# Patient Record
Sex: Male | Born: 1960 | Race: White | Hispanic: No | Marital: Married
Health system: Southern US, Academic
[De-identification: ages and names within clinical notes are randomized; demographics above are authoritative.]

## PROBLEM LIST (undated history)

## (undated) ENCOUNTER — Encounter

## (undated) ENCOUNTER — Encounter: Attending: Adult Health | Primary: Adult Health

## (undated) ENCOUNTER — Telehealth

## (undated) ENCOUNTER — Ambulatory Visit: Payer: PRIVATE HEALTH INSURANCE

## (undated) ENCOUNTER — Ambulatory Visit: Payer: BLUE CROSS/BLUE SHIELD

## (undated) ENCOUNTER — Encounter
Attending: Pharmacist Clinician (PhC)/ Clinical Pharmacy Specialist | Primary: Pharmacist Clinician (PhC)/ Clinical Pharmacy Specialist

## (undated) ENCOUNTER — Ambulatory Visit

## (undated) ENCOUNTER — Encounter: Attending: Hematology | Primary: Hematology

## (undated) ENCOUNTER — Telehealth: Attending: Oncology | Primary: Oncology

## (undated) ENCOUNTER — Telehealth: Attending: Adult Health | Primary: Adult Health

## (undated) ENCOUNTER — Encounter: Attending: Nurse Practitioner | Primary: Nurse Practitioner

## (undated) ENCOUNTER — Telehealth: Attending: Hematology | Primary: Hematology

## (undated) ENCOUNTER — Encounter: Attending: Primary Care | Primary: Primary Care

## (undated) ENCOUNTER — Ambulatory Visit: Payer: MEDICARE | Attending: Hematology | Primary: Hematology

## (undated) ENCOUNTER — Encounter: Attending: Clinical | Primary: Clinical

## (undated) ENCOUNTER — Ambulatory Visit: Payer: MEDICARE

## (undated) ENCOUNTER — Ambulatory Visit: Attending: Clinical | Primary: Clinical

## (undated) ENCOUNTER — Non-Acute Institutional Stay: Payer: PRIVATE HEALTH INSURANCE

## (undated) ENCOUNTER — Ambulatory Visit: Payer: MEDICARE | Attending: Adult Health | Primary: Adult Health

## (undated) ENCOUNTER — Encounter: Attending: Psychiatry | Primary: Psychiatry

## (undated) ENCOUNTER — Telehealth: Attending: Primary Care | Primary: Primary Care

## (undated) ENCOUNTER — Telehealth
Attending: Student in an Organized Health Care Education/Training Program | Primary: Student in an Organized Health Care Education/Training Program

## (undated) ENCOUNTER — Telehealth
Attending: Pharmacist Clinician (PhC)/ Clinical Pharmacy Specialist | Primary: Pharmacist Clinician (PhC)/ Clinical Pharmacy Specialist

## (undated) ENCOUNTER — Ambulatory Visit: Attending: Hematology | Primary: Hematology

## (undated) ENCOUNTER — Encounter: Attending: Oncology | Primary: Oncology

## (undated) ENCOUNTER — Ambulatory Visit: Payer: PRIVATE HEALTH INSURANCE | Attending: Adult Health | Primary: Adult Health

## (undated) ENCOUNTER — Inpatient Hospital Stay: Payer: MEDICARE

## (undated) ENCOUNTER — Encounter: Payer: BLUE CROSS/BLUE SHIELD | Attending: Adult Health | Primary: Adult Health

## (undated) ENCOUNTER — Non-Acute Institutional Stay: Payer: BLUE CROSS/BLUE SHIELD

## (undated) ENCOUNTER — Encounter: Attending: Critical Care Medicine | Primary: Critical Care Medicine

## (undated) ENCOUNTER — Ambulatory Visit: Payer: PRIVATE HEALTH INSURANCE | Attending: Hematology | Primary: Hematology

## (undated) ENCOUNTER — Encounter: Attending: Radiation Oncology | Primary: Radiation Oncology

## (undated) ENCOUNTER — Ambulatory Visit
Payer: PRIVATE HEALTH INSURANCE | Attending: Student in an Organized Health Care Education/Training Program | Primary: Student in an Organized Health Care Education/Training Program

## (undated) ENCOUNTER — Encounter: Payer: BLUE CROSS/BLUE SHIELD | Attending: Hematology | Primary: Hematology

## (undated) ENCOUNTER — Encounter: Payer: PRIVATE HEALTH INSURANCE | Attending: Primary Care | Primary: Primary Care

## (undated) ENCOUNTER — Ambulatory Visit: Attending: Family | Primary: Family

## (undated) ENCOUNTER — Telehealth: Attending: Physician Assistant | Primary: Physician Assistant

## (undated) HISTORY — PX: HERNIA REPAIR: SHX51

## (undated) HISTORY — PX: HEMORROIDECTOMY: SUR656

## (undated) MED ORDER — CALCIUM CARBONATE 600 MG CALCIUM (1,500 MG) TABLET: Freq: Two times a day (BID) | ORAL | 0 days

## (undated) MED ORDER — PREGABALIN 75 MG CAPSULE: capsule | 0 refills | 0 days

## (undated) MED ORDER — MULTIVITAMIN TABLET: Freq: Every day | ORAL | 0 days

---

## 2005-03-29 ENCOUNTER — Encounter: Admission: RE | Admit: 2005-03-29 | Discharge: 2005-03-29 | Payer: Self-pay | Admitting: Cardiology

## 2005-04-18 ENCOUNTER — Encounter: Admission: RE | Admit: 2005-04-18 | Discharge: 2005-04-18 | Payer: Self-pay | Admitting: Cardiology

## 2005-11-08 DIAGNOSIS — F411 Generalized anxiety disorder: Secondary | ICD-10-CM | POA: Insufficient documentation

## 2006-05-20 HISTORY — PX: ELBOW SURGERY: SHX618

## 2007-05-13 ENCOUNTER — Emergency Department: Payer: Self-pay | Admitting: Emergency Medicine

## 2008-08-25 ENCOUNTER — Encounter: Admission: RE | Admit: 2008-08-25 | Discharge: 2008-08-25 | Payer: Self-pay | Admitting: Cardiology

## 2009-02-16 ENCOUNTER — Ambulatory Visit: Payer: Self-pay

## 2009-06-23 DIAGNOSIS — M5417 Radiculopathy, lumbosacral region: Secondary | ICD-10-CM | POA: Insufficient documentation

## 2010-03-29 ENCOUNTER — Ambulatory Visit: Payer: Self-pay | Admitting: Urology

## 2010-04-05 ENCOUNTER — Ambulatory Visit: Payer: Self-pay | Admitting: Urology

## 2010-05-20 HISTORY — PX: LUMBAR LAMINECTOMY: SHX95

## 2010-07-07 ENCOUNTER — Emergency Department: Payer: Self-pay | Admitting: Emergency Medicine

## 2011-06-21 LAB — DRUG SCREEN, URINE
Amphetamines, Ur Screen: NEGATIVE (ref ?–1000)
Barbiturates, Ur Screen: NEGATIVE (ref ?–200)
Benzodiazepine, Ur Scrn: POSITIVE (ref ?–200)
Cannabinoid 50 Ng, Ur ~~LOC~~: NEGATIVE (ref ?–50)
Cocaine Metabolite,Ur ~~LOC~~: NEGATIVE (ref ?–300)
MDMA (Ecstasy)Ur Screen: POSITIVE (ref ?–500)
Methadone, Ur Screen: NEGATIVE (ref ?–300)
Opiate, Ur Screen: POSITIVE (ref ?–300)
Phencyclidine (PCP) Ur S: NEGATIVE (ref ?–25)
Tricyclic, Ur Screen: POSITIVE (ref ?–1000)

## 2011-06-21 LAB — CBC
HCT: 49.1 % (ref 40.0–52.0)
HGB: 16.6 g/dL (ref 13.0–18.0)
MCH: 32.3 pg (ref 26.0–34.0)
MCHC: 33.8 g/dL (ref 32.0–36.0)
MCV: 96 fL (ref 80–100)
Platelet: 162 10*3/uL (ref 150–440)
RBC: 5.14 10*6/uL (ref 4.40–5.90)
RDW: 13.3 % (ref 11.5–14.5)
WBC: 7.8 10*3/uL (ref 3.8–10.6)

## 2011-06-21 LAB — URINALYSIS, COMPLETE
Bacteria: NONE SEEN
Bilirubin,UR: NEGATIVE
Blood: NEGATIVE
Glucose,UR: NEGATIVE mg/dL (ref 0–75)
Ketone: NEGATIVE
Leukocyte Esterase: NEGATIVE
Nitrite: NEGATIVE
Ph: 6 (ref 4.5–8.0)
Protein: NEGATIVE
RBC,UR: 3 /HPF (ref 0–5)
Specific Gravity: 1.024 (ref 1.003–1.030)
Squamous Epithelial: NONE SEEN
WBC UR: <1 /HPF (ref 0–5)

## 2011-06-21 LAB — COMPREHENSIVE METABOLIC PANEL
Albumin: 3.8 g/dL (ref 3.4–5.0)
Alkaline Phosphatase: 77 U/L (ref 50–136)
Anion Gap: 8 (ref 7–16)
BUN: 25 mg/dL — ABNORMAL HIGH (ref 7–18)
Bilirubin,Total: 0.2 mg/dL (ref 0.2–1.0)
Calcium, Total: 8.5 mg/dL (ref 8.5–10.1)
Chloride: 105 mmol/L (ref 98–107)
Co2: 29 mmol/L (ref 21–32)
Creatinine: 0.88 mg/dL (ref 0.60–1.30)
EGFR (African American): >60
EGFR (Non-African Amer.): >60
Glucose: 126 mg/dL — ABNORMAL HIGH (ref 65–99)
Osmolality: 289 (ref 275–301)
Potassium: 3.8 mmol/L (ref 3.5–5.1)
SGOT(AST): 23 U/L (ref 15–37)
SGPT (ALT): 24 U/L
Sodium: 142 mmol/L (ref 136–145)
Total Protein: 6.6 g/dL (ref 6.4–8.2)

## 2011-06-21 LAB — PROTIME-INR
INR: 1
Prothrombin Time: 13.5 secs (ref 11.5–14.7)

## 2011-06-21 LAB — ETHANOL
Ethanol %: <0.003 % (ref 0.000–0.080)
Ethanol: <3 mg/dL

## 2011-06-21 LAB — ACETAMINOPHEN LEVEL: Acetaminophen: <2 ug/mL

## 2011-06-21 LAB — TROPONIN I: Troponin-I: <0.02 ng/mL

## 2011-06-22 ENCOUNTER — Inpatient Hospital Stay: Payer: Self-pay | Admitting: Psychiatry

## 2011-11-22 ENCOUNTER — Emergency Department (HOSPITAL_COMMUNITY)
Admission: EM | Admit: 2011-11-22 | Discharge: 2011-11-22 | Disposition: A | Discharge status: Home / Self Care | Payer: 59 | Attending: Emergency Medicine | Admitting: Emergency Medicine

## 2011-11-22 ENCOUNTER — Encounter (HOSPITAL_COMMUNITY): Payer: Self-pay | Admitting: *Deleted

## 2011-11-22 DIAGNOSIS — Z0000 Encounter for general adult medical examination without abnormal findings: Secondary | ICD-10-CM | POA: Insufficient documentation

## 2011-11-22 DIAGNOSIS — F1910 Other psychoactive substance abuse, uncomplicated: Secondary | ICD-10-CM

## 2011-11-22 LAB — URINALYSIS, ROUTINE W REFLEX MICROSCOPIC
Bilirubin Urine: NEGATIVE
Glucose, UA: NEGATIVE mg/dL
Ketones, ur: NEGATIVE mg/dL
Leukocytes, UA: NEGATIVE
Nitrite: NEGATIVE
Protein, ur: NEGATIVE mg/dL
Specific Gravity, Urine: 1.024 (ref 1.005–1.030)
Urobilinogen, UA: 0.2 mg/dL (ref 0.0–1.0)
pH: 6 (ref 5.0–8.0)

## 2011-11-22 LAB — BASIC METABOLIC PANEL
BUN: 16 mg/dL (ref 6–23)
Calcium: 9.3 mg/dL (ref 8.4–10.5)
Chloride: 104 mEq/L (ref 96–112)
Creatinine, Ser: 0.82 mg/dL (ref 0.50–1.35)
GFR calc Af Amer: >90 mL/min (ref >90)
GFR calc non Af Amer: >90 mL/min (ref >90)
Glucose, Bld: 107 mg/dL — ABNORMAL HIGH (ref 70–99)
Potassium: 3.8 mEq/L (ref 3.5–5.1)
Sodium: 141 mEq/L (ref 135–145)

## 2011-11-22 LAB — RAPID URINE DRUG SCREEN, HOSP PERFORMED
Amphetamines: NOT DETECTED
Barbiturates: NOT DETECTED
Benzodiazepines: POSITIVE — AB

## 2011-11-22 LAB — ETHANOL: Alcohol, Ethyl (B): <11 mg/dL (ref 0–11)

## 2011-11-22 LAB — CBC
HCT: 50 % (ref 39.0–52.0)
MCHC: 35.6 g/dL (ref 30.0–36.0)
MCV: 94.5 fL (ref 78.0–100.0)
Platelets: 215 10*3/uL (ref 150–400)
RBC: 5.29 MIL/uL (ref 4.22–5.81)
RDW: 12.8 % (ref 11.5–15.5)
WBC: 11.4 10*3/uL — ABNORMAL HIGH (ref 4.0–10.5)

## 2011-11-22 LAB — URINE MICROSCOPIC-ADD ON

## 2011-11-22 MED ORDER — ZOLPIDEM TARTRATE 5 MG PO TABS: 5.0000 mg | ORAL_TABLET | Freq: Every evening | ORAL | Status: DC | PRN

## 2011-11-22 MED ORDER — ONDANSETRON HCL 8 MG PO TABS: 4.0000 mg | ORAL_TABLET | Freq: Three times a day (TID) | ORAL | Status: DC | PRN

## 2011-11-22 MED ORDER — ACETAMINOPHEN 325 MG PO TABS: 650.0000 mg | ORAL_TABLET | ORAL | Status: DC | PRN

## 2011-11-22 MED ORDER — IBUPROFEN 400 MG PO TABS: 600.0000 mg | ORAL_TABLET | Freq: Three times a day (TID) | ORAL | Status: DC | PRN

## 2011-11-22 MED ORDER — NICOTINE 21 MG/24HR TD PT24: 21.0000 mg | MEDICATED_PATCH | Freq: Every day | TRANSDERMAL | Status: DC | PRN

## 2011-11-22 NOTE — ED Notes (Signed)
Patient states he is ready to go home and resume treatment through AA and will attend a meeting this evening. EDP advised and patient now waiting discharge.

## 2011-11-22 NOTE — ED Provider Notes (Signed)
History     CSN: [id number]  Arrival date & time 11/22/11  0213   First MD Initiated Contact with Patient 11/22/11 [phone]      Chief Complaint  Patient presents with  . medical clearance     (Consider location/radiation/quality/duration/timing/severity/associated sxs/prior treatment) HPI Comments: Cameron Bennett is a 51 y.o. Male presents with family members who are encouraging him to get help for use of illicit substances. Patient admits to using Xanax and opiates that he gets off from friends. He denies SI or HI. Tonight he got in an argument with his wife, place, were called to the house; after which he agreed to come to the hospital for evaluation. He has not recently been in detox program. He was to continue working. He will continue a short term treatment program. He does not know the amounts of benzos or opiates, that he uses. He denies weakness, dizziness, nausea, vomiting, cough, shortness of breath, or chest pain.  The history is provided by the patient.    History reviewed. No pertinent past medical history.  History reviewed. No pertinent past surgical history.  No family history on file.  History  Substance Use Topics  . Smoking status: Current Everyday Smoker  . Smokeless tobacco: Not on file  . Alcohol Use: Yes      Review of Systems  All other systems reviewed and are negative.    Allergies  Penicillins and Sulfa antibiotics  Home Medications   Current Outpatient Rx  Name Route Sig Dispense Refill  . ALPRAZOLAM 2 MG PO TABS Oral Take 2 mg by mouth 3 (three) times daily as needed. For anxiety    . EXCEDRIN PO Oral Take 1 tablet by mouth every 6 (six) hours as needed. For pain    . IBUPROFEN 200 MG PO TABS Oral Take 200 mg by mouth every 6 (six) hours as needed. For pain      BP 133/94  Pulse 96  Temp 98.2 F (36.8 C) (Oral)  Resp 18  SpO2 95%  Physical Exam  Nursing note and vitals reviewed. Constitutional: He is oriented to person, place,  and time. He appears well-developed and well-nourished.  HENT:  Head: Normocephalic and atraumatic.  Right Ear: External ear normal.  Left Ear: External ear normal.  Eyes: Conjunctivae and EOM are normal. Pupils are equal, round, and reactive to light.  Neck: Normal range of motion and phonation normal. Neck supple.  Cardiovascular: Normal rate, regular rhythm, normal heart sounds and intact distal pulses.   Pulmonary/Chest: Effort normal. He exhibits no bony tenderness.  Abdominal: Normal appearance.  Musculoskeletal: Normal range of motion.  Neurological: He is alert and oriented to person, place, and time. He has normal strength. No cranial nerve deficit or sensory deficit. He exhibits normal muscle tone. Coordination normal.       Slurred speech, consistent with intoxication.  Skin: Skin is warm, dry and intact.  Psychiatric: He has a normal mood and affect. His behavior is normal.    ED Course  Procedures (including critical care time)  Labs Reviewed  URINALYSIS, ROUTINE W REFLEX MICROSCOPIC - Abnormal; Notable for the following:    Hgb urine dipstick SMALL (*)     All other components within normal limits  URINE RAPID DRUG SCREEN (HOSP PERFORMED) - Abnormal; Notable for the following:    Opiates POSITIVE (*)     Benzodiazepines POSITIVE (*)     All other components within normal limits  CBC - Abnormal; Notable for the following:  WBC 11.4 (*)     Hemoglobin 17.8 (*)     All other components within normal limits  BASIC METABOLIC PANEL - Abnormal; Notable for the following:    Glucose, Bld 107 (*)     All other components within normal limits  URINE MICROSCOPIC-ADD ON  ETHANOL  LAB REPORT - SCANNED   No results found.   1. Polysubstance abuse       MDM  Polysubstance abuse.        Flint Melter, MD 11/30/11 [phone]
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
## 2011-11-22 NOTE — ED Notes (Signed)
The pt says he is here for a drug screen.  He took a xanax and he  Says his family wanted him to go to behavorial health.  There seems to be more to the story the pt is not telling

## 2011-11-22 NOTE — ED Notes (Signed)
Patient is sleeping at present 

## 2011-11-22 NOTE — ED Provider Notes (Signed)
Pt seen and examined in Pod C, vitals are stable.  Pt without complaints this morning, awaiting disposition per ACT team.   12:22 PM  Pt now requesting to leave ED.  He is here voluntarily.  He states he has an AA meeting tonight at 8pm that he will attend.  Discharged with strict return precautions.    Ethelda Chick, MD 11/22/11 [phone]
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
## 2011-11-26 ENCOUNTER — Ambulatory Visit: Payer: Self-pay | Admitting: Unknown Physician Specialty

## 2011-11-26 LAB — DRUG SCREEN, URINE

## 2011-12-19 ENCOUNTER — Ambulatory Visit: Payer: Self-pay | Admitting: Unknown Physician Specialty

## 2012-01-02 LAB — RAPID URINE DRUG SCREEN, HOSP PERFORMED
Amphetamines, Ur Screen: NEGATIVE (ref ?–1000)
Barbiturates, Ur Screen: NEGATIVE (ref ?–200)
Benzodiazepine, Ur Scrn: NEGATIVE (ref ?–200)
Cannabinoid 50 Ng, Ur ~~LOC~~: NEGATIVE (ref ?–50)
Cocaine Metabolite,Ur ~~LOC~~: NEGATIVE (ref ?–300)
Opiate, Ur Screen: NEGATIVE (ref ?–300)

## 2012-01-19 ENCOUNTER — Ambulatory Visit: Payer: Self-pay | Admitting: Unknown Physician Specialty

## 2013-05-01 ENCOUNTER — Ambulatory Visit: Payer: Self-pay | Admitting: Family Medicine

## 2013-05-22 ENCOUNTER — Ambulatory Visit: Payer: Self-pay | Admitting: Family Medicine

## 2013-08-12 ENCOUNTER — Ambulatory Visit: Payer: Self-pay | Admitting: Family Medicine

## 2014-03-28 ENCOUNTER — Ambulatory Visit: Payer: Self-pay | Admitting: Gastroenterology

## 2014-03-28 LAB — HM COLONOSCOPY
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
## 2014-09-11 NOTE — Discharge Summary (Signed)
PATIENT NAME:  Cameron Bennett, Cameron Bennett MR#:  [id number] DATE OF BIRTH:  22-Jun-1960  DATE OF ADMISSION:  06/22/2011 DATE OF DISCHARGE:  06/24/2011  HISTORY OF PRESENT ILLNESS: Mr. Casey Fye is a 54 year old male who has had great difficulty with substance dependence. His wife has been very distressed about his condition. He was showing some delirium symptoms on February 2nd. He acknowledged that he had been using opioids to the point that he had been getting hydrocodone off the streets. This had progressed from his initial treatment with opioids for pain secondary to his back surgery in April of 2012.   His wife brought him to the Emergency Room. When he was first assessed, he was displaying incoherence and poor concentration with poor judgment, slurred speech, restlessness, and anxious affect.   ANCILLARY CLINICAL DATA: Mr. Almond underwent a head CT which was negative. He also underwent a chest x-ray which was negative.   His urine drug screen was positive for benzodiazepines, opiates, MDMA, as well as TCA. His urinalysis was negative. Chest CT was unremarkable. Aspirin negative. Tylenol negative. Ethanol negative. His complete metabolic panel was unremarkable. His CBC was unremarkable.   HOSPITAL COURSE: Mr. Daywalt was admitted to the Inpatient Behavioral Unit for further evaluation and treatment of opioid dependence as well as anxiety. He had been having insomnia as well as muscle tension and feeling on edge. He had been receiving benzodiazepines. Trazodone was started at 50 mg at bedtime which was not resolving the insomnia. The undersigned increased it to 100 mg.   Mr. Pasternak quickly overcame his thought disorganization and confusion by the time the admitting physician assessed him. He did not require any treatment for opioid withdrawal. He was continued on his Chantix 1 mg b.i.d. to prevent return to smoking cigarettes. He was also kept on his bupropion 150 mg b.i.d. Mr. Lehigh reported that  the Bupropion and Chantix were being used to prevent his return to cigarette usage.   CONDITION ON DISCHARGE: By February 4th, Mr. Bostwick is showing normal mood, interest, energy, concentration, and appetite. He is behaving normally within the milieu and is attending groups. He does report some residual insomnia after 50 mg of trazodone at bedtime and is interested in increasing the trazodone.   MENTAL STATUS EXAM UPON DISCHARGE: Mr. Rosato is a well developed, well nourished middle-aged male with normal gait. He has no cachexia. He has normal muscle tone. He has normal grooming and hygiene.   Eye contact is good. Concentration is normal. He is oriented to all spheres. Memory is intact to immediate, recent, and remote except for the blackout during the delirium. His use of language, intelligence, and fund of knowledge are normal. Abstraction is normal. Speech involves normal rate and prosody without dysarthria.   Thought process is logical, coherent, and goal directed without looseness of associations. There are no tangents. Thought content no thoughts of harming himself, no thoughts of harming others. No delusions. No hallucinations. Insight is intact. Judgment is intact. Mood within normal limits. Affect broad and appropriate.   DISCHARGE DIAGNOSES:  AXIS I:  1. Anxiety disorder, not otherwise specified.  2. Delirium secondary to polysubstance intoxication, resolved.  3. Opioid dependence.  4. Nicotine dependence.   AXIS II: Deferred.   AXIS III:  1. History of hernia repair. 2. Carpal tunnel syndrome repair, right side. 3. History of back surgery.   AXIS IV: Primary support group, general medical.   AXIS V: 55.   Mr. Brockbank is not at risk to harm  himself or others. He agrees to call emergency services for any thoughts of harming himself, thoughts of harming others, or distress.   DIET: Regular.   ACTIVITY: Routine.   DISCHARGE MEDICATIONS: The indications, alternatives, and  adverse effects of trazodone were reviewed with Mr. Pillsbury including the risk of priapism resulting in surgery-induced impotence. He understands and wants to continue the trazodone with an increase to 100 mg at bedtime for anti-insomnia as well as antianxiety. The undersigned will prescribe trazodone 100 mg 1 p.o. at bedtime, dispense #12, enough to carry him over to his Intensive Outpatient Program. He agrees to not drive if drowsy.   He has a supply of his bupropion 150 mg b.i.d. as well as his Chantix 1 mg b.i.d.   FOLLOW-UP APPOINTMENTS: [facility] to start Intensive Outpatient Program in the morning.   Twelve-step method and groups.  ____________________________ Drue Stager. Ahsha Hinsley, MD [id number] D: 06/24/2011 17:38:11 ET T: 06/25/2011 12:07:45 ET JOB#: [id number]  cc: Drue Stager. Tedi Hughson, MD, <Dictator> Billie Ruddy MD ELECTRONICALLY SIGNED 07/05/2011 20:00

## 2014-10-11 ENCOUNTER — Other Ambulatory Visit: Payer: Self-pay | Admitting: Family Medicine

## 2014-10-11 ENCOUNTER — Ambulatory Visit
Admission: RE | Admit: 2014-10-11 | Discharge: 2014-10-11 | Disposition: A | Discharge status: Home / Self Care | Payer: BLUE CROSS/BLUE SHIELD | Source: Ambulatory Visit | Attending: Family Medicine | Admitting: Family Medicine

## 2014-10-11 DIAGNOSIS — R042 Hemoptysis: Secondary | ICD-10-CM

## 2014-10-11 DIAGNOSIS — J449 Chronic obstructive pulmonary disease, unspecified: Secondary | ICD-10-CM | POA: Insufficient documentation

## 2014-10-11 DIAGNOSIS — R05 Cough: Secondary | ICD-10-CM | POA: Diagnosis present

## 2014-10-11 DIAGNOSIS — R059 Cough, unspecified: Secondary | ICD-10-CM

## 2014-10-13 ENCOUNTER — Other Ambulatory Visit: Payer: Self-pay | Admitting: Family Medicine

## 2014-10-13 DIAGNOSIS — R042 Hemoptysis: Secondary | ICD-10-CM

## 2014-10-19 ENCOUNTER — Ambulatory Visit
Admission: RE | Admit: 2014-10-19 | Discharge: 2014-10-19 | Disposition: A | Discharge status: Home / Self Care | Payer: BLUE CROSS/BLUE SHIELD | Source: Ambulatory Visit | Attending: Family Medicine | Admitting: Family Medicine

## 2014-10-19 DIAGNOSIS — R042 Hemoptysis: Secondary | ICD-10-CM | POA: Diagnosis not present

## 2014-10-19 MED ORDER — IOHEXOL 350 MG/ML SOLN
75.0000 mL | Freq: Once | INTRAVENOUS | Status: AC | PRN
  Administered 2014-10-19: 75 mL via INTRAVENOUS

## 2014-10-20 ENCOUNTER — Telehealth: Payer: Self-pay | Admitting: Family Medicine

## 2014-10-20 NOTE — Telephone Encounter (Signed)
Pt called to requset results from CT scan.  CB#[id number]

## 2014-10-20 NOTE — Telephone Encounter (Signed)
Pt called to results of CT scan.  CB#[phone]/[id number]

## 2014-10-21 NOTE — Telephone Encounter (Signed)
No significant abnormality on chest CT scan. Schedule follow up in 2-3 weeks for spirometry.

## 2014-10-21 NOTE — Telephone Encounter (Signed)
Patient advised as directed below. Patient verbalized understanding. Patient scheduled a follow up appointment for spirometry. NW

## 2014-11-02 ENCOUNTER — Other Ambulatory Visit: Payer: Self-pay

## 2014-11-02 DIAGNOSIS — G8929 Other chronic pain: Secondary | ICD-10-CM | POA: Insufficient documentation

## 2014-11-02 DIAGNOSIS — Z87442 Personal history of urinary calculi: Secondary | ICD-10-CM | POA: Insufficient documentation

## 2014-11-02 DIAGNOSIS — M549 Dorsalgia, unspecified: Secondary | ICD-10-CM

## 2014-11-02 DIAGNOSIS — H6980 Other specified disorders of Eustachian tube, unspecified ear: Secondary | ICD-10-CM | POA: Insufficient documentation

## 2014-11-02 DIAGNOSIS — H6990 Unspecified Eustachian tube disorder, unspecified ear: Secondary | ICD-10-CM | POA: Insufficient documentation

## 2014-11-02 DIAGNOSIS — Z9889 Other specified postprocedural states: Secondary | ICD-10-CM | POA: Insufficient documentation

## 2014-11-02 DIAGNOSIS — E669 Obesity, unspecified: Secondary | ICD-10-CM | POA: Insufficient documentation

## 2014-11-02 DIAGNOSIS — F1911 Other psychoactive substance abuse, in remission: Secondary | ICD-10-CM | POA: Insufficient documentation

## 2014-11-02 DIAGNOSIS — G479 Sleep disorder, unspecified: Secondary | ICD-10-CM | POA: Insufficient documentation

## 2014-11-02 DIAGNOSIS — E291 Testicular hypofunction: Secondary | ICD-10-CM | POA: Insufficient documentation

## 2014-11-04 ENCOUNTER — Encounter: Payer: Self-pay | Admitting: Family Medicine

## 2014-11-04 ENCOUNTER — Ambulatory Visit (INDEPENDENT_AMBULATORY_CARE_PROVIDER_SITE_OTHER): Payer: BLUE CROSS/BLUE SHIELD | Admitting: Family Medicine

## 2014-11-04 ENCOUNTER — Other Ambulatory Visit: Payer: Self-pay

## 2014-11-04 VITALS — BP 124/86 | HR 70 | Temp 98.6°F | Resp 18 | Wt 184.8 lb

## 2014-11-04 DIAGNOSIS — J449 Chronic obstructive pulmonary disease, unspecified: Secondary | ICD-10-CM

## 2014-11-04 DIAGNOSIS — J40 Bronchitis, not specified as acute or chronic: Secondary | ICD-10-CM

## 2014-11-04 NOTE — Progress Notes (Signed)
Subjective:    Patient ID: Cameron Bennett, male    DOB: 08-10-1960, 54 y.o.   MRN: [id number]  Chief Complaint  Patient presents with  . Follow-up   HPI Bronchitis greatly improved. CT scan on 10-19-14 did not show masses or significant fibrotic scar. Finished the Doxycycline and Mucinex. Rarely uses the Symbicort now because it made him feel "weird". Still working on smoking cessation by tapering back for now.   Patient Active Problem List   Diagnosis Date Noted  . Back pain, chronic 11/02/2014  . Dysfunction of eustachian tube 11/02/2014  . H/O drug abuse 11/02/2014  . History of surgery to major organs, presenting hazards to health 11/02/2014  . Personal history of urinary calculi 11/02/2014  . 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 Adiposity 11/02/2014  . Dyssomnia 11/02/2014  . 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 Anxiety, generalized 11/08/2005    Past Surgical History  Procedure Laterality Date  . Elbow surgery Right 2008  . Lumbar laminectomy  2012  . Hemorroidectomy    . Hernia repair      History  Substance Use Topics  . Smoking status: Current Every Day Smoker  . Smokeless tobacco: Not on file  . Alcohol Use: No   Family History  Problem Relation Age of Onset  . COPD Mother   . Lung cancer Father   . Diabetes Sister   . Diabetes Brother    Current Outpatient Prescriptions on File Prior to Visit  Medication Sig Dispense Refill  . Aspirin-Acetaminophen-Caffeine (EXCEDRIN PO) Take 1 tablet by mouth every 6 (six) hours as needed. For pain    . gabapentin (NEURONTIN) 300 MG capsule Take 1 capsule by mouth 3 (three) times daily.    Marland Kitchen ibuprofen (ADVIL,MOTRIN) 200 MG tablet Take 200 mg by mouth every 6 (six) hours as needed. For pain    . MULTIPLE VITAMIN PO Take 1 tablet by mouth daily.     No current facility-administered medications on file prior to visit.     Allergies  Allergen Reactions  . Penicillins Swelling  . Sulfa Antibiotics Rash    Review of  Systems  Constitutional: Negative.   HENT: Negative.   Eyes: Negative.   Respiratory: Negative for shortness of breath and wheezing.        Still some cough in the mornings and still smoking.   Cardiovascular: Negative.   Gastrointestinal: Negative.       Objective:   Physical Exam  Constitutional: He is oriented to person, place, and time. He appears well-developed and well-nourished. No distress.  HENT:  Head: Normocephalic and atraumatic.  Right Ear: Hearing normal.  Left Ear: Hearing normal.  Nose: Nose normal.  Eyes: Conjunctivae and lids are normal. Right eye exhibits no discharge. Left eye exhibits no discharge. No scleral icterus.  Pulmonary/Chest: Effort normal. No respiratory distress.  Musculoskeletal: Normal range of motion.  Neurological: He is alert and oriented to person, place, and time.  Skin: Skin is intact. No lesion and no rash noted.  Psychiatric: He has a normal mood and affect. His speech is normal and behavior is normal. Thought content normal.    BP 124/86 mmHg  Pulse 70  Temp(Src) 98.6 F (37 C) (Oral)  Resp 18  Wt 184 lb 12.8 oz (83.825 kg)     Assessment & Plan:  1. Chronic obstructive pulmonary disease, unspecified COPD, unspecified chronic bronchitis type Still smoking but working on cessation program. No significant dyspnea now. Treated bronchitis exacerbation with antibiotic and Symbicort inhaler.  Spirometry reported as normal. CT scan of lungs did not show masses or significant fibrosis. Encouraged to stop all smoking. - Spirometry with graph  2. Bronchitis No wheezing or significant congestion since treatment with Doxycycline, Mucinex and Symbicort inhaler. Normal spirometry. Recheck prn. May stop the Symbicort for now. - Spirometry with graph

## 2015-01-12 ENCOUNTER — Emergency Department
Admission: EM | Admit: 2015-01-12 | Discharge: 2015-01-12 | Disposition: A | Discharge status: Home / Self Care | Payer: BLUE CROSS/BLUE SHIELD

## 2015-01-12 ENCOUNTER — Encounter: Payer: Self-pay | Admitting: Emergency Medicine

## 2015-01-12 ENCOUNTER — Emergency Department: Payer: Worker's Compensation

## 2015-01-12 ENCOUNTER — Emergency Department
Admission: EM | Admit: 2015-01-12 | Discharge: 2015-01-12 | Disposition: A | Discharge status: Home / Self Care | Payer: Worker's Compensation | Attending: Student | Admitting: Student

## 2015-01-12 DIAGNOSIS — Z79899 Other long term (current) drug therapy: Secondary | ICD-10-CM | POA: Insufficient documentation

## 2015-01-12 DIAGNOSIS — Y9389 Activity, other specified: Secondary | ICD-10-CM | POA: Insufficient documentation

## 2015-01-12 DIAGNOSIS — Y990 Civilian activity done for income or pay: Secondary | ICD-10-CM | POA: Diagnosis not present

## 2015-01-12 DIAGNOSIS — S67190A Crushing injury of right index finger, initial encounter: Secondary | ICD-10-CM | POA: Diagnosis not present

## 2015-01-12 DIAGNOSIS — Z880 Allergy status to penicillin: Secondary | ICD-10-CM | POA: Diagnosis not present

## 2015-01-12 DIAGNOSIS — S62609B Fracture of unspecified phalanx of unspecified finger, initial encounter for open fracture: Secondary | ICD-10-CM

## 2015-01-12 DIAGNOSIS — Y9259 Other trade areas as the place of occurrence of the external cause: Secondary | ICD-10-CM | POA: Diagnosis not present

## 2015-01-12 DIAGNOSIS — S6721XA Crushing injury of right hand, initial encounter: Secondary | ICD-10-CM

## 2015-01-12 DIAGNOSIS — S62620B Displaced fracture of medial phalanx of right index finger, initial encounter for open fracture: Secondary | ICD-10-CM | POA: Insufficient documentation

## 2015-01-12 DIAGNOSIS — S61210A Laceration without foreign body of right index finger without damage to nail, initial encounter: Secondary | ICD-10-CM | POA: Diagnosis present

## 2015-01-12 DIAGNOSIS — W231XXA Caught, crushed, jammed, or pinched between stationary objects, initial encounter: Secondary | ICD-10-CM | POA: Diagnosis not present

## 2015-01-12 DIAGNOSIS — Z720 Tobacco use: Secondary | ICD-10-CM | POA: Insufficient documentation

## 2015-01-12 DIAGNOSIS — S61219A Laceration without foreign body of unspecified finger without damage to nail, initial encounter: Secondary | ICD-10-CM

## 2015-01-12 LAB — CBC WITH DIFFERENTIAL/PLATELET
BASOS PCT: 1 %
Basophils Absolute: 0.1 10*3/uL (ref 0–0.1)
Eosinophils Absolute: 0 10*3/uL (ref 0–0.7)
Eosinophils Relative: 1 %
HEMATOCRIT: 47.8 % (ref 40.0–52.0)
HEMOGLOBIN: 16.5 g/dL (ref 13.0–18.0)
LYMPHS ABS: 1.7 10*3/uL (ref 1.0–3.6)
Lymphocytes Relative: 24 %
MCH: 32.5 pg (ref 26.0–34.0)
MCHC: 34.6 g/dL (ref 32.0–36.0)
MCV: 94 fL (ref 80.0–100.0)
MONOS PCT: 8 %
Monocytes Absolute: 0.6 10*3/uL (ref 0.2–1.0)
NEUTROS ABS: 4.7 10*3/uL (ref 1.4–6.5)
NEUTROS PCT: 66 %
Platelets: 139 10*3/uL — ABNORMAL LOW (ref 150–440)
RBC: 5.09 MIL/uL (ref 4.40–5.90)
RDW: 12.7 % (ref 11.5–14.5)
WBC: 7.2 10*3/uL (ref 3.8–10.6)

## 2015-01-12 MED ORDER — LIDOCAINE HCL (PF) 1 % IJ SOLN
5.0000 mL | Freq: Once | INTRAMUSCULAR | Status: AC
  Administered 2015-01-12: 5 mL

## 2015-01-12 MED ORDER — LIDOCAINE HCL (PF) 1 % IJ SOLN
5.0000 mL | Freq: Once | INTRAMUSCULAR | Status: AC
  Administered 2015-01-12: 5 mL
  Filled 2015-01-12: qty 5

## 2015-01-12 MED ORDER — CEPHALEXIN 500 MG PO CAPS: 500.0000 mg | ORAL_CAPSULE | Freq: Four times a day (QID) | ORAL | Status: DC

## 2015-01-12 MED ORDER — CEFAZOLIN SODIUM 1-5 GM-% IV SOLN
1.0000 g | Freq: Once | INTRAVENOUS | Status: AC
  Administered 2015-01-12: 1 g via INTRAVENOUS
  Filled 2015-01-12: qty 50

## 2015-01-12 MED ORDER — LIDOCAINE HCL (PF) 1 % IJ SOLN
INTRAMUSCULAR | Status: AC
  Administered 2015-01-12: 5 mL
  Filled 2015-01-12: qty 5

## 2015-01-12 NOTE — ED Notes (Signed)
Spoke with Pearson Grippe at [facility], informed her that since patient does not have his ID we cannot process the urine drug screen until ID is obtained or a representative from the company can identify him.  A man named Linna Hoff is going to bring patient's ID so that urine drug screen can be completed for [employer].

## 2015-01-12 NOTE — ED Notes (Signed)
Sent over form [place] with laceration to right index finger at work  Open fracture to index finger per staff at [employer] is bandaged on arrival

## 2015-01-12 NOTE — ED Notes (Signed)
Patient informed that a urine sample needs collecting, unable to urinate at this time, reports he just went right before he got here.

## 2015-01-12 NOTE — ED Notes (Signed)
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  Happened at work around 120pm today.  Was given Tetanus and 60mg  of Toradol at [facility].

## 2015-01-12 NOTE — ED Notes (Signed)
Friend states that pt is to be seen at [facility].

## 2015-01-12 NOTE — ED Provider Notes (Signed)
[facility] Emergency Department Provider Note ____________________________________________  Time seen: 1505  I have reviewed the triage vital signs and the nursing notes.  HISTORY  Chief Complaint  Laceration  HPI Cameron Bennett is a 54 y.o. male reports to the ED from [place]., with a work-related crush injury to his right index finger. 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 Today about 120 this afternoon. He did not pull his finger from under the press but instead waited for the cycle through. He noted immediate bleeding and large laceration to the right index finger. He reported to the [facility] where they performed an x-ray, tetanus booster, and 60 mg of Toradol IM. He was then referred here for management of the laceration and open fracture.he rates his pain currently at a 1/10 in triage.  History reviewed. No pertinent past medical history.  Patient Active Problem List   Diagnosis Date Noted  . Back pain, chronic 11/02/2014  . Dysfunction of eustachian tube 11/02/2014  . H/O drug abuse 11/02/2014  . History of surgery to major organs, presenting hazards to health 11/02/2014  . Personal history of urinary calculi 11/02/2014  . 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 Adiposity 11/02/2014  . Dyssomnia 11/02/2014  . 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 Anxiety, generalized 11/08/2005    Past Surgical History  Procedure Laterality Date  . Elbow surgery Right 2008  . Lumbar laminectomy  2012  . Hemorroidectomy    . Hernia repair      Current Outpatient Rx  Name  Route  Sig  Dispense  Refill  . Aspirin-Acetaminophen-Caffeine (EXCEDRIN PO)   Oral   Take 1 tablet by mouth every 6 (six) hours as needed. For pain         . cephALEXin (KEFLEX) 500 MG capsule   Oral   Take 1 capsule (500 mg total) by mouth 4 (four) times daily.   28 capsule   0   . gabapentin  (NEURONTIN) 300 MG capsule   Oral   Take 1 capsule by mouth 3 (three) times daily.         Marland Kitchen gabapentin (NEURONTIN) 400 MG capsule   Oral   Take 1,200 mg by mouth 2 (two) times daily.      2   . ibuprofen (ADVIL,MOTRIN) 200 MG tablet   Oral   Take 200 mg by mouth every 6 (six) hours as needed. For pain         . MULTIPLE VITAMIN PO   Oral   Take 1 tablet by mouth daily.          Allergies Penicillins and Sulfa antibiotics  Family History  Problem Relation Age of Onset  . COPD Mother   . Lung cancer Father   . Diabetes Sister   . Diabetes Brother    Social History Social History  Substance Use Topics  . Smoking status: Current Every Day Smoker  . Smokeless tobacco: None  . Alcohol Use: No   Review of Systems  Constitutional: Negative for fever. Eyes: Negative for visual changes. ENT: Negative for sore throat. Cardiovascular: Negative for chest pain. Respiratory: Negative for shortness of breath. Gastrointestinal: Negative for abdominal pain, vomiting and diarrhea. Genitourinary: Negative for dysuria. Musculoskeletal: Negative for back pain. Crush injury as above Skin: Negative for rash. Neurological: Negative for headaches, focal weakness or numbness. ____________________________________________  PHYSICAL EXAM:  VITAL SIGNS: ED Triage Vitals  Enc Vitals Group     BP 01/12/15 1455 136/81 mmHg     Pulse Rate 01/12/15 1455 94     Resp 01/12/15 1455 20     Temp 01/12/15 1455 98.7 F (37.1 C)     Temp Source 01/12/15 1455 Oral     SpO2 01/12/15 1455 99 %     Weight 01/12/15 1455 174 lb (78.926 kg)     Height 01/12/15 1455 5\' 8"  (1.727 m)     Head Cir --      Peak Flow --      Pain Score 01/12/15 1456 1     Pain Loc --      Pain Edu? --      Excl. in [place]? --    Constitutional: Alert and oriented. Well appearing and in no distress. Eyes: Conjunctivae are normal. PERRL. Normal extraocular movements. ENT   Head: Normocephalic and atraumatic.    Nose: No congestion/rhinnorhea.   Mouth/Throat: Mucous membranes are moist.   Neck: Supple. No thyromegaly. Hematological/Lymphatic/Immunilogical: No cervical lymphadenopathy. Cardiovascular: Normal rate, regular rhythm.  Respiratory: Normal respiratory effort. No wheezes/rales/rhonchi. Gastrointestinal: Soft and nontender. No distention. Musculoskeletal: Nontender with normal range of motion in all extremities.  Neurologic:  Normal gait without ataxia. Normal speech and language. No gross focal neurologic deficits are appreciated. Skin:  Skin is warm, dry and intact. No rash noted. Psychiatric: Mood and affect are normal. Patient exhibits appropriate insight and judgment. ____________________________________________   RADIOLOGY  Francene Finders X-ray (01/12/15) Comminuted fracture to the distal portion of the middle phalanx of the right index finger  I, Ariahna Smiddy, Dannielle Karvonen, personally viewed and evaluated these images (plain radiographs) as part of my medical decision making.  ____________________________________________  LACERATION REPAIR Performed by: Melvenia Needles Authorized by: Melvenia Needles Consent: Verbal consent obtained. Risks and benefits: risks, benefits and alternatives were discussed Consent given by: patient Patient identity confirmed: provided demographic data Prepped and Draped in normal sterile fashion Wound explored  Laceration Location: right index finger  Laceration Length: 4 cm  No Foreign Bodies seen or palpated  Anesthesia: digital block  Local anesthetic: lidocaine 1% w/o epinephrine  Anesthetic total: 4 ml  Irrigation method: syringe Amount of cleaning: standard  Skin closure: 4-0 nylon  Number of sutures: 8  Technique: interrupted  Patient tolerance: Patient tolerated the procedure well with no immediate complications. ________________________________________________________  PROCEDURES  Ceftin 1.5 g IVP Finger  splint ____________________________________________  INITIAL IMPRESSION / ASSESSMENT AND PLAN / ED COURSE  Open, comminuted fracture of the right index finger secondary to crush injury. Suture repair and initial fracture care provided. Spoke with Dr. Rudene Christians, he agrees with plan and will see the patient in the office on Monday.  ____________________________________________  FINAL CLINICAL IMPRESSION(S) / ED DIAGNOSES  Final diagnoses:  Open fracture of finger of right hand, initial encounter  Laceration of finger of right hand with complication, initial encounter  Crushing injury of finger of right hand, initial encounter     Melvenia Needles, PA-C 01/15/15 1958  Joanne Gavel, MD 01/16/15 2352

## 2015-01-12 NOTE — Discharge Instructions (Signed)
Crush Injury, Fingers or Toes A crush injury to the fingers or toes means the tissues have been damaged by being squeezed (compressed). There will be bleeding into the tissues and swelling. Often, blood will collect under the skin. When this happens, the skin on the finger often dies and may slough off (shed) 1 week to 10 days later. Usually, new skin is growing underneath. If the injury has been too severe and the tissue does not survive, the damaged tissue may begin to turn black over several days.  Wounds which occur because of the crushing may be stitched (sutured) shut. However, crush injuries are more likely to become infected than other injuries.These wounds may not be closed as tightly as other types of cuts to prevent infection. Nails involved are often lost. These usually grow back over several weeks.  DIAGNOSIS X-rays may be taken to see if there is any injury to the bones. TREATMENT Broken bones (fractures) may be treated with splinting, depending on the fracture. Often, no treatment is required for fractures of the last bone in the fingers or toes. HOME CARE INSTRUCTIONS   The crushed part should be raised (elevated) above the heart or center of the chest as much as possible for the first several days or as directed. This helps with pain and lessens swelling. Less swelling increases the chances that the crushed part will survive.  Put ice on the injured area.  Put ice in a plastic bag.  Place a towel between your skin and the bag.  Leave the ice on for 15-20 minutes, 03-04 times a day for the first 2 days.  Only take over-the-counter or prescription medicines for pain, discomfort, or fever as directed by your caregiver.  Use your injured part only as directed.  Change your bandages (dressings) as directed.  Keep all follow-up appointments as directed by your caregiver. Not keeping your appointment could result in a chronic or permanent injury, pain, and disability. If there is  any problem keeping the appointment, you must call to reschedule. SEEK IMMEDIATE MEDICAL CARE IF:   There is redness, swelling, or increasing pain in the wound area.  Pus is coming from the wound.  You have a fever.  You notice a bad smell coming from the wound or dressing.  The edges of the wound do not stay together after the sutures have been removed.  You are unable to move the injured finger or toe. MAKE SURE YOU:   Understand these instructions.  Will watch your condition.  Will get help right away if you are not doing well or get worse. Document Released: 05/06/2005 Document Revised: 07/29/2011 Document Reviewed: 09/21/2010 [facility] Patient Information 2015 [place], [place]. This information is not intended to replace advice given to you by your health care provider. Make sure you discuss any questions you have with your health care provider.  Finger Fracture A finger fracture is when one or more bones in the finger break.  HOME CARE   Wear the splint, tape, or cast as long as told by your doctor.  Keep your fingers in the position your doctor tell you to.  Raise (elevate) the injured area above the level of the heart.  Only take medicine as told by your doctor.  Put ice on the injured area.  Put ice in a plastic bag.  Place a towel between the skin and the bag.  Leave the ice on for 15-20 minutes, 03-04 times a day.  Follow up with your doctor.  Ask what exercises you can do when the splint comes off. GET HELP RIGHT AWAY IF:   The fingernails are white or bluish.  You have pain not helped by medicine.  You cannot move your fingertips.  You lose feeling (numbness) in the injured finger(s). MAKE SURE YOU:   Understand these instructions.  Will watch this condition.  Will get help right away if you are not doing well or get worse. Document Released: 10/23/2007 Document Revised: 07/29/2011 Document Reviewed: 10/23/2007 [facility] Patient Information  2015 [place], [place]. This information is not intended to replace advice given to you by your health care provider. Make sure you discuss any questions you have with your health care provider.  Laceration Care, Adult A laceration is a cut that goes through all layers of the skin. The cut goes into the tissue beneath the skin. HOME CARE For stitches (sutures) or staples:  Keep the cut clean and dry.  If you have a bandage (dressing), change it at least once a day. Change the bandage if it gets wet or dirty, or as told by your doctor.  Wash the cut with soap and water 2 times a day. Rinse the cut with water. Pat it dry with a clean towel.  Put a thin layer of medicated cream on the cut as told by your doctor.  You may shower after the first 24 hours. Do not soak the cut in water until the stitches are removed.  Only take medicines as told by your doctor.  Have your stitches or staples removed as told by your doctor. For skin adhesive strips:  Keep the cut clean and dry.  Do not get the strips wet. You may take a bath, but be careful to keep the cut dry.  If the cut gets wet, pat it dry with a clean towel.  The strips will fall off on their own. Do not remove the strips that are still stuck to the cut. For wound glue:  You may shower or take baths. Do not soak or scrub the cut. Do not swim. Avoid heavy sweating until the glue falls off on its own. After a shower or bath, pat the cut dry with a clean towel.  Do not put medicine on your cut until the glue falls off.  If you have a bandage, do not put tape over the glue.  Avoid lots of sunlight or tanning lamps until the glue falls off. Put sunscreen on the cut for the first year to reduce your scar.  The glue will fall off on its own. Do not pick at the glue. You may need a tetanus shot if:  You cannot remember when you had your last tetanus shot.  You have never had a tetanus shot. If you need a tetanus shot and you choose not  to have one, you may get tetanus. Sickness from tetanus can be serious. GET HELP RIGHT AWAY IF:   Your pain does not get better with medicine.  Your arm, hand, leg, or foot loses feeling (numbness) or changes color.  Your cut is bleeding.  Your joint feels weak, or you cannot use your joint.  You have painful lumps on your body.  Your cut is red, puffy (swollen), or painful.  You have a red line on the skin near the cut.  You have yellowish-white fluid (pus) coming from the cut.  You have a fever.  You have a bad smell coming from the cut or bandage.  Your cut  breaks open before or after stitches are removed.  You notice something coming out of the cut, such as wood or glass.  You cannot move a finger or toe. MAKE SURE YOU:   Understand these instructions.  Will watch your condition.  Will get help right away if you are not doing well or get worse. Document Released: 10/23/2007 Document Revised: 07/29/2011 Document Reviewed: 10/30/2010 [facility] Patient Information 2015 [place], [place]. This information is not intended to replace advice given to you by your health care provider. Make sure you discuss any questions you have with your health care provider.   Keep the finger wound and dressing clean, dry, and covered. Take the antibiotic as directed until completely gone.  Follow-up with Dr. Rudene Christians on Monday, as discussed.

## 2015-01-12 NOTE — ED Notes (Signed)
Was given toradol 60 mg and tetanus at [facility]

## 2015-05-12 ENCOUNTER — Ambulatory Visit: Payer: Self-pay | Admitting: Physician Assistant

## 2015-05-25 ENCOUNTER — Ambulatory Visit: Payer: BLUE CROSS/BLUE SHIELD | Admitting: Family Medicine

## 2015-07-05 ENCOUNTER — Encounter: Payer: Self-pay | Admitting: Family Medicine

## 2015-07-05 ENCOUNTER — Ambulatory Visit (INDEPENDENT_AMBULATORY_CARE_PROVIDER_SITE_OTHER): Payer: BLUE CROSS/BLUE SHIELD | Admitting: Family Medicine

## 2015-07-05 VITALS — BP 130/72 | HR 66 | Temp 97.8°F | Resp 16 | Wt 169.6 lb

## 2015-07-05 DIAGNOSIS — J069 Acute upper respiratory infection, unspecified: Secondary | ICD-10-CM

## 2015-07-05 MED ORDER — DOXYCYCLINE HYCLATE 100 MG PO TABS: 100.0000 mg | ORAL_TABLET | Freq: Two times a day (BID) | ORAL | Status: DC

## 2015-07-05 NOTE — Patient Instructions (Signed)
Continue Mucinex Max, Delsym for cough, and Benadryl at night for post nasal drainage. If your sinuses are not improving over the next 3-4 days may start the antibiotic.

## 2015-07-05 NOTE — Progress Notes (Signed)
Subjective:     Patient ID: Cameron Bennett, male   DOB: [birth year], 55 y.o.   MRN: [id number]  HPI  Chief Complaint  Patient presents with  . Cough    Patient comes in office today with concerns of cough and congestion for the past 4 days. Patient states that mucous has went from clear to yellow, he reports fullness in his ears and sinus pressure. Patient reports taking otc Mucinex  States he has cut down smoking while ill to 1/2ppd.   Review of Systems  Constitutional: Negative for fever and chills.  Respiratory: Negative for shortness of breath.        Objective:   Physical Exam  Constitutional: He appears well-developed and well-nourished. No distress.  Ears: T.M's intact without inflammation Sinuses: mild maxillary sinus tenderness Throat: tonsils absent Neck: no cervical adenopathy Lungs: clear     Assessment:    1. Upper respiratory infection - doxycycline (VIBRA-TABS) 100 MG tablet; Take 1 tablet (100 mg total) by mouth 2 (two) times daily.  Dispense: 20 tablet; Refill: 0    Plan:    Continue sx treatment. If sinuses not improving over the next 3-4 days to start abx.

## 2015-08-04 ENCOUNTER — Other Ambulatory Visit: Payer: Self-pay

## 2015-08-04 MED ORDER — DICLOFENAC SODIUM 1 % TD GEL: 2.0000 g | Freq: Four times a day (QID) | TRANSDERMAL | Status: DC

## 2015-08-04 NOTE — Telephone Encounter (Signed)
Patient is requesting a refill on Voltaren Gel be sent to [facility] in [place].

## 2015-08-17 ENCOUNTER — Telehealth: Payer: Self-pay | Admitting: Family Medicine

## 2015-08-17 NOTE — Telephone Encounter (Signed)
Ronalee Belts with [facility] request a call back to discuss prior auth for Voltaren Gel.  [id number]

## 2015-08-18 NOTE — Telephone Encounter (Signed)
Given Aspercreme with Lidocaine (OTC) a try in placed of the Voltaren Gel.

## 2015-08-18 NOTE — Telephone Encounter (Signed)
PA denied see media scanned documentation. What can patient try?-aa

## 2015-08-21 NOTE — Telephone Encounter (Signed)
LMTCB

## 2015-08-31 NOTE — Telephone Encounter (Signed)
LMTCB ED 

## 2016-04-27 IMAGING — CT CT CHEST W/ CM
2 of 3 series · 15 of 36 positions shown, 18 images · IV contrast (omnipaque)
Comparison: CT scan of April 18, 2005.

CLINICAL DATA: Cough with hemoptysis.

EXAM:
CT CHEST WITH CONTRAST
TECHNIQUE: Multidetector CT imaging of the chest was performed during
intravenous contrast administration.
CONTRAST:  75mL OMNIPAQUE IOHEXOL 350 MG/ML SOLN

[Series 2: routine chest with · axial · 0.71mm/px · z∈[-862,-577]mm · 12 of 67 slices shown, 15 images]
[im 5/67  mediastinal]
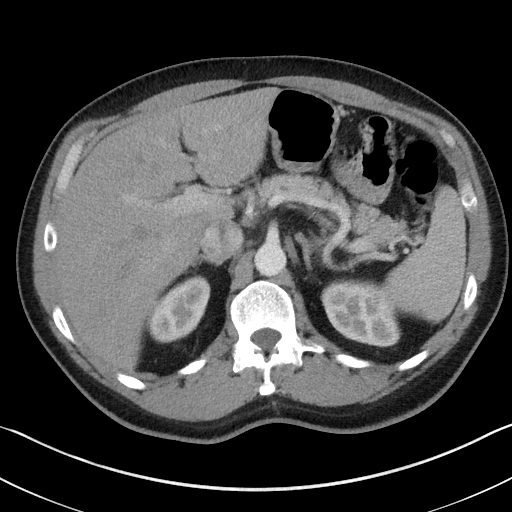
[im 5/67  lung]
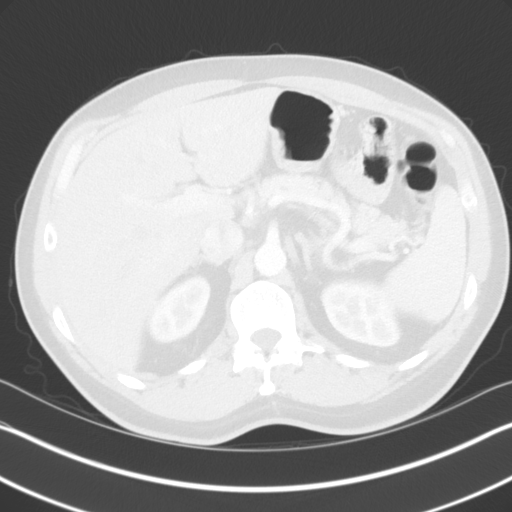
[im 10/67  lung]
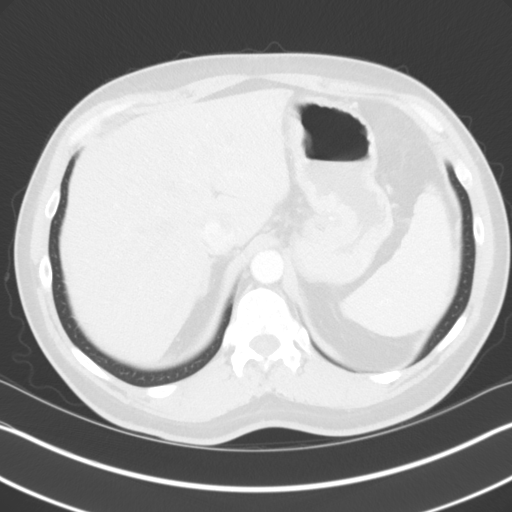
[im 15/67  lung]
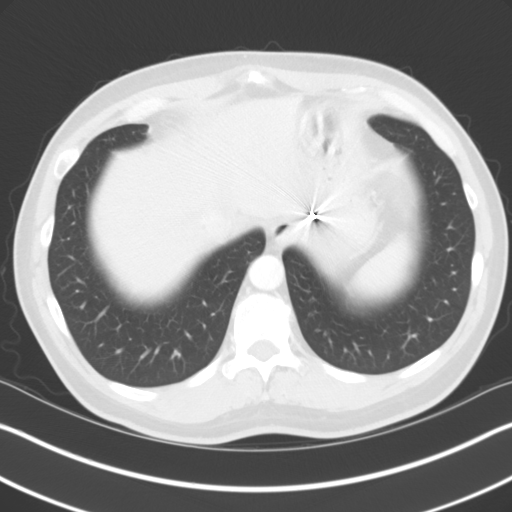
[im 20/67  lung]
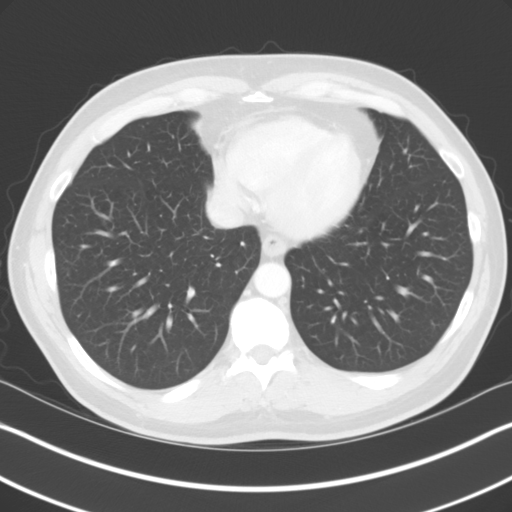
[im 25/67  mediastinal]
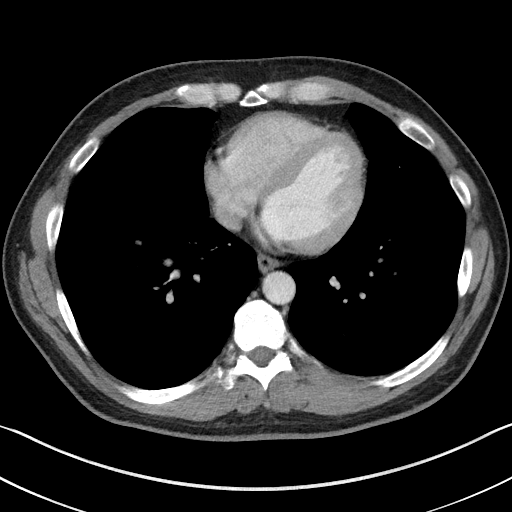
[im 25/67  lung]
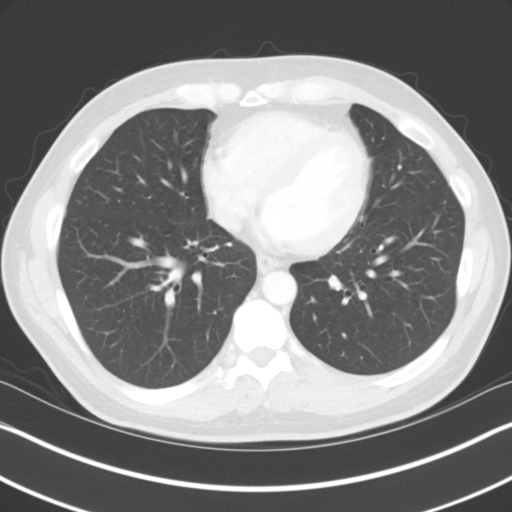
[im 30/67  lung]
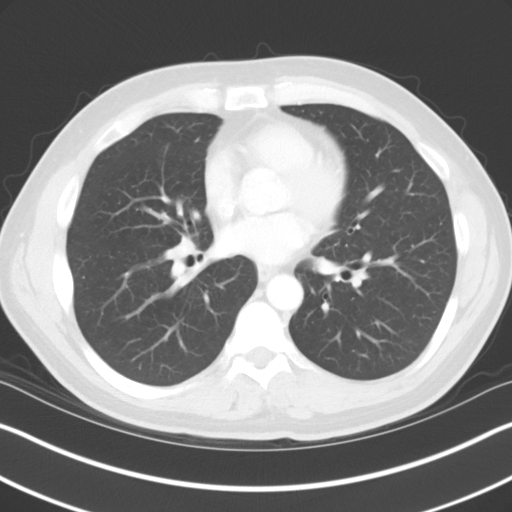
[im 37/67  lung]
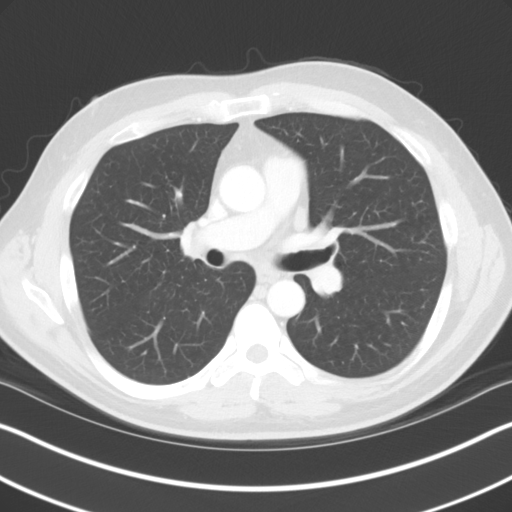
[im 42/67  lung]
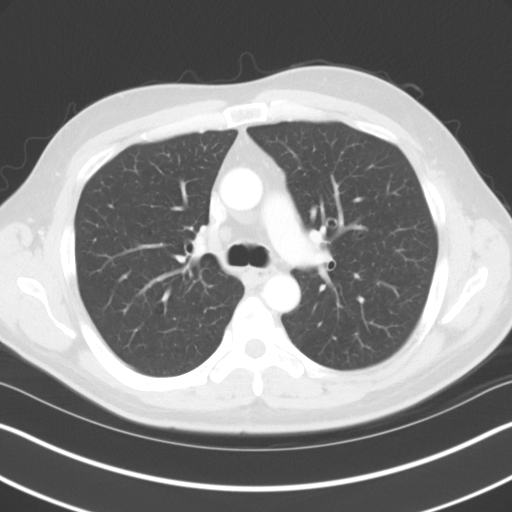
[im 47/67  mediastinal]
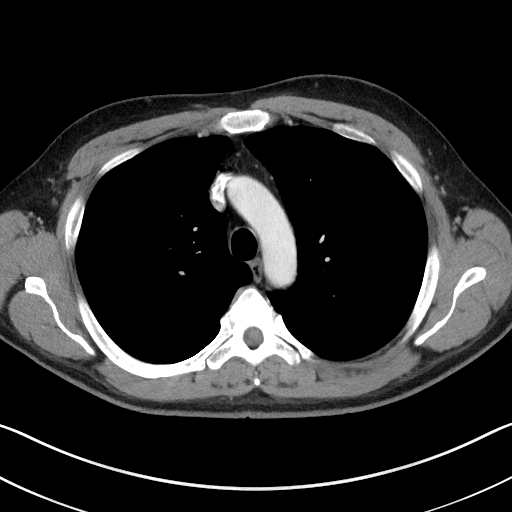
[im 47/67  lung]
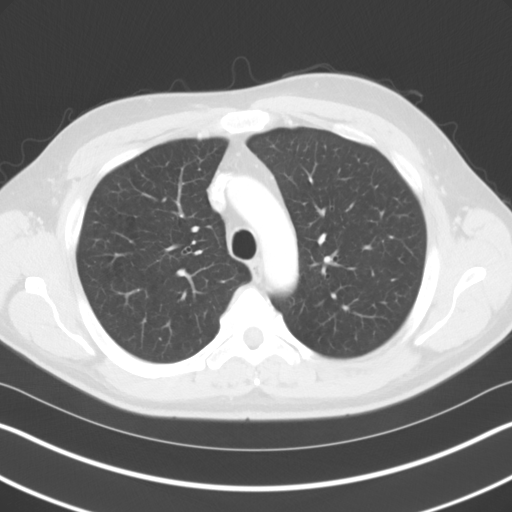
[im 52/67  lung]
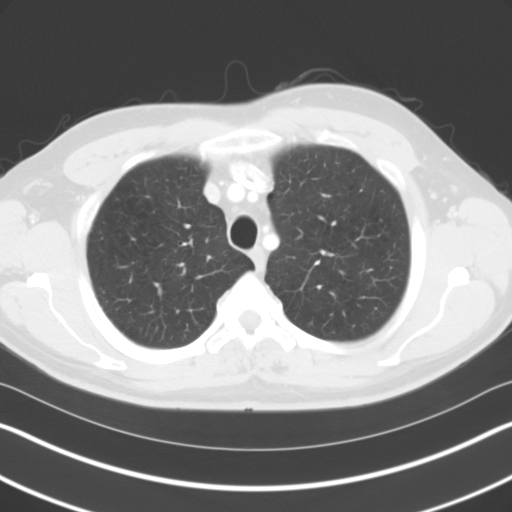
[im 57/67  lung]
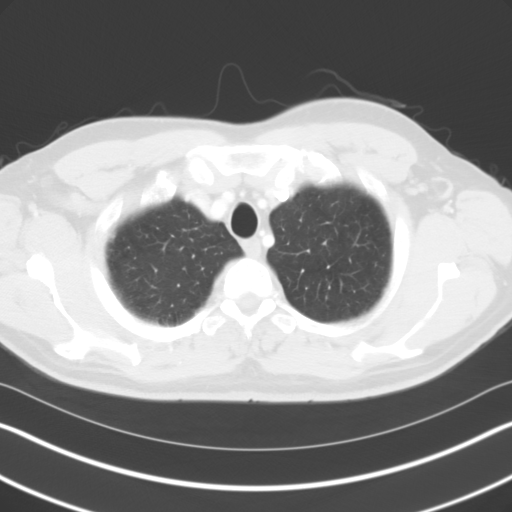
[im 62/67  lung]
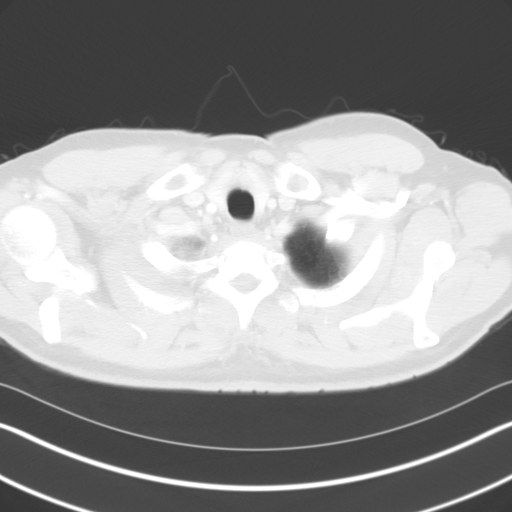

[Series 5: cor routine chest with · coronal · 0.66mm/px · 3 of 141 slices shown]
[im 29/141  lung]
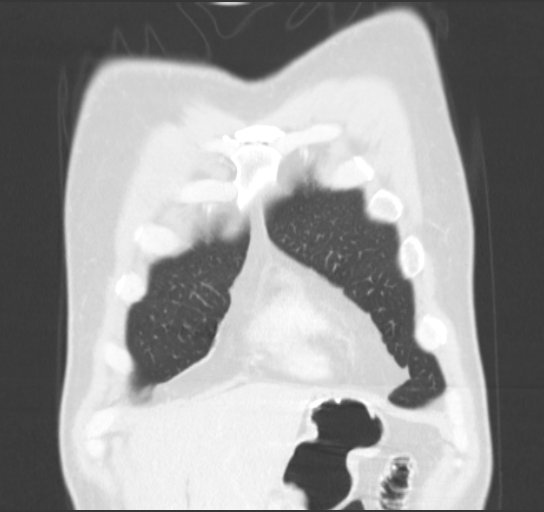
[im 57/141  lung]
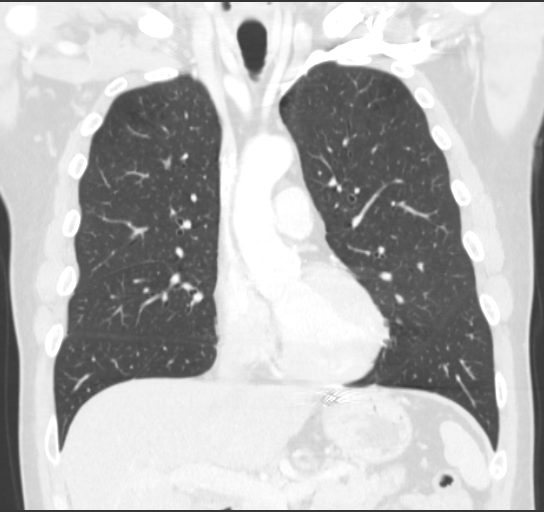
[im 85/141  lung]
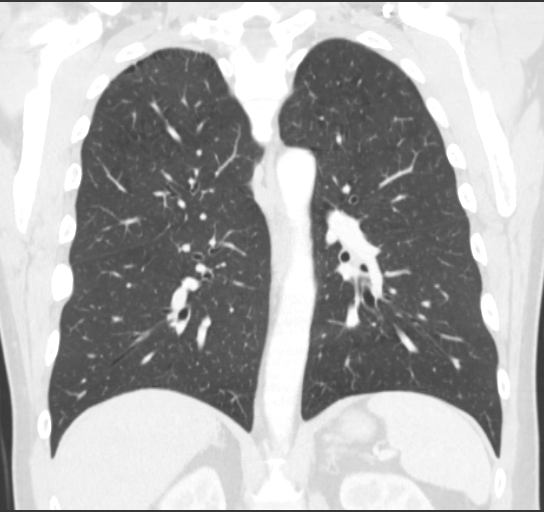

[15 of 36 positions shown; findings below may reference images not displayed]

FINDINGS: No pneumothorax or pleural effusion is noted. No acute pulmonary
disease is noted. No pulmonary nodule or mass is noted. Old right
rib fracture is noted. Visualized portion of upper abdomen is
unremarkable. There is no evidence of thoracic aortic dissection or
aneurysm. Great vessels are widely patent. Stable 17 x 8 mm lymph
node is noted in the aortopulmonary window. Stable 13 x 10 mm
precarinal lymph node is noted. No other mediastinal mass or
adenopathy is noted.
IMPRESSION: No significant abnormality seen in the chest.

## 2016-05-29 ENCOUNTER — Inpatient Hospital Stay: Admission: RE | Admit: 2016-05-29 | Payer: Self-pay | Source: Ambulatory Visit

## 2016-06-06 ENCOUNTER — Ambulatory Visit
Admission: RE | Admit: 2016-06-06 | Discharge: 2016-06-06 | Disposition: A | Discharge status: Home / Self Care | Payer: Managed Care, Other (non HMO) | Source: Ambulatory Visit | Attending: Urology | Admitting: Urology

## 2016-06-06 DIAGNOSIS — D751 Secondary polycythemia: Secondary | ICD-10-CM | POA: Insufficient documentation

## 2016-06-06 LAB — HEMOGLOBIN AND HEMATOCRIT, BLOOD
HCT: 56.9 % — ABNORMAL HIGH (ref 40.0–52.0)
Hemoglobin: 19.3 g/dL — ABNORMAL HIGH (ref 13.0–18.0)

## 2016-06-13 ENCOUNTER — Ambulatory Visit
Admission: RE | Admit: 2016-06-13 | Discharge: 2016-06-13 | Disposition: A | Discharge status: Home / Self Care | Payer: Managed Care, Other (non HMO) | Source: Ambulatory Visit | Attending: Orthopedic Surgery | Admitting: Orthopedic Surgery

## 2016-06-13 DIAGNOSIS — D751 Secondary polycythemia: Secondary | ICD-10-CM | POA: Diagnosis present

## 2016-06-13 LAB — HEMOGLOBIN AND HEMATOCRIT, BLOOD
HCT: 52.4 % — ABNORMAL HIGH (ref 40.0–52.0)
Hemoglobin: 17.4 g/dL (ref 13.0–18.0)

## 2017-01-13 ENCOUNTER — Encounter: Payer: Self-pay | Admitting: Family Medicine

## 2017-01-13 ENCOUNTER — Ambulatory Visit (INDEPENDENT_AMBULATORY_CARE_PROVIDER_SITE_OTHER): Payer: 59 | Admitting: Family Medicine

## 2017-01-13 VITALS — BP 140/88 | HR 76 | Temp 98.4°F | Ht 67.75 in | Wt 191.6 lb

## 2017-01-13 DIAGNOSIS — G8929 Other chronic pain: Secondary | ICD-10-CM

## 2017-01-13 DIAGNOSIS — Z87898 Personal history of other specified conditions: Secondary | ICD-10-CM | POA: Diagnosis not present

## 2017-01-13 DIAGNOSIS — F339 Major depressive disorder, recurrent, unspecified: Secondary | ICD-10-CM

## 2017-01-13 DIAGNOSIS — M5441 Lumbago with sciatica, right side: Secondary | ICD-10-CM | POA: Diagnosis not present

## 2017-01-13 DIAGNOSIS — R631 Polydipsia: Secondary | ICD-10-CM

## 2017-01-13 DIAGNOSIS — G609 Hereditary and idiopathic neuropathy, unspecified: Secondary | ICD-10-CM | POA: Diagnosis not present

## 2017-01-13 DIAGNOSIS — F1911 Other psychoactive substance abuse, in remission: Secondary | ICD-10-CM

## 2017-01-13 NOTE — Progress Notes (Signed)
Patient: Cameron Bennett, Male    DOB: Dec 19, 1960, 56 y.o.   MRN: [id number] Visit Date: 01/13/2017  Today's Provider: Vernie Murders, PA   Chief Complaint  Patient presents with  . Annual Exam   Subjective:    Annual physical exam Cameron Bennett is a 56 y.o. male who presents today for health maintenance and complete physical. He feels well. He reports exercising lightly, but has a  Job that requires lifting and bending. He reports he is sleeping poorly.  -----------------------------------------------------------------   Review of Systems  Constitutional: Negative.   HENT: Negative.   Eyes: Negative.   Respiratory: Positive for cough and wheezing.   Cardiovascular: Negative.   Gastrointestinal: Positive for nausea.  Endocrine: Negative.   Genitourinary: Negative.   Musculoskeletal: Positive for back pain.  Skin: Negative.   Allergic/Immunologic: Negative.   Neurological: Negative.   Hematological: Negative.   Psychiatric/Behavioral: Positive for decreased concentration, dysphoric mood and sleep disturbance. The patient is nervous/anxious.     Social History      He  reports that he has been smoking.  He has never used smokeless tobacco. He reports that he does not drink alcohol or use drugs.       Social History   Social History  . Marital status: Divorced    Spouse name: N/A  . Number of children: N/A  . Years of education: N/A   Social History Main Topics  . Smoking status: Current Every Day Smoker  . Smokeless tobacco: Never Used  . Alcohol use No  . Drug use: No     Comment: quit drug use 06/21/2010  . Sexual activity: Not Asked   Other Topics Concern  . None   Social History Narrative  . None    Patient Active Problem List   Diagnosis Date Noted  . Back pain, chronic 11/02/2014  . Dysfunction of eustachian tube 11/02/2014  . H/O drug abuse 11/02/2014  . History of surgery to major organs, presenting hazards to health 11/02/2014  .  Personal history of urinary calculi 11/02/2014  . 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 Adiposity 11/02/2014  . Dyssomnia 11/02/2014  . 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 Anxiety, generalized 11/08/2005    Past Surgical History:  Procedure Laterality Date  . ELBOW SURGERY Right 2008  . HEMORROIDECTOMY    . HERNIA REPAIR    . LUMBAR LAMINECTOMY  2012    Family History        Family Status  Relation Status  . Mother Deceased  . Father Deceased  . Sister Alive  . Brother Alive        His family history includes COPD in his mother; Diabetes in his brother and sister; Lung cancer in his father.     Allergies  Allergen Reactions  . Penicillins Swelling  . Sulfa Antibiotics Rash    Other reaction(s): Unknown     Current Outpatient Prescriptions:  .  Aspirin-Acetaminophen-Caffeine (EXCEDRIN PO), Take 1 tablet by mouth every 6 (six) hours as needed. For pain, Disp: , Rfl:    Patient Care Team: Zayd Bonet, Vickki Muff, PA as PCP - General (Family Medicine)      Objective:   Vitals: BP 140/88 (BP Location: Right Arm, Patient Position: Sitting, Cuff Size: Normal)   Pulse 76   Temp 98.4 F (36.9 C) (Oral)   Ht 5' 7.75" (1.721 m)   Wt 191 lb 9.6 oz (86.9 kg)   SpO2 97%   BMI 29.35 kg/m  Vitals:   01/13/17 1537  BP: 140/88  Pulse: 76  Temp: 98.4 F (36.9 C)  TempSrc: Oral  SpO2: 97%  Weight: 191 lb 9.6 oz (86.9 kg)  Height: 5' 7.75" (1.721 m)     Physical Exam  Constitutional: He is oriented to person, place, and time. He appears well-developed and well-nourished.  HENT:  Head: Normocephalic and atraumatic.  Right Ear: External ear normal.  Left Ear: External ear normal.  Nose: Nose normal.  Mouth/Throat: Oropharynx is clear and moist.  Eyes: Pupils are equal, round, and reactive to light. Conjunctivae and EOM are normal. Right eye exhibits no discharge.  Neck: Normal range of motion. Neck supple. No tracheal deviation present. No thyromegaly present.    Cardiovascular: Normal rate, regular rhythm, normal heart sounds and intact distal pulses.   No murmur heard. Pulmonary/Chest: Effort normal and breath sounds normal. No respiratory distress. He has no wheezes. He has no rales. He exhibits no tenderness.  Abdominal: Soft. He exhibits no distension and no mass. There is no tenderness. There is no rebound and no guarding.  Musculoskeletal: Normal range of motion. He exhibits no edema or tenderness.  Lymphadenopathy:    He has no cervical adenopathy.  Neurological: He is alert and oriented to person, place, and time. He has normal reflexes. No cranial nerve deficit. He exhibits normal muscle tone. Coordination normal.  Skin: Skin is warm and dry. No rash noted. No erythema.  Psychiatric: His behavior is normal. Judgment and thought content normal. He exhibits a depressed mood.   Depression Screen PHQ 2/9 Scores 01/13/2017  PHQ - 2 Score 4  PHQ- 9 Score 17    Assessment & Plan:     Routine Health Maintenance and Physical Exam  Exercise Activities and Dietary recommendations Goals    Recommend regular walking exercise 30 minutes 3-4 days a week and balanced regular diet.      Immunization History  Administered Date(s) Administered  . Influenza Split 02/04/2009, 04/30/2011, 06/01/2012  . Influenza,inj,Quad PF,6+ Mos 02/22/2014  . Influenza-Unspecified 03/23/2015  . Tdap 01/12/2015    Health Maintenance  Topic Date Due  . HIV Screening  10/03/1977  . INFLUENZA VACCINE  12/18/2016  . COLONOSCOPY  03/28/2024  . TETANUS/TDAP  01/11/2025  . Hepatitis C Screening  Completed     Discussed health benefits of physical activity, and encouraged him to engage in regular exercise appropriate for his age and condition.    -------------------------------------------------------------------- 1. Depression, recurrent ([place]) Feeling sad with spontaneous crying spells, moodiness and anhedonia over the past month. Living alone since getting  divorce (still talks with wife frequently and claims she is his "best friend"). Afraid of most medications with his history of drug and alcohol abuse. Will consider an SSRI if labs are normal. - CBC with Differential/Platelet - Comprehensive metabolic panel  2. Polydipsia Sister and brother diagnosed with DM and he has noticed polyuria, polydipsia, weight loss and peripheral neuropathy in both feet. Will check labs for CBC, CMP and Hgb A1C.  - CBC with Differential/Platelet - Comprehensive metabolic panel - Hemoglobin A1c  3. Idiopathic peripheral neuropathy Has had numbness and tingling/burning in both feet for a couple years. Was tried on Gabapentin up to 900 mg TID by his psychiatrist. Did not feel this helped and is worried about possible diabetes. Will get labs and recheck pending reports. - CBC with Differential/Platelet - Comprehensive metabolic panel - Hemoglobin A1c  4. H/O drug abuse Been off ETOH and opiates/benzos for the past 2-4 years.  Still attends AA meetings and will not try any medication with an addictive potential. Recommend he continue counseling.  5. Chronic right-sided low back pain with right-sided sciatica History of L4-5 lumbar microdiscectomy in 2012 but continues to have back pain with radiculopathy down the right posterior leg to the knee intermittently. Would like to try Meloxicam for inflammation but afraid of any pain medications with his history of drug and alcohol use. Will check labs and consider a trial for a short time if no liver or kidney issues.    Vernie Murders, PA  [facility]

## 2017-01-16 LAB — COMPREHENSIVE METABOLIC PANEL
ALBUMIN: 4.1 g/dL (ref 3.5–5.5)
ALK PHOS: 75 IU/L (ref 39–117)
ALT: 16 IU/L (ref 0–44)
AST: 16 IU/L (ref 0–40)
Albumin/Globulin Ratio: 2.2 (ref 1.2–2.2)
BUN / CREAT RATIO: 19 (ref 9–20)
BUN: 19 mg/dL (ref 6–24)
Bilirubin Total: 0.5 mg/dL (ref 0.0–1.2)
CO2: 24 mmol/L (ref 20–29)
CREATININE: 0.98 mg/dL (ref 0.76–1.27)
Calcium: 9.4 mg/dL (ref 8.7–10.2)
Chloride: 105 mmol/L (ref 96–106)
GFR, EST AFRICAN AMERICAN: 101 mL/min/{1.73_m2} (ref >59)
GFR, EST NON AFRICAN AMERICAN: 87 mL/min/{1.73_m2} (ref >59)
GLOBULIN, TOTAL: 1.9 g/dL (ref 1.5–4.5)
Glucose: 96 mg/dL (ref 65–99)
Potassium: 4.8 mmol/L (ref 3.5–5.2)
SODIUM: 143 mmol/L (ref 134–144)
TOTAL PROTEIN: 6 g/dL (ref 6.0–8.5)

## 2017-01-16 LAB — CBC WITH DIFFERENTIAL/PLATELET
Basophils Absolute: 0 10*3/uL (ref 0.0–0.2)
Basos: 0 %
EOS (ABSOLUTE): 0.1 10*3/uL (ref 0.0–0.4)
EOS: 1 %
HEMATOCRIT: 51.8 % — AB (ref 37.5–51.0)
HEMOGLOBIN: 17.4 g/dL (ref 13.0–17.7)
Immature Grans (Abs): 0 10*3/uL (ref 0.0–0.1)
Immature Granulocytes: 0 %
LYMPHS ABS: 1.9 10*3/uL (ref 0.7–3.1)
Lymphs: 22 %
MCH: 32.7 pg (ref 26.6–33.0)
MCHC: 33.6 g/dL (ref 31.5–35.7)
MCV: 97 fL (ref 79–97)
MONOCYTES: 7 %
Monocytes Absolute: 0.6 10*3/uL (ref 0.1–0.9)
Neutrophils Absolute: 5.9 10*3/uL (ref 1.4–7.0)
Neutrophils: 70 %
Platelets: 197 10*3/uL (ref 150–379)
RBC: 5.32 x10E6/uL (ref 4.14–5.80)
RDW: 14 % (ref 12.3–15.4)
WBC: 8.5 10*3/uL (ref 3.4–10.8)

## 2017-01-16 LAB — HEMOGLOBIN A1C
Est. average glucose Bld gHb Est-mCnc: 105 mg/dL
HEMOGLOBIN A1C: 5.3 % (ref 4.8–5.6)

## 2017-01-17 ENCOUNTER — Telehealth: Payer: Self-pay

## 2017-01-17 MED ORDER — SERTRALINE HCL 50 MG PO TABS: 50.0000 mg | ORAL_TABLET | Freq: Every day | ORAL | 3 refills | Status: DC

## 2017-01-17 NOTE — Telephone Encounter (Signed)
Pt called back and would also like to start Sertraline 50mg   Thanks,   -Mickel Baas

## 2017-01-17 NOTE — Telephone Encounter (Signed)
[facility] to [facility].   Thanks,   -Mickel Baas

## 2017-01-17 NOTE — Telephone Encounter (Signed)
Sertraline 50 mg HS #30 and 3 RF.

## 2017-01-17 NOTE — Telephone Encounter (Signed)
-----   Message from [place], [place] sent at 01/16/2017  3:51 PM EDT ----- All blood tests in normal ranges. If counseling, balanced diet, sleeping 7-8 hours a night and 30 minute exercise 4 days a week doesn't improve depressive symptoms, recommend Sertraline 50 mg at bedtime #30 and recheck in a month.

## 2017-01-17 NOTE — Telephone Encounter (Signed)
Pt advised.   He stated you two talked about starting Mobic for back pain.  [facility] [place]   Thanks,   -Mickel Baas

## 2017-04-29 ENCOUNTER — Other Ambulatory Visit: Payer: Self-pay | Admitting: Family Medicine

## 2017-05-12 ENCOUNTER — Other Ambulatory Visit: Payer: Self-pay | Admitting: Family Medicine

## 2017-05-12 MED ORDER — SERTRALINE HCL 50 MG PO TABS: ORAL_TABLET | ORAL | 3 refills | Status: DC

## 2017-05-12 NOTE — Telephone Encounter (Signed)
[facility] [address] faxed refill request for the following medications:  sertraline (ZOLOFT) 50 MG tablet  90 day supply  Please advise. Thanks TNP

## 2017-06-17 ENCOUNTER — Ambulatory Visit (INDEPENDENT_AMBULATORY_CARE_PROVIDER_SITE_OTHER): Payer: 59 | Admitting: Family Medicine

## 2017-06-17 ENCOUNTER — Encounter: Payer: Self-pay | Admitting: Family Medicine

## 2017-06-17 VITALS — BP 150/100 | HR 86 | Temp 98.1°F | Resp 16 | Wt 202.8 lb

## 2017-06-17 DIAGNOSIS — J069 Acute upper respiratory infection, unspecified: Secondary | ICD-10-CM

## 2017-06-17 NOTE — Progress Notes (Signed)
Subjective:     Patient ID: Cameron Bennett, male   DOB: August 11, 1960, 57 y.o.   MRN: [id number] Chief Complaint  Patient presents with  . Cough    Patient comes in office today with complaints of cough and congestion for the past 4 days. Patient reports soreness in his chest and back, fatigue and wheezing. Patient has tried otc Mucinex, Sinus Relief and Robitussin DM.   HPI States he has cut down on smoking while ill and has missed work this week. + flu shot.  Review of Systems     Objective:   Physical Exam  Constitutional: He appears well-developed and well-nourished. No distress.  Ears: T.M's intact without inflammation Throat:tonsils absent with mild posterior pharyngeal erythema Neck: no cervical adenopathy Lungs: clear     Assessment:    1. Viral upper respiratory tract infection     Plan:    Discussed otc medication which would not push up his bp.

## 2017-06-17 NOTE — Progress Notes (Deleted)
Subjective:     Patient ID: Cameron Bennett, male   DOB: 29-Nov-1960, 57 y.o.   MRN: [id number]  HPI   Review of Systems     Objective:   Physical Exam     Assessment:     ***    Plan:     ***

## 2017-06-17 NOTE — Patient Instructions (Signed)
Discussed use of Mucinex, Delsym for cough (also two teaspoons of honey), Benadryl at night for post nasal drainage,and Claritin or similar for nasal drip. Call on or about 06/23/17 if sinuses not improving.

## 2017-06-19 ENCOUNTER — Other Ambulatory Visit: Payer: Self-pay | Admitting: Family Medicine

## 2017-06-19 ENCOUNTER — Telehealth: Payer: Self-pay | Admitting: Family Medicine

## 2017-06-19 MED ORDER — DOXYCYCLINE HYCLATE 100 MG PO TABS: 100.0000 mg | ORAL_TABLET | Freq: Two times a day (BID) | ORAL | 0 refills | Status: DC

## 2017-06-19 MED ORDER — ALBUTEROL SULFATE HFA 108 (90 BASE) MCG/ACT IN AERS: 2.0000 | INHALATION_SPRAY | Freq: Four times a day (QID) | RESPIRATORY_TRACT | 0 refills | Status: DC | PRN

## 2017-06-19 NOTE — Telephone Encounter (Signed)
Please review. Thanks!  

## 2017-06-19 NOTE — Telephone Encounter (Signed)
I have sent in albuterol inhaler and doxycycline abx. Z-pack not a good choice for sinus infections.

## 2017-06-19 NOTE — Telephone Encounter (Signed)
Patient states that he was in for a office visit on 06/17/2017 and you told him to call if he was not any better.  He states that his mucus has changed to a greenish brown color.  He is coughing but not coughing up anything.  He states his chest is tight feeling.  He states that he is not feeling any better but not really worse except fatigue.  He would like to know if you could send in a zpack and inhaler if possible.  [facility] [place]

## 2017-06-19 NOTE — Telephone Encounter (Signed)
Patient advised.KW 

## 2017-07-29 ENCOUNTER — Ambulatory Visit (INDEPENDENT_AMBULATORY_CARE_PROVIDER_SITE_OTHER): Payer: 59 | Admitting: Family Medicine

## 2017-07-29 ENCOUNTER — Encounter: Payer: Self-pay | Admitting: Family Medicine

## 2017-07-29 VITALS — BP 128/84 | Temp 98.1°F | Resp 16

## 2017-07-29 DIAGNOSIS — F418 Other specified anxiety disorders: Secondary | ICD-10-CM | POA: Diagnosis not present

## 2017-07-29 DIAGNOSIS — G609 Hereditary and idiopathic neuropathy, unspecified: Secondary | ICD-10-CM | POA: Diagnosis not present

## 2017-07-29 DIAGNOSIS — F5105 Insomnia due to other mental disorder: Secondary | ICD-10-CM | POA: Diagnosis not present

## 2017-07-29 DIAGNOSIS — Z87898 Personal history of other specified conditions: Secondary | ICD-10-CM

## 2017-07-29 DIAGNOSIS — F1911 Other psychoactive substance abuse, in remission: Secondary | ICD-10-CM

## 2017-07-29 MED ORDER — PREGABALIN ER 165 MG PO TB24: 165.0000 mg | ORAL_TABLET | Freq: Every day | ORAL | 0 refills | Status: DC

## 2017-07-29 NOTE — Progress Notes (Signed)
Patient: Cameron Bennett Male    DOB: Nov 02, 1960   57 y.o.   MRN: [id number] Visit Date: 07/29/2017  Today's Provider: Vernie Murders, PA   Chief Complaint  Patient presents with  . Foot Pain   Subjective:    HPI  Patient reports he is having neuropathy in both feet for over 9 months. He reports that he has tried rolling a water bottle on his foot at bedtime, taken Tylenol, and compression socks to help decrease his symptoms. He describes pain as "having a sunburn on his feet". Patient also mentions that his symptoms has affected his sleep tremendously.      No past medical history on file. Patient Active Problem List   Diagnosis Date Noted  . Back pain, chronic 11/02/2014  . Dysfunction of eustachian tube 11/02/2014  . H/O drug abuse 11/02/2014  . History of surgery to major organs, presenting hazards to health 11/02/2014  . Personal history of urinary calculi 11/02/2014  . 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 Adiposity 11/02/2014  . Dyssomnia 11/02/2014  . 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 Anxiety, generalized 11/08/2005   Past Surgical History:  Procedure Laterality Date  . ELBOW SURGERY Right 2008  . HEMORROIDECTOMY    . HERNIA REPAIR    . LUMBAR LAMINECTOMY  2012   Family History  Problem Relation Age of Onset  . COPD Mother   . Lung cancer Father   . Diabetes Sister   . Diabetes Brother    Allergies  Allergen Reactions  . Penicillins Swelling  . Sulfa Antibiotics Rash    Other reaction(s): Unknown    Current Outpatient Medications:  .  albuterol (PROVENTIL HFA;VENTOLIN HFA) 108 (90 Base) MCG/ACT inhaler, Inhale 2 puffs into the lungs every 6 (six) hours as needed for wheezing or shortness of breath., Disp: 1 Inhaler, Rfl: 0 .  Aspirin-Acetaminophen-Caffeine (EXCEDRIN PO), Take 1 tablet by mouth every 6 (six) hours as needed. For pain, Disp: , Rfl:  .  b complex vitamins tablet, Take 1 tablet by mouth daily., Disp: , Rfl:  .  Multiple Vitamin  (MULTI-VITAMINS) TABS, Take by mouth., Disp: , Rfl:  .  doxycycline (VIBRA-TABS) 100 MG tablet, Take 1 tablet (100 mg total) by mouth 2 (two) times daily. (Patient not taking: Reported on 07/29/2017), Disp: 20 tablet, Rfl: 0  Review of Systems  Constitutional: Positive for fatigue.  Cardiovascular: Negative for leg swelling.  Endocrine: Negative for cold intolerance and heat intolerance.  Musculoskeletal: Positive for arthralgias and myalgias.  Skin: Negative for color change, pallor, rash and wound.  Neurological: Positive for numbness. Negative for dizziness, tremors, seizures, syncope, facial asymmetry, speech difficulty, weakness and light-headedness.  Hematological: Does not bruise/bleed easily.  Psychiatric/Behavioral: Positive for sleep disturbance. The patient is not nervous/anxious.    Social History   Tobacco Use  . Smoking status: Current Every Day Smoker  . Smokeless tobacco: Never Used  Substance Use Topics  . Alcohol use: No    Alcohol/week: 0.0 oz   Objective:   BP 128/84 (BP Location: Right Arm, Patient Position: Sitting, Cuff Size: Normal)   Temp 98.1 F (36.7 C)   Resp 16  Vitals:   07/29/17 1534  BP: 128/84  Resp: 16  Temp: 98.1 F (36.7 C)   Physical Exam  Constitutional: He is oriented to person, place, and time. He appears well-developed and well-nourished. No distress.  HENT:  Head: Normocephalic and atraumatic.  Right Ear: Hearing normal.  Left Ear: Hearing normal.  Nose: Nose normal.  Eyes: Conjunctivae and lids are normal. Right eye exhibits no discharge. Left eye exhibits no discharge. No scleral icterus.  Pulmonary/Chest: Effort normal. No respiratory distress.  Musculoskeletal: Normal range of motion.  Neurological: He is alert and oriented to person, place, and time.  Loss of sensation in both feet to the top of socks above ankles.  Skin: Skin is intact. No lesion and no rash noted.  Psychiatric: His speech is normal and behavior is normal.  Thought content normal. His affect is blunt.   Diabetic Foot Form - Detailed   Diabetic Foot Exam - detailed Diabetic Foot exam was performed with the following findings:  Yes 07/29/2017  5:17 PM  Visual Foot Exam completed.:  Yes  Can the patient see the bottom of their feet?:  Yes Are the shoes appropriate in style and fit?:  Yes Is there swelling or and abnormal foot shape?:  No Is there a claw toe deformity?:  No Is there elevated skin temparature?:  No Is there foot or ankle muscle weakness?:  No Normal Range of Motion:  Yes Right posterior Tibialias:  Present Left posterior Tibialias:  Present  Right Dorsalis Pedis:  Present Left Dorsalis Pedis:  Present  Sensory Foot Exam Completed.:  Yes Semmes-Weinstein Monofilament Test R Site 1-Great Toe:  Neg L Site 1-Great Toe:  Pos    Comments:  No sensation to test plantar surface of each foot with nylon string. Decreased sensation and areas of no sensation on the dorsum of each foot from toes to above ankles.       Assessment & Plan:     1. Idiopathic peripheral neuropathy Has had burning, pins & needles sensation with numbness in both feet (R>L). Nearly no sensation at all plantar surfaces of both feet and dorsum of both feet up above ankles to check with a nylon string and tuning fork for vibratory sensation. States he was placed on Gabapentin 900 mg TID in the past and it helped the feet. Could not continue to take it due to side effects and requests something different. Will give a trial of Lyrica-CR 165 mg one tablet after supper qd #14. Encouraged to proceed with podiatry follow up as he has planned. Call report of Lyrica effect in 2 weeks.  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 No suicidal or homicidal ideation. Does not want any mood or mind altering medications due to past addiction issues.  Requests psychiatry referral. - Ambulatory referral to Psychiatry  3. H/O drug abuse Sober for 3 years now. With depressive symptoms, wants to be referred to [facility] for evaluation and treatment. Prefers psychotherapy and no medications due to past addiction issues. - Ambulatory referral to Psychiatry        Vernie Murders, [facility]

## 2017-08-07 ENCOUNTER — Other Ambulatory Visit: Payer: Self-pay | Admitting: Family Medicine

## 2017-08-07 ENCOUNTER — Telehealth: Payer: Self-pay | Admitting: Family Medicine

## 2017-08-07 DIAGNOSIS — G609 Hereditary and idiopathic neuropathy, unspecified: Secondary | ICD-10-CM

## 2017-08-07 MED ORDER — PREGABALIN ER 165 MG PO TB24: 165.0000 mg | ORAL_TABLET | Freq: Every day | ORAL | 3 refills | Status: DC

## 2017-08-07 NOTE — Telephone Encounter (Signed)
Left patient a voicemail advising [facility] that [facility] and to call office back to schedule a 3 month follow up appointment.

## 2017-08-07 NOTE — Telephone Encounter (Signed)
Patient states that you gave him samples of Lyrica and wanted him to call you back to let you know how it is working.  He states that it is doing great and he would like to continue this medication.  [facility] [place]

## 2017-08-07 NOTE — Telephone Encounter (Signed)
Done. Recommend he recheck progress in 3 months.

## 2017-08-08 ENCOUNTER — Other Ambulatory Visit: Payer: Self-pay | Admitting: Family Medicine

## 2017-08-08 DIAGNOSIS — G609 Hereditary and idiopathic neuropathy, unspecified: Secondary | ICD-10-CM

## 2017-08-08 MED ORDER — PREGABALIN ER 165 MG PO TB24: 165.0000 mg | ORAL_TABLET | Freq: Every day | ORAL | 3 refills | Status: DC

## 2017-08-08 NOTE — Telephone Encounter (Signed)
Thanks, corrected and re-sent.

## 2017-08-08 NOTE — Telephone Encounter (Signed)
Patient states that he called the pharmacy and they said they have not received this.  It looks like it was not sent.

## 2017-08-08 NOTE — Addendum Note (Signed)
Addended by: Jules Schick on: 08/08/2017 10:09 AM   Modules accepted: Orders

## 2017-08-08 NOTE — Telephone Encounter (Signed)
[facility]. It was still set to sample.

## 2017-08-09 ENCOUNTER — Telehealth: Payer: Self-pay | Admitting: Physician Assistant

## 2017-08-09 NOTE — Telephone Encounter (Signed)
[facility] for samples but wont have be able to pick up until Monday.

## 2017-08-09 NOTE — Telephone Encounter (Signed)
Please review. Thanks!  

## 2017-08-09 NOTE — Telephone Encounter (Signed)
This is Simona Huh' patient.  [facility] said [facility] not [facility] 165 CR and they are not sure when it will be available. Pt stated Simona Huh gave him some samples and wanted to know if he could get more to last until "hopefully" [facility] get them. Please call this morning and let pt. Know.

## 2017-08-11 NOTE — Telephone Encounter (Signed)
Sample left at front desk for patient. Patient is aware.

## 2017-08-11 NOTE — Telephone Encounter (Signed)
Pt calling to see if samples are ready.  They are not at the front.  Please contact the patient once samples are at front.  He would like to pick up today.

## 2017-09-09 ENCOUNTER — Telehealth: Payer: Self-pay

## 2017-09-09 DIAGNOSIS — G609 Hereditary and idiopathic neuropathy, unspecified: Secondary | ICD-10-CM

## 2017-09-09 MED ORDER — PREGABALIN 75 MG PO CAPS: 75.0000 mg | ORAL_CAPSULE | Freq: Two times a day (BID) | ORAL | 3 refills | Status: DC

## 2017-09-09 NOTE — Telephone Encounter (Signed)
[facility] [place] they state that the system is not giving them a option for a PA. Patient states he only has 3 tablets left. He is requesting samples or change in medication. Patient states the Lyrica CR is really controlling his symptoms though. Please advise.

## 2017-09-09 NOTE — Telephone Encounter (Signed)
Patient states [facility] [place] and patient's insurance will not cover Lyrica CR any longer. Patient states the medication is working really well. Advised [facility] to see if [facility] needs a PA.   CB# [phone]

## 2017-09-09 NOTE — Telephone Encounter (Signed)
Will send generic Lyrica 75 mg BID to replace the Lyrica-CR. If not covered by insurance, may need to consider the Gabapentin again.

## 2017-09-11 NOTE — Telephone Encounter (Signed)
Patient advised. He states he picked up the RX on 09/10/17 and insurance did cover Lyrica 75 mg.

## 2017-10-14 ENCOUNTER — Telehealth: Payer: Self-pay | Admitting: Family Medicine

## 2017-10-14 NOTE — Telephone Encounter (Signed)
Pt was referred to [facility] March 2019 for insomnia.Appointment has not been scheduled for pt and per pt he does not wish to schedule appointment with psychiatrist at this time

## 2017-12-25 ENCOUNTER — Other Ambulatory Visit: Payer: Self-pay

## 2017-12-25 DIAGNOSIS — G609 Hereditary and idiopathic neuropathy, unspecified: Secondary | ICD-10-CM

## 2017-12-25 NOTE — Telephone Encounter (Signed)
Please review for Dennis.    Thanks,   -Rylin Seavey  

## 2017-12-26 MED ORDER — PREGABALIN 75 MG PO CAPS: 75.0000 mg | ORAL_CAPSULE | Freq: Two times a day (BID) | ORAL | 1 refills | Status: DC

## 2018-02-17 DIAGNOSIS — Z79899 Other long term (current) drug therapy: Secondary | ICD-10-CM | POA: Diagnosis not present

## 2018-02-17 DIAGNOSIS — E291 Testicular hypofunction: Secondary | ICD-10-CM | POA: Diagnosis not present

## 2018-02-17 DIAGNOSIS — N401 Enlarged prostate with lower urinary tract symptoms: Secondary | ICD-10-CM | POA: Diagnosis not present

## 2018-02-20 DIAGNOSIS — Z23 Encounter for immunization: Secondary | ICD-10-CM | POA: Diagnosis not present

## 2018-02-23 ENCOUNTER — Other Ambulatory Visit: Payer: Self-pay | Admitting: Family Medicine

## 2018-02-23 DIAGNOSIS — G609 Hereditary and idiopathic neuropathy, unspecified: Secondary | ICD-10-CM

## 2018-03-02 ENCOUNTER — Other Ambulatory Visit: Payer: Self-pay | Admitting: Family Medicine

## 2018-03-02 DIAGNOSIS — G609 Hereditary and idiopathic neuropathy, unspecified: Secondary | ICD-10-CM

## 2018-03-03 ENCOUNTER — Telehealth: Payer: Self-pay | Admitting: Family Medicine

## 2018-03-03 NOTE — Telephone Encounter (Signed)
Spoke to pt and advised approval.  Called pharmacy and advised that pt was approved for lyrica.

## 2018-03-03 NOTE — Telephone Encounter (Signed)
Pt is requesting an update on the status of his PA for pregabalin (LYRICA) 75 MG capsule. Pt stated that he has new insurance as follows:  [employer] Member RF#[id number] [phone]  Pt is requesting call back on work# listed above. Please advise. Thanks TNP

## 2018-03-09 DIAGNOSIS — N401 Enlarged prostate with lower urinary tract symptoms: Secondary | ICD-10-CM | POA: Diagnosis not present

## 2018-03-09 DIAGNOSIS — E291 Testicular hypofunction: Secondary | ICD-10-CM | POA: Diagnosis not present

## 2018-05-01 DIAGNOSIS — L820 Inflamed seborrheic keratosis: Secondary | ICD-10-CM | POA: Diagnosis not present

## 2018-05-01 DIAGNOSIS — L821 Other seborrheic keratosis: Secondary | ICD-10-CM | POA: Diagnosis not present

## 2018-05-01 DIAGNOSIS — L918 Other hypertrophic disorders of the skin: Secondary | ICD-10-CM | POA: Diagnosis not present

## 2018-05-18 ENCOUNTER — Other Ambulatory Visit: Payer: Self-pay

## 2018-05-18 ENCOUNTER — Telehealth: Payer: Self-pay | Admitting: Family Medicine

## 2018-05-18 ENCOUNTER — Other Ambulatory Visit: Payer: Self-pay | Admitting: Family Medicine

## 2018-05-18 DIAGNOSIS — Z720 Tobacco use: Secondary | ICD-10-CM

## 2018-05-18 MED ORDER — VARENICLINE TARTRATE 0.5 MG X 11 & 1 MG X 42 PO MISC: ORAL | 0 refills | Status: DC

## 2018-05-18 NOTE — Telephone Encounter (Signed)
Sent starter month pack of the generic Chantix. Schedule follow up in a month. Remind patient to NOT drink alcohol with this medication because it can cause increased drunkenness, changes in mood, sleepiness or irritability.

## 2018-05-18 NOTE — Telephone Encounter (Signed)
Patient is requesting that we send in Chantix again for him. [facility] [place]. Please advise. Thanks!

## 2018-05-18 NOTE — Telephone Encounter (Signed)
Please advise 

## 2018-05-18 NOTE — Telephone Encounter (Signed)
See previous note

## 2018-05-18 NOTE — Telephone Encounter (Signed)
LMOVM for pt to return call 

## 2018-05-18 NOTE — Telephone Encounter (Signed)
Patient was advised and scheduled 1 month follow up.

## 2018-05-18 NOTE — Telephone Encounter (Signed)
Pt returned missed call from [facility]. Please call pt back at [phone].  Thanks, [employer]

## 2018-06-09 ENCOUNTER — Encounter: Payer: Self-pay | Admitting: Physician Assistant

## 2018-06-09 ENCOUNTER — Ambulatory Visit (INDEPENDENT_AMBULATORY_CARE_PROVIDER_SITE_OTHER): Payer: 59 | Admitting: Physician Assistant

## 2018-06-09 VITALS — BP 168/96 | HR 76 | Temp 98.1°F | Resp 16 | Wt 207.6 lb

## 2018-06-09 DIAGNOSIS — R05 Cough: Secondary | ICD-10-CM

## 2018-06-09 DIAGNOSIS — R059 Cough, unspecified: Secondary | ICD-10-CM

## 2018-06-09 DIAGNOSIS — J40 Bronchitis, not specified as acute or chronic: Secondary | ICD-10-CM | POA: Diagnosis not present

## 2018-06-09 MED ORDER — PREDNISONE 20 MG PO TABS: 20.0000 mg | ORAL_TABLET | Freq: Every day | ORAL | 0 refills | Status: AC

## 2018-06-09 MED ORDER — ALBUTEROL SULFATE HFA 108 (90 BASE) MCG/ACT IN AERS: 2.0000 | INHALATION_SPRAY | Freq: Four times a day (QID) | RESPIRATORY_TRACT | 2 refills | Status: AC | PRN

## 2018-06-09 MED ORDER — PROMETHAZINE-DM 6.25-15 MG/5ML PO SYRP: 5.0000 mL | ORAL_SOLUTION | Freq: Every evening | ORAL | 0 refills | Status: DC | PRN

## 2018-06-09 NOTE — Progress Notes (Signed)
Patient: Cameron Bennett Male    DOB: 1960/11/16   58 y.o.   MRN: [id number] Visit Date: 06/09/2018  Today's Provider: Trinna Post, PA-C   Chief Complaint  Patient presents with  . URI   Subjective:     HPI  Upper Respiratory Infection: Patient complains of symptoms of a URI, possible sinusitis. Symptoms include cough. Onset of symptoms was 10 days ago, gradually worsening since that time. He also c/o cough described as nocturnal for the past 8 days .  He is drinking plenty of fluids. Evaluation to date: none. Treatment to date: cough suppressants.  BP Readings from Last 3 Encounters:  06/09/18 (!) 168/96  07/29/17 128/84  06/17/17 (!) 150/100       Allergies  Allergen Reactions  . Penicillins Swelling  . Sulfa Antibiotics Rash    Other reaction(s): Unknown     Current Outpatient Medications:  .  Aspirin-Acetaminophen-Caffeine (EXCEDRIN PO), Take 1 tablet by mouth every 6 (six) hours as needed. For pain, Disp: , Rfl:  .  b complex vitamins tablet, Take 1 tablet by mouth daily., Disp: , Rfl:  .  Multiple Vitamin (MULTI-VITAMINS) TABS, Take by mouth., Disp: , Rfl:  .  varenicline (CHANTIX PAK) 0.5 MG X 11 & 1 MG X 42 tablet, Take one 0.5 mg tablet by mouth once daily for 3 days, then increase to one 0.5 mg tablet twice daily for 4 days, then increase to one 1 mg tablet twice daily., Disp: 53 tablet, Rfl: 0 .  albuterol (PROVENTIL HFA;VENTOLIN HFA) 108 (90 Base) MCG/ACT inhaler, Inhale 2 puffs into the lungs every 6 (six) hours as needed for wheezing or shortness of breath., Disp: 1 Inhaler, Rfl: 2 .  predniSONE (DELTASONE) 20 MG tablet, Take 1 tablet (20 mg total) by mouth daily with breakfast for 5 days., Disp: 5 tablet, Rfl: 0 .  pregabalin (LYRICA) 75 MG capsule, TAKE 1 CAPSULE BY MOUTH TWICE A DAY (Patient not taking: Reported on 06/09/2018), Disp: 60 capsule, Rfl: 5 .  promethazine-dextromethorphan (PROMETHAZINE-DM) 6.25-15 MG/5ML syrup, Take 5 mLs by mouth at  bedtime as needed for cough., Disp: 118 mL, Rfl: 0  Review of Systems  HENT: Positive for congestion.   Respiratory: Positive for cough.     Social History   Tobacco Use  . Smoking status: Current Every Day Smoker  . Smokeless tobacco: Never Used  Substance Use Topics  . Alcohol use: No    Alcohol/week: 0.0 standard drinks      Objective:   BP (!) 168/96 (BP Location: Left Arm, Patient Position: Sitting, Cuff Size: Normal)   Pulse 76   Temp 98.1 F (36.7 C) (Oral)   Resp 16   Wt 207 lb 9.6 oz (94.2 kg)   SpO2 95%   BMI 31.80 kg/m  Vitals:   06/09/18 1122  BP: (!) 168/96  Pulse: 76  Resp: 16  Temp: 98.1 F (36.7 C)  TempSrc: Oral  SpO2: 95%  Weight: 207 lb 9.6 oz (94.2 kg)     Physical Exam Cardiovascular:     Rate and Rhythm: Normal rate and regular rhythm.     Heart sounds: Normal heart sounds.  Pulmonary:     Breath sounds: Wheezing present.     Comments: Poor air entry bilaterally.  Skin:    General: Skin is warm and dry.  Neurological:     Mental Status: He is alert. Mental status is at baseline. He is disoriented.  Psychiatric:  Mood and Affect: Mood normal.        Behavior: Behavior normal.         Assessment & Plan    1. Bronchitis  - predniSONE (DELTASONE) 20 MG tablet; Take 1 tablet (20 mg total) by mouth daily with breakfast for 5 days.  Dispense: 5 tablet; Refill: 0 - albuterol (PROVENTIL HFA;VENTOLIN HFA) 108 (90 Base) MCG/ACT inhaler; Inhale 2 puffs into the lungs every 6 (six) hours as needed for wheezing or shortness of breath.  Dispense: 1 Inhaler; Refill: 2  2. Cough  - promethazine-dextromethorphan (PROMETHAZINE-DM) 6.25-15 MG/5ML syrup; Take 5 mLs by mouth at bedtime as needed for cough.  Dispense: 118 mL; Refill: 0  Return if symptoms worsen or fail to improve.  The entirety of the information documented in the History of Present Illness, Review of Systems and Physical Exam were personally obtained by me. Portions of  this information were initially documented by Lynford Humphrey, CMA and reviewed by me for thoroughness and accuracy.      Trinna Post, PA-C  [facility]

## 2018-06-09 NOTE — Patient Instructions (Signed)
Acute Bronchitis, Adult Acute bronchitis is when air tubes (bronchi) in the lungs suddenly get swollen. The condition can make it hard to breathe. It can also cause these symptoms:  A cough.  Coughing up clear, yellow, or green mucus.  Wheezing.  Chest congestion.  Shortness of breath.  A fever.  Body aches.  Chills.  A sore throat. Follow these instructions at home:  Medicines  Take over-the-counter and prescription medicines only as told by your doctor.  If you were prescribed an antibiotic medicine, take it as told by your doctor. Do not stop taking the antibiotic even if you start to feel better. General instructions  Rest.  Drink enough fluids to keep your pee (urine) pale yellow.  Avoid smoking and secondhand smoke. If you smoke and you need help quitting, ask your doctor. Quitting will help your lungs heal faster.  Use an inhaler, cool mist vaporizer, or humidifier as told by your doctor.  Keep all follow-up visits as told by your doctor. This is important. How is this prevented? To lower your risk of getting this condition again:  Wash your hands often with soap and water. If you cannot use soap and water, use hand sanitizer.  Avoid contact with people who have cold symptoms.  Try not to touch your hands to your mouth, nose, or eyes.  Make sure to get the flu shot every year. Contact a doctor if:  Your symptoms do not get better in 2 weeks. Get help right away if:  You cough up blood.  You have chest pain.  You have very bad shortness of breath.  You become dehydrated.  You faint (pass out) or keep feeling like you are going to pass out.  You keep throwing up (vomiting).  You have a very bad headache.  Your fever or chills gets worse. This information is not intended to replace advice given to you by your health care provider. Make sure you discuss any questions you have with your health care provider. Document Released: 10/23/2007 Document  Revised: 12/18/2016 Document Reviewed: 10/25/2015 Elsevier Interactive Patient Education  2019 Elsevier Inc.  

## 2018-06-18 ENCOUNTER — Encounter: Payer: Self-pay | Admitting: Family Medicine

## 2018-06-18 ENCOUNTER — Ambulatory Visit (INDEPENDENT_AMBULATORY_CARE_PROVIDER_SITE_OTHER): Payer: 59 | Admitting: Family Medicine

## 2018-06-18 VITALS — BP 120/80 | HR 85 | Temp 98.1°F | Wt 205.8 lb

## 2018-06-18 DIAGNOSIS — F339 Major depressive disorder, recurrent, unspecified: Secondary | ICD-10-CM | POA: Diagnosis not present

## 2018-06-18 DIAGNOSIS — J40 Bronchitis, not specified as acute or chronic: Secondary | ICD-10-CM

## 2018-06-18 DIAGNOSIS — Z720 Tobacco use: Secondary | ICD-10-CM | POA: Diagnosis not present

## 2018-06-18 MED ORDER — DOXYCYCLINE HYCLATE 100 MG PO TABS: 100.0000 mg | ORAL_TABLET | Freq: Two times a day (BID) | ORAL | 0 refills | Status: DC

## 2018-06-18 MED ORDER — PREDNISONE 10 MG PO TABS: 10.0000 mg | ORAL_TABLET | Freq: Every day | ORAL | 0 refills | Status: DC

## 2018-06-18 MED ORDER — BENZONATATE 200 MG PO CAPS: 200.0000 mg | ORAL_CAPSULE | Freq: Two times a day (BID) | ORAL | 0 refills | Status: DC | PRN

## 2018-06-18 NOTE — Progress Notes (Signed)
Patient: Cameron Bennett Male    DOB: 28-May-1960   58 y.o.   MRN: [id number] Visit Date: 06/18/2018  Today's Provider: Vernie Murders, PA   Chief Complaint  Patient presents with  . URI  . Nicotine Dependence   Subjective:    URI   The current episode started 1 to 4 weeks ago. The problem has been unchanged. There has been no fever. Associated symptoms include congestion, coughing and sinus pain. He has tried decongestant for the symptoms. The treatment provided mild relief.  Nicotine Dependence  Presents for follow-up visit. His urge triggers include company of smokers. The symptoms have been improving. He smokes < 1/2 a pack of cigarettes per day. Compliance with prior treatments has been variable.   No past medical history on file. Patient Active Problem List   Diagnosis Date Noted  . Back pain, chronic 11/02/2014  . Dysfunction of eustachian tube 11/02/2014  . H/O drug abuse ([place]) 11/02/2014  . History of surgery to major organs, presenting hazards to health 11/02/2014  . Personal history of urinary calculi 11/02/2014  . 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 Adiposity 11/02/2014  . Dyssomnia 11/02/2014  . 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 Anxiety, generalized 11/08/2005   Past Surgical History:  Procedure Laterality Date  . ELBOW SURGERY Right 2008  . HEMORROIDECTOMY    . HERNIA REPAIR    . LUMBAR LAMINECTOMY  2012   Family History  Problem Relation Age of Onset  . COPD Mother   . Lung cancer Father   . Diabetes Sister   . Diabetes Brother    Allergies  Allergen Reactions  . Penicillins Swelling  . Sulfa Antibiotics Rash    Other reaction(s): Unknown    Current Outpatient Medications:  .  albuterol (PROVENTIL HFA;VENTOLIN HFA) 108 (90 Base) MCG/ACT inhaler, Inhale 2 puffs into the lungs every 6 (six) hours as needed for wheezing or shortness of breath., Disp: 1 Inhaler, Rfl: 2 .  Aspirin-Acetaminophen-Caffeine (EXCEDRIN PO), Take 1 tablet by mouth every  6 (six) hours as needed. For pain, Disp: , Rfl:  .  b complex vitamins tablet, Take 1 tablet by mouth daily., Disp: , Rfl:  .  Multiple Vitamin (MULTI-VITAMINS) TABS, Take by mouth., Disp: , Rfl:  .  pregabalin (LYRICA) 75 MG capsule, TAKE 1 CAPSULE BY MOUTH TWICE A DAY, Disp: 60 capsule, Rfl: 5 .  promethazine-dextromethorphan (PROMETHAZINE-DM) 6.25-15 MG/5ML syrup, Take 5 mLs by mouth at bedtime as needed for cough., Disp: 118 mL, Rfl: 0 .  varenicline (CHANTIX PAK) 0.5 MG X 11 & 1 MG X 42 tablet, Take one 0.5 mg tablet by mouth once daily for 3 days, then increase to one 0.5 mg tablet twice daily for 4 days, then increase to one 1 mg tablet twice daily., Disp: 53 tablet, Rfl: 0  Review of Systems  HENT: Positive for congestion and sinus pain.   Respiratory: Positive for cough.   Genitourinary: Negative.   Neurological: Negative.    Social History   Tobacco Use  . Smoking status: Current Every Day Smoker  . Smokeless tobacco: Never Used  Substance Use Topics  . Alcohol use: No    Alcohol/week: 0.0 standard drinks     Objective:   BP 120/80 (BP Location: Left Arm, Patient Position: Sitting, Cuff Size: Normal)   Pulse 85   Temp 98.1 F (36.7 C) (Oral)   Wt 205 lb 12.8 oz (93.4 kg)   SpO2 96%   BMI 31.52 kg/m  Wt Readings from Last 3 Encounters:  06/18/18 205 lb 12.8 oz (93.4 kg)  06/09/18 207 lb 9.6 oz (94.2 kg)  06/17/17 202 lb 12.8 oz (92 kg)   Vitals:   06/18/18 1538  BP: 120/80  Pulse: 85  Temp: 98.1 F (36.7 C)  TempSrc: Oral  SpO2: 96%  Weight: 205 lb 12.8 oz (93.4 kg)   Physical Exam Constitutional:      General: He is not in acute distress.    Appearance: He is well-developed.  HENT:     Head: Normocephalic and atraumatic.     Right Ear: Hearing and tympanic membrane normal.     Left Ear: Hearing and tympanic membrane normal.     Nose: Nose normal.     Mouth/Throat:     Pharynx: Oropharynx is clear.  Eyes:     General: Lids are normal. No scleral  icterus.       Right eye: No discharge.        Left eye: No discharge.     Conjunctiva/sclera: Conjunctivae normal.  Cardiovascular:     Rate and Rhythm: Normal rate and regular rhythm.     Heart sounds: Normal heart sounds.  Pulmonary:     Effort: Pulmonary effort is normal. No respiratory distress.     Comments: Slightly coarse breath sounds. Abdominal:     General: Bowel sounds are normal.     Palpations: Abdomen is soft.  Musculoskeletal: Normal range of motion.        General: Tenderness present.     Comments: Soreness at the inferior border of the left scapula without rash.  Skin:    Findings: No lesion or rash.  Neurological:     Mental Status: He is alert and oriented to person, place, and time.  Psychiatric:        Speech: Speech normal.        Behavior: Behavior normal.        Thought Content: Thought content normal.       Assessment & Plan    1. Bronchitis Some improvement when he was initially using the prednisone 20 mg at breakfast for 5 days. Still get some help with wheeze/tighness control using the Albuterol-HFA Inhaler prn. Still some sputum production and hacking cough. Some sharp pains around the left scapula with deep breath or cough. May apply moist heat and given 12 Prednisone taper with Doxycycline BID. Recheck in 10-14 days if no better. May need chest x-ray, labs and possible spirometry at that time. - predniSONE (DELTASONE) 10 MG tablet; Take 1 tablet (10 mg total) by mouth daily with breakfast. Taper down every 2 days starting at 6 tablets by mouth (6,6,5,5,4,4,3,3,2,2,1,1)  Dispense: 42 tablet; Refill: 0 - doxycycline (VIBRA-TABS) 100 MG tablet; Take 1 tablet (100 mg total) by mouth 2 (two) times daily.  Dispense: 20 tablet; Refill: 0 - benzonatate (TESSALON) 200 MG capsule; Take 1 capsule (200 mg total) by mouth 2 (two) times daily as needed for cough.  Dispense: 20 capsule; Refill: 0  2. Tobacco use Has decreased smoking to 1 ppd from 2 ppd.  Tolerating Chantix without side effects. Encouraged to stop all smoking. Recheck 1-2 months or as needed.  3. Depression, recurrent ([place]) Feeling well without depressive symptoms recently. No suicidal ideation. No further illicit drug use of ETOH. PHQ-2 normal.     Vernie Murders, PA  [facility]

## 2018-06-23 ENCOUNTER — Other Ambulatory Visit: Payer: Self-pay | Admitting: Family Medicine

## 2018-06-23 DIAGNOSIS — Z720 Tobacco use: Secondary | ICD-10-CM

## 2018-06-23 MED ORDER — VARENICLINE TARTRATE 1 MG PO TABS: 1.0000 mg | ORAL_TABLET | Freq: Two times a day (BID) | ORAL | 3 refills | Status: DC

## 2018-06-30 ENCOUNTER — Encounter: Payer: Self-pay | Admitting: Family Medicine

## 2018-06-30 DIAGNOSIS — R0781 Pleurodynia: Secondary | ICD-10-CM | POA: Diagnosis not present

## 2018-07-06 ENCOUNTER — Telehealth: Payer: Self-pay | Admitting: Family Medicine

## 2018-07-06 ENCOUNTER — Encounter: Payer: Self-pay | Admitting: Family Medicine

## 2018-07-06 ENCOUNTER — Ambulatory Visit (INDEPENDENT_AMBULATORY_CARE_PROVIDER_SITE_OTHER): Payer: 59 | Admitting: Family Medicine

## 2018-07-06 VITALS — BP 100/80 | HR 87

## 2018-07-06 DIAGNOSIS — R0781 Pleurodynia: Secondary | ICD-10-CM | POA: Diagnosis not present

## 2018-07-06 MED ORDER — METHOCARBAMOL 500 MG PO TABS: 500.0000 mg | ORAL_TABLET | Freq: Three times a day (TID) | ORAL | 0 refills | Status: DC | PRN

## 2018-07-06 MED ORDER — NAPROXEN 500 MG PO TBEC: 500.0000 mg | DELAYED_RELEASE_TABLET | Freq: Two times a day (BID) | ORAL | 0 refills | Status: DC

## 2018-07-06 NOTE — Telephone Encounter (Signed)
Checking if Xrays and notes have been sent over from [place] [facility].  Please advise.  Thanks, [employer]

## 2018-07-06 NOTE — Progress Notes (Signed)
Patient: Cameron Bennett Male    DOB: 1960/12/12   58 y.o.   MRN: [id number] Visit Date: 07/06/2018  Today's Provider: Vernie Murders, PA   Complaint: Left rib pain  Subjective:     HPI  Developed pain in the left lower ribs with bronchitis a month ago. Has been treated with Promethazine-DM, Prednisone 12 day taper, Doxycycline and Tessalon Perles on 06-18-18. Still has some cough with morning sputum production. Rib pain persisting and went to [facility] on 06-30-18 and x-ray did not show any pneumonia or rib fracture.   No past medical history on file. Past Surgical History:  Procedure Laterality Date  . ELBOW SURGERY Right 2008  . HEMORROIDECTOMY    . HERNIA REPAIR    . LUMBAR LAMINECTOMY  2012   Family History  Problem Relation Age of Onset  . COPD Mother   . Lung cancer Father   . Diabetes Sister   . Diabetes Brother    Allergies  Allergen Reactions  . Penicillins Swelling  . Sulfa Antibiotics Rash    Other reaction(s): Unknown    Current Outpatient Medications:  .  albuterol (PROVENTIL HFA;VENTOLIN HFA) 108 (90 Base) MCG/ACT inhaler, Inhale 2 puffs into the lungs every 6 (six) hours as needed for wheezing or shortness of breath., Disp: 1 Inhaler, Rfl: 2 .  Aspirin-Acetaminophen-Caffeine (EXCEDRIN PO), Take 1 tablet by mouth every 6 (six) hours as needed. For pain, Disp: , Rfl:  .  b complex vitamins tablet, Take 1 tablet by mouth daily., Disp: , Rfl:  .  methocarbamol (ROBAXIN) 500 MG tablet, Take 1 tablet (500 mg total) by mouth every 8 (eight) hours as needed for muscle spasms., Disp: 30 tablet, Rfl: 0 .  Multiple Vitamin (MULTI-VITAMINS) TABS, Take by mouth., Disp: , Rfl:  .  naproxen (EC NAPROSYN) 500 MG EC tablet, Take 1 tablet (500 mg total) by mouth 2 (two) times daily with a meal., Disp: 30 tablet, Rfl: 0 .  pregabalin (LYRICA) 75 MG capsule, TAKE 1 CAPSULE BY MOUTH TWICE A DAY, Disp: 60 capsule, Rfl: 5 .  varenicline (CHANTIX) 1 MG tablet, Take 1  tablet (1 mg total) by mouth 2 (two) times daily., Disp: 60 tablet, Rfl: 3  Review of Systems  Constitutional: Negative.   HENT: Negative.   Respiratory: Positive for cough.   Cardiovascular: Positive for chest pain. Negative for palpitations.  Gastrointestinal: Negative.   Genitourinary: Negative.    Social History   Tobacco Use  . Smoking status: Current Every Day Smoker  . Smokeless tobacco: Never Used  Substance Use Topics  . Alcohol use: No    Alcohol/week: 0.0 standard drinks     Objective:   BP 100/80 (BP Location: Right Arm, Patient Position: Sitting, Cuff Size: Large)   Pulse 87   SpO2 98%  Vitals:   07/06/18 1646  BP: 100/80  Pulse: 87  SpO2: 98%   Physical Exam Constitutional:      General: He is not in acute distress.    Appearance: He is well-developed.  HENT:     Head: Normocephalic and atraumatic.     Right Ear: Hearing normal.     Left Ear: Hearing normal.     Nose: Nose normal.  Eyes:     General: Lids are normal. No scleral icterus.       Right eye: No discharge.        Left eye: No discharge.     Conjunctiva/sclera: Conjunctivae normal.  Cardiovascular:     Rate and Rhythm: Regular rhythm.     Heart sounds: Normal heart sounds.  Pulmonary:     Effort: Pulmonary effort is normal. No respiratory distress.     Breath sounds: No wheezing, rhonchi or rales.     Comments: Tender left lower anterolateral ribs to palpate or cough. Chest:     Chest wall: Tenderness present.  Musculoskeletal: Normal range of motion.  Skin:    Findings: No lesion or rash.  Neurological:     Mental Status: He is alert and oriented to person, place, and time.  Psychiatric:        Speech: Speech normal.        Behavior: Behavior normal.        Thought Content: Thought content normal.       Assessment & Plan    1. Pain in rib Slow to improve left rib pain. Bronchitis clear. Pain is still sharp intermittently but better controlled with rib belt and ice pack that  initial pain a month ago. Still has sharp pain with cough first thing in the morning with muscle spasms. Does not feel the Ibuprofen is helping and worries it may cause GI irritation. Given Methocarbamol 500 mg TID for spasms and EC-Naprosyn 500 mg BID for pain. Continue rib belt use for support and work on stopping all smoking. Call report of progress in 10 days. X-rays of chest/ribs on 06-30-18 were negative for rib fractures or pneumonia.      Vernie Murders, PA  [facility]

## 2018-07-06 NOTE — Telephone Encounter (Signed)
Don't see any notes or x-ray reports in chart from [facility]. Check with them to be sure it was sent here and not [employer]

## 2018-07-06 NOTE — Telephone Encounter (Signed)
Please advise 

## 2018-07-06 NOTE — Telephone Encounter (Signed)
Jefm Bryant is faxing results and ov notes over now.

## 2018-07-07 NOTE — Telephone Encounter (Signed)
Results received and given to [facility].

## 2018-08-17 ENCOUNTER — Other Ambulatory Visit: Payer: Self-pay | Admitting: Family Medicine

## 2018-08-17 DIAGNOSIS — G609 Hereditary and idiopathic neuropathy, unspecified: Secondary | ICD-10-CM

## 2018-08-17 MED ORDER — PREGABALIN 75 MG PO CAPS: 75.0000 mg | ORAL_CAPSULE | Freq: Two times a day (BID) | ORAL | 4 refills | Status: DC

## 2018-09-21 ENCOUNTER — Telehealth: Payer: Self-pay | Admitting: Family Medicine

## 2018-09-21 NOTE — Telephone Encounter (Signed)
Patient never did have any fever with cough over January and February 2020. He had received an e-mail announcement about COVID-19 antibody tests becoming available soon. He is not having any symptoms now and no fevers. Don't think he has to get this test but it is no widely available for [place] to get it done and criteria for who should get tested not decided yet. He will continue to monitor the availability situation.

## 2018-09-21 NOTE — Telephone Encounter (Signed)
pt called asking if Simona Huh would review his records from [place] and feb for his cough and fever and recommend if he should get the test from [place] to see if he could have possibly have had the covid virus  CB#  [phone]  Thanks teri

## 2018-09-21 NOTE — Telephone Encounter (Signed)
Please advise 

## 2018-11-18 ENCOUNTER — Ambulatory Visit (INDEPENDENT_AMBULATORY_CARE_PROVIDER_SITE_OTHER): Payer: 59 | Admitting: Psychology

## 2018-11-18 DIAGNOSIS — F411 Generalized anxiety disorder: Secondary | ICD-10-CM | POA: Diagnosis not present

## 2018-11-30 ENCOUNTER — Ambulatory Visit (INDEPENDENT_AMBULATORY_CARE_PROVIDER_SITE_OTHER): Payer: 59 | Admitting: Psychology

## 2018-11-30 DIAGNOSIS — F411 Generalized anxiety disorder: Secondary | ICD-10-CM | POA: Diagnosis not present

## 2018-12-09 ENCOUNTER — Ambulatory Visit (INDEPENDENT_AMBULATORY_CARE_PROVIDER_SITE_OTHER): Payer: 59 | Admitting: Psychology

## 2018-12-09 DIAGNOSIS — F411 Generalized anxiety disorder: Secondary | ICD-10-CM | POA: Diagnosis not present

## 2018-12-14 ENCOUNTER — Ambulatory Visit: Payer: 59 | Admitting: Psychology

## 2019-01-07 ENCOUNTER — Other Ambulatory Visit: Payer: Self-pay | Admitting: Family Medicine

## 2019-01-07 ENCOUNTER — Telehealth: Payer: Self-pay | Admitting: Family Medicine

## 2019-01-07 DIAGNOSIS — G609 Hereditary and idiopathic neuropathy, unspecified: Secondary | ICD-10-CM

## 2019-01-07 NOTE — Telephone Encounter (Signed)
Rx has been called in  

## 2019-01-07 NOTE — Telephone Encounter (Signed)
°  pregabalin (LYRICA) 75 MG capsule - pt is out of refills.  Needing this filled by Friday.  Pt is leaving for vacation and needing this filled to take with him.  Please fill at:  [facility] [place] (Phone) [phone] (Fax)   Thanks, [facility]

## 2019-01-26 ENCOUNTER — Ambulatory Visit: Payer: 59 | Admitting: Psychology

## 2019-02-10 ENCOUNTER — Ambulatory Visit (INDEPENDENT_AMBULATORY_CARE_PROVIDER_SITE_OTHER): Payer: 59 | Admitting: Psychology

## 2019-02-10 DIAGNOSIS — F411 Generalized anxiety disorder: Secondary | ICD-10-CM | POA: Diagnosis not present

## 2019-02-23 ENCOUNTER — Ambulatory Visit (INDEPENDENT_AMBULATORY_CARE_PROVIDER_SITE_OTHER): Payer: 59 | Admitting: Psychology

## 2019-02-23 DIAGNOSIS — F411 Generalized anxiety disorder: Secondary | ICD-10-CM | POA: Diagnosis not present

## 2019-03-09 ENCOUNTER — Ambulatory Visit (INDEPENDENT_AMBULATORY_CARE_PROVIDER_SITE_OTHER): Payer: 59 | Admitting: Psychology

## 2019-03-09 ENCOUNTER — Other Ambulatory Visit: Payer: Self-pay | Admitting: Family Medicine

## 2019-03-09 DIAGNOSIS — F411 Generalized anxiety disorder: Secondary | ICD-10-CM | POA: Diagnosis not present

## 2019-03-09 DIAGNOSIS — G609 Hereditary and idiopathic neuropathy, unspecified: Secondary | ICD-10-CM

## 2019-03-10 NOTE — Telephone Encounter (Signed)
Sent to me by mistake 

## 2019-03-23 ENCOUNTER — Ambulatory Visit: Payer: 59 | Admitting: Psychology

## 2019-04-13 ENCOUNTER — Ambulatory Visit (INDEPENDENT_AMBULATORY_CARE_PROVIDER_SITE_OTHER): Payer: 59 | Admitting: Psychology

## 2019-04-13 DIAGNOSIS — F411 Generalized anxiety disorder: Secondary | ICD-10-CM

## 2019-04-26 ENCOUNTER — Other Ambulatory Visit: Payer: Self-pay

## 2019-04-26 DIAGNOSIS — Z20822 Contact with and (suspected) exposure to covid-19: Secondary | ICD-10-CM

## 2019-04-27 LAB — NOVEL CORONAVIRUS, NAA: SARS-CoV-2, NAA: NOT DETECTED

## 2019-05-04 ENCOUNTER — Ambulatory Visit (INDEPENDENT_AMBULATORY_CARE_PROVIDER_SITE_OTHER): Payer: 59 | Admitting: Psychology

## 2019-05-04 DIAGNOSIS — F411 Generalized anxiety disorder: Secondary | ICD-10-CM

## 2019-05-10 ENCOUNTER — Other Ambulatory Visit: Payer: Self-pay | Admitting: Family Medicine

## 2019-05-10 DIAGNOSIS — G609 Hereditary and idiopathic neuropathy, unspecified: Secondary | ICD-10-CM

## 2019-05-11 ENCOUNTER — Telehealth: Payer: Self-pay | Admitting: Family Medicine

## 2019-05-11 DIAGNOSIS — G609 Hereditary and idiopathic neuropathy, unspecified: Secondary | ICD-10-CM

## 2019-05-11 NOTE — Telephone Encounter (Signed)
Patient is checking status on medication refill Call back [phone]

## 2019-05-11 NOTE — Telephone Encounter (Signed)
Requested medication (s) are due for refill today: yes  Requested medication (s) are on the active medication list: yes  Last refill:  03/11/19  Future visit scheduled: no  Notes to clinic:  this is another request for medication to be refilled. Refused Yesterday (05/10/2019):  Refill not appropriate (Patient should have enough to last until 06/11/19.     Medication not delegated to NT to refill  Requested Prescriptions  Pending Prescriptions Disp Refills   pregabalin (LYRICA) 75 MG capsule [Pharmacy Med Name: PREGABALIN 75 MG CAPSULE] 60 capsule 1    Sig: TAKE 1 CAPSULE BY MOUTH TWICE A DAY      Not Delegated - Neurology:  Anticonvulsants - Controlled Failed - 05/11/2019  3:02 PM      Failed - This refill cannot be delegated      Passed - Valid encounter within last 12 months    Recent Outpatient Visits           10 months ago Pain in rib   [place], Vickki Muff, [place]   10 months ago [place], Vickki Muff, [place]   11 months ago [place], [place], PA-C   1 year ago Idiopathic peripheral neuropathy   [place], [place]   1 year ago Viral upper respiratory tract infection   [place], [place], [place]

## 2019-05-12 NOTE — Telephone Encounter (Signed)
Pt following up on refill request for  pregabalin (LYRICA) 75 MG capsule  Pt needs appt.  Pt has been scheduled for 05/25/2019 at 8 am. Can you send 30 days into   [facility] [place] Phone:  [phone]  Fax:  [phone]

## 2019-05-18 ENCOUNTER — Ambulatory Visit (INDEPENDENT_AMBULATORY_CARE_PROVIDER_SITE_OTHER): Payer: 59 | Admitting: Psychology

## 2019-05-18 DIAGNOSIS — F411 Generalized anxiety disorder: Secondary | ICD-10-CM

## 2019-05-25 ENCOUNTER — Telehealth: Payer: 59 | Admitting: Family Medicine

## 2019-06-08 ENCOUNTER — Ambulatory Visit (INDEPENDENT_AMBULATORY_CARE_PROVIDER_SITE_OTHER): Payer: 59 | Admitting: Psychology

## 2019-06-08 DIAGNOSIS — F411 Generalized anxiety disorder: Secondary | ICD-10-CM

## 2019-06-10 ENCOUNTER — Telehealth: Payer: Self-pay | Admitting: Family Medicine

## 2019-06-10 DIAGNOSIS — G609 Hereditary and idiopathic neuropathy, unspecified: Secondary | ICD-10-CM

## 2019-06-10 NOTE — Telephone Encounter (Signed)
Pt called saying his insurance changed at the first of the year and he went to get his Lyrica the pharmacy [facility] told him his cost was going to be 110.00.  He had been use to paying 20.00 per prescription. He checked around and [facility] using the [facility] it will cost him around 15.00 to 20.00.  He would like [facility] to transfer his rx to [facility].  Please let patient know when this is done.   CB#  [phone]

## 2019-06-11 MED ORDER — PREGABALIN 75 MG PO CAPS: 75.0000 mg | ORAL_CAPSULE | Freq: Two times a day (BID) | ORAL | 1 refills | Status: DC

## 2019-06-11 NOTE — Telephone Encounter (Signed)
Rx sent to [facility].  Please call the old pharmacy and asked them to take it off file.

## 2019-06-11 NOTE — Telephone Encounter (Signed)
Left detailed message on voice mail. [facility]

## 2019-06-11 NOTE — Telephone Encounter (Signed)
Please review for Dennis.    Thanks,   -Keaisha Sublette  

## 2019-06-22 ENCOUNTER — Ambulatory Visit (INDEPENDENT_AMBULATORY_CARE_PROVIDER_SITE_OTHER): Payer: 59 | Admitting: Psychology

## 2019-06-22 DIAGNOSIS — F411 Generalized anxiety disorder: Secondary | ICD-10-CM

## 2019-07-02 ENCOUNTER — Encounter: Payer: Self-pay | Admitting: Family Medicine

## 2019-07-02 ENCOUNTER — Other Ambulatory Visit: Payer: Self-pay

## 2019-07-02 ENCOUNTER — Ambulatory Visit (INDEPENDENT_AMBULATORY_CARE_PROVIDER_SITE_OTHER): Payer: 59 | Admitting: Family Medicine

## 2019-07-02 VITALS — BP 104/65 | HR 83 | Temp 97.3°F | Resp 16 | Ht 68.0 in | Wt 198.0 lb

## 2019-07-02 DIAGNOSIS — F1911 Other psychoactive substance abuse, in remission: Secondary | ICD-10-CM | POA: Diagnosis not present

## 2019-07-02 DIAGNOSIS — R002 Palpitations: Secondary | ICD-10-CM | POA: Diagnosis not present

## 2019-07-02 DIAGNOSIS — R5383 Other fatigue: Secondary | ICD-10-CM

## 2019-07-02 DIAGNOSIS — G609 Hereditary and idiopathic neuropathy, unspecified: Secondary | ICD-10-CM

## 2019-07-02 DIAGNOSIS — Z720 Tobacco use: Secondary | ICD-10-CM | POA: Diagnosis not present

## 2019-07-02 NOTE — Progress Notes (Signed)
Patient: Cameron Bennett Male    DOB: June 04, 1960   59 y.o.   MRN: [id number] Visit Date: 07/02/2019  Today's Provider: Vernie Murders, PA   Chief Complaint  Patient presents with  . Fatigue   Subjective:     Fatigue Patient complains of fatigue. Symptoms began 2 months ago. Symptoms of his fatigue have been anxiousness and general malaise. Patient describes the following psychological symptoms: stress at work. Patient reports palpitation, shortness of breath with actvity. Symptoms are unchanged. Symptom severity: symptoms bothersome, but easily able to carry out all usual work/school/family activities. Previous visits for this problem: none.    Patient Active Problem List   Diagnosis Date Noted  . Depression, recurrent ([place]) 06/18/2018  . Back pain, chronic 11/02/2014  . Dysfunction of eustachian tube 11/02/2014  . H/O drug abuse ([place]) 11/02/2014  . History of surgery to major organs, presenting hazards to health 11/02/2014  . Personal history of urinary calculi 11/02/2014  . 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 Adiposity 11/02/2014  . Dyssomnia 11/02/2014  . 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 Anxiety, generalized 11/08/2005   Past Surgical History:  Procedure Laterality Date  . ELBOW SURGERY Right 2008  . HEMORROIDECTOMY    . HERNIA REPAIR    . LUMBAR LAMINECTOMY  2012   Family History  Problem Relation Age of Onset  . COPD Mother   . Lung cancer Father   . Diabetes Sister   . Diabetes Brother    Allergies  Allergen Reactions  . Penicillins Swelling  . Sulfa Antibiotics Rash    Other reaction(s): Unknown    Current Outpatient Medications:  .  albuterol (PROVENTIL HFA;VENTOLIN HFA) 108 (90 Base) MCG/ACT inhaler, Inhale 2 puffs into the lungs every 6 (six) hours as needed for wheezing or shortness of breath., Disp: 1 Inhaler, Rfl: 2 .  Aspirin-Acetaminophen-Caffeine (EXCEDRIN PO), Take 1 tablet by mouth every 6 (six) hours as needed. For pain, Disp: , Rfl:    .  b complex vitamins tablet, Take 1 tablet by mouth daily., Disp: , Rfl:  .  Multiple Vitamin (MULTI-VITAMINS) TABS, Take by mouth., Disp: , Rfl:  .  pregabalin (LYRICA) 75 MG capsule, Take 1 capsule (75 mg total) by mouth 2 (two) times daily., Disp: 60 capsule, Rfl: 1  Review of Systems  Constitutional: Positive for activity change, chills and fatigue. Negative for fever and unexpected weight change.  Respiratory: Negative for cough, shortness of breath and wheezing.   Cardiovascular: Positive for palpitations. Negative for chest pain.  Endocrine: Positive for cold intolerance.  Neurological: Negative for dizziness, tremors, weakness, light-headedness and headaches.  Hematological: Bruises/bleeds easily.   Social History   Tobacco Use  . Smoking status: Current Every Day Smoker  . Smokeless tobacco: Never Used  Substance Use Topics  . Alcohol use: No    Alcohol/week: 0.0 standard drinks     Objective:   BP 104/65 (BP Location: Right Arm, Patient Position: Sitting, Cuff Size: Large)   Pulse 83   Temp (!) 97.3 F (36.3 C) (Temporal)   Resp 16   Ht 5\' 8"  (1.727 m)   Wt 198 lb (89.8 kg)   BMI 30.11 kg/m  Vitals:   07/02/19 1459  BP: 104/65  Pulse: 83  Resp: 16  Temp: (!) 97.3 F (36.3 C)  TempSrc: Temporal  Weight: 198 lb (89.8 kg)  Height: 5\' 8"  (1.727 m)  Body mass index is 30.11 kg/m.  Physical Exam Constitutional:  General: He is not in acute distress.    Appearance: He is well-developed.  HENT:     Head: Normocephalic and atraumatic.     Right Ear: Hearing normal.     Left Ear: Hearing normal.     Nose: Nose normal.  Eyes:     General: Lids are normal. No scleral icterus.       Right eye: No discharge.        Left eye: No discharge.     Conjunctiva/sclera: Conjunctivae normal.  Cardiovascular:     Rate and Rhythm: Normal rate and regular rhythm.     Heart sounds: Normal heart sounds.  Pulmonary:     Effort: Pulmonary effort is normal. No  respiratory distress.  Musculoskeletal:        General: Normal range of motion.     Cervical back: Neck supple.  Skin:    Findings: No lesion or rash.  Neurological:     Mental Status: He is alert and oriented to person, place, and time.  Psychiatric:        Speech: Speech normal.        Behavior: Behavior normal.        Thought Content: Thought content normal.       Assessment & Plan    1. Palpitations Noticed tachycardia/palpitations climbing stairs to get into the office today. Some fatigue but only slightly short PR intervals on EKG today. Fatigue without dizziness and no apparent dyspnea during office visit. Recheck labs, increase water intake and eat regular meals. Restrict caffeine or energy drinks and decrease smoking. Follow up pending reports. - CBC with Differential/Platelet - Comprehensive metabolic panel - Lipid panel - TSH - EKG 12-Lead  2. Fatigue, unspecified type Onset with easy bruisability over the past 2 months. COVID test has been negative. No fever, loss of taste or body aches. Recheck labs. Is very concerned about family history of leukemia in maternal grandmother. Recheck routine labs and follow pending reports. - CBC with Differential/Platelet - Comprehensive metabolic panel - Lipid panel - TSH  3. H/O drug abuse ([place]) Has been sober for 4-5 years now and noticing fatigue issue with tachycardia intermittently. Still going to NA and no narcotics. Recheck CBC, CMP and TSH. - CBC with Differential/Platelet - Comprehensive metabolic panel - TSH  4. Tobacco use Still smoking 1-2 ppd. Some shortness of breath occasionally. Recent fatigue and palpitations. No hemoptysis. Encouraged to stop all tobacco use.  5. Idiopathic peripheral neuropathy Unchanged tingling and pains in the lateral side of each foot. No sores. Good pulses. Getting better control of discomfort with using Lyrica 75 mg BID. Initially tried to use it at 2 tablets at bedtime only, but this  did not control discomfort very well all day. Will recheck routine labs. - CBC with Differential/Platelet - Comprehensive metabolic panel - TSH     Vernie Murders, PA  [facility]

## 2019-07-05 ENCOUNTER — Telehealth: Payer: Self-pay | Admitting: Family Medicine

## 2019-07-05 ENCOUNTER — Other Ambulatory Visit: Payer: Self-pay | Admitting: Family Medicine

## 2019-07-05 ENCOUNTER — Encounter: Payer: Self-pay | Admitting: Family Medicine

## 2019-07-05 DIAGNOSIS — Z8619 Personal history of other infectious and parasitic diseases: Secondary | ICD-10-CM

## 2019-07-05 MED ORDER — ACYCLOVIR 5 % EX CREA: 1.0000 "application " | TOPICAL_CREAM | CUTANEOUS | 0 refills | Status: DC

## 2019-07-05 NOTE — Telephone Encounter (Signed)
Requested medication (s) are due for refill today: yes  Requested medication (s) are on the active medication list:yes  Last refill: today  Future visit scheduled: No  Notes to clinic:  Pharmacy comment: Alternative Requested:NON-FORMULARY. 2.5K OUT OF POCKET. PLEASE SEND NEW RX FOR ALTERNATIVE OR DO PA. THANKS.    Requested Prescriptions  Pending Prescriptions Disp Refills   acyclovir (ZOVIRAX) 200 MG capsule [Pharmacy Med Name: ACYCLOVIR 200 MG CAPSULE]  0    Sig: Please specify directions, refills and quantity      Antimicrobials:  Antiviral Agents - Anti-Herpetic Passed - 07/05/2019 12:46 PM      Passed - Valid encounter within last 12 months    Recent Outpatient Visits           3 days ago Palpitations   [place], [place]   12 months ago Pain in rib   [facility], Vickki Muff, [place]   1 year ago [place], [place]ki Muff, [place]   1 year ago [place], [place], [place]   1 year ago Idiopathic peripheral neuropathy   [facility], V[place]ki Muff, [place]

## 2019-07-05 NOTE — Telephone Encounter (Signed)
Patient requesting refills on Zovirax cream for cold sores.  He said he lost in the "divorce".  Please send to [facility].

## 2019-07-05 NOTE — Telephone Encounter (Signed)
Patient changed his mind.  Send medicine to [facility] [employer].

## 2019-07-05 NOTE — Telephone Encounter (Signed)
Sent Acyclovir (generic Zovirax) cream to the [facility] [employer]. Computer alerts that it may not be covered by his insurance.

## 2019-07-05 NOTE — Telephone Encounter (Signed)
I do not see this on his current med list.

## 2019-07-06 ENCOUNTER — Emergency Department: Payer: 59

## 2019-07-06 ENCOUNTER — Encounter: Payer: Self-pay | Admitting: Emergency Medicine

## 2019-07-06 ENCOUNTER — Other Ambulatory Visit: Payer: Self-pay

## 2019-07-06 ENCOUNTER — Telehealth: Payer: Self-pay | Admitting: Family Medicine

## 2019-07-06 ENCOUNTER — Emergency Department
Admission: EM | Admit: 2019-07-06 | Discharge: 2019-07-06 | Discharge status: Against Medical Advice | Payer: 59 | Attending: Emergency Medicine | Admitting: Emergency Medicine

## 2019-07-06 ENCOUNTER — Ambulatory Visit: Payer: 59 | Admitting: Psychology

## 2019-07-06 DIAGNOSIS — S065XAA Traumatic subdural hemorrhage with loss of consciousness status unknown, initial encounter: Secondary | ICD-10-CM | POA: Diagnosis present

## 2019-07-06 DIAGNOSIS — R233 Spontaneous ecchymoses: Secondary | ICD-10-CM | POA: Diagnosis not present

## 2019-07-06 DIAGNOSIS — R5383 Other fatigue: Secondary | ICD-10-CM | POA: Diagnosis present

## 2019-07-06 DIAGNOSIS — D649 Anemia, unspecified: Secondary | ICD-10-CM

## 2019-07-06 DIAGNOSIS — Z20822 Contact with and (suspected) exposure to covid-19: Secondary | ICD-10-CM | POA: Insufficient documentation

## 2019-07-06 DIAGNOSIS — D61818 Other pancytopenia: Secondary | ICD-10-CM | POA: Diagnosis not present

## 2019-07-06 DIAGNOSIS — F17200 Nicotine dependence, unspecified, uncomplicated: Secondary | ICD-10-CM | POA: Insufficient documentation

## 2019-07-06 DIAGNOSIS — I609 Nontraumatic subarachnoid hemorrhage, unspecified: Secondary | ICD-10-CM

## 2019-07-06 DIAGNOSIS — S065X9A Traumatic subdural hemorrhage with loss of consciousness of unspecified duration, initial encounter: Secondary | ICD-10-CM | POA: Diagnosis present

## 2019-07-06 LAB — URINALYSIS, COMPLETE (UACMP) WITH MICROSCOPIC
Bacteria, UA: NONE SEEN
Bilirubin Urine: NEGATIVE
Glucose, UA: NEGATIVE mg/dL
Hgb urine dipstick: NEGATIVE
Ketones, ur: NEGATIVE mg/dL
Leukocytes,Ua: NEGATIVE
Nitrite: NEGATIVE
Protein, ur: NEGATIVE mg/dL
Specific Gravity, Urine: 1.016 (ref 1.005–1.030)
WBC, UA: NONE SEEN WBC/hpf (ref 0–5)
pH: 6 (ref 5.0–8.0)

## 2019-07-06 LAB — BASIC METABOLIC PANEL
Anion gap: 7 (ref 5–15)
BUN: 22 mg/dL — ABNORMAL HIGH (ref 6–20)
CO2: 24 mmol/L (ref 22–32)
Calcium: 8.4 mg/dL — ABNORMAL LOW (ref 8.9–10.3)
Chloride: 110 mmol/L (ref 98–111)
Creatinine, Ser: 0.76 mg/dL (ref 0.61–1.24)
GFR calc Af Amer: >60 mL/min (ref >60)
GFR calc non Af Amer: >60 mL/min (ref >60)
Glucose, Bld: 115 mg/dL — ABNORMAL HIGH (ref 70–99)
Potassium: 3.8 mmol/L (ref 3.5–5.1)
Sodium: 141 mmol/L (ref 135–145)

## 2019-07-06 LAB — HEPATITIS PANEL, ACUTE
HCV Ab: NONREACTIVE
Hep A IgM: NONREACTIVE
Hep B C IgM: NONREACTIVE
Hepatitis B Surface Ag: NONREACTIVE

## 2019-07-06 LAB — PREPARE RBC (CROSSMATCH)

## 2019-07-06 LAB — CBC
HCT: 17.8 % — ABNORMAL LOW (ref 39.0–52.0)
Hemoglobin: 6.1 g/dL — ABNORMAL LOW (ref 13.0–17.0)
MCH: 39.9 pg — ABNORMAL HIGH (ref 26.0–34.0)
MCHC: 34.3 g/dL (ref 30.0–36.0)
MCV: 116.3 fL — ABNORMAL HIGH (ref 80.0–100.0)
Platelets: 16 10*3/uL — CL (ref 150–400)
RBC: 1.53 MIL/uL — ABNORMAL LOW (ref 4.22–5.81)
RDW: 15.4 % (ref 11.5–15.5)
WBC: 0.7 10*3/uL — CL (ref 4.0–10.5)
nRBC: 2.9 % — ABNORMAL HIGH (ref 0.0–0.2)

## 2019-07-06 LAB — HIV ANTIBODY (ROUTINE TESTING W REFLEX): HIV Screen 4th Generation wRfx: NONREACTIVE

## 2019-07-06 LAB — APTT: aPTT: 43 seconds — ABNORMAL HIGH (ref 24–36)

## 2019-07-06 LAB — PROTIME-INR
INR: 1.5 — ABNORMAL HIGH (ref 0.8–1.2)
Prothrombin Time: 18 seconds — ABNORMAL HIGH (ref 11.4–15.2)

## 2019-07-06 LAB — FIBRINOGEN: Fibrinogen: 282 mg/dL (ref 210–475)

## 2019-07-06 LAB — SARS CORONAVIRUS 2 (TAT 6-24 HRS): SARS Coronavirus 2: NEGATIVE

## 2019-07-06 LAB — PATHOLOGIST SMEAR REVIEW

## 2019-07-06 LAB — FOLATE: Folate: 27 ng/mL (ref >5.9)

## 2019-07-06 LAB — VITAMIN B12: Vitamin B-12: 197 pg/mL (ref 180–914)

## 2019-07-06 LAB — LACTATE DEHYDROGENASE: LDH: 199 U/L — ABNORMAL HIGH (ref 98–192)

## 2019-07-06 LAB — ABO/RH: ABO/RH(D): O POS

## 2019-07-06 LAB — RETICULOCYTES
Immature Retic Fract: 11.7 % (ref 2.3–15.9)
RBC.: 1.54 MIL/uL — ABNORMAL LOW (ref 4.22–5.81)
Retic Count, Absolute: 27.4 10*3/uL (ref 19.0–186.0)
Retic Ct Pct: 1.8 % (ref 0.4–3.1)

## 2019-07-06 LAB — FIBRIN DERIVATIVES D-DIMER (ARMC ONLY): Fibrin derivatives D-dimer (ARMC): 515.15 ng/mL (FEU) — ABNORMAL HIGH (ref 0.00–499.00)

## 2019-07-06 MED ORDER — SODIUM CHLORIDE 0.9 % IV SOLN
10.0000 mL/h | Freq: Once | INTRAVENOUS | Status: AC
  Administered 2019-07-06: 10 mL/h via INTRAVENOUS

## 2019-07-06 MED ORDER — VITAMIN B-12 1000 MCG PO TABS: 1000.0000 ug | ORAL_TABLET | Freq: Every day | ORAL | 0 refills | Status: AC

## 2019-07-06 MED ORDER — VITAMIN B-12 1000 MCG PO TABS
1000.0000 ug | ORAL_TABLET | ORAL | Status: AC
  Administered 2019-07-06: 1000 ug via ORAL
  Filled 2019-07-06: qty 1

## 2019-07-06 NOTE — Telephone Encounter (Signed)
Atttempted to contact patient by phone at about 0500 and again at 0600 and left VM each time that he should proceed to the ER to be evaluated as I was contacted as the on-call physician that his WBC was low at 0.8 and his platelet count was low at 18K.  Dr. Roxan Hockey

## 2019-07-06 NOTE — ED Notes (Signed)
E signature blood consent obtained and in chart.

## 2019-07-06 NOTE — Care Management (Signed)
This is a no charge note  I was called for admission, but per ED physician, this patient will leave hospital AMA.  Admission is canceled.   Ivor Costa, MD  [facility]   If 7PM-7AM, please contact night-coverage [email or web address] 07/06/2019, 5:24 PM

## 2019-07-06 NOTE — ED Provider Notes (Signed)
Patient remained hemodynamically stable and asymptomatic after transfusion.  He is still declining admission to the hospital and demonstrates understanding that he is signing out [place].  We have been able to arrange expedient outpatient follow-up for him.  He demonstrates understanding of the risks associated with delayed treatment and work-up but agrees to come back to the hospital if he changes his mind or develops worsening symptoms.   Merlyn Lot, MD 07/06/19 1739

## 2019-07-06 NOTE — Consult Note (Signed)
Hematology/Oncology Consult note [facility] Telephone:([phone] [phone]  Patient Care Team: Chrismon, Vickki Muff, PA as PCP - General (Family Medicine)   Name of the patient: Cameron Bennett  [id number]  [birth year]    Reason for consult: Pancytopenia   Requesting physician: Dr. Joni Fears  Date of visit: 07/06/2019    History of presenting illness-patient is a 59 year old male who was sent to the ER following a critically low white cell count and platelet count found on routine CBC at PCPs office.  Patient currently reports feeling extremely fatigued. CBC done today in the ER shows a white cell count of 0.7, H&H of 6.1/17.8 with an MCV of 116 and a platelet count of 16.  Differential shows increased number of nucleated RBCs.  His prior CBCs before this is from 2018 when his white cell count was 8.5 H&H 17.4/51.8 and a platelet count of 197.  We do not have any other CBCs between 20 18-20 21.  No recent history of infections.  Patient states he was given antibiotics 3 weeks prior for tooth extraction.  Otherwise he has been feeling fine and other than fatigue denies other complaints.  States he is active and independent of his ADLs and IADLs.  Appetite and weight have been stable.  Denies any recurrent infections.  ECOG PS- 1  Pain scale- 0   Review of systems- Review of Systems  Constitutional: Positive for malaise/fatigue. Negative for chills, fever and weight loss.  HENT: Negative for congestion, ear discharge and nosebleeds.   Eyes: Negative for blurred vision.  Respiratory: Negative for cough, hemoptysis, sputum production, shortness of breath and wheezing.   Cardiovascular: Negative for chest pain, palpitations, orthopnea and claudication.  Gastrointestinal: Negative for abdominal pain, blood in stool, constipation, diarrhea, heartburn, melena, nausea and vomiting.  Genitourinary: Negative for dysuria, flank pain, frequency, hematuria and  urgency.  Musculoskeletal: Negative for back pain, joint pain and myalgias.  Skin: Negative for rash.  Neurological: Negative for dizziness, tingling, focal weakness, seizures, weakness and headaches.  Endo/Heme/Allergies: Does not bruise/bleed easily.  Psychiatric/Behavioral: Negative for depression and suicidal ideas. The patient does not have insomnia.     Allergies  Allergen Reactions  . Penicillins Swelling  . Sulfa Antibiotics Rash    Other reaction(s): Unknown    Patient Active Problem List   Diagnosis Date Noted  . History of cold sores 07/05/2019  . Depression, recurrent ([place]) 06/18/2018  . Back pain, chronic 11/02/2014  . Dysfunction of eustachian tube 11/02/2014  . H/O drug abuse ([place]) 11/02/2014  . History of surgery to major organs, presenting hazards to health 11/02/2014  . Personal history of urinary calculi 11/02/2014  . 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 Adiposity 11/02/2014  . Dyssomnia 11/02/2014  . 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 Anxiety, generalized 11/08/2005     History reviewed. No pertinent past medical history.   Past Surgical History:  Procedure Laterality Date  . ELBOW SURGERY Right 2008  . HEMORROIDECTOMY    . HERNIA REPAIR    . LUMBAR LAMINECTOMY  2012    Social History   Socioeconomic History  . Marital status: Divorced    Spouse name: Not on file  . Number of children: Not on file  . Years of education: Not on file  . Highest education level: Not on file  Occupational History  . Not on file  Tobacco Use  . Smoking status: Current Every Day Smoker  . Smokeless tobacco: Never Used  Substance and Sexual Activity  .  Alcohol use: No    Alcohol/week: 0.0 standard drinks  . Drug use: No    Comment: quit drug use 06/21/2010  . Sexual activity: Not on file  Other Topics Concern  . Not on file  Social History Narrative  . Not on file   Social Determinants of Health   Financial Resource Strain:   . Difficulty of Paying Living  Expenses: Not on file  Food Insecurity:   . Worried About Charity fundraiser in the Last Year: Not on file  . Ran Out of Food in the Last Year: Not on file  Transportation Needs:   . Lack of Transportation (Medical): Not on file  . Lack of Transportation (Non-Medical): Not on file  Physical Activity:   . Days of Exercise per Week: Not on file  . Minutes of Exercise per Session: Not on file  Stress:   . Feeling of Stress : Not on file  Social Connections:   . Frequency of Communication with Friends and Family: Not on file  . Frequency of Social Gatherings with Friends and Family: Not on file  . Attends Religious Services: Not on file  . Active Member of Clubs or Organizations: Not on file  . Attends Archivist Meetings: Not on file  . Marital Status: Not on file  Intimate Partner Violence:   . Fear of Current or Ex-Partner: Not on file  . Emotionally Abused: Not on file  . Physically Abused: Not on file  . Sexually Abused: Not on file     Family History  Problem Relation Age of Onset  . COPD Mother   . Lung cancer Father   . Diabetes Sister   . Diabetes Brother      Current Facility-Administered Medications:  .  0.9 %  sodium chloride infusion, 10 mL/hr, Intravenous, Once, Carrie Mew, MD  Current Outpatient Medications:  .  acyclovir cream (ZOVIRAX) 5 %, Apply 1 application topically every 3 (three) hours. Maximum of 5 times a day., Disp: 15 g, Rfl: 0 .  albuterol (PROVENTIL HFA;VENTOLIN HFA) 108 (90 Base) MCG/ACT inhaler, Inhale 2 puffs into the lungs every 6 (six) hours as needed for wheezing or shortness of breath., Disp: 1 Inhaler, Rfl: 2 .  Aspirin-Acetaminophen-Caffeine (EXCEDRIN PO), Take 1 tablet by mouth every 6 (six) hours as needed. For pain, Disp: , Rfl:  .  b complex vitamins tablet, Take 1 tablet by mouth daily., Disp: , Rfl:  .  Multiple Vitamin (MULTI-VITAMINS) TABS, Take by mouth., Disp: , Rfl:  .  pregabalin (LYRICA) 75 MG capsule, Take 1  capsule (75 mg total) by mouth 2 (two) times daily., Disp: 60 capsule, Rfl: 1   Physical exam:  Vitals:   07/06/19 0746 07/06/19 0748 07/06/19 0845 07/06/19 0900  BP: (!) 154/67   (!) 122/93  Pulse: 97  86   Resp: 18  17 18   Temp: 98.3 F (36.8 C)     TempSrc: Oral     SpO2: 100%  100%   Weight:  195 lb (88.5 kg)    Height:  5' 8"  (1.727 m)     Physical Exam HENT:     Head: Normocephalic and atraumatic.  Eyes:     Pupils: Pupils are equal, round, and reactive to light.  Cardiovascular:     Rate and Rhythm: Normal rate and regular rhythm.     Heart sounds: Normal heart sounds.  Pulmonary:     Effort: Pulmonary effort is normal.  Breath sounds: Normal breath sounds.  Abdominal:     General: Bowel sounds are normal.     Palpations: Abdomen is soft.     Comments: No palpable hepatosplenomegaly  Musculoskeletal:     Cervical back: Normal range of motion.  Lymphadenopathy:     Comments: No palpable cervical, supraclavicular, axillary or inguinal adenopathy   Skin:    General: Skin is warm and dry.  Neurological:     Mental Status: He is alert and oriented to person, place, and time.        CMP Latest Ref Rng & Units 07/06/2019  Glucose 70 - 99 mg/dL 115(H)  BUN 6 - 20 mg/dL 22(H)  Creatinine 0.61 - 1.24 mg/dL 0.76  Sodium 135 - 145 mmol/L 141  Potassium 3.5 - 5.1 mmol/L 3.8  Chloride 98 - 111 mmol/L 110  CO2 22 - 32 mmol/L 24  Calcium 8.9 - 10.3 mg/dL 8.4(L)  Total Protein 6.0 - 8.5 g/dL -  Total Bilirubin 0.0 - 1.2 mg/dL -  Alkaline Phos 39 - 117 IU/L -  AST 0 - 40 IU/L -  ALT 0 - 44 IU/L -   CBC Latest Ref Rng & Units 07/06/2019  WBC 4.0 - 10.5 K/uL 0.7(LL)  Hemoglobin 13.0 - 17.0 g/dL 6.1(L)  Hematocrit 39.0 - 52.0 % 17.8(L)  Platelets 150 - 400 K/uL 16(LL)    @IMAGES @  DG Chest Portable 1 View  Result Date: 07/06/2019 CLINICAL DATA:  Dyspnea EXAM: PORTABLE CHEST 1 VIEW COMPARISON:  10/11/2014 FINDINGS: Cardiac shadow is stable. Lungs are  hyperinflated without focal infiltrate or sizable effusion. No bony abnormality is noted. IMPRESSION: COPD without acute abnormality. Electronically Signed   By: Inez Catalina M.D.   On: 07/06/2019 09:05    Assessment and plan- Patient is a 59 y.o. male admitted for severe pancytopenia  Patient's prior CBC from 2018 was relatively normal.  We do not have any interim counts.  Today he has severe pancytopenia with a white cell count of 0.7, H&H of 6.1/17 and a platelet count of 16 along with significant macrocytosis which is overall concerning for an underlying bone marrow process such as acute leukemia.  Smear review is currently in process and I have ordered HIV hepatitis panel B12 folate.  Peripheral flow cytometry has also been ordered and is currently pending.  LDH mildly elevated at 199.  Renal functions and potassium are normal.  I recommend a bone marrow biopsy on a relatively urgent basis given his significant pancytopenia.  I did discuss this with Dr. Kathlene Cote and he will try to get him in for an inpatient bone marrow biopsy tomorrow.  However patient is adamant that he does not want to stay in the hospital and does not want to consider a bone marrow biopsy yet.  He understands that he has severe pancytopenia and with his low platelet counts he is also at risk for bleeding.  He does need a bone marrow biopsy regardless of his peripheral blood work-up for an expedited diagnosis and further treatment.  Patient would like to wait for his peripheral blood work-up to come back before talking about a bone marrow.  He wants to go home and discuss blood test results over a video visit which I will arrange for with the end of the week.  Patient understands the risks of undiagnosed severe pancytopenia including bleeding and recurrent infections.  I have also recommended that patient should go to [place] or [facility] ER if he were to  develop significant bleeding or symptoms that would want him to come back to  the ER.  Pretest possibility of acute leukemia is high and it is not treated here at [facility] and he would benefit from going to a tertiary center for the same.  Okay to transfuse patient with 1 unit of PRBC irradiated products.  It is okay to hold off on platelet transfusions if he is not bleeding.  Platelet transfusion would be indicated if platelet count is less than 10.    Visit Diagnosis Severe pancytopenia  Dr. Randa Evens, MD, MPH [facility] at [facility] [phone] 07/06/2019

## 2019-07-06 NOTE — ED Notes (Signed)
Sprite and sandwich tray given as pt is hungry, V.O from Dr. Joni Fears that pt may eat.

## 2019-07-06 NOTE — ED Notes (Signed)
Report at bedside to [facility], [place]

## 2019-07-06 NOTE — Telephone Encounter (Signed)
Extremely low platelets and WBC count. Should proceed to ER as recommended by Dr Roxan Hockey. Remainder of blood cell counts are not available yet, but, may be very low also.

## 2019-07-06 NOTE — Telephone Encounter (Signed)
Cameron Bennett with [employer] calling to report pt's Neutrophils absolute value at  0.2.    Pt labs resulted yesterday reported as critical as well.   Transfused in ED today, left AMA this evening.   Per appt notes, pt at [facility] presently.  Unable to reach Dr. Gorden Harms, on call provider, with critical neutrophil value.

## 2019-07-06 NOTE — ED Provider Notes (Signed)
[facility] Emergency Department Provider Note  ____________________________________________  Time seen: Approximately 11:03 AM  I have reviewed the triage vital signs and the nursing notes.   HISTORY  Chief Complaint Abnormal Lab    HPI Cameron Bennett is a 59 y.o. male with a history of drug abuse, intracranial hemorrhage, pancytopenia who comes to the ED complaining of severe fatigue, worsening for the past 3 weeks.  Also notes that 6 weeks ago when he had a dental procedure he had prolonged bleeding.  Also noticed easy bruising for the past few weeks.  Denies any significant head trauma or falls.  No chest pain or shortness of breath.  No abdominal pain, no black or bloody stool or hematemesis.  Patient reports that he had COVID-19 in November.      History reviewed. No pertinent past medical history.   Patient Active Problem List   Diagnosis Date Noted  . Pancytopenia ([place]) 07/06/2019  . SAH (subarachnoid hemorrhage) ([place]) 07/06/2019  . SDH (subdural hematoma) ([place]) 07/06/2019  . Symptomatic anemia   . History of cold sores 07/05/2019  . Depression, recurrent ([place]) 06/18/2018  . Back pain, chronic 11/02/2014  . Dysfunction of eustachian tube 11/02/2014  . H/O drug abuse ([place]) 11/02/2014  . History of surgery to major organs, presenting hazards to health 11/02/2014  . Personal history of urinary calculi 11/02/2014  . 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 Adiposity 11/02/2014  . Dyssomnia 11/02/2014  . 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 Anxiety, generalized 11/08/2005     Past Surgical History:  Procedure Laterality Date  . ELBOW SURGERY Right 2008  . HEMORROIDECTOMY    . HERNIA REPAIR    . LUMBAR LAMINECTOMY  2012     Prior to Admission medications   Medication Sig Start Date End Date Taking? Authorizing Provider  albuterol (PROVENTIL HFA;VENTOLIN HFA) 108 (90 Base) MCG/ACT inhaler Inhale 2 puffs into the lungs every 6 (six) hours as  needed for wheezing or shortness of breath. 06/09/18  Yes Carles Collet M, PA-C  b complex vitamins tablet Take 1 tablet by mouth daily.   Yes [provider]  pregabalin (LYRICA) 75 MG capsule Take 1 capsule (75 mg total) by mouth 2 (two) times daily. 06/11/19  Yes Bacigalupo, Dionne Bucy, MD  Aspirin-Acetaminophen-Caffeine (EXCEDRIN PO) Take 1 tablet by mouth every 6 (six) hours as needed. For pain    [provider]  vitamin B-12 (CYANOCOBALAMIN) 1000 MCG tablet Take 1 tablet (1,000 mcg total) by mouth daily. 07/06/19   Merlyn Lot, MD     Allergies Penicillins and Sulfa antibiotics   Family History  Problem Relation Age of Onset  . COPD Mother   . Lung cancer Father   . Diabetes Sister   . Diabetes Brother     Social History Social History   Tobacco Use  . Smoking status: Current Every Day Smoker  . Smokeless tobacco: Never Used  Substance Use Topics  . Alcohol use: No    Alcohol/week: 0.0 standard drinks  . Drug use: No    Comment: quit drug use 06/21/2010    Review of Systems  Constitutional:   No fever or chills.  ENT:   No sore throat. No rhinorrhea. Cardiovascular:   No chest pain or syncope. Respiratory:   No dyspnea or cough. Gastrointestinal:   Negative for abdominal pain, vomiting and diarrhea.  Musculoskeletal:   Negative for focal pain or swelling All other systems reviewed and are negative except as documented above in ROS  and HPI.  ____________________________________________   PHYSICAL EXAM:  VITAL SIGNS: ED Triage Vitals  Enc Vitals Group     BP 07/06/19 0746 (!) 154/67     Pulse Rate 07/06/19 0746 97     Resp 07/06/19 0746 18     Temp 07/06/19 0746 98.3 F (36.8 C)     Temp Source 07/06/19 0746 Oral     SpO2 07/06/19 0746 100 %     Weight 07/06/19 0748 195 lb (88.5 kg)     Height 07/06/19 0748 '5\' 8"'$  (1.727 m)     Head Circumference --      Peak Flow --      Pain Score 07/06/19 0746 1     Pain Loc --      Pain Edu?  --      Excl. in [place]? --     Vital signs reviewed, nursing assessments reviewed.   Constitutional:   Alert and oriented. Non-toxic appearance. Eyes:   Conjunctivae are normal. EOMI. PERRL. ENT      Head:   Normocephalic and atraumatic.      Nose:   Wearing a mask.      Mouth/Throat:   Wearing a mask.      Neck:   No meningismus. Full ROM. Hematological/Lymphatic/Immunilogical:   No cervical lymphadenopathy. Cardiovascular:   RRR. Symmetric bilateral radial and DP pulses.  No murmurs. Cap refill less than 2 seconds. Respiratory:   Normal respiratory effort without tachypnea/retractions. Breath sounds are clear and equal bilaterally. No wheezes/rales/rhonchi. Gastrointestinal:   Soft and nontender. Non distended. There is no CVA tenderness.  No rebound, rigidity, or guarding. Musculoskeletal:   Normal range of motion in all extremities. No joint effusions.  No lower extremity tenderness.  No edema. Neurologic:   Normal speech and language.  Motor grossly intact. No acute focal neurologic deficits are appreciated.  Skin:    Skin is warm, dry and intact. No petechial rash noted.  No  purpura or bullae.  No large bruises or hematomas.  ____________________________________________    LABS (pertinent positives/negatives) (all labs ordered are listed, but only abnormal results are displayed) Labs Reviewed  BASIC METABOLIC PANEL - Abnormal; Notable for the following components:      Result Value   Glucose, Bld 115 (*)    BUN 22 (*)    Calcium 8.4 (*)    All other components within normal limits  CBC - Abnormal; Notable for the following components:   WBC 0.7 (*)    RBC 1.53 (*)    Hemoglobin 6.1 (*)    HCT 17.8 (*)    MCV 116.3 (*)    MCH 39.9 (*)    Platelets 16 (*)    nRBC 2.9 (*)    All other components within normal limits  URINALYSIS, COMPLETE (UACMP) WITH MICROSCOPIC - Abnormal; Notable for the following components:   Color, Urine YELLOW (*)    APPearance HAZY (*)    All  other components within normal limits  LACTATE DEHYDROGENASE - Abnormal; Notable for the following components:   LDH 199 (*)    All other components within normal limits  PROTIME-INR - Abnormal; Notable for the following components:   Prothrombin Time 18.0 (*)    INR 1.5 (*)    All other components within normal limits  FIBRIN DERIVATIVES D-DIMER ([facility] ONLY) - Abnormal; Notable for the following components:   Fibrin derivatives D-dimer ([facility]) 515.15 (*)    All other components within normal limits  APTT - Abnormal;  Notable for the following components:   aPTT 43 (*)    All other components within normal limits  RETICULOCYTES - Abnormal; Notable for the following components:   RBC. 1.54 (*)    All other components within normal limits  SARS CORONAVIRUS 2 (TAT 6-24 HRS)  FIBRINOGEN  VITAMIN B12  FOLATE  PATHOLOGIST SMEAR REVIEW  HIV ANTIBODY (ROUTINE TESTING W REFLEX)  HEPATITIS PANEL, ACUTE  COMP PANEL: LEUKEMIA/LYMPHOMA  TYPE AND SCREEN  PREPARE RBC (CROSSMATCH)  ABO/RH   ____________________________________________   EKG  Interpreted by me  Date: 07/06/2019  Rate: 97  Rhythm: normal sinus rhythm  QRS Axis: normal  Intervals: normal  ST/T Wave abnormalities: normal  Conduction Disutrbances: none  Narrative Interpretation: unremarkable      ____________________________________________    RADIOLOGY  No results found.  ____________________________________________   PROCEDURES .Critical Care Performed by: Carrie Mew, MD Authorized by: Carrie Mew, MD   Critical care provider statement:    Critical care time (minutes):  35   Critical care time was exclusive of:  Separately billable procedures and treating other patients   Critical care was necessary to treat or prevent imminent or life-threatening deterioration of the following conditions:  Circulatory failure and shock   Critical care was time spent personally by me on the following  activities:  Development of treatment plan with patient or surrogate, discussions with consultants, evaluation of patient's response to treatment, examination of patient, obtaining history from patient or surrogate, ordering and performing treatments and interventions, ordering and review of laboratory studies, ordering and review of radiographic studies, pulse oximetry, re-evaluation of patient's condition and review of old charts    ____________________________________________    CLINICAL IMPRESSION / ASSESSMENT AND PLAN / ED COURSE  Medications ordered in the ED: Medications  0.9 %  sodium chloride infusion (0 mL/hr Intravenous Stopped 07/06/19 1503)  vitamin B-12 (CYANOCOBALAMIN) tablet 1,000 mcg (1,000 mcg Oral Given 07/06/19 1635)    Pertinent labs & imaging results that were available during my care of the patient were reviewed by me and considered in my medical decision making (see chart for details).  Cameron Bennett was evaluated in Emergency Department on 07/10/2019 for the symptoms described in the history of present illness. He was evaluated in the context of the global COVID-19 pandemic, which necessitated consideration that the patient might be at risk for infection with the SARS-CoV-2 virus that causes COVID-19. Institutional protocols and algorithms that pertain to the evaluation of patients at risk for COVID-19 are in a state of rapid change based on information released by regulatory bodies including the CDC and federal and state organizations. These policies and algorithms were followed during the patient's care in the ED.   Patient presents with fatigue, easy bruising.  Outpatient labs yesterday do show white blood cell count of 0.8 and a platelet count of 18.  We will repeat labs today including DIC panel.   ----------------------------------------- 11:06 AM on 07/06/2019 -----------------------------------------  Hemoglobin is 6.1, white blood cell and platelets low.   No active bleeding Case discussed with hematology Dr. Janese Banks who agrees with irradiated red cell transfusion.  No platelets for now given platelets are greater than 10 and no bleeding.  With plan for admission, bone marrow biopsy tomorrow, n.p.o. after midnight.  Patient did express some hesitation about being hospitalized due to the COVID-19 pandemic but encouraged him to stay given the potentially serious underlying diagnosis such as leukemia that may be causing the pancytopenia and need for expedited work-up.  Clinical Course as of Jul 09 1498  Tue Jul 06, 2019  0900 Calcium(!): 8.4 [[place]]  1034 Pathologist smear review [[place]]    Clinical Course User Index [] Manson Allan, Student-PA      ____________________________________________   FINAL CLINICAL IMPRESSION(S) / ED DIAGNOSES    Final diagnoses:  Pancytopenia ([place])  Symptomatic anemia     ED Discharge Orders         Ordered    vitamin B-12 (CYANOCOBALAMIN) 1000 MCG tablet  Daily     07/06/19 1613          Portions of this note were generated with dragon dictation software. Dictation errors may occur despite best attempts at proofreading.   Carrie Mew, MD 07/10/19 1500

## 2019-07-06 NOTE — ED Triage Notes (Signed)
Patient presents to the ED after PCP called patient to come to the ED for critical low WBC and critical low platelets.  Patient appears pale.  Patient states he hasn't felt well for the past 2 weeks, noticing an accelerated heart rate and feeling very fatigued.

## 2019-07-06 NOTE — Discharge Instructions (Addendum)
Your lab test today show low white blood cell count, red blood cell count, and platelet count.  You will need a bone marrow biopsy to determine the cause of this.  It is important to have this test done to ensure that it is not leukemia.  Return to the emergency room at any time if you change your mind and wish to proceed with evaluation and hospital treatment.  Otherwise, please follow-up as soon as possible in hematology clinic to have outpatient bone marrow biopsy arranged.    Please take B12 Vitamin 1000Mcg daily per Dr. Janese Banks

## 2019-07-07 LAB — TYPE AND SCREEN
ABO/RH(D): O POS
Antibody Screen: NEGATIVE
Unit division: 0
Unit division: 0
Unit division: 0
Unit division: 0

## 2019-07-07 LAB — BPAM RBC
Blood Product Expiration Date: 202102242359
Blood Product Expiration Date: 202102272359
Blood Product Expiration Date: 202103162359
Blood Product Expiration Date: 202103162359
ISSUE DATE / TIME: 202102161048
ISSUE DATE / TIME: 202102161329
Unit Type and Rh: 5100
Unit Type and Rh: 5100
Unit Type and Rh: 5100
Unit Type and Rh: 5100

## 2019-07-07 LAB — COMPREHENSIVE METABOLIC PANEL
ALT: 11 IU/L (ref 0–44)
AST: 16 IU/L (ref 0–40)
Albumin/Globulin Ratio: 2.4 — ABNORMAL HIGH (ref 1.2–2.2)
Albumin: 3.8 g/dL (ref 3.8–4.9)
Alkaline Phosphatase: 88 IU/L (ref 39–117)
BUN/Creatinine Ratio: 21 — ABNORMAL HIGH (ref 9–20)
BUN: 17 mg/dL (ref 6–24)
Bilirubin Total: 0.5 mg/dL (ref 0.0–1.2)
CO2: 21 mmol/L (ref 20–29)
Calcium: 8.3 mg/dL — ABNORMAL LOW (ref 8.7–10.2)
Chloride: 107 mmol/L — ABNORMAL HIGH (ref 96–106)
Creatinine, Ser: 0.8 mg/dL (ref 0.76–1.27)
GFR calc Af Amer: 115 mL/min/{1.73_m2} (ref >59)
GFR calc non Af Amer: 100 mL/min/{1.73_m2} (ref >59)
Globulin, Total: 1.6 g/dL (ref 1.5–4.5)
Glucose: 109 mg/dL — ABNORMAL HIGH (ref 65–99)
Potassium: 3.8 mmol/L (ref 3.5–5.2)
Sodium: 140 mmol/L (ref 134–144)
Total Protein: 5.4 g/dL — ABNORMAL LOW (ref 6.0–8.5)

## 2019-07-07 LAB — CBC WITH DIFFERENTIAL/PLATELET
Basophils Absolute: 0 10*3/uL (ref 0.0–0.2)
Basos: 0 %
EOS (ABSOLUTE): 0 10*3/uL (ref 0.0–0.4)
Eos: 0 %
Hematocrit: 18.3 % — ABNORMAL LOW (ref 37.5–51.0)
Hemoglobin: 6.5 g/dL — CL (ref 13.0–17.7)
Lymphocytes Absolute: 0.6 10*3/uL — ABNORMAL LOW (ref 0.7–3.1)
Lymphs: 65 %
MCH: 40.1 pg — ABNORMAL HIGH (ref 26.6–33.0)
MCHC: 35.5 g/dL (ref 31.5–35.7)
MCV: 113 fL — ABNORMAL HIGH (ref 79–97)
Monocytes Absolute: 0 10*3/uL — ABNORMAL LOW (ref 0.1–0.9)
Monocytes: 0 %
NRBC: 8 % — ABNORMAL HIGH (ref 0–0)
Neutrophils Absolute: 0.2 10*3/uL — CL (ref 1.4–7.0)
Neutrophils: 26 %
Platelets: 18 10*3/uL — CL (ref 150–450)
RBC: 1.62 x10E6/uL — CL (ref 4.14–5.80)
RDW: 15.3 % (ref 11.6–15.4)
WBC: 0.8 10*3/uL — CL (ref 3.4–10.8)

## 2019-07-07 LAB — LIPID PANEL
Chol/HDL Ratio: 2.5 ratio (ref 0.0–5.0)
Cholesterol, Total: 106 mg/dL (ref 100–199)
HDL: 43 mg/dL (ref >39)
LDL Chol Calc (NIH): 50 mg/dL (ref 0–99)
Triglycerides: 56 mg/dL (ref 0–149)
VLDL Cholesterol Cal: 13 mg/dL (ref 5–40)

## 2019-07-07 LAB — IMMATURE CELLS: Blasts/blast like cells: 9 % — ABNORMAL HIGH (ref 0–0)

## 2019-07-07 LAB — TSH: TSH: 3.69 u[IU]/mL (ref 0.450–4.500)

## 2019-07-08 ENCOUNTER — Inpatient Hospital Stay: Payer: 59 | Attending: Oncology | Admitting: Oncology

## 2019-07-08 ENCOUNTER — Ambulatory Visit
Admit: 2019-07-08 | Discharge: 2019-08-06 | Disposition: A | Discharge status: Home / Self Care | Payer: BLUE CROSS/BLUE SHIELD | Admitting: Medical Oncology

## 2019-07-08 ENCOUNTER — Other Ambulatory Visit: Payer: Self-pay

## 2019-07-08 DIAGNOSIS — C9200 Acute myeloblastic leukemia, not having achieved remission: Secondary | ICD-10-CM

## 2019-07-08 LAB — COMP PANEL: LEUKEMIA/LYMPHOMA: Immunophenotypic Profile: 25

## 2019-07-08 NOTE — Telephone Encounter (Signed)
If able to contact patient, must follow up with hematologist/oncologist for bone marrow biopsy.

## 2019-07-09 ENCOUNTER — Inpatient Hospital Stay: Payer: 59 | Admitting: Oncology

## 2019-07-09 ENCOUNTER — Inpatient Hospital Stay: Payer: 59

## 2019-07-09 MED ORDER — SODIUM CHLORIDE 0.9 % IV SOLN
20.00 | INTRAVENOUS | Status: DC
Start: ? — End: 2019-07-09

## 2019-07-09 MED ORDER — ALLOPURINOL 300 MG PO TABS
300.00 | ORAL_TABLET | ORAL | Status: DC
Start: 2019-07-18 — End: 2019-07-09

## 2019-07-09 MED ORDER — LOPERAMIDE HCL 2 MG PO CAPS
4.00 | ORAL_CAPSULE | ORAL | Status: DC
Start: ? — End: 2019-07-09

## 2019-07-09 MED ORDER — CYANOCOBALAMIN 1000 MCG PO TABS
1000.00 | ORAL_TABLET | ORAL | Status: DC
Start: 2019-08-07 — End: 2019-07-09

## 2019-07-09 MED ORDER — LOPERAMIDE HCL 2 MG PO CAPS
2.00 | ORAL_CAPSULE | ORAL | Status: DC
Start: ? — End: 2019-07-09

## 2019-07-09 MED ORDER — PREGABALIN 75 MG PO CAPS
75.00 | ORAL_CAPSULE | ORAL | Status: DC
Start: 2019-08-06 — End: 2019-07-09

## 2019-07-09 MED ORDER — POTASSIUM CHLORIDE CRYS ER 10 MEQ PO TBCR
20.00 | EXTENDED_RELEASE_TABLET | ORAL | Status: DC
Start: ? — End: 2019-07-09

## 2019-07-09 MED ORDER — POTASSIUM CHLORIDE CRYS ER 10 MEQ PO TBCR
40.00 | EXTENDED_RELEASE_TABLET | ORAL | Status: DC
Start: ? — End: 2019-07-09

## 2019-07-09 MED ORDER — POTASSIUM CHLORIDE CRYS ER 20 MEQ PO TBCR
60.00 | EXTENDED_RELEASE_TABLET | ORAL | Status: DC
Start: ? — End: 2019-07-09

## 2019-07-09 NOTE — Telephone Encounter (Signed)
Pt called stating that he is being admitted for leukemia diagnosis and that he would be in contact with PCP as he could. Please advise.

## 2019-07-09 NOTE — Telephone Encounter (Signed)
LMOVM for pt to return call. Okay for [facility] triage to give patient message.

## 2019-07-10 MED ORDER — NICOTINE POLACRILEX 4 MG MT GUM
4.00 | CHEWING_GUM | OROMUCOSAL | Status: DC
Start: ? — End: 2019-07-10

## 2019-07-10 MED ORDER — NICOTINE 21 MG/24HR TD PT24
1.00 | MEDICATED_PATCH | TRANSDERMAL | Status: DC
Start: 2019-08-07 — End: 2019-07-10

## 2019-07-10 NOTE — Progress Notes (Signed)
I connected with Cameron Bennett on 07/10/19 at  1:30 PM EST by video enabled telemedicine visit and verified that I am speaking with the correct person using two identifiers.   I discussed the limitations, risks, security and privacy concerns of performing an evaluation and management service by telemedicine and the availability of in-person appointments. I also discussed with the patient that there may be a patient responsible charge related to this service. The patient expressed understanding and agreed to proceed.  Other persons participating in the visit and their role in the encounter:  none  Patient's location:  home Provider's location:  Patients fiance  Chief Complaint:  Discuss bloodwork results and further management  History of present illness: patient is a 59 year old male who was sent to the ER following a critically low white cell count and platelet count found on routine CBC at PCPs office.  Patient currently reports feeling extremely fatigued. CBC done today in the ER shows a white cell count of 0.7, H&H of 6.1/17.8 with an MCV of 116 and a platelet count of 16.  Differential shows increased number of nucleated RBCs.  His prior CBCs before this is from 2018 when his white cell count was 8.5 H&H 17.4/51.8 and a platelet count of 197.  We do not have any other CBCs between 20 18-20 21.  No recent history of infections.  Patient states he was given antibiotics 3 weeks prior for tooth extraction.  Otherwise he has been feeling fine and other than fatigue denies other complaints.  States he is active and independent of his ADLs and IADLs.    Peripheral flow cytometry showed 25% circulating blasts suggestive of AML No monoclonal B cell population is detected.  kappa:lambda ratio 0.9  There is no loss of, or aberrant expression of, the pan T cell  antigens to  suggest a neoplastic T cell process.  CD4:CD8 ratio 2.4  Circulating myeloblasts are detected with expression of CD45 (dim),   CD117,  HLADR, CD13, CD33, CD38, CD4 (dim) and CD11c (dim); negative for CD34,  representing 25% of leukocytes.  There is no immunophenotypic evidence of abnormal myeloid maturation.  Analysis of the leukocyte population shows: granulocytes 35%,  monocytes  <1%, lymphocytes 40%, blasts 25%, B cells 4%, T cells 33%, NK cells  3%.   Interval history reports fatigue. He has noticed more bruises on his hands   Review of Systems  Constitutional: Positive for malaise/fatigue. Negative for chills, fever and weight loss.  HENT: Negative for congestion, ear discharge and nosebleeds.   Eyes: Negative for blurred vision.  Respiratory: Negative for cough, hemoptysis, sputum production, shortness of breath and wheezing.   Cardiovascular: Negative for chest pain, palpitations, orthopnea and claudication.  Gastrointestinal: Negative for abdominal pain, blood in stool, constipation, diarrhea, heartburn, melena, nausea and vomiting.  Genitourinary: Negative for dysuria, flank pain, frequency, hematuria and urgency.  Musculoskeletal: Negative for back pain, joint pain and myalgias.  Skin: Negative for rash.  Neurological: Negative for dizziness, tingling, focal weakness, seizures, weakness and headaches.  Endo/Heme/Allergies: Bruises/bleeds easily.  Psychiatric/Behavioral: Negative for depression and suicidal ideas. The patient does not have insomnia.     Allergies  Allergen Reactions  . Penicillins Swelling    Did it involve swelling of the face/tongue/throat, SOB, or low BP? No Did it involve sudden or severe rash/hives, skin peeling, or any reaction on the inside of your mouth or nose? No Did you need to seek medical attention at a hospital or doctor's office? No When did  it last happen?pre teens If all above answers are "NO", may proceed with cephalosporin use.    . Sulfa Antibiotics Rash    Other reaction(s): Unknown    No past medical history on file.  Past Surgical History:   Procedure Laterality Date  . ELBOW SURGERY Right 2008  . HEMORROIDECTOMY    . HERNIA REPAIR    . LUMBAR LAMINECTOMY  2012    Social History   Socioeconomic History  . Marital status: Divorced    Spouse name: Not on file  . Number of children: Not on file  . Years of education: Not on file  . Highest education level: Not on file  Occupational History  . Not on file  Tobacco Use  . Smoking status: Current Every Day Smoker  . Smokeless tobacco: Never Used  Substance and Sexual Activity  . Alcohol use: No    Alcohol/week: 0.0 standard drinks  . Drug use: No    Comment: quit drug use 06/21/2010  . Sexual activity: Not on file  Other Topics Concern  . Not on file  Social History Narrative  . Not on file   Social Determinants of Health   Financial Resource Strain:   . Difficulty of Paying Living Expenses: Not on file  Food Insecurity:   . Worried About Charity fundraiser in the Last Year: Not on file  . Ran Out of Food in the Last Year: Not on file  Transportation Needs:   . Lack of Transportation (Medical): Not on file  . Lack of Transportation (Non-Medical): Not on file  Physical Activity:   . Days of Exercise per Week: Not on file  . Minutes of Exercise per Session: Not on file  Stress:   . Feeling of Stress : Not on file  Social Connections:   . Frequency of Communication with Friends and Family: Not on file  . Frequency of Social Gatherings with Friends and Family: Not on file  . Attends Religious Services: Not on file  . Active Member of Clubs or Organizations: Not on file  . Attends Archivist Meetings: Not on file  . Marital Status: Not on file  Intimate Partner Violence:   . Fear of Current or Ex-Partner: Not on file  . Emotionally Abused: Not on file  . Physically Abused: Not on file  . Sexually Abused: Not on file    Family History  Problem Relation Age of Onset  . COPD Mother   . Lung cancer Father   . Diabetes Sister   . Diabetes  Brother      Current Outpatient Medications:  .  albuterol (PROVENTIL HFA;VENTOLIN HFA) 108 (90 Base) MCG/ACT inhaler, Inhale 2 puffs into the lungs every 6 (six) hours as needed for wheezing or shortness of breath., Disp: 1 Inhaler, Rfl: 2 .  Aspirin-Acetaminophen-Caffeine (EXCEDRIN PO), Take 1 tablet by mouth every 6 (six) hours as needed. For pain, Disp: , Rfl:  .  b complex vitamins tablet, Take 1 tablet by mouth daily., Disp: , Rfl:  .  pregabalin (LYRICA) 75 MG capsule, Take 1 capsule (75 mg total) by mouth 2 (two) times daily., Disp: 60 capsule, Rfl: 1 .  vitamin B-12 (CYANOCOBALAMIN) 1000 MCG tablet, Take 1 tablet (1,000 mcg total) by mouth daily., Disp: 30 tablet, Rfl: 0  DG Chest Portable 1 View  Result Date: 07/06/2019 CLINICAL DATA:  Dyspnea EXAM: PORTABLE CHEST 1 VIEW COMPARISON:  10/11/2014 FINDINGS: Cardiac shadow is stable. Lungs are hyperinflated without focal infiltrate  or sizable effusion. No bony abnormality is noted. IMPRESSION: COPD without acute abnormality. Electronically Signed   By: Inez Catalina M.D.   On: 07/06/2019 09:05    No images are attached to the encounter.   CMP Latest Ref Rng & Units 07/06/2019  Glucose 70 - 99 mg/dL 115(H)  BUN 6 - 20 mg/dL 22(H)  Creatinine 0.61 - 1.24 mg/dL 0.76  Sodium 135 - 145 mmol/L 141  Potassium 3.5 - 5.1 mmol/L 3.8  Chloride 98 - 111 mmol/L 110  CO2 22 - 32 mmol/L 24  Calcium 8.9 - 10.3 mg/dL 8.4(L)  Total Protein 6.0 - 8.5 g/dL -  Total Bilirubin 0.0 - 1.2 mg/dL -  Alkaline Phos 39 - 117 IU/L -  AST 0 - 40 IU/L -  ALT 0 - 44 IU/L -   CBC Latest Ref Rng & Units 07/06/2019  WBC 4.0 - 10.5 K/uL 0.7(LL)  Hemoglobin 13.0 - 17.0 g/dL 6.1(L)  Hematocrit 39.0 - 52.0 % 17.8(L)  Platelets 150 - 400 K/uL 16(LL)     Observation/objective:appears in no acute distress over video visit today. 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 He needs bone marrow biopsy for definitive diagnosis. Discussed natural history of AML and potential life threatening events such as blast crisis if not treated in a timely manner. Patient is relatively young and does not have significant comorbidities.  He would benefit from intensive induction chemotherapy and consideration for bone marrow transplant down the line.  Discussed that this would need to be treated in a tertiary center.  I have discussed his case with [facility] leukemia [facility] and it would be in his best interest to go to the [facility] ER to get inpatient treatment for his AML.  He will likely require inpatient admission and stated that would go for 3 to 4 weeks and he needs to make arrangements for the same.  He is not in blast crisis and has severe pancytopenia and I will therefore not start him on Hydrea at this time.  Also baseline DIC labs are normal and flow cytometry is not suggestive of APL.  Patient understands that AML with severe pancytopenia is a medical emergency and he agrees to go to [facility] ER today.  All his future follow-ups and appointments will be at [facility] and he does not require any follow-up with me at this time   Follow-up instructions: none  I discussed the assessment and treatment plan with the patient. The patient was provided an opportunity to ask questions and all were answered. The patient agreed with the plan and demonstrated an understanding of the instructions.   The patient was advised to call back or seek an in-person evaluation if the symptoms worsen or if the condition fails to improve as anticipated.  I provided 35 minutes of face-to-face video visit time during this encounter, and > 50% was spent counseling as documented under my assessment & plan.  Visit Diagnosis: 1. Acute myeloid leukemia not having achieved remission ([place])     Dr. Randa Evens, MD, MPH [facility] at  [facility] Tel- [phone] 07/10/2019 4:53 PM

## 2019-07-11 MED ORDER — VALACYCLOVIR HCL 500 MG PO TABS
500.00 | ORAL_TABLET | ORAL | Status: DC
Start: 2019-08-07 — End: 2019-07-11

## 2019-07-11 MED ORDER — LEVOFLOXACIN 500 MG PO TABS
500.00 | ORAL_TABLET | ORAL | Status: DC
Start: 2019-07-18 — End: 2019-07-11

## 2019-07-17 MED ORDER — ACETAMINOPHEN 325 MG PO TABS
650.00 | ORAL_TABLET | ORAL | Status: DC
Start: ? — End: 2019-07-17

## 2019-07-17 MED ORDER — SODIUM CHLORIDE FLUSH 0.9 % IV SOLN
10.00 | INTRAVENOUS | Status: DC
Start: 2019-08-06 — End: 2019-07-17

## 2019-07-17 MED ORDER — GENERIC EXTERNAL MEDICATION
100.00 | Status: DC
Start: 2019-07-18 — End: 2019-07-17

## 2019-07-17 MED ORDER — DIPHENHYDRAMINE HCL 50 MG/ML IJ SOLN
25.00 | INTRAMUSCULAR | Status: DC
Start: ? — End: 2019-07-17

## 2019-07-17 MED ORDER — SODIUM CHLORIDE 0.9 % IV SOLN
20.00 | INTRAVENOUS | Status: DC
Start: ? — End: 2019-07-17

## 2019-07-17 MED ORDER — FAMOTIDINE 20 MG/2ML IV SOLN
20.00 | INTRAVENOUS | Status: DC
Start: ? — End: 2019-07-17

## 2019-07-17 MED ORDER — MELATONIN 3 MG PO TABS
3.00 | ORAL_TABLET | ORAL | Status: DC
Start: 2019-08-06 — End: 2019-07-17

## 2019-07-17 MED ORDER — ONDANSETRON HCL 8 MG PO TABS
24.00 | ORAL_TABLET | ORAL | Status: DC
Start: 2019-07-18 — End: 2019-07-17

## 2019-07-17 MED ORDER — GENERIC EXTERNAL MEDICATION
Status: DC
Start: ? — End: 2019-07-17

## 2019-07-17 MED ORDER — PROCHLORPERAZINE EDISYLATE 10 MG/2ML IJ SOLN
10.00 | INTRAMUSCULAR | Status: DC
Start: ? — End: 2019-07-17

## 2019-07-17 MED ORDER — SODIUM CHLORIDE 0.9 % IV SOLN
1000.00 | INTRAVENOUS | Status: DC
Start: ? — End: 2019-07-17

## 2019-07-17 MED ORDER — METHYLPREDNISOLONE SODIUM SUCC 125 MG IJ SOLR
125.00 | INTRAMUSCULAR | Status: DC
Start: ? — End: 2019-07-17

## 2019-07-17 MED ORDER — EPINEPHRINE 0.3 MG/0.3ML IJ SOAJ
0.30 | INTRAMUSCULAR | Status: DC
Start: ? — End: 2019-07-17

## 2019-07-17 MED ORDER — POLYETHYLENE GLYCOL 3350 17 GM/SCOOP PO POWD
17.00 | ORAL | Status: DC
Start: 2019-07-18 — End: 2019-07-17

## 2019-07-17 MED ORDER — PROCHLORPERAZINE MALEATE 10 MG PO TABS
10.00 | ORAL_TABLET | ORAL | Status: DC
Start: ? — End: 2019-07-17

## 2019-07-17 MED ORDER — MEPERIDINE HCL 25 MG/ML IJ SOLN
25.00 | INTRAMUSCULAR | Status: DC
Start: ? — End: 2019-07-17

## 2019-07-30 ENCOUNTER — Telehealth: Payer: Self-pay

## 2019-07-30 MED ORDER — POSACONAZOLE 100 MG PO TBEC
300.00 | DELAYED_RELEASE_TABLET | ORAL | Status: DC
Start: 2019-08-07 — End: 2019-07-30

## 2019-07-30 MED ORDER — DIPHENHYDRAMINE HCL 25 MG PO CAPS
25.00 | ORAL_CAPSULE | ORAL | Status: DC
Start: ? — End: 2019-07-30

## 2019-07-30 MED ORDER — GENERIC EXTERNAL MEDICATION
10.00 | Status: DC
Start: ? — End: 2019-07-30

## 2019-07-30 MED ORDER — SIMETHICONE 80 MG PO CHEW
80.00 | CHEWABLE_TABLET | ORAL | Status: DC
Start: ? — End: 2019-07-30

## 2019-07-30 MED ORDER — SODIUM CHLORIDE 0.9 % IV SOLN
INTRAVENOUS | Status: DC
Start: ? — End: 2019-07-30

## 2019-07-30 MED ORDER — CEFEPIME HCL 2 G IJ SOLR
2.00 | INTRAMUSCULAR | Status: DC
Start: 2019-07-30 — End: 2019-07-30

## 2019-07-30 MED ORDER — POLYETHYLENE GLYCOL 3350 17 GM/SCOOP PO POWD
17.00 | ORAL | Status: DC
Start: ? — End: 2019-07-30

## 2019-07-30 NOTE — Telephone Encounter (Signed)
Copied from [place] [id number]. Topic: General - Other >> Jul 30, 2019 11:43 AM Oneta Rack wrote: Patient wanted PCP to be aware [place] and will call back to [facility] physical. Patient also wanted to Thank Dr. Natale Milch for ordering blood work that caught leukemia

## 2019-08-03 ENCOUNTER — Encounter: Payer: 59 | Admitting: Family Medicine

## 2019-08-03 MED ORDER — CEFEPIME HCL 2 G IJ SOLR
2.00 | INTRAMUSCULAR | Status: DC
Start: 2019-08-03 — End: 2019-08-03

## 2019-08-03 MED ORDER — PHENOL 1.4 % MT LIQD
2.00 | OROMUCOSAL | Status: DC
Start: ? — End: 2019-08-03

## 2019-08-03 MED ORDER — LEVOFLOXACIN 500 MG PO TABS
500.00 | ORAL_TABLET | ORAL | Status: DC
Start: 2019-08-07 — End: 2019-08-03

## 2019-08-05 ENCOUNTER — Ambulatory Visit (INDEPENDENT_AMBULATORY_CARE_PROVIDER_SITE_OTHER): Payer: 59 | Admitting: Psychology

## 2019-08-05 DIAGNOSIS — F411 Generalized anxiety disorder: Secondary | ICD-10-CM | POA: Diagnosis not present

## 2019-08-05 DIAGNOSIS — C9200 Acute myeloblastic leukemia, not having achieved remission: Principal | ICD-10-CM

## 2019-08-06 ENCOUNTER — Telehealth: Payer: Self-pay | Admitting: Family Medicine

## 2019-08-06 DIAGNOSIS — G609 Hereditary and idiopathic neuropathy, unspecified: Secondary | ICD-10-CM

## 2019-08-06 MED ORDER — VALACYCLOVIR 500 MG TABLET: 500 mg | each | Freq: Every day | 0 refills | 30 days | Status: AC

## 2019-08-06 MED ORDER — PREGABALIN 75 MG PO CAPS: 75.0000 mg | ORAL_CAPSULE | Freq: Two times a day (BID) | ORAL | 1 refills | Status: AC

## 2019-08-06 NOTE — Telephone Encounter (Signed)
RX REFILL pregabalin (LYRICA) 75 MG capsule  [place], [place] Phone:  [phone]  Fax:  [phone]

## 2019-08-07 MED ORDER — VALACYCLOVIR 500 MG TABLET
Freq: Every day | ORAL | 0 refills | 30.00000 days | Status: CP
Start: 2019-08-07 — End: 2019-08-06

## 2019-08-09 ENCOUNTER — Encounter
Admit: 2019-08-09 | Discharge: 2019-08-09 | Payer: BLUE CROSS/BLUE SHIELD | Attending: Adult Health | Primary: Adult Health

## 2019-08-09 ENCOUNTER — Encounter: Admit: 2019-08-09 | Discharge: 2019-08-09 | Payer: BLUE CROSS/BLUE SHIELD

## 2019-08-09 DIAGNOSIS — C9200 Acute myeloblastic leukemia, not having achieved remission: Principal | ICD-10-CM

## 2019-08-10 ENCOUNTER — Encounter: Admit: 2019-08-10 | Discharge: 2019-08-11 | Payer: BLUE CROSS/BLUE SHIELD | Attending: Pharmacist | Primary: Pharmacist

## 2019-08-10 MED ORDER — VALACYCLOVIR 500 MG TABLET
ORAL_TABLET | Freq: Every day | ORAL | 3 refills | 90.00000 days | Status: CP
Start: 2019-08-10 — End: ?

## 2019-08-12 ENCOUNTER — Encounter: Admit: 2019-08-12 | Discharge: 2019-08-13 | Payer: BLUE CROSS/BLUE SHIELD

## 2019-08-12 ENCOUNTER — Encounter
Admit: 2019-08-12 | Discharge: 2019-08-13 | Payer: BLUE CROSS/BLUE SHIELD | Attending: Adult Health | Primary: Adult Health

## 2019-08-12 DIAGNOSIS — C9200 Acute myeloblastic leukemia, not having achieved remission: Principal | ICD-10-CM

## 2019-08-18 ENCOUNTER — Encounter: Admit: 2019-08-18 | Discharge: 2019-08-18 | Payer: BLUE CROSS/BLUE SHIELD

## 2019-08-18 DIAGNOSIS — C9200 Acute myeloblastic leukemia, not having achieved remission: Principal | ICD-10-CM

## 2019-08-19 ENCOUNTER — Ambulatory Visit: Admit: 2019-08-19 | Discharge: 2019-08-20 | Payer: BLUE CROSS/BLUE SHIELD

## 2019-08-19 ENCOUNTER — Encounter: Admit: 2019-08-19 | Discharge: 2019-08-20 | Payer: BLUE CROSS/BLUE SHIELD | Attending: Hematology | Primary: Hematology

## 2019-08-19 DIAGNOSIS — C9201 Acute myeloblastic leukemia, in remission: Principal | ICD-10-CM

## 2019-08-21 ENCOUNTER — Ambulatory Visit
Admit: 2019-08-21 | Discharge: 2019-08-22 | Payer: BLUE CROSS/BLUE SHIELD | Attending: Nurse Practitioner | Primary: Nurse Practitioner

## 2019-08-24 ENCOUNTER — Ambulatory Visit
Admit: 2019-08-24 | Discharge: 2019-08-29 | Disposition: A | Discharge status: Home / Self Care | Payer: BLUE CROSS/BLUE SHIELD | Admitting: Internal Medicine

## 2019-08-24 ENCOUNTER — Other Ambulatory Visit
Admit: 2019-08-24 | Discharge: 2019-08-29 | Disposition: A | Discharge status: Home / Self Care | Payer: BLUE CROSS/BLUE SHIELD | Admitting: Internal Medicine

## 2019-08-24 DIAGNOSIS — C9200 Acute myeloblastic leukemia, not having achieved remission: Principal | ICD-10-CM

## 2019-08-24 NOTE — Unmapped (Signed)
Converted to inpatient H&P.

## 2019-08-29 MED ORDER — PROCHLORPERAZINE MALEATE 10 MG TABLET
ORAL_TABLET | Freq: Four times a day (QID) | ORAL | 1 refills | 8 days | Status: CP | PRN
Start: 2019-08-29 — End: ?

## 2019-08-29 MED ORDER — LEVOFLOXACIN 500 MG TABLET
ORAL_TABLET | Freq: Every day | ORAL | 0 refills | 15 days | Status: CP
Start: 2019-08-29 — End: 2019-09-13

## 2019-08-29 MED ORDER — FLUCONAZOLE 200 MG TABLET
ORAL_TABLET | Freq: Every day | ORAL | 0 refills | 15 days | Status: CP
Start: 2019-08-29 — End: 2019-09-13

## 2019-08-29 MED ORDER — PREDNISOLONE ACETATE 1 % EYE DROPS,SUSPENSION
Freq: Four times a day (QID) | OPHTHALMIC | 0 refills | 0 days
Start: 2019-08-29 — End: ?

## 2019-08-29 MED ORDER — DSS 100 MG PO CAPS
100.00 | ORAL_CAPSULE | ORAL | Status: DC
Start: ? — End: 2019-08-29

## 2019-08-29 MED ORDER — VALACYCLOVIR HCL 500 MG PO TABS
500.00 | ORAL_TABLET | ORAL | Status: DC
Start: 2019-08-30 — End: 2019-08-29

## 2019-08-29 MED ORDER — DIPHENHYDRAMINE HCL 50 MG/ML IJ SOLN
25.00 | INTRAMUSCULAR | Status: DC
Start: ? — End: 2019-08-29

## 2019-08-29 MED ORDER — SODIUM CHLORIDE 0.9 % IV SOLN
1000.00 | INTRAVENOUS | Status: DC
Start: ? — End: 2019-08-29

## 2019-08-29 MED ORDER — LOPERAMIDE HCL 2 MG PO CAPS
4.00 | ORAL_CAPSULE | ORAL | Status: DC
Start: ? — End: 2019-08-29

## 2019-08-29 MED ORDER — PREDNISOLONE ACETATE 1 % OP SUSP
2.00 | OPHTHALMIC | Status: DC
Start: 2019-08-29 — End: 2019-08-29

## 2019-08-29 MED ORDER — PREGABALIN 75 MG PO CAPS
75.00 | ORAL_CAPSULE | ORAL | Status: DC
Start: 2019-08-29 — End: 2019-08-29

## 2019-08-29 MED ORDER — POLYETHYLENE GLYCOL 3350 17 GM/SCOOP PO POWD
17.00 | ORAL | Status: DC
Start: ? — End: 2019-08-29

## 2019-08-29 MED ORDER — MEPERIDINE HCL 25 MG/ML IJ SOLN
25.00 | INTRAMUSCULAR | Status: DC
Start: ? — End: 2019-08-29

## 2019-08-29 MED ORDER — POTASSIUM CHLORIDE CRYS ER 20 MEQ PO TBCR
60.00 | EXTENDED_RELEASE_TABLET | ORAL | Status: DC
Start: ? — End: 2019-08-29

## 2019-08-29 MED ORDER — LUBRIDERM DAILY MOISTURE EX LOTN
1.00 | TOPICAL_LOTION | CUTANEOUS | Status: DC
Start: ? — End: 2019-08-29

## 2019-08-29 MED ORDER — METHYLPREDNISOLONE SODIUM SUCC 125 MG IJ SOLR
125.00 | INTRAMUSCULAR | Status: DC
Start: ? — End: 2019-08-29

## 2019-08-29 MED ORDER — NICOTINE 21 MG/24HR TD PT24
1.00 | MEDICATED_PATCH | TRANSDERMAL | Status: DC
Start: 2019-08-30 — End: 2019-08-29

## 2019-08-29 MED ORDER — FAMOTIDINE 20 MG/2ML IV SOLN
20.00 | INTRAVENOUS | Status: DC
Start: ? — End: 2019-08-29

## 2019-08-29 MED ORDER — SODIUM CHLORIDE 0.9 % IV SOLN
20.00 | INTRAVENOUS | Status: DC
Start: ? — End: 2019-08-29

## 2019-08-29 MED ORDER — PROCHLORPERAZINE EDISYLATE 10 MG/2ML IJ SOLN
10.00 | INTRAMUSCULAR | Status: DC
Start: ? — End: 2019-08-29

## 2019-08-29 MED ORDER — GENERIC EXTERNAL MEDICATION
Status: DC
Start: ? — End: 2019-08-29

## 2019-08-29 MED ORDER — SODIUM CHLORIDE FLUSH 0.9 % IV SOLN
10.00 | INTRAVENOUS | Status: DC
Start: 2019-08-29 — End: 2019-08-29

## 2019-08-29 MED ORDER — POTASSIUM CHLORIDE CRYS ER 10 MEQ PO TBCR
20.00 | EXTENDED_RELEASE_TABLET | ORAL | Status: DC
Start: ? — End: 2019-08-29

## 2019-08-29 MED ORDER — LOPERAMIDE HCL 2 MG PO CAPS
2.00 | ORAL_CAPSULE | ORAL | Status: DC
Start: ? — End: 2019-08-29

## 2019-08-29 MED ORDER — SIMETHICONE 80 MG PO CHEW
80.00 | CHEWABLE_TABLET | ORAL | Status: DC
Start: ? — End: 2019-08-29

## 2019-08-29 MED ORDER — CARBOXYMETHYLCELLULOSE SODIUM 0.25 % OP SOLN
2.00 | OPHTHALMIC | Status: DC
Start: ? — End: 2019-08-29

## 2019-08-29 MED ORDER — POTASSIUM CHLORIDE CRYS ER 10 MEQ PO TBCR
40.00 | EXTENDED_RELEASE_TABLET | ORAL | Status: DC
Start: ? — End: 2019-08-29

## 2019-08-29 MED ORDER — PROCHLORPERAZINE MALEATE 10 MG PO TABS
10.00 | ORAL_TABLET | ORAL | Status: DC
Start: ? — End: 2019-08-29

## 2019-08-29 MED ORDER — EPINEPHRINE 0.3 MG/0.3ML IJ SOAJ
0.30 | INTRAMUSCULAR | Status: DC
Start: ? — End: 2019-08-29

## 2019-08-30 ENCOUNTER — Encounter: Admit: 2019-08-30 | Discharge: 2019-08-31 | Payer: BLUE CROSS/BLUE SHIELD

## 2019-08-30 ENCOUNTER — Other Ambulatory Visit: Admit: 2019-08-30 | Discharge: 2019-08-31 | Payer: BLUE CROSS/BLUE SHIELD

## 2019-08-30 DIAGNOSIS — Z7682 Awaiting organ transplant status: Principal | ICD-10-CM

## 2019-08-30 DIAGNOSIS — C9200 Acute myeloblastic leukemia, not having achieved remission: Principal | ICD-10-CM

## 2019-08-31 ENCOUNTER — Telehealth: Admit: 2019-08-31 | Discharge: 2019-09-01 | Payer: BLUE CROSS/BLUE SHIELD | Attending: Clinical | Primary: Clinical

## 2019-09-01 ENCOUNTER — Encounter: Admit: 2019-09-01 | Discharge: 2019-09-02 | Payer: BLUE CROSS/BLUE SHIELD

## 2019-09-01 DIAGNOSIS — C9201 Acute myeloblastic leukemia, in remission: Principal | ICD-10-CM

## 2019-09-01 DIAGNOSIS — C9200 Acute myeloblastic leukemia, not having achieved remission: Principal | ICD-10-CM

## 2019-09-02 DIAGNOSIS — Z7682 Awaiting organ transplant status: Principal | ICD-10-CM

## 2019-09-03 ENCOUNTER — Encounter: Admit: 2019-09-03 | Discharge: 2019-09-04 | Payer: BLUE CROSS/BLUE SHIELD

## 2019-09-03 ENCOUNTER — Other Ambulatory Visit: Admit: 2019-09-03 | Discharge: 2019-09-04 | Payer: BLUE CROSS/BLUE SHIELD

## 2019-09-03 DIAGNOSIS — Z7682 Awaiting organ transplant status: Principal | ICD-10-CM

## 2019-09-03 DIAGNOSIS — C9200 Acute myeloblastic leukemia, not having achieved remission: Principal | ICD-10-CM

## 2019-09-06 ENCOUNTER — Encounter: Admit: 2019-09-06 | Discharge: 2019-09-07 | Payer: BLUE CROSS/BLUE SHIELD

## 2019-09-06 DIAGNOSIS — C9200 Acute myeloblastic leukemia, not having achieved remission: Principal | ICD-10-CM

## 2019-09-08 ENCOUNTER — Encounter: Admit: 2019-09-08 | Discharge: 2019-09-09 | Payer: BLUE CROSS/BLUE SHIELD

## 2019-09-08 DIAGNOSIS — C9201 Acute myeloblastic leukemia, in remission: Principal | ICD-10-CM

## 2019-09-10 ENCOUNTER — Encounter: Admit: 2019-09-10 | Discharge: 2019-09-11 | Payer: BLUE CROSS/BLUE SHIELD

## 2019-09-10 DIAGNOSIS — C9201 Acute myeloblastic leukemia, in remission: Principal | ICD-10-CM

## 2019-09-14 ENCOUNTER — Ambulatory Visit: Admit: 2019-09-14 | Discharge: 2019-09-15 | Payer: BLUE CROSS/BLUE SHIELD

## 2019-09-14 DIAGNOSIS — C9200 Acute myeloblastic leukemia, not having achieved remission: Principal | ICD-10-CM

## 2019-09-14 DIAGNOSIS — C9201 Acute myeloblastic leukemia, in remission: Principal | ICD-10-CM

## 2019-09-15 ENCOUNTER — Encounter: Admit: 2019-09-15 | Discharge: 2019-09-16 | Payer: BLUE CROSS/BLUE SHIELD

## 2019-09-15 DIAGNOSIS — Z7682 Awaiting organ transplant status: Principal | ICD-10-CM

## 2019-09-20 ENCOUNTER — Encounter: Admit: 2019-09-20 | Discharge: 2019-09-21 | Payer: BLUE CROSS/BLUE SHIELD

## 2019-09-20 DIAGNOSIS — C9201 Acute myeloblastic leukemia, in remission: Principal | ICD-10-CM

## 2019-09-20 DIAGNOSIS — F411 Generalized anxiety disorder: Principal | ICD-10-CM

## 2019-09-20 DIAGNOSIS — D61818 Other pancytopenia: Principal | ICD-10-CM

## 2019-09-20 DIAGNOSIS — Z0000 Encounter for general adult medical examination without abnormal findings: Principal | ICD-10-CM

## 2019-09-20 DIAGNOSIS — F17200 Nicotine dependence, unspecified, uncomplicated: Principal | ICD-10-CM

## 2019-09-20 DIAGNOSIS — C9200 Acute myeloblastic leukemia, not having achieved remission: Principal | ICD-10-CM

## 2019-09-20 DIAGNOSIS — Z136 Encounter for screening for cardiovascular disorders: Principal | ICD-10-CM

## 2019-09-20 DIAGNOSIS — Z13220 Encounter for screening for lipoid disorders: Principal | ICD-10-CM

## 2019-09-20 MED ORDER — ESCITALOPRAM 10 MG TABLET
ORAL_TABLET | Freq: Every day | ORAL | 2 refills | 30.00000 days | Status: CP
Start: 2019-09-20 — End: 2020-09-19

## 2019-09-20 MED ORDER — NICOTINE 21 MG/24 HR DAILY TRANSDERMAL PATCH
MEDICATED_PATCH | TRANSDERMAL | 11 refills | 28 days | Status: CP
Start: 2019-09-20 — End: ?

## 2019-09-21 ENCOUNTER — Ambulatory Visit: Admit: 2019-09-21 | Discharge: 2019-09-22 | Payer: BLUE CROSS/BLUE SHIELD

## 2019-09-21 ENCOUNTER — Encounter: Admit: 2019-09-21 | Discharge: 2019-09-22 | Payer: BLUE CROSS/BLUE SHIELD

## 2019-09-21 DIAGNOSIS — C9201 Acute myeloblastic leukemia, in remission: Principal | ICD-10-CM

## 2019-09-23 ENCOUNTER — Ambulatory Visit: Admit: 2019-09-23 | Discharge: 2019-09-23 | Payer: BLUE CROSS/BLUE SHIELD

## 2019-09-23 ENCOUNTER — Other Ambulatory Visit: Admit: 2019-09-23 | Discharge: 2019-09-23 | Payer: BLUE CROSS/BLUE SHIELD

## 2019-09-23 ENCOUNTER — Encounter: Admit: 2019-09-23 | Discharge: 2019-09-23 | Payer: BLUE CROSS/BLUE SHIELD | Attending: Hematology | Primary: Hematology

## 2019-09-23 DIAGNOSIS — C9200 Acute myeloblastic leukemia, not having achieved remission: Principal | ICD-10-CM

## 2019-09-23 DIAGNOSIS — C9201 Acute myeloblastic leukemia, in remission: Principal | ICD-10-CM

## 2019-09-27 DIAGNOSIS — Z01812 Encounter for preprocedural laboratory examination: Principal | ICD-10-CM

## 2019-09-27 DIAGNOSIS — Z20822 Encounter for preprocedure screening laboratory testing for COVID-19: Principal | ICD-10-CM

## 2019-09-28 ENCOUNTER — Encounter: Admit: 2019-09-28 | Discharge: 2019-09-28 | Payer: BLUE CROSS/BLUE SHIELD

## 2019-09-29 ENCOUNTER — Other Ambulatory Visit
Admit: 2019-09-29 | Discharge: 2019-10-02 | Disposition: A | Discharge status: Home / Self Care | Payer: BLUE CROSS/BLUE SHIELD | Admitting: Internal Medicine

## 2019-09-29 ENCOUNTER — Ambulatory Visit
Admit: 2019-09-29 | Discharge: 2019-10-02 | Disposition: A | Discharge status: Home / Self Care | Payer: BLUE CROSS/BLUE SHIELD | Admitting: Internal Medicine

## 2019-09-29 DIAGNOSIS — C9200 Acute myeloblastic leukemia, not having achieved remission: Principal | ICD-10-CM

## 2019-10-01 MED ORDER — PROCHLORPERAZINE MALEATE 10 MG TABLET
ORAL_TABLET | Freq: Four times a day (QID) | ORAL | 1 refills | 8.00000 days | Status: CP | PRN
Start: 2019-10-01 — End: ?

## 2019-10-01 MED ORDER — LEVOFLOXACIN 500 MG TABLET
ORAL_TABLET | Freq: Every day | ORAL | 0 refills | 15.00000 days | Status: CP
Start: 2019-10-01 — End: 2019-10-16

## 2019-10-01 MED ORDER — FLUCONAZOLE 200 MG TABLET
ORAL_TABLET | Freq: Every day | ORAL | 0 refills | 15 days | Status: CP
Start: 2019-10-01 — End: 2019-10-16

## 2019-10-04 ENCOUNTER — Encounter: Admit: 2019-10-04 | Discharge: 2019-10-05 | Payer: BLUE CROSS/BLUE SHIELD

## 2019-10-04 ENCOUNTER — Ambulatory Visit: Admit: 2019-10-04 | Discharge: 2019-10-05 | Payer: BLUE CROSS/BLUE SHIELD

## 2019-10-04 DIAGNOSIS — C9201 Acute myeloblastic leukemia, in remission: Principal | ICD-10-CM

## 2019-10-06 ENCOUNTER — Encounter: Admit: 2019-10-06 | Discharge: 2019-10-06 | Payer: BLUE CROSS/BLUE SHIELD

## 2019-10-06 DIAGNOSIS — C9201 Acute myeloblastic leukemia, in remission: Principal | ICD-10-CM

## 2019-10-06 DIAGNOSIS — C9200 Acute myeloblastic leukemia, not having achieved remission: Principal | ICD-10-CM

## 2019-10-08 ENCOUNTER — Encounter: Admit: 2019-10-08 | Discharge: 2019-10-09 | Payer: BLUE CROSS/BLUE SHIELD

## 2019-10-08 DIAGNOSIS — C9201 Acute myeloblastic leukemia, in remission: Principal | ICD-10-CM

## 2019-10-11 ENCOUNTER — Encounter: Admit: 2019-10-11 | Discharge: 2019-10-12 | Payer: BLUE CROSS/BLUE SHIELD

## 2019-10-12 ENCOUNTER — Encounter: Admit: 2019-10-12 | Discharge: 2019-10-13 | Payer: BLUE CROSS/BLUE SHIELD | Attending: Psychiatry | Primary: Psychiatry

## 2019-10-12 DIAGNOSIS — F411 Generalized anxiety disorder: Principal | ICD-10-CM

## 2019-10-12 DIAGNOSIS — F4323 Adjustment disorder with mixed anxiety and depressed mood: Principal | ICD-10-CM

## 2019-10-12 DIAGNOSIS — F1111 Opioid abuse, in remission: Principal | ICD-10-CM

## 2019-10-12 MED ORDER — VILAZODONE 10 MG TABLET
ORAL_TABLET | Freq: Every day | ORAL | 1 refills | 30 days | Status: CP
Start: 2019-10-12 — End: ?

## 2019-10-15 ENCOUNTER — Institutional Professional Consult (permissible substitution): Admit: 2019-10-15 | Discharge: 2019-10-15 | Payer: BLUE CROSS/BLUE SHIELD

## 2019-10-15 ENCOUNTER — Encounter: Admit: 2019-10-15 | Discharge: 2019-10-15 | Payer: BLUE CROSS/BLUE SHIELD

## 2019-10-15 DIAGNOSIS — C9201 Acute myeloblastic leukemia, in remission: Principal | ICD-10-CM

## 2019-10-26 ENCOUNTER — Encounter: Admit: 2019-10-26 | Discharge: 2019-10-27 | Payer: BLUE CROSS/BLUE SHIELD

## 2019-10-26 DIAGNOSIS — C9200 Acute myeloblastic leukemia, not having achieved remission: Principal | ICD-10-CM

## 2019-10-26 DIAGNOSIS — C9201 Acute myeloblastic leukemia, in remission: Principal | ICD-10-CM

## 2019-10-28 ENCOUNTER — Encounter: Admit: 2019-10-28 | Discharge: 2019-10-29 | Payer: BLUE CROSS/BLUE SHIELD | Attending: Hematology | Primary: Hematology

## 2019-10-28 ENCOUNTER — Other Ambulatory Visit: Admit: 2019-10-28 | Discharge: 2019-10-29 | Payer: BLUE CROSS/BLUE SHIELD

## 2019-10-28 ENCOUNTER — Ambulatory Visit: Admit: 2019-10-28 | Discharge: 2019-10-29 | Payer: BLUE CROSS/BLUE SHIELD

## 2019-10-28 DIAGNOSIS — C9200 Acute myeloblastic leukemia, not having achieved remission: Principal | ICD-10-CM

## 2019-10-29 ENCOUNTER — Ambulatory Visit
Admit: 2019-10-29 | Discharge: 2019-10-30 | Payer: BLUE CROSS/BLUE SHIELD | Attending: Physician Assistant | Primary: Physician Assistant

## 2019-10-29 DIAGNOSIS — G5793 Unspecified mononeuropathy of bilateral lower limbs: Principal | ICD-10-CM

## 2019-10-29 MED ORDER — PREGABALIN 75 MG CAPSULE
ORAL_CAPSULE | Freq: Two times a day (BID) | ORAL | 3 refills | 90.00000 days | Status: CP
Start: 2019-10-29 — End: 2020-01-27

## 2019-11-01 ENCOUNTER — Ambulatory Visit
Admit: 2019-11-01 | Discharge: 2019-11-04 | Disposition: A | Discharge status: Home / Self Care | Payer: BLUE CROSS/BLUE SHIELD | Admitting: Hematology & Oncology

## 2019-11-01 ENCOUNTER — Ambulatory Visit
Admit: 2019-11-02 | Discharge: 2019-11-03 | Disposition: A | Discharge status: Home / Self Care | Payer: BLUE CROSS/BLUE SHIELD | Admitting: Hematology & Oncology

## 2019-11-01 ENCOUNTER — Other Ambulatory Visit
Admit: 2019-11-01 | Discharge: 2019-11-04 | Disposition: A | Discharge status: Home / Self Care | Payer: BLUE CROSS/BLUE SHIELD | Admitting: Hematology & Oncology

## 2019-11-01 DIAGNOSIS — C9200 Acute myeloblastic leukemia, not having achieved remission: Principal | ICD-10-CM

## 2019-11-04 MED ORDER — PREDNISOLONE ACETATE 1 % EYE DROPS,SUSPENSION
Freq: Four times a day (QID) | OPHTHALMIC | 0 refills | 13 days | Status: CP
Start: 2019-11-04 — End: 2019-11-11

## 2019-11-04 MED ORDER — VALACYCLOVIR 500 MG TABLET
ORAL_TABLET | Freq: Every day | ORAL | 3 refills | 90.00000 days | Status: CP
Start: 2019-11-04 — End: ?

## 2019-11-05 ENCOUNTER — Encounter: Admit: 2019-11-05 | Discharge: 2019-11-05 | Payer: BLUE CROSS/BLUE SHIELD

## 2019-11-05 ENCOUNTER — Institutional Professional Consult (permissible substitution): Admit: 2019-11-05 | Discharge: 2019-11-05 | Payer: BLUE CROSS/BLUE SHIELD

## 2019-11-05 DIAGNOSIS — C9200 Acute myeloblastic leukemia, not having achieved remission: Principal | ICD-10-CM

## 2019-11-05 MED ORDER — LEVOFLOXACIN 500 MG TABLET
ORAL_TABLET | Freq: Every day | ORAL | 0 refills | 15 days | Status: CP
Start: 2019-11-05 — End: 2019-11-20

## 2019-11-05 MED ORDER — FLUCONAZOLE 200 MG TABLET
ORAL_TABLET | Freq: Every day | ORAL | 0 refills | 15.00000 days | Status: CP
Start: 2019-11-05 — End: 2019-11-20

## 2019-11-06 ENCOUNTER — Encounter: Admit: 2019-11-06 | Discharge: 2019-11-06 | Payer: BLUE CROSS/BLUE SHIELD

## 2019-11-06 ENCOUNTER — Encounter: Admit: 2019-11-06 | Discharge: 2019-11-07 | Payer: PRIVATE HEALTH INSURANCE

## 2019-11-09 ENCOUNTER — Encounter: Admit: 2019-11-09 | Discharge: 2019-11-10 | Payer: PRIVATE HEALTH INSURANCE

## 2019-11-09 ENCOUNTER — Ambulatory Visit: Admit: 2019-11-09 | Discharge: 2019-11-10 | Payer: BLUE CROSS/BLUE SHIELD

## 2019-11-09 ENCOUNTER — Encounter: Admit: 2019-11-09 | Discharge: 2019-11-09 | Payer: BLUE CROSS/BLUE SHIELD

## 2019-11-09 DIAGNOSIS — C9201 Acute myeloblastic leukemia, in remission: Principal | ICD-10-CM

## 2019-11-09 DIAGNOSIS — C9200 Acute myeloblastic leukemia, not having achieved remission: Principal | ICD-10-CM

## 2019-11-09 DIAGNOSIS — F5104 Psychophysiologic insomnia: Principal | ICD-10-CM

## 2019-11-09 MED ORDER — TRAZODONE 50 MG TABLET
ORAL_TABLET | Freq: Every evening | ORAL | 3 refills | 90 days | Status: CP | PRN
Start: 2019-11-09 — End: 2019-12-09

## 2019-11-10 DIAGNOSIS — Z005 Encounter for examination of potential donor of organ and tissue: Principal | ICD-10-CM

## 2019-11-11 DIAGNOSIS — Z005 Encounter for examination of potential donor of organ and tissue: Principal | ICD-10-CM

## 2019-11-12 ENCOUNTER — Encounter: Admit: 2019-11-12 | Discharge: 2019-11-13 | Payer: BLUE CROSS/BLUE SHIELD

## 2019-11-12 DIAGNOSIS — C9201 Acute myeloblastic leukemia, in remission: Principal | ICD-10-CM

## 2019-11-15 ENCOUNTER — Ambulatory Visit: Admit: 2019-11-15 | Discharge: 2019-11-15 | Payer: BLUE CROSS/BLUE SHIELD

## 2019-11-15 DIAGNOSIS — C9201 Acute myeloblastic leukemia, in remission: Principal | ICD-10-CM

## 2019-11-17 MED ORDER — VARENICLINE 0.5 MG TABLET
ORAL_TABLET | Freq: Every day | ORAL | 2 refills | 30.00000 days | Status: CP
Start: 2019-11-17 — End: 2020-02-15

## 2019-11-19 ENCOUNTER — Encounter: Admit: 2019-11-19 | Discharge: 2019-11-20 | Payer: BLUE CROSS/BLUE SHIELD

## 2019-11-19 DIAGNOSIS — C9201 Acute myeloblastic leukemia, in remission: Principal | ICD-10-CM

## 2019-11-24 ENCOUNTER — Encounter
Admit: 2019-11-24 | Discharge: 2019-11-24 | Payer: BLUE CROSS/BLUE SHIELD | Attending: Adult Health | Primary: Adult Health

## 2019-11-24 ENCOUNTER — Encounter: Admit: 2019-11-24 | Discharge: 2019-11-24 | Payer: BLUE CROSS/BLUE SHIELD

## 2019-11-24 DIAGNOSIS — C9200 Acute myeloblastic leukemia, not having achieved remission: Principal | ICD-10-CM

## 2019-11-28 DIAGNOSIS — C9200 Acute myeloblastic leukemia, not having achieved remission: Principal | ICD-10-CM

## 2019-11-29 ENCOUNTER — Encounter: Admit: 2019-11-29 | Discharge: 2019-11-29 | Payer: BLUE CROSS/BLUE SHIELD

## 2019-11-29 ENCOUNTER — Ambulatory Visit
Admit: 2019-11-29 | Discharge: 2019-11-29 | Payer: BLUE CROSS/BLUE SHIELD | Attending: Physician Assistant | Primary: Physician Assistant

## 2019-11-29 DIAGNOSIS — C9200 Acute myeloblastic leukemia, not having achieved remission: Principal | ICD-10-CM

## 2019-11-29 MED ORDER — PROCHLORPERAZINE MALEATE 10 MG TABLET
ORAL_TABLET | Freq: Four times a day (QID) | ORAL | 2 refills | 8 days | Status: CP | PRN
Start: 2019-11-29 — End: ?

## 2019-11-30 ENCOUNTER — Ambulatory Visit (INDEPENDENT_AMBULATORY_CARE_PROVIDER_SITE_OTHER): Payer: No Typology Code available for payment source | Admitting: Psychology

## 2019-11-30 ENCOUNTER — Ambulatory Visit
Admit: 2019-11-30 | Discharge: 2019-12-03 | Disposition: A | Discharge status: Home / Self Care | Payer: BLUE CROSS/BLUE SHIELD

## 2019-11-30 DIAGNOSIS — F411 Generalized anxiety disorder: Secondary | ICD-10-CM

## 2019-11-30 DIAGNOSIS — C9200 Acute myeloblastic leukemia, not having achieved remission: Principal | ICD-10-CM

## 2019-12-03 MED ORDER — LEVOFLOXACIN 500 MG TABLET
ORAL_TABLET | Freq: Every day | ORAL | 0 refills | 30 days | Status: CP
Start: 2019-12-03 — End: ?

## 2019-12-03 MED ORDER — PREDNISOLONE ACETATE 1 % EYE DROPS,SUSPENSION
Freq: Four times a day (QID) | OPHTHALMIC | 0 refills | 0.00000 days
Start: 2019-12-03 — End: ?

## 2019-12-03 MED ORDER — FLUCONAZOLE 200 MG TABLET
ORAL_TABLET | Freq: Every day | ORAL | 0 refills | 30.00000 days | Status: CP
Start: 2019-12-03 — End: ?

## 2019-12-04 ENCOUNTER — Encounter: Admit: 2019-12-04 | Discharge: 2019-12-05 | Payer: BLUE CROSS/BLUE SHIELD

## 2019-12-06 ENCOUNTER — Ambulatory Visit: Admit: 2019-12-06 | Discharge: 2019-12-07 | Payer: BLUE CROSS/BLUE SHIELD

## 2019-12-06 DIAGNOSIS — C9201 Acute myeloblastic leukemia, in remission: Principal | ICD-10-CM

## 2019-12-08 ENCOUNTER — Encounter: Admit: 2019-12-08 | Discharge: 2019-12-08 | Payer: BLUE CROSS/BLUE SHIELD

## 2019-12-08 DIAGNOSIS — C9201 Acute myeloblastic leukemia, in remission: Principal | ICD-10-CM

## 2019-12-10 ENCOUNTER — Ambulatory Visit: Admit: 2019-12-10 | Discharge: 2019-12-11 | Payer: BLUE CROSS/BLUE SHIELD

## 2019-12-10 DIAGNOSIS — C9201 Acute myeloblastic leukemia, in remission: Principal | ICD-10-CM

## 2019-12-10 DIAGNOSIS — Z7682 Awaiting organ transplant status: Principal | ICD-10-CM

## 2019-12-17 ENCOUNTER — Encounter: Admit: 2019-12-17 | Discharge: 2019-12-18 | Payer: BLUE CROSS/BLUE SHIELD

## 2019-12-17 DIAGNOSIS — Z7682 Awaiting organ transplant status: Principal | ICD-10-CM

## 2019-12-21 ENCOUNTER — Ambulatory Visit: Admit: 2019-12-21 | Discharge: 2019-12-22 | Payer: BLUE CROSS/BLUE SHIELD

## 2019-12-21 DIAGNOSIS — C9201 Acute myeloblastic leukemia, in remission: Principal | ICD-10-CM

## 2019-12-23 ENCOUNTER — Encounter: Admit: 2019-12-23 | Discharge: 2019-12-23 | Payer: BLUE CROSS/BLUE SHIELD

## 2019-12-23 ENCOUNTER — Ambulatory Visit: Admit: 2019-12-23 | Discharge: 2019-12-23 | Payer: BLUE CROSS/BLUE SHIELD

## 2019-12-23 DIAGNOSIS — C9200 Acute myeloblastic leukemia, not having achieved remission: Principal | ICD-10-CM

## 2019-12-27 DIAGNOSIS — Z7682 Awaiting organ transplant status: Principal | ICD-10-CM

## 2019-12-31 ENCOUNTER — Encounter: Admit: 2019-12-31 | Discharge: 2020-01-01 | Payer: PRIVATE HEALTH INSURANCE

## 2020-01-05 ENCOUNTER — Encounter: Admit: 2020-01-05 | Discharge: 2020-01-06 | Payer: BLUE CROSS/BLUE SHIELD

## 2020-01-05 ENCOUNTER — Ambulatory Visit: Admit: 2020-01-05 | Discharge: 2020-01-06 | Payer: BLUE CROSS/BLUE SHIELD

## 2020-01-05 DIAGNOSIS — Z7682 Awaiting organ transplant status: Principal | ICD-10-CM

## 2020-01-05 DIAGNOSIS — C9201 Acute myeloblastic leukemia, in remission: Principal | ICD-10-CM

## 2020-01-06 ENCOUNTER — Encounter: Admit: 2020-01-06 | Discharge: 2020-01-06 | Payer: BLUE CROSS/BLUE SHIELD | Attending: Hematology | Primary: Hematology

## 2020-01-06 ENCOUNTER — Encounter: Admit: 2020-01-06 | Discharge: 2020-01-06 | Payer: BLUE CROSS/BLUE SHIELD

## 2020-01-06 DIAGNOSIS — C9200 Acute myeloblastic leukemia, not having achieved remission: Principal | ICD-10-CM

## 2020-01-11 DIAGNOSIS — Z7682 Awaiting organ transplant status: Principal | ICD-10-CM

## 2020-01-17 DIAGNOSIS — C9200 Acute myeloblastic leukemia, not having achieved remission: Principal | ICD-10-CM

## 2020-01-17 MED ORDER — PROCHLORPERAZINE MALEATE 10 MG TABLET
ORAL_TABLET | Freq: Four times a day (QID) | ORAL | 2 refills | 8.00000 days | Status: CP | PRN
Start: 2020-01-17 — End: ?

## 2020-01-18 DIAGNOSIS — Z7682 Awaiting organ transplant status: Principal | ICD-10-CM

## 2020-01-18 DIAGNOSIS — C9201 Acute myeloblastic leukemia, in remission: Principal | ICD-10-CM

## 2020-01-18 MED ORDER — LEVETIRACETAM 1,000 MG TABLET
ORAL_TABLET | Freq: Two times a day (BID) | ORAL | 0 refills | 3 days | Status: CP
Start: 2020-01-18 — End: 2020-01-21

## 2020-01-19 ENCOUNTER — Encounter: Admit: 2020-01-19 | Discharge: 2020-01-20 | Payer: BLUE CROSS/BLUE SHIELD

## 2020-01-19 ENCOUNTER — Ambulatory Visit
Admit: 2020-01-19 | Discharge: 2020-01-20 | Payer: BLUE CROSS/BLUE SHIELD | Attending: Pharmacist Clinician (PhC)/ Clinical Pharmacy Specialist | Primary: Pharmacist Clinician (PhC)/ Clinical Pharmacy Specialist

## 2020-01-19 ENCOUNTER — Ambulatory Visit: Admit: 2020-01-19 | Discharge: 2020-01-20 | Payer: BLUE CROSS/BLUE SHIELD

## 2020-01-19 ENCOUNTER — Encounter
Admit: 2020-01-19 | Discharge: 2020-01-20 | Payer: BLUE CROSS/BLUE SHIELD | Attending: Registered" | Primary: Registered"

## 2020-01-25 ENCOUNTER — Encounter
Admit: 2020-01-25 | Discharge: 2020-01-26 | Payer: BLUE CROSS/BLUE SHIELD | Attending: Physician Assistant | Primary: Physician Assistant

## 2020-01-26 ENCOUNTER — Encounter: Admit: 2020-01-26 | Discharge: 2020-01-26 | Payer: BLUE CROSS/BLUE SHIELD

## 2020-01-26 ENCOUNTER — Ambulatory Visit
Admit: 2020-01-26 | Discharge: 2020-02-23 | Disposition: A | Discharge status: Home / Self Care | Payer: BLUE CROSS/BLUE SHIELD | Admitting: Internal Medicine

## 2020-01-26 ENCOUNTER — Ambulatory Visit
Admit: 2020-01-26 | Discharge: 2020-02-23 | Disposition: A | Discharge status: Home / Self Care | Payer: PRIVATE HEALTH INSURANCE | Admitting: Internal Medicine

## 2020-01-26 DIAGNOSIS — Z7682 Awaiting organ transplant status: Principal | ICD-10-CM

## 2020-01-27 MED ORDER — LETERMOVIR 480 MG TABLET
ORAL_TABLET | Freq: Every day | ORAL | 0 refills | 100.00000 days
Start: 2020-01-27 — End: 2020-01-27

## 2020-01-27 MED ORDER — TACROLIMUS 0.5 MG CAPSULE, IMMEDIATE-RELEASE
ORAL_CAPSULE | Freq: Two times a day (BID) | ORAL | 0 refills | 30.00000 days
Start: 2020-01-27 — End: 2020-01-27

## 2020-02-03 ENCOUNTER — Ambulatory Visit: Admit: 2020-02-03 | Discharge: 2020-02-04 | Payer: PRIVATE HEALTH INSURANCE

## 2020-02-18 NOTE — Unmapped (Signed)
BMT-CT Inpatient Progress Note    Patient Name: John Erickson  MRN: [id number]  Encounter Date: 02/18/20    Referring physician:  Charlotta Newton, MD   BMT Attending MD: Lanae Boast, MD     Disease: AML  Current disease status: CR MRD Negative  Type of Transplant: MAC MMUD  Graft Source: Fresh PBSCs  Transplant Day: +16    HPI:  John Erickson is a 59 y.o. 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      Interval History: Afebrile, engrafting with ANC now up to 0.6  Mucositis pain slowly improving daily. Doing well with current PCA settings, these were reduced yesterday. He is taking in slightly more liquids, tried some soup last night and plans to have that again today. No diarrhea. Walking frequently in the halls. He is developing some LE swelling, but now that drinking better can decrease his iv fluids.     Review of Systems:  A comprehensive ROS performed and is negative except for pertinent positives as listed above in interval history.     Test Results:   I reviewed all labs from today in Epic. See EMR for lab results.    Scheduled Meds:  ??? amLODIPine benzoate  10 mg Oral Daily   ??? clonazePAM  0.5 mg Oral Daily   ??? clonazePAM  1 mg Oral Nightly   ??? famotidine (PEPCID) IV  20 mg Intravenous BID   ??? fluconazole (DIFLUCAN) INTRAVENOUS  400 mg Intravenous Q24H   ??? heparin, porcine (PF)  2 mL Intravenous Q MWF   ??? letermovir  480 mg Intravenous Q24H   ??? nicotine  1 patch Transdermal Q24H   ??? phytonadione (vitamin K1)  5 mg Oral Daily   ??? pregabalin  75 mg Oral BID   ??? tacrolimus  2.5 mg Oral BID   ??? tbo-filgrastim  480 mcg Subcutaneous Q24H   ??? ursodiol  300 mg Oral TID   ??? valACYclovir  500 mg Oral Daily     Continuous Infusions:  ??? HYDROmorphone in 0.9 % NaCL     ??? IP okay to treat     ??? sodium chloride 20 mL/hr (02/15/20 0402)     PRN Meds: acetaminophen, aluminum-magnesium hydroxide-simethicone, carboxymethylcellulose sodium, CETAPHIL, emollient combination no.92, hydrALAZINE, IP okay to treat, lidocaine 2% viscous, loperamide, loperamide, magic mouthwash oral, magnesium sulfate in water, melatonin, lidocaine-diphenhydramine-aluminum-magnesium, naloxone, potassium chloride in water, prochlorperazine **OR** prochlorperazine, traZODone    Physical Exam: mostly unchanged from prior exam on 02/16/20  BP 141/90  - Pulse 110  - Temp 37 ??C (98.6 ??F) (Oral)  - Resp 18  - Ht 171.2 cm (5' 7.4)  - Wt 84.4 kg (186 lb 1.6 oz)  - SpO2 98%  - BMI 28.80 kg/m??     General: No acute distress noted.   Central venous access: Line clean, dry, intact. No erythema or drainage noted.   ENT: Moist mucous membranes,  tongue smooth with thick secretions, healing mucosa to buccal areas and tongue, posterior throat remains with significant erythema.  Cardiovascular: Tachycardic, with regular rate and rhythm. S1 and S2 normal, without any murmur, rub, or gallop.  Lungs: Clear to auscultation bilaterally, without wheezes/crackles/rhonchi. Good air movement.   Skin: Warm, dry, intact. No rash noted.    Psychiatry: Alert and oriented to person, place, and time.   Gastrointestinal/Abdomen: Normoactive bowel sounds, abdomen soft, non-tender   Musculoskeletal/Extremities: FROM throughout. Trace to +1 edema  bilaterally around ankles up to mid shin.   Neurologic: CNII-XII intact. Normal strength and sensation throughout    Assessment/Plan:    BMT: AML, CR MRD Negative  HCT-CI (age adjusted) 5 (psych, severe pulmonary dysfunction, and age)   Conditioning: MAC Bu/Flu/ATG  Donor: 9/10, ABO O+, CMV+  Engraftment: Granix starting D+12 through engraftment (as defined as ANC 1.0 x 2 days or 3.0 x 1 day)  - Date of last granix injection: TBD  ??  GVHD prophylaxis:   1.Tacrolimus being managed by pharmacy with a goal of 5-10 ng/mL  2. Methotrexate??15 mg/m2 IVP on day +1 then 10 mg/m2 on days +3, +6 and +11  3. ATG per [facility] standard dosing will??be included  ??  Heme:   Transfusion criteria: 1 unit of PRBCs for Hgb<7 and 1 unit of platelets for Plt <10K or bleeding.   - No history of transfusion reactions.     Elevated INR (related to nutrition deficit)  - INR 1.7 on 9/30, started PO vit k 5 mg x 3 days    ID:   Prophylaxis:  - Antiviral: Valtrex 500 mg po daily   - Antifungal: Fluconazole 400 mg po daily, continue through day +75  - PJP: Dapsone (has Sulfa allergy) upon platelet engraftment.     ** G6PD normal, 8.3  - CMV D+/R+: Letermovir prophy, 480mg  po daily starting D+15 or on hospital discharge (whichever is sooner), continue through D+100   ??  Neutropenic Fever:  02/03/20: Tmax 38.4 Cefepime (9/16 - 9/22)  - Cultures NGx 5 days    02/13/20: Spiked again at 5am 9/26  - Cultures negative at 5 days  - Started on IV cefepime (9/26- 10/1) and IV flagyl (anaerobic coverage given mucositis) (9/26-10/1). Received a dose of IV vanc but no indication to continue.     Allergy:  - PCN allergy (hives) has tolerated Cefepime without issues  ??  EBV:  - IgM Ab + 01/05/20 but viral load 01/05/20 not detected.     GI:   GERD prophylaxis:  - Pepcid BID  ??  Nausea:  - Anti-emetics per BMT protocol.     Mucositis (chemotherapy related):  - Pain controlled with PCA as below.   - Ordering soft bland diet, meds to liquid or iv as able    Renal:   - AKI: Improved with IV fluids  ??  FEN:  PO intake improving and now developing some LE edema, will stop iv fluids and monitor po intake.       Electrolytes:  - Replacing mag and potassium per standing orders    Supplements: Held on admission  - B complex, Vitamin D  ??  Hepatic:   - No active issues.   - VOD prophylaxis with Actigall TID.     CV:    HTN: New, possibly tac related  - Amlodipine 5mg  daily, increased to 10 mg 9/28 for persistent hypertension.     Pulm: DLCO 62%  - Former smoker, quit 01/12/20. Using nicotine patch currently. Has chronic dry cough but PFTs were acceptable.   07/24/19: CT chest w/ 0.6cm RUL nodules, no clear etiology.     ** Discussed with Dr. Oswald Hillock, ok to move forward, no plans to repeat unless new findings.         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     Mucositis: (chemotherapy induced)   Grade 3,  Dilaudid PCA (AKI so Dilaudid chosen over Morphine)   -  9/23 started with 0.5 mg PCA dose q 15 min prn, titrate up as needed  - 9/27: Increased bolus to 0.75 mg q4min  -9/28: Decreased back to 0.5 dosing per patient request as sleeping too frequently  -9/30: Decrease to 0.25 for pt request with improvment     Psychosocial:   - Substance abuse (opioid history). Sober for 5 years.  - Followed by Frederik Schmidt in [facility], placed referral for [facility] while admitted, are not seeing inpatients, will follow- up with him post discharge.   - Insomnia: Melatonin 3mg  and Trazodone 50mg  nightly prn.   - Anxiety: Hospitalization related. Well controlled.     ** Klonopin 0.5/1mg  daily.   ??  Summary: Plan as above D+16  - Continue Dilaudid PCA, dose 0.25 mg q 15 min prn due to sedation  -  Stop iv fluids will keep KVO.  - No longer neutropenic and afebrile with negative cultures, stopping Cefepime and Flagyl today.   - Vitamin K x 3 days po for elevated INR.     Almira Bar Adult Nurse Practitioner  02/18/2020  12:08 PM

## 2020-02-19 NOTE — Unmapped (Signed)
BMT-CT Inpatient Progress Note    Patient Name: John Erickson  MRN: [id number]  Encounter Date: 02/19/20    Referring physician:  Charlotta Newton, MD   BMT Attending MD: Lanae Boast, MD     Disease: AML  Current disease status: CR MRD Negative  Type of Transplant: MAC MMUD  Graft Source: Fresh PBSCs  Transplant Day: +17    HPI:  John Erickson is a 59 y.o. 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      Interval History: John Erickson continue doing well overall. He did not get much sleep last night due to interruptions and plumber coming in early this morning to fix his sink. Mucositis pain has been very slowly improving daily. Doing well with current PCA settings, would like to keep the same today.  He is taking in slightly more liquids, mostly has been able to do water, tea and soup. No diarrhea, 1 BM today. Walking several laps daily in the halls. LE swelling is worse today, weight is up 3 lb. He is afebrile. His WBC is increasing, up to 2.1 today with ANC of 1.6. No transfusions required this morning. Potassium was replaced.     Review of Systems:  A comprehensive ROS performed and is negative except for pertinent positives as listed above in interval history.     Test Results:   I reviewed all labs from today in Epic. See EMR for lab results.    Scheduled Meds:  ??? amLODIPine benzoate  10 mg Oral Daily   ??? clonazePAM  0.5 mg Oral Daily   ??? clonazePAM  1 mg Oral Nightly   ??? famotidine  20 mg Oral BID   ??? fluconazole  400 mg Oral Daily   ??? heparin, porcine (PF)  2 mL Intravenous Q MWF   ??? letermovir  480 mg Intravenous Q24H   ??? nicotine  1 patch Transdermal Q24H   ??? phytonadione (vitamin K1)  5 mg Oral Daily   ??? pregabalin  75 mg Oral BID   ??? tacrolimus  2.5 mg Oral BID   ??? tbo-filgrastim  480 mcg Subcutaneous Q24H   ??? ursodiol  300 mg Oral TID   ??? valACYclovir  500 mg Oral Daily     Continuous Infusions:  ??? HYDROmorphone in 0.9 % NaCL Stopped (02/18/20 1855)   ??? IP okay to treat     ??? sodium chloride 20 mL/hr (02/15/20 0402)     PRN Meds: acetaminophen, aluminum-magnesium hydroxide-simethicone, carboxymethylcellulose sodium, CETAPHIL, emollient combination no.92, hydrALAZINE, IP okay to treat, lidocaine 2% viscous, loperamide, loperamide, magic mouthwash oral, magnesium sulfate in water, melatonin, lidocaine-diphenhydramine-aluminum-magnesium, naloxone, potassium chloride in water, prochlorperazine **OR** prochlorperazine, traZODone    Physical Exam: mostly unchanged from prior exam on 02/18/20  BP 118/87  - Pulse 119  - Temp 37.1 ??C (98.8 ??F) (Oral)  - Resp 18  - Ht 171.2 cm (5' 7.4)  - Wt 85.7 kg (189 lb)  - SpO2 99%  - BMI 29.25 kg/m??     General: No acute distress noted.   Central venous access: Line clean, dry, intact. No erythema or drainage noted.   ENT: Moist mucous membranes,  tongue smooth with thick secretions, healing mucosa to right and left buccal areas and tongue, posterior throat remains with significant erythema. Some scabs on lips, no bleeding.   Cardiovascular: Tachycardic, with regular rate and rhythm. S1 and S2 normal, without any murmur, rub, or  gallop.  Lungs: Clear to auscultation bilaterally, without wheezes/crackles/rhonchi. Good air movement.   Skin: Warm, dry, intact. No rash noted.    Psychiatry: Alert and oriented to person, place, and time.   Gastrointestinal/Abdomen: Normoactive bowel sounds, abdomen soft, non-tender   Musculoskeletal/Extremities: FROM throughout. +1 edema bilaterally around ankles up to mid shin.   Neurologic: CNII-XII intact. Normal strength and sensation throughout    Assessment/Plan:    BMT: AML, CR MRD Negative  HCT-CI (age adjusted) 5 (psych, severe pulmonary dysfunction, and age)   Conditioning: MAC Bu/Flu/ATG  Donor: 9/10, ABO O+, CMV+  Engraftment: Granix starting D+12 through engraftment (as defined as ANC 1.0 x 2 days or 3.0 x 1 day)  - Date of last granix injection: TBD  ??  GVHD prophylaxis:   1.Tacrolimus being managed by pharmacy with a goal of 5-10 ng/mL  2. Methotrexate??15 mg/m2 IVP on day +1 then 10 mg/m2 on days +3, +6 and +11  3. ATG per [facility] standard dosing will??be included  ??  Heme:   Transfusion criteria: 1 unit of PRBCs for Hgb<7 and 1 unit of platelets for Plt <10K or bleeding.   - No history of transfusion reactions.     Elevated INR (related to nutrition deficit)  - INR 1.7 on 9/30, started PO vit k 5 mg x 3 days    ID:   Prophylaxis:  - Antiviral: Valtrex 500 mg po daily   - Antifungal: Fluconazole 400 mg po daily, continue through day +75  - PJP: Dapsone (has Sulfa allergy) upon platelet engraftment.     ** G6PD normal, 8.3  - CMV D+/R+: Letermovir prophy, 480mg  po daily starting D+15 or on hospital discharge (whichever is sooner), continue through D+100   ??  Neutropenic Fever:  02/03/20: Tmax 38.4 Cefepime (9/16 - 9/22)  - Cultures NGx 5 days    02/13/20: Spiked again at 5am 9/26  - Cultures negative at 5 days  - Started on IV cefepime (9/26- 10/1) and IV flagyl (anaerobic coverage given mucositis) (9/26-10/1). Received a dose of IV vanc but no indication to continue.     Allergy:  - PCN allergy (hives) has tolerated Cefepime without issues  ??  EBV:  - IgM Ab + 01/05/20 but viral load 01/05/20 not detected.     GI:   GERD prophylaxis:  - Pepcid BID  ??  Nausea:  - Anti-emetics per BMT protocol.     Mucositis (chemotherapy related):  - Pain controlled with PCA as below.   - Ordering soft bland diet, meds to liquid or iv as able    Renal:   - AKI: Improved with IV fluids  ??  FEN:  PO intake improving and now developing some LE edema,  Minimizing iv fluids and monitor po intake.  - Lasix 20 mg iv once 10/2       Electrolytes:  - Replacing mag and potassium per standing orders    Supplements: Held on admission  - B complex, Vitamin D  ??  Hepatic:   - No active issues.   - VOD prophylaxis with Actigall TID.     CV:    HTN: New, possibly tac related  - Amlodipine 5mg  daily, increased to 10 mg 9/28 for persistent hypertension.     Pulm: DLCO 62%  - Former smoker, quit 01/12/20. Using nicotine patch currently. Has chronic dry cough but PFTs were acceptable.   07/24/19: CT chest w/ 0.6cm RUL nodules, no clear etiology.     ** Discussed  with Dr. Oswald Hillock, ok to move forward, no plans to repeat unless new findings.         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     Mucositis: (chemotherapy induced)   Grade 3,  Dilaudid PCA (AKI so Dilaudid chosen over Morphine)   - 9/23 started with 0.5 mg PCA dose q 15 min prn, titrate up as needed  - 9/27: Increased bolus to 0.75 mg q38min  -9/28: Decreased back to 0.5 dosing per patient request as sleeping too frequently  -9/30: Decrease to 0.25 for pt request with improvment     Psychosocial:   - Substance abuse (opioid history). Sober for 5 years.  - Followed by Frederik Schmidt in [facility], placed referral for [facility] while admitted, are not seeing inpatients, will follow- up with him post discharge.   - Insomnia: Melatonin 3mg  and Trazodone 50mg  nightly prn.   - Anxiety: Hospitalization related. Well controlled.     ** Klonopin 0.5/1mg  daily.   ??  Summary: Plan as above D+17  - Continue Dilaudid PCA, dose 0.25 mg q 15 min prn due to sedation  -  Lasix 20 mg iv once today  - No longer neutropenic and afebrile with negative cultures.  -  Continue Vitamin K x 3 days po for elevated INR, repeat INR on Monday.   - Last dose of Granix tonight.     Almira Bar Adult Nurse Practitioner  02/19/2020  11:30 AM

## 2020-02-20 NOTE — Unmapped (Signed)
BMT-CT Inpatient Progress Note    Patient Name: John Erickson  MRN: [id number]  Encounter Date: 02/20/20    Referring physician:  Charlotta Newton, MD   BMT Attending MD: Lanae Boast, MD     Disease: AML  Current disease status: CR MRD Negative  Type of Transplant: MAC MMUD  Graft Source: Fresh PBSCs  Transplant Day: +18    HPI:  John Erickson is a 59 y.o. 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      Interval History: John Erickson had a better sleeping night last nigt. Mucositis pain has been very slowly improving daily. Doing well with current PCA settings, would like to keep the same today and consider more oral meds tomorrow.  He is taking in liquids, mostly has been able to do water, tea and soup. Will try other soft foods today No diarrhea, 1 BM daily. Walking several laps daily in the halls. LE swelling improved after lasix yesterday, negative 2L and weight is down today. He is afebrile. His WBC is increasing, up to 4.9 today with ANC of 3.6. No transfusions required this morning. Potassium and Magnesium are being replaced.     Review of Systems:  A comprehensive ROS performed and is negative except for pertinent positives as listed above in interval history.     Test Results:   I reviewed all labs from today in Epic. See EMR for lab results.    Scheduled Meds:  ??? [START ON 02/21/2020] amLODIPine  10 mg Oral Daily   ??? clonazePAM  0.5 mg Oral Daily   ??? clonazePAM  1 mg Oral Nightly   ??? famotidine  20 mg Oral BID   ??? [START ON 02/21/2020] fluconazole  400 mg Oral Daily   ??? heparin, porcine (PF)  2 mL Intravenous Q MWF   ??? letermovir  480 mg Intravenous Q24H   ??? nicotine  1 patch Transdermal Q24H   ??? potassium chloride in water  20 mEq Intravenous Q2H   ??? pregabalin  75 mg Oral BID   ??? tacrolimus  2.5 mg Oral BID   ??? ursodioL  300 mg Oral TID   ??? [START ON 02/21/2020] valACYclovir  500 mg Oral Daily     Continuous Infusions:  ??? HYDROmorphone in 0.9 % NaCL Stopped (02/18/20 1855)   ??? IP okay to treat     ??? sodium chloride 20 mL/hr (02/15/20 0402)     PRN Meds: acetaminophen, aluminum-magnesium hydroxide-simethicone, carboxymethylcellulose sodium, CETAPHIL, emollient combination no.92, hydrALAZINE, IP okay to treat, lidocaine 2% viscous, loperamide, loperamide, magic mouthwash oral, magnesium sulfate in water, melatonin, lidocaine-diphenhydramine-aluminum-magnesium, naloxone, potassium chloride in water, prochlorperazine **OR** prochlorperazine, traZODone    Physical Exam: mostly unchanged from prior exam on 02/19/20  BP 129/81  - Pulse 108  - Temp 36.9 ??C (98.5 ??F) (Oral)  - Resp 18  - Ht 171.2 cm (5' 7.4)  - Wt 79.7 kg (175 lb 9.6 oz)  - SpO2 100%  - BMI 27.18 kg/m??     General: No acute distress noted.   Central venous access: Line clean, dry, intact. No erythema or drainage noted.   ENT: Moist mucous membranes, tongue smooth with thick secretions, healing mucosa to right and left buccal areas and tongue, posterior throat remains with improving erythema. Some scabs on lips, no bleeding.   Cardiovascular: Tachycardic, with regular rate and rhythm. S1 and S2 normal, without any murmur, rub, or gallop.  Lungs: Clear to auscultation bilaterally, without wheezes/crackles/rhonchi. Good air movement.   Skin: Warm, dry, intact. No rash noted.    Psychiatry: Alert and oriented to person, place, and time.   Gastrointestinal/Abdomen: Normoactive bowel sounds, abdomen soft, non-tender   Musculoskeletal/Extremities: FROM throughout. Trace to +1 edema bilaterally around ankles up to mid shin, improved from yesterday.   Neurologic: CNII-XII intact. Normal strength and sensation throughout    Assessment/Plan:    BMT: AML, CR MRD Negative  HCT-CI (age adjusted) 5 (psych, severe pulmonary dysfunction, and age)   Conditioning: MAC Bu/Flu/ATG  Donor: 9/10, ABO O+, CMV+  Engraftment: Granix starting D+12 through engraftment (as defined as ANC 1.0 x 2 days or 3.0 x 1 day)  - Date of last granix injection: TBD  ??  GVHD prophylaxis:   1.Tacrolimus being managed by pharmacy with a goal of 5-10 ng/mL  2. Methotrexate??15 mg/m2 IVP on day +1 then 10 mg/m2 on days +3, +6 and +11  3. ATG per [facility] standard dosing will??be included  ??  Heme:   Transfusion criteria: 1 unit of PRBCs for Hgb<7 and 1 unit of platelets for Plt <10K or bleeding.   - No history of transfusion reactions.     Elevated INR (related to nutrition deficit)  - INR 1.7 on 9/30, started PO vit k 5 mg x 3 days    ID:   Prophylaxis:  - Antiviral: Valtrex 500 mg po daily   - Antifungal: Fluconazole 400 mg po daily, continue through day +75  - PJP: Dapsone (has Sulfa allergy) upon platelet engraftment.     ** G6PD normal, 8.3  - CMV D+/R+: Letermovir prophy, 480mg  po daily starting D+15 or on hospital discharge (whichever is sooner), continue through D+100   ??  Neutropenic Fever:  02/03/20: Tmax 38.4 Cefepime (9/16 - 9/22)  - Cultures NGx 5 days    02/13/20: Spiked again at 5am 9/26  - Cultures negative at 5 days  - Started on IV cefepime (9/26- 10/1) and IV flagyl (anaerobic coverage given mucositis) (9/26-10/1). Received a dose of IV vanc but no indication to continue.     Allergy:  - PCN allergy (hives) has tolerated Cefepime without issues  ??  EBV:  - IgM Ab + 01/05/20 but viral load 01/05/20 not detected.     GI:   GERD prophylaxis:  - Pepcid BID  ??  Nausea:  - Anti-emetics per BMT protocol.     Mucositis (chemotherapy related):  - Pain controlled with PCA as below.   - Ordering soft bland diet, meds to liquid or iv as able    Renal:   - AKI: Improved with IV fluids  ??  FEN:  PO intake improving and now developing some LE edema,  Minimizing iv fluids and monitor po intake.  - Lasix 20 mg iv once 10/2     Electrolytes:  - Replacing mag and potassium per standing orders    Supplements: Held on admission  - B complex, Vitamin D  ??  Hepatic:   - No active issues.   - VOD prophylaxis with Actigall TID.     CV:    HTN: New, possibly tac related  - Amlodipine 5mg  daily, increased to 10 mg 9/28 for persistent hypertension.     Pulm: DLCO 62%  - Former smoker, quit 01/12/20. Using nicotine patch currently. Has chronic dry cough but PFTs were acceptable.   07/24/19: CT chest w/ 0.6cm RUL nodules, no clear etiology.     **  Discussed with Dr. Oswald Hillock, ok to move forward, no plans to repeat unless new findings.         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     Mucositis: (chemotherapy induced)   Grade 3,  Dilaudid PCA (AKI so Dilaudid chosen over Morphine)   - 9/23 started with 0.5 mg PCA dose q 15 min prn, titrate up as needed  - 9/27: Increased bolus to 0.75 mg q77min  -9/28: Decreased back to 0.5 dosing per patient request as sleeping too frequently  -9/30: Decrease to 0.25 for pt request with improvment     Psychosocial:   - Substance abuse (opioid history). Sober for 5 years.  - Followed by Frederik Schmidt in [facility], placed referral for [facility] while admitted, are not seeing inpatients, will follow- up with him post discharge.   - Insomnia: Melatonin 3mg  and Trazodone 50mg  nightly prn.   - Anxiety: Hospitalization related. Well controlled.     ** Klonopin 0.5/1mg  daily, at discharge will stop morning dose and keep evening dose.   ??  Summary: Plan as above D+18  - Continue Dilaudid PCA, dose 0.25 mg q 15 min prn, anticipate changing back to prn oxy as mucositis continues to heal in the next 1-2 days   - No longer neutropenic and afebrile with negative cultures.  -  Last dose of Vitamin K today for elevated INR, repeat INR on Monday.   - Replacing potassium, will receive total of 100 meq for 2.9  and magnesium 4 gm iv for 1.0.   - Will change some meds from liquid to oral tablets beginning tomorrow    Almira Bar Adult Nurse Practitioner  02/20/2020  10:20 AM

## 2020-02-21 NOTE — Unmapped (Signed)
BMT-CT Inpatient Progress Note    Patient Name: John Erickson  MRN: [id number]  Encounter Date: 02/21/20    Referring physician:  Charlotta Newton, MD   BMT Attending MD: John Boast, MD     Disease: AML  Current disease status: CR MRD Negative  Type of Transplant: MAC MMUD  Graft Source: Fresh PBSCs  Transplant Day: +19    HPI:  John Erickson is a 59 y.o. 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      Interval History: [name] continues to engraft WBC up 6.4 and ANC 4.4. Mucositis healing, pain is less. He was able to swallow the medications we changed to pills yesterday. He is eating a little better, soft foods. He is going to try some of the larger pills today. He feels pain is improved enough to switch to oral pain meds with prn iv if needed. Drinking fairly well, in yesterday. No diarrhea, one BM daily.  He has had some LE swelling so have been holding additional iv fluids. Requiring multiple electrolyte replacements today, K, Mag, Phos. Mild nausea, using Compazine as needed with results. He remains afebrile.     Review of Systems:  A comprehensive ROS performed and is negative except for pertinent positives as listed above in interval history.     Test Results:   I reviewed all labs from today in Epic. See EMR for lab results.    Scheduled Meds:  ??? amLODIPine  10 mg Oral Daily   ??? clonazePAM  0.5 mg Oral Daily   ??? clonazePAM  1 mg Oral Nightly   ??? famotidine  20 mg Oral BID   ??? fluconazole  400 mg Oral Daily   ??? heparin, porcine (PF)  2 mL Intravenous Q MWF   ??? letermovir  480 mg Intravenous Q24H   ??? nicotine  1 patch Transdermal Q24H   ??? potassium phosphate  30 mmol Intravenous Once   ??? pregabalin  75 mg Oral BID   ??? tacrolimus  2.5 mg Oral BID   ??? ursodioL  300 mg Oral TID   ??? valACYclovir  500 mg Oral Daily     Continuous Infusions:  ??? HYDROmorphone in 0.9 % NaCL     ??? IP okay to treat     ??? sodium chloride 20 mL/hr (02/15/20 0402)     PRN Meds: acetaminophen, aluminum-magnesium hydroxide-simethicone, carboxymethylcellulose sodium, CETAPHIL, emollient combination no.92, hydrALAZINE, IP okay to treat, lidocaine 2% viscous, loperamide, loperamide, magic mouthwash oral, magnesium sulfate in water, melatonin, lidocaine-diphenhydramine-aluminum-magnesium, naloxone, potassium chloride in water, prochlorperazine **OR** prochlorperazine, traZODone    Physical Exam: mostly unchanged from prior exam on 02/20/20  BP 122/77  - Pulse 96  - Temp 36.9 ??C (98.4 ??F) (Oral)  - Resp 18  - Ht 171.2 cm (5' 7.4)  - Wt 79.8 kg (176 lb)  - SpO2 98%  - BMI 27.24 kg/m??     General: No acute distress noted.   Central venous access: Line clean, dry, intact. No erythema or drainage noted.   ENT: Moist mucous membranes, tongue smooth with thick secretions, healing mucosa to right and left buccal areas and tongue, posterior throat now with only mild erythema. Some scabs on lips, no bleeding.   Cardiovascular: Tachycardic, with regular rate and rhythm. S1 and S2 normal, without any murmur, rub, or gallop.  Lungs: Clear to auscultation bilaterally, without wheezes/crackles/rhonchi. Good air movement.   Skin: Warm,  dry, intact. No rash noted.    Psychiatry: Alert and oriented to person, place, and time.   Gastrointestinal/Abdomen: Normoactive bowel sounds, abdomen soft, non-tender   Musculoskeletal/Extremities: FROM throughout. Trace to +1 edema bilaterally around ankles up to mid shin, improved from the weekend.   Neurologic: CNII-XII intact. Normal strength and sensation throughout    Assessment/Plan:    BMT: AML, CR MRD Negative  HCT-CI (age adjusted) 7 (psych, severe pulmonary dysfunction, and age)   Conditioning: MAC Bu/Flu/ATG  Donor: 9/10, ABO O+, CMV+  Engraftment: Granix starting D+12 through engraftment (as defined as ANC 1.0 x 2 days or 3.0 x 1 day)  - Date of last granix injection: TBD  ??  GVHD prophylaxis:   1.Tacrolimus being managed by pharmacy with a goal of 5-10 ng/mL  2. Methotrexate??15 mg/m2 IVP on day +1 then 10 mg/m2 on days +3, +6 and +11  3. ATG per [facility] standard dosing will??be included  ??  Heme:   Transfusion criteria: 1 unit of PRBCs for Hgb<7 and 1 unit of platelets for Plt <10K or bleeding.   - No history of transfusion reactions.     Elevated INR (related to nutrition deficit)  - INR 1.7 on 9/30, started PO vit k 5 mg x 3 days  - INR not obtained today, reordered with midnight labs    ID:   Prophylaxis:  - Antiviral: Valtrex 500 mg po daily   - Antifungal: Fluconazole 400 mg po daily, continue through day +75  - PJP: Dapsone (has Sulfa allergy) upon platelet engraftment.     ** G6PD normal, 8.3  - CMV D+/R+: Letermovir prophy, 480mg  po daily starting D+15 or on hospital discharge (whichever is sooner), continue through D+100   ??  Neutropenic Fever:  02/03/20: Tmax 38.4 Cefepime (9/16 - 9/22)  - Cultures NGx 5 days    02/13/20: Spiked again at 5am 9/26  - Cultures negative at 5 days  - Started on IV cefepime (9/26- 10/1) and IV flagyl (anaerobic coverage given mucositis) (9/26-10/1). Received a dose of IV vanc but no indication to continue.     Allergy:  - PCN allergy (hives) has tolerated Cefepime without issues  ??  EBV:  - IgM Ab + 01/05/20 but viral load 01/05/20 not detected.     GI:   GERD prophylaxis:  - Pepcid BID  ??  Nausea:  - Anti-emetics per BMT protocol.     Mucositis (chemotherapy related):  - Improved, stop PCA 10/4, will have oral oxy or prn iv dilaudid    Renal:   - AKI: Improved with IV fluids, has not required any additional  ??  FEN:  PO intake improving and now developing some LE edema,  Minimizing iv fluids and monitor po intake.  - Lasix 20 mg iv once 10/2     Electrolytes:  - Replacing mag and potassium per standing orders  - 30 mmol of K phos given 10/4, repeat level ordered tomorrow    Supplements: Held on admission  - B complex, Vitamin D  ??  Hepatic:   - No active issues.   - VOD prophylaxis with Actigall TID. CV:    HTN: New, possibly tac related  - Amlodipine 5mg  daily, increased to 10 mg 9/28 for persistent hypertension.     Pulm: DLCO 62%  - Former smoker, quit 01/12/20. Using nicotine patch currently. Has chronic dry cough but PFTs were acceptable.   07/24/19: CT chest w/ 0.6cm RUL nodules, no clear etiology.     **  Discussed with Dr. Oswald Hillock, ok to move forward, no plans to repeat unless new findings.         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     Mucositis: (chemotherapy induced)   Grade 3,  Dilaudid PCA (AKI so Dilaudid chosen over Morphine)   - 9/23 started with 0.5 mg PCA dose q 15 min prn, titrate up as needed  - 9/27: Increased bolus to 0.75 mg q58min  -9/28: Decreased back to 0.5 dosing per patient request as sleeping too frequently  -9/30: Decrease to 0.25 for pt request with improvment   -10/4: Stopped PCA, will transition back to oral oxy 10 mg po q 4 hr prn, will also have iv prn dilaudid for severe pain    Psychosocial:   - Substance abuse (opioid history). Sober for 5 years.  - Followed by Frederik Schmidt in [facility], placed referral for [facility] while admitted, are not seeing inpatients, will follow- up with him post discharge.   - Insomnia: Melatonin 3mg  and Trazodone 50mg  nightly prn.   - Anxiety: Hospitalization related. Well controlled.     ** Klonopin 0.5/1mg  daily, at discharge will stop morning dose and keep evening dose.   ??  Summary: Plan as above D+19  - Mucositis improving, stop PCA today, oxy available prn, dilaudid iv for severe pain if needed   - No longer neutropenic and afebrile with negative cultures.  - Replacing potassium, mag, and phos today.   - Will change remaining meds from iv to oral tablets beginning tomorrow  - Anticipate discharge on Wednesday    Almira Bar Adult Nurse Practitioner  02/21/2020  9:29 AM

## 2020-02-22 DIAGNOSIS — Z9484 Stem cells transplant status: Principal | ICD-10-CM

## 2020-02-22 LAB — CBC W/ AUTO DIFF
BASOPHILS ABSOLUTE COUNT: 0 10*9/L (ref 0.0–0.1)
BASOPHILS RELATIVE PERCENT: 1.3 %
EOSINOPHILS ABSOLUTE COUNT: 0 10*9/L (ref 0.0–0.4)
EOSINOPHILS RELATIVE PERCENT: 0.3 %
LARGE UNSTAINED CELLS: 17 % — ABNORMAL HIGH (ref 0–4)
LYMPHOCYTES ABSOLUTE COUNT: 0.1 10*9/L — ABNORMAL LOW (ref 1.5–5.0)
LYMPHOCYTES RELATIVE PERCENT: 3.1 %
MEAN CORPUSCULAR HEMOGLOBIN CONC: 33.8 g/dL (ref 31.0–37.0)
MEAN CORPUSCULAR HEMOGLOBIN: 37.5 pg — ABNORMAL HIGH (ref 26.0–34.0)
MEAN CORPUSCULAR VOLUME: 111 fL — ABNORMAL HIGH (ref 80.0–100.0)
MEAN PLATELET VOLUME: 13.8 fL — ABNORMAL HIGH (ref 7.0–10.0)
MONOCYTES ABSOLUTE COUNT: 0.6 10*9/L (ref 0.2–0.8)
MONOCYTES RELATIVE PERCENT: 19.4 %
NEUTROPHILS ABSOLUTE COUNT: 1.8 10*9/L — ABNORMAL LOW (ref 2.0–7.5)
NEUTROPHILS RELATIVE PERCENT: 59.3 %
PLATELET COUNT: 22 10*9/L — ABNORMAL LOW (ref 150–440)
RED BLOOD CELL COUNT: 2.01 10*12/L — ABNORMAL LOW (ref 4.50–5.90)
RED CELL DISTRIBUTION WIDTH: 19.3 % — ABNORMAL HIGH (ref 12.0–15.0)
WBC ADJUSTED: 3.1 10*9/L — ABNORMAL LOW (ref 4.5–11.0)

## 2020-02-22 LAB — BASIC METABOLIC PANEL
ANION GAP: 5 mmol/L (ref 5–14)
BLOOD UREA NITROGEN: 6 mg/dL — ABNORMAL LOW (ref 9–23)
BUN / CREAT RATIO: 8
CALCIUM: 8.1 mg/dL — ABNORMAL LOW (ref 8.7–10.4)
CREATININE: 0.73 mg/dL
EGFR CKD-EPI AA MALE: >90 mL/min/{1.73_m2} (ref >=60)
EGFR CKD-EPI NON-AA MALE: >90 mL/min/{1.73_m2} (ref >=60)
GLUCOSE RANDOM: 127 mg/dL (ref 70–179)
POTASSIUM: 3.7 mmol/L (ref 3.4–4.5)

## 2020-02-22 LAB — MAGNESIUM
Magnesium:MCnc:Pt:Ser/Plas:Qn:: 1.3 — ABNORMAL LOW
Magnesium:MCnc:Pt:Ser/Plas:Qn:: 1.6

## 2020-02-22 LAB — CMV DNA, QUANTITATIVE, PCR: CMV QUANT: <50 [IU]/mL — ABNORMAL HIGH (ref <0)

## 2020-02-22 LAB — PROTIME-INR: INR: 1.21

## 2020-02-22 LAB — MACROCYTES

## 2020-02-22 LAB — PHOSPHORUS: Phosphate:MCnc:Pt:Ser/Plas:Qn:: 3.2

## 2020-02-22 LAB — EGFR CKD-EPI AA MALE: Glomerular filtration rate/1.73 sq M.predicted.black:ArVRat:Pt:Ser/Plas/Bld:Qn:Creatinine-based formula (CKD-EPI): >90

## 2020-02-22 LAB — PROTIME: Coagulation tissue factor induced:Time:Pt:PPP:Qn:Coag: 14.1 — ABNORMAL HIGH

## 2020-02-22 LAB — EBV VIRAL LOAD RESULT: Lab: NOT DETECTED

## 2020-02-22 LAB — CMV VIRAL LD: Lab: DETECTED — AB

## 2020-02-22 MED ORDER — URSODIOL 300 MG CAPSULE
ORAL_CAPSULE | Freq: Three times a day (TID) | ORAL | 11 refills | 30 days | Status: CP
Start: 2020-02-22 — End: ?
  Filled 2020-02-23: qty 90, 30d supply, fill #0

## 2020-02-22 MED ORDER — TACROLIMUS 0.5 MG CAPSULE, IMMEDIATE-RELEASE: 3 mg | capsule | Freq: Two times a day (BID) | 11 refills | 30 days | Status: AC

## 2020-02-22 MED ORDER — LETERMOVIR 480 MG TABLET: 480 mg | tablet | Freq: Every day | 2 refills | 28 days | Status: AC

## 2020-02-22 MED ORDER — TACROLIMUS 0.5 MG CAPSULE, IMMEDIATE-RELEASE
ORAL_CAPSULE | Freq: Two times a day (BID) | ORAL | 11 refills | 30.00000 days | Status: CP
Start: 2020-02-22 — End: 2020-02-22
  Filled 2020-02-23: qty 300, 30d supply, fill #0

## 2020-02-22 MED ORDER — OXYCODONE 10 MG TABLET
ORAL_TABLET | ORAL | 0 refills | 7.00000 days | Status: CP | PRN
Start: 2020-02-22 — End: 2020-02-29
  Filled 2020-02-23: qty 42, 7d supply, fill #0

## 2020-02-22 MED ORDER — TACROLIMUS 0.5 MG CAPSULE, IMMEDIATE-RELEASE: 3 mg | capsule | Freq: Two times a day (BID) | 5 refills | 30 days | Status: AC

## 2020-02-22 MED ORDER — CLONAZEPAM 1 MG TABLET
ORAL_TABLET | Freq: Every evening | ORAL | 0 refills | 7.00000 days | Status: CP
Start: 2020-02-22 — End: ?
  Filled 2020-02-23: qty 7, 7d supply, fill #0

## 2020-02-22 MED ORDER — MG-PLUS-PROTEIN 133 MG TABLET
ORAL_TABLET | Freq: Two times a day (BID) | ORAL | 3 refills | 30.00000 days | Status: CP
Start: 2020-02-22 — End: ?
  Filled 2020-02-23: qty 100, 25d supply, fill #0

## 2020-02-22 MED ORDER — PREGABALIN 75 MG CAPSULE
ORAL_CAPSULE | Freq: Two times a day (BID) | ORAL | 0 refills | 30.00000 days | Status: CP
Start: 2020-02-22 — End: ?
  Filled 2020-02-23: qty 60, 30d supply, fill #0

## 2020-02-22 MED ORDER — TRAZODONE 50 MG TABLET
ORAL_TABLET | Freq: Every evening | ORAL | 3 refills | 90 days | Status: CP | PRN
Start: 2020-02-22 — End: ?
  Filled 2020-02-23: qty 90, 90d supply, fill #0

## 2020-02-22 MED ORDER — VALACYCLOVIR 500 MG TABLET
ORAL_TABLET | Freq: Every day | ORAL | 11 refills | 30.00000 days | Status: CP
Start: 2020-02-22 — End: ?
  Filled 2020-02-23: qty 30, 30d supply, fill #0

## 2020-02-22 MED ORDER — FAMOTIDINE 20 MG TABLET
ORAL_TABLET | Freq: Two times a day (BID) | ORAL | 2 refills | 30 days | Status: CP
Start: 2020-02-22 — End: ?
  Filled 2020-02-23: qty 60, 30d supply, fill #0

## 2020-02-22 MED ADMIN — clonazePAM (KlonoPIN) tablet 0.5 mg: .5 mg | ORAL | @ 13:00:00

## 2020-02-22 MED ADMIN — prochlorperazine (COMPAZINE) tablet 10 mg: 10 mg | ORAL | @ 10:00:00

## 2020-02-22 MED ADMIN — valACYclovir (VALTREX) tablet 500 mg: 500 mg | ORAL | @ 13:00:00

## 2020-02-22 MED ADMIN — oxyCODONE (ROXICODONE) immediate release tablet 10 mg: 10 mg | ORAL | @ 15:00:00 | Stop: 2020-03-06

## 2020-02-22 MED ADMIN — letermovir (PREVYMIS) tablet 480 mg: 480 mg | ORAL | @ 13:00:00

## 2020-02-22 MED ADMIN — ursodioL (ACTIGALL) capsule 300 mg: 300 mg | ORAL | @ 13:00:00

## 2020-02-22 MED ADMIN — tacrolimus (PROGRAF) 2.5mg combo product: 2.5 mg | ORAL | @ 12:00:00

## 2020-02-22 MED ADMIN — magnesium sulfate in water 2 gram/50 mL (4 %) IVPB 2 g: 2 g | INTRAVENOUS | @ 10:00:00

## 2020-02-22 MED ADMIN — oxyCODONE (ROXICODONE) immediate release tablet 10 mg: 10 mg | ORAL | @ 10:00:00 | Stop: 2020-03-06

## 2020-02-22 MED ADMIN — famotidine (PEPCID) tablet 20 mg: 20 mg | ORAL | @ 13:00:00

## 2020-02-22 MED ADMIN — nicotine (NICODERM CQ) 21 mg/24 hr patch 1 patch: 1 | TRANSDERMAL | @ 15:00:00

## 2020-02-22 NOTE — Unmapped (Addendum)
John Erickson, 59 y.o., male, allo SCT, day +20-here for AML. This patient is alert, oriented, stable and afebrile. Issues/events for today include mucositis pain and nausea. Compazine and oxy have been helping with these issues. Adequate fluid intake. Ambulates frequently. Completed CVAD teaching with fiance. Received discharge supplies. Pt and fiance have received the discharge test and are currently working on it.  No other acute or significant events today. WCTM.       Vitals:    02/22/20 0746   BP: 124/87   Pulse: 99   Resp: 16   Temp: 36.8 ??C (98.2 ??F)   SpO2: 98%      Problem: Adult Inpatient Plan of Care  Goal: Plan of Care Review  Outcome: Progressing  Goal: Patient-Specific Goal (Individualized)  Outcome: Progressing  Goal: Absence of Hospital-Acquired Illness or Injury  Outcome: Progressing  Intervention: Identify and Manage Fall Risk  Recent Flowsheet Documentation  Taken 02/22/2020 0730 by Sarina Ser, RN  Safety Interventions:  ??? bleeding precautions  ??? low bed  ??? neutropenic precautions  Goal: Optimal Comfort and Wellbeing  Outcome: Progressing  Goal: Readiness for Transition of Care  Outcome: Progressing  Goal: Rounds/Family Conference  Outcome: Progressing     Problem: Infection  Goal: Absence of Infection Signs and Symptoms  Outcome: Progressing  Intervention: Prevent or Manage Infection  Recent Flowsheet Documentation  Taken 02/22/2020 0730 by Sarina Ser, RN  Isolation Precautions: protective precautions maintained     Problem: Anemia (Chemotherapy Effects)  Goal: Anemia Symptom Improvement  Outcome: Progressing  Intervention: Monitor and Manage Anemia  Recent Flowsheet Documentation  Taken 02/22/2020 0730 by Sarina Ser, RN  Safety Interventions:  ??? bleeding precautions  ??? low bed  ??? neutropenic precautions     Problem: Urinary Bleeding Risk or Actual (Chemotherapy Effects)  Goal: Absence of Hematuria  Outcome: Progressing     Problem: Nausea and Vomiting (Chemotherapy Effects)  Goal: Fluid and Electrolyte Balance  Outcome: Progressing     Problem: Neurotoxicity (Chemotherapy Effects)  Goal: Neurotoxicity Symptom Control  Outcome: Progressing     Problem: Neutropenia (Chemotherapy Effects)  Goal: Absence of Infection  Outcome: Progressing     Problem: Oral Mucositis (Chemotherapy Effects)  Goal: Improved Oral Mucous Membrane Integrity  Outcome: Progressing     Problem: Thrombocytopenia Bleeding Risk (Chemotherapy Effects)  Goal: Absence of Bleeding  Outcome: Progressing

## 2020-02-22 NOTE — Unmapped (Addendum)
[facility]   Patient Onboarding/Medication Counseling    John Erickson is a 59 y.o. male with History of allogeneic stem cell transplant who I am counseling today on initiation of therapy.  I am speaking to the patient.    Was a Nurse, learning disability used for this call? No    Verified patient's date of birth / HIPAA.    Specialty medication(s) to be sent: Infectious Disease: Prevymis and Transplant: tacrolimus 0.5mg     Non-specialty medications/supplies to be sent: none    Medications not needed at this time: none     Prograf (tacrolimus)    Medication & Administration     Dosage: Take 5 capsules (2.5 mg total) by mouth two (2) times a day.     Administration:   ??? May take with or without food  ??? Take 12 hours apart    Adherence/Missed dose instructions:  ??? Take a missed dose as soon as you think about it.  ??? If it is close to the time for your next dose, skip the missed dose and go back to your normal time.  ??? Do not take 2 doses at the same time or extra doses.    Goals of Therapy     ??? To prevent organ rejection    Side Effects & Monitoring Parameters     ??? Common side effects  ??? Dizziness  ??? Fatigue  ??? Headache  ??? Stuffy nose or sore throat  ??? Nausea, vomiting, stomach pain, diarrhea, constipation  ??? Heartburn  ??? Back or joint pain  ??? Increased risk of infection    ??? The following side effects should be reported to the provider:  ??? Allergic reaction  ??? Kidney issues (change in quantity or urine passed, blood in urine, or weight gain)  ??? High blood pressure (dizziness, change in eyesight, headache)  ??? Electrolyte issues (change in mood, confusion, muscle pain, or weakness)  ??? Abnormal breathing  ??? Shakiness  ??? Unexplained bleeding or bruising (gums bleeding, blood in urine, nosebleeds, any abnormal bleeding)  ??? Signs of infection (fever, cough, wounds that will not heal)  ??? Skin changes (sores, paleness, new or changed bumps or moles)    ??? Monitoring Parameters  ??? Renal function  ??? Liver function  ??? Glucose levels  ??? Blood pressure  ??? Tacrolimus trough levels  ??? Cardiac monitoring (for QT prolongation)      Contraindications, Warnings, & Precautions     ??? Black Box Warning: Infections - immunosuppressant agents increase the risk of infection that may lead to hospitalization or death  ??? Black Box Warning: Malignancy - immunosuppressant agents may be associated with the development of malignancies that may lead to hospitalization or death  ??? Limit or avoid sun and ultraviolet light exposure, use appropriate sun protection  ??? Myocardial hypertrophy -avoid use in patients with congenital long QT syndrome  ??? Diabetes mellitus - the risk for new-onset diabetes and insulin-dependent post-transplant diabetes mellitus is increased with tacrolimus use after transplantation  ??? GI perforation  ??? Hyperkalemia  ??? Hypertension  ??? Nephrotoxicity  ??? Neurotoxicity  ??? This is a narrow therapeutic index drug. Do not switch manufacturers without first talking to the provider.    Drug/Food Interactions     ??? Medication list reviewed in Epic. The patient was instructed to inform the care team before taking any new medications or supplements. 1. Fluconazole may increase the serum concentration of Tacrolimus (Systemic - usually with higher doses of fluconazole -  patient being monitored. 2.Amlodipine - Calcium Channel Blockers (Dihydropyridine) and CYP3A4 inhibitors like letermovir may increase the serum concentration of Tacrolimus - patient being monitored routinely.   ??? Avoid alcohol  ??? Avoid grapefruit or grapefruit juice  ??? Avoid live vaccines    Storage, Handling Precautions, & Disposal     ??? Store at room temperature  ??? Keep away from children and pets      The patient declined counseling on medication administration, missed dose instructions, goals of therapy, side effects and monitoring parameters, warnings and precautions, drug/food interactions and storage, handling precautions, and disposal because they were counseled in clinic. The information in the declined sections below are for informational purposes only and was not discussed with patient.     Prevymis (Letermovir)    Medication & Administration     Dosage: Take 1 tablets (480 mg) daily    Administration:   ??? Take with or without food  ??? Swallow tablet whole    Adherence/Missed dose instructions:  ??? Take a missed dose as soon as you think about it  ??? If it is close to the time for your next dose, skip the missed dose and go back to scheduled time    Goals of Therapy     ??? Prevent cytomegalovirus (CMV) infection in adult recipients of stem cell transplant    Side Effects & Monitoring Parameters     ??? Common side effects  ??? Diarrhea, nausea  ??? Cough  ??? Headache  ??? Loss of strength or energy  ??? Abdominal pain  ??? Vomiting    ??? The following side effects should be reported to the provider:  ??? Allergic reaction  ??? Swelling of arms or legs    ??? Monitoring Parameters:  ??? Monitor serum creatinine    Contraindications, Warnings, & Precautions     ??? Not recommended in patients with severe hepatic impairment (Child-Pugh class C)  ??? Use caution and closely monitor serum creatinine in patients with CrCl <50 ml/min    Drug/Food Interactions     ??? Medication list reviewed in Epic. The patient was instructed to inform the care team before taking any new medications or supplements. Letermovir - CYP3A4 Inhibitors (Moderate) may increase the serum concentration of AmLODIPine, trazodone and tacrolimus = pt being monitored routinely.       Storage, Handling Precautions, & Disposal     ??? Store at room temperature  ??? Keep away from children and pets    Current Medications (including OTC/herbals), Comorbidities and Allergies     No current facility-administered medications for this visit.     Current Outpatient Medications   Medication Sig Dispense Refill   ??? [START ON 02/23/2020] amLODIPine (NORVASC) 10 MG tablet Take 1 tablet (10 mg total) by mouth daily. 90 tablet 3   ??? clonazePAM (KLONOPIN) 1 MG tablet Take 1 tablet (1 mg total) by mouth nightly. 7 tablet 0   ??? famotidine (PEPCID) 20 MG tablet Take 1 tablet (20 mg total) by mouth Two (2) times a day. 60 tablet 2   ??? [START ON 02/23/2020] fluconazole (DIFLUCAN) 200 MG tablet Take 2 tablets (400 mg total) by mouth daily. 60 tablet 2   ??? [START ON 02/23/2020] letermovir (PREVYMIS) 480 mg tablet Take 1 tablet (480 mg total) by mouth daily. 28 tablet 2   ??? magnesium oxide-Mg AA chelate (MAGNESIUM, AMINO ACID CHELATE,) 133 mg Tab Take 2 tablets by mouth two (2) times a day. 120 tablet 3   ???  oxyCODONE (ROXICODONE) 10 mg immediate release tablet Take 1 tablet (10 mg total) by mouth every four (4) hours as needed for up to 7 days. 42 tablet 0   ??? pregabalin (LYRICA) 75 MG capsule Take 1 capsule (75 mg total) by mouth Two (2) times a day. 60 capsule 0   ??? tacrolimus (PROGRAF) 0.5 MG capsule Take 5 capsules (2.5 mg total) by mouth two (2) times a day. 300 capsule 5   ??? traZODone (DESYREL) 50 MG tablet Take 1 tablet (50 mg total) by mouth nightly as needed for sleep (insomnia). 90 tablet 3   ??? ursodioL (ACTIGALL) 300 mg capsule Take 1 capsule (300 mg total) by mouth Three (3) times a day. 90 capsule 11   ??? valACYclovir (VALTREX) 500 MG tablet Take 1 tablet (500 mg total) by mouth daily. 30 tablet 11     Facility-Administered Medications Ordered in Other Visits   Medication Dose Route Frequency Provider Last Rate Last Admin   ??? acetaminophen (TYLENOL) tablet 650 mg  650 mg Oral Q4H PRN Gerlene Burdock, AGNP   650 mg at 02/13/20 0912   ??? aluminum-magnesium hydroxide-simethicone (MAALOX MAX) 80-80-8 mg/mL oral suspension  30 mL Oral Q4H PRN Tomasa Hosteller, PA   30 mL at 02/16/20 0854   ??? amLODIPine (NORVASC) tablet 10 mg  10 mg Oral Daily Lou Miner Zanter, ANP   10 mg at 02/22/20 0830   ??? carboxymethylcellulose sodium (THERATEARS) 0.25 % ophthalmic solution 1 drop  1 drop Both Eyes 4x Daily PRN Gerlene Burdock, AGNP   1 drop at 02/16/20 2111 ??? CETAPHIL topical cleanser 1 application  1 application Topical 4x Daily PRN Tomasa Hosteller, PA       ??? clonazePAM (KlonoPIN) tablet 0.5 mg  0.5 mg Oral Daily Faith Elie Confer, PA   0.5 mg at 02/22/20 0830   ??? clonazePAM (KlonoPIN) tablet 1 mg  1 mg Oral Nightly Faith Elie Confer, [place]   1 mg at 02/21/20 2059   ??? emollient combination no.92 (LUBRIDERM) lotion 1 application  1 application Topical 4x Daily PRN Faith Elie Confer, PA       ??? famotidine (PEPCID) tablet 20 mg  20 mg Oral BID Lou Miner Zanter, ANP   20 mg at 02/22/20 0830   ??? fluconazole (DIFLUCAN) tablet 400 mg  400 mg Oral Daily Lou Miner Zanter, ANP   400 mg at 02/22/20 0831   ??? heparin, porcine (PF) 100 unit/mL injection 2 mL  2 mL Intravenous Q MWF Faith Elie Confer, PA   2 mL at 02/21/20 0839   ??? hydrALAZINE (APRESOLINE) injection 10 mg  10 mg Intravenous Q6H PRN Tomasa Hosteller, PA   10 mg at 02/14/20 0031   ??? oxyCODONE (ROXICODONE) immediate release tablet 10 mg  10 mg Oral Q4H PRN Lou Miner Zanter, ANP   10 mg at 02/22/20 1520    Or   ??? HYDROmorphone (PF) (DILAUDID) injection 0.5 mg  0.5 mg Intravenous Q4H PRN Lou Miner Zanter, ANP       ??? IP OKAY TO TREAT   Other Continuous PRN Libby Maw, ANP       ??? letermovir (PREVYMIS) tablet 480 mg  480 mg Oral Daily Lou Miner Zanter, ANP   480 mg at 02/22/20 0830   ??? lidocaine (XYLOCAINE) 2% viscous mucosal solution  10 mL Mouth Q2H PRN Tomasa Hosteller, PA   10 mL at 02/16/20 0854   ??? loperamide (IMODIUM) capsule  2 mg  2 mg Oral Q3H PRN Faith Elie Confer, PA       ??? loperamide (IMODIUM) capsule 4 mg  4 mg Oral Once PRN Megan Royall McElfresh, PA       ??? magic mouthwash oral suspension 10 mL  10 mL Oral Q2H PRN Faith Elie Confer, PA       ??? magnesium sulfate in water 2 gram/50 mL (4 %) IVPB 2 g  2 g Intravenous Q2H PRN Libby Maw, ANP   Stopped at 02/22/20 0801   ??? melatonin tablet 3 mg  3 mg Oral Nightly PRN Faith Elie Confer, PA       ??? mucositis mixture (with lidocaine)  10 mL Mucous Membrane Q2H PRN Tomasa Hosteller, PA   10 mL at 02/14/20 1610   ??? nicotine (NICODERM CQ) 21 mg/24 hr patch 1 patch  1 patch Transdermal Q24H Mitzi Hansen, AGNP   1 patch at 02/22/20 1038   ??? potassium chloride (KLOR-CON) CR tablet 20 mEq  20 mEq Oral BID Clide Deutscher Spruill, ANP   20 mEq at 02/22/20 1039   ??? potassium chloride 20 mEq in 100 mL IVPB Premix  20 mEq Intravenous Q1H PRN Mitzi Hansen, AGNP   Stopped at 02/21/20 9604   ??? pregabalin (LYRICA) capsule 75 mg  75 mg Oral BID Mitzi Hansen, AGNP   75 mg at 02/22/20 5409   ??? prochlorperazine (COMPAZINE) tablet 10 mg  10 mg Oral Q6H PRN Tomasa Hosteller, PA   10 mg at 02/22/20 8119    Or   ??? prochlorperazine (COMPAZINE) injection 10 mg  10 mg Intravenous Q6H PRN Tomasa Hosteller, PA   10 mg at 02/19/20 2102   ??? sodium chloride (NS) 0.9 % infusion  20 mL/hr Intravenous Continuous Tomasa Hosteller, PA 20 mL/hr at 02/15/20 0402 20 mL/hr at 02/15/20 0402   ??? tacrolimus (PROGRAF) 2.5mg  combo product  2.5 mg Oral BID Robinette Haines, MD   2.5 mg at 02/22/20 0829   ??? traZODone (DESYREL) tablet 50 mg  50 mg Oral Nightly PRN Mitzi Hansen, AGNP       ??? ursodioL (ACTIGALL) capsule 300 mg  300 mg Oral TID Lou Miner Zanter, ANP   300 mg at 02/22/20 1313   ??? valACYclovir (VALTREX) tablet 500 mg  500 mg Oral Daily Lou Miner Zanter, ANP   500 mg at 02/22/20 0830       Allergies   Allergen Reactions   ??? Penicillins Hives   ??? Sulfa (Sulfonamide Antibiotics) Other (See Comments) and Rash     Unknown    Other reaction(s): Unknown       Patient Active Problem List   Diagnosis   ??? Pancytopenia (CMS-HCC)   ??? Tobacco use disorder   ??? History of opioid abuse (CMS-HCC)   ??? AML (acute myeloid leukemia) (CMS-HCC)   ??? Anxiety   ??? Depression       Reviewed and up to date in Epic.    Appropriateness of Therapy     Is medication and dose appropriate based on diagnosis? Yes    Prescription has been clinically reviewed: Yes    Baseline Quality of Life Assessment      How many days over the past month did your history of allogeneic stem cell transplant  keep you from your normal activities? For example, brushing your teeth or getting up in the morning. Patient being discharged after stem cell transplant  Financial Information     Medication Assistance provided: Prior Authorization    Anticipated copay of $10 / 30 days (tac - no PA required) [place] Pending $0 - insurance, copay card, grant reviewed with patient. Verified delivery address.    Delivery Information     Scheduled delivery date: 02/25/20    Expected start date: 02/25/20    Medication will be delivered via Same Day Courier to the prescription address in [facility].  This shipment will not require a signature.      Explained the services we provide at [facility] and that each month we would call to set up refills.  Stressed importance of returning phone calls so that we could ensure they receive their medications in time each month.  Informed patient that we should be setting up refills 7-10 days prior to when they will run out of medication.  A pharmacist will reach out to perform a clinical assessment periodically.  Informed patient that a welcome packet and a drug information handout will be sent.      Patient verbalized understanding of the above information as well as how to contact the pharmacy at [phone] option 4 with any questions/concerns.  The pharmacy is open Monday through Friday 8:30am-4:30pm.  A pharmacist is available 24/7 via pager to answer any clinical questions they may have.    Patient Specific Needs     - Does the patient have any physical, cognitive, or cultural barriers? No    - Patient prefers to have medications discussed with  Patient     - Is the patient or caregiver able to read and understand education materials at a high school level or above? Yes    - Patient's primary language is  English     - Is the patient high risk? No    - Does the patient require a Care Management Plan? No     - Does the patient require physician intervention or other additional services (i.e. nutrition, smoking cessation, social work)? No      Roopal Vangie Bicker, PharmD  [facility]

## 2020-02-22 NOTE — Unmapped (Signed)
BMT-CT Inpatient Progress Note    Patient Name: John Erickson  MRN: [id number]  Encounter Date: 02/22/20    Referring physician:  Charlotta Newton, MD   BMT Attending MD: Lanae Boast, MD     Disease: AML  Current disease status: CR MRD Negative  Type of Transplant: MAC MMUD  Graft Source: Fresh PBSCs  Transplant Day: +20    HPI:  John Erickson is a 60 y.o. 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      Interval History: Mr. Dearden's counts are down slightly from yesterday but most likely from withdrawal of granix. Afebrile. Mucositis resolving. He is back on all oral medications and was able to drink 1.2 liters yesterday. Had 2 BMs yesterday. No abdominal pain. He has had some LE swelling that is improving. Walked 88 laps yesterday. Plan for discharge in the next day or two if remains afebrile and able to keep himself hydrated.      Review of Systems:  A comprehensive ROS performed and is negative except for pertinent positives as listed above in interval history.     Test Results:   I reviewed all labs from today in Epic. See EMR for lab results.    Scheduled Meds:  ??? amLODIPine  10 mg Oral Daily   ??? clonazePAM  0.5 mg Oral Daily   ??? clonazePAM  1 mg Oral Nightly   ??? famotidine  20 mg Oral BID   ??? fluconazole  400 mg Oral Daily   ??? heparin, porcine (PF)  2 mL Intravenous Q MWF   ??? letermovir  480 mg Oral Daily   ??? nicotine  1 patch Transdermal Q24H   ??? potassium chloride  20 mEq Oral BID   ??? pregabalin  75 mg Oral BID   ??? tacrolimus  2.5 mg Oral BID   ??? ursodioL  300 mg Oral TID   ??? valACYclovir  500 mg Oral Daily     Continuous Infusions:  ??? IP okay to treat     ??? sodium chloride 20 mL/hr (02/15/20 0402)     PRN Meds: acetaminophen, aluminum-magnesium hydroxide-simethicone, carboxymethylcellulose sodium, CETAPHIL, emollient combination no.92, hydrALAZINE, oxyCODONE **OR** HYDROmorphone, IP okay to treat, lidocaine 2% viscous, loperamide, loperamide, magic mouthwash oral, magnesium sulfate in water, melatonin, lidocaine-diphenhydramine-aluminum-magnesium, potassium chloride in water, prochlorperazine **OR** prochlorperazine, traZODone    Physical Exam: mostly unchanged from prior exam on 02/20/20  BP 107/70  - Pulse 99  - Temp 36.9 ??C (98.4 ??F) (Oral)  - Resp 16  - Ht 171.2 cm (5' 7.4)  - Wt 79.4 kg (175 lb 1.6 oz)  - SpO2 95%  - BMI 27.10 kg/m??       Intake/Output Summary (Last 24 hours) at 02/22/2020 1225  Last data filed at 02/22/2020 1158  Gross per 24 hour   Intake 3392 ml   Output 2700 ml   Net 692 ml     General: No acute distress noted.   Central venous access: Line clean, dry, intact. No erythema or drainage noted.   ENT: Moist mucous membranes, healing mucosa to right and left buccal areas and tongue. Some scabs on lips, no bleeding.   Cardiovascular: Tachycardic, with regular rate and rhythm. S1 and S2 normal, without any murmur, rub, or gallop.  Lungs: Clear to auscultation bilaterally, without wheezes/crackles/rhonchi. Good air movement.   Skin: Warm, dry, intact. No rash noted.    Psychiatry: Alert and oriented to  person, place, and time.   Gastrointestinal/Abdomen: Normoactive bowel sounds, abdomen soft, non-tender   Musculoskeletal/Extremities: FROM throughout. Trace edema bilaterally around ankles up to mid shin, improved from the weekend.   Neurologic: CNII-XII intact. Normal strength and sensation throughout    Assessment/Plan:    BMT: AML, CR MRD Negative  HCT-CI (age adjusted) 7 (psych, severe pulmonary dysfunction, and age)   Conditioning: MAC Bu/Flu/ATG  Donor: 9/10, ABO O+, CMV+  Engraftment: Granix starting D+12 through engraftment (as defined as ANC 1.0 x 2 days or 3.0 x 1 day)  - Date of last granix injection: 02/19/20  ??  GVHD prophylaxis:   1.Tacrolimus being managed by pharmacy with a goal of 5-10 ng/mL  2. Methotrexate??15 mg/m2 IVP on day +1 then 10 mg/m2 on days +3, +6 and +11  3. ATG per [facility] standard dosing will??be included  ??  Heme:   Transfusion criteria: 1 unit of PRBCs for Hgb<7 and 1 unit of platelets for Plt <10K or bleeding.   - No history of transfusion reactions.     Elevated INR (related to nutrition deficit)  - INR 1.7 on 9/30, started PO vit k 5 mg x 3 days  - 02/22/20: INR 1.2    ID:   Prophylaxis:  - Antiviral: Valtrex 500 mg po daily   - Antifungal: Fluconazole 400 mg po daily, continue through day +75  - PJP: Dapsone (has Sulfa allergy) upon platelet engraftment.     ** G6PD normal, 8.3  - CMV D+/R+: Letermovir prophy, 480mg  po daily starting D+15 or on hospital discharge (whichever is sooner), continue through D+100   ??  Neutropenic Fever:  02/03/20: Tmax 38.4 Cefepime (9/16 - 9/22)  - Cultures NGx 5 days    02/13/20: Spiked again at 5am 9/26  - Cultures negative at 5 days  - Started on IV cefepime (9/26- 10/1) and IV flagyl (anaerobic coverage given mucositis) (9/26-10/1). Received a dose of IV vanc but no indication to continue.     Allergy:  - PCN allergy (hives) has tolerated Cefepime without issues  ??  EBV:  - IgM Ab + 01/05/20 but viral load 01/05/20 not detected.     GI:   GERD prophylaxis:  - Pepcid BID  ??  Nausea:  - Anti-emetics per BMT protocol.     Mucositis (chemotherapy related):  - Improved, stop PCA 10/4, will have oral oxy or prn iv dilaudid    Renal:   - AKI: Improved with IV fluids, has not required any additional  ??  FEN:  PO intake improving and now developing some LE edema,  Minimizing iv fluids and monitor po intake.  - Lasix 20 mg iv once 10/2     Electrolytes:  - Replacing mag and potassium per standing orders  - 30 mmol of K phos given 10/4, repeat on 10/5 3.2    Supplements: Held on admission  - B complex, Vitamin D  ??  Hepatic:   - No active issues.   - VOD prophylaxis with Actigall TID.     CV:    HTN: New, possibly tac related  - Amlodipine 5mg  daily, increased to 10 mg 9/28 for persistent hypertension.     Pulm: DLCO 62%  - Former smoker, quit 01/12/20. Using nicotine patch currently. Has chronic dry cough but PFTs were acceptable.   07/24/19: CT chest w/ 0.6cm RUL nodules, no clear etiology.     ** Discussed with Dr. Oswald Hillock, ok to move forward, no plans to repeat unless  new findings.       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     Mucositis: (chemotherapy induced)   Grade 3,  Dilaudid PCA (AKI so Dilaudid chosen over Morphine)   - 9/23 started with 0.5 mg PCA dose q 15 min prn, titrate up as needed  - 9/27: Increased bolus to 0.75 mg q21min  -9/28: Decreased back to 0.5 dosing per patient request as sleeping too frequently  -9/30: Decrease to 0.25 for pt request with improvment   -10/4: Stopped PCA, will transition back to oral oxy 10 mg po q 4 hr prn, will also have iv prn dilaudid for severe pain  -10/5: Received oxy this morning with good relief.     Psychosocial:   - Substance abuse (opioid history). Sober for 5 years.  - Followed by Frederik Schmidt in [facility], placed referral for [facility] while admitted, are not seeing inpatients, will follow- up with him post discharge.   - Insomnia: Melatonin 3mg  and Trazodone 50mg  nightly prn.   - Anxiety: Hospitalization related. Well controlled.     ** Klonopin 0.5/1mg  daily, at discharge will stop morning dose and keep evening dose.   ??  Summary: Plan as above D+20  - Mucositis improving, oxy prn  - No longer neutropenic and afebrile with negative cultures.  - Replacing mag IV today. Starting oral potassium today as well.   - Anticipate discharge on Wednesday    Kathlee Nations Shahram Alexopoulos Adult Nurse Practitioner  02/22/2020  12:25 PM

## 2020-02-22 NOTE — Unmapped (Signed)
[facility] Specialty Medication Onboarding    Specialty Medication: Tacrolimus 0.5mg  capsules  Prior Authorization: Not Required   Financial Assistance: No - copay  <$25  Final Copay/Day Supply: $10 / 30 days    Insurance Restrictions: Yes - max 1 month supply     Notes to Pharmacist: Patient is admitted to room 1106 and being discharged tomorrow AM 10/6. Patients room # [phone]. Sherlynn Carbon [phone]. Urgent PA referral entered for Prevymis.     The triage team has completed the benefits investigation and has determined that the patient is able to fill this medication at [facility]. Please contact the patient to complete the onboarding or follow up with the prescribing physician as needed.

## 2020-02-23 DIAGNOSIS — Z9484 Stem cells transplant status: Principal | ICD-10-CM

## 2020-02-23 LAB — BASIC METABOLIC PANEL
ANION GAP: 6 mmol/L (ref 5–14)
BLOOD UREA NITROGEN: 7 mg/dL — ABNORMAL LOW (ref 9–23)
BUN / CREAT RATIO: 5
CALCIUM: 8.1 mg/dL — ABNORMAL LOW (ref 8.7–10.4)
CHLORIDE: 106 mmol/L (ref 98–107)
CO2: 26 mmol/L (ref 20.0–31.0)
CREATININE: 1.3 mg/dL
EGFR CKD-EPI AA MALE: 70 mL/min/{1.73_m2} (ref >=60)
EGFR CKD-EPI NON-AA MALE: 61 mL/min/{1.73_m2} (ref >=60)
GLUCOSE RANDOM: 192 mg/dL — ABNORMAL HIGH (ref 70–179)
POTASSIUM: 3.7 mmol/L (ref 3.4–4.5)

## 2020-02-23 LAB — PHOSPHORUS: Phosphate:MCnc:Pt:Ser/Plas:Qn:: 4.1

## 2020-02-23 LAB — CBC W/ AUTO DIFF
BASOPHILS ABSOLUTE COUNT: 0 10*9/L (ref 0.0–0.1)
BASOPHILS RELATIVE PERCENT: 1 %
EOSINOPHILS ABSOLUTE COUNT: 0 10*9/L (ref 0.0–0.4)
EOSINOPHILS RELATIVE PERCENT: 0.3 %
HEMATOCRIT: 23.3 % — ABNORMAL LOW (ref 41.0–53.0)
LARGE UNSTAINED CELLS: 16 % — ABNORMAL HIGH (ref 0–4)
LYMPHOCYTES ABSOLUTE COUNT: 0.1 10*9/L — ABNORMAL LOW (ref 1.5–5.0)
MEAN CORPUSCULAR HEMOGLOBIN CONC: 32.5 g/dL (ref 31.0–37.0)
MEAN CORPUSCULAR HEMOGLOBIN: 36.5 pg — ABNORMAL HIGH (ref 26.0–34.0)
MEAN CORPUSCULAR VOLUME: 112.1 fL — ABNORMAL HIGH (ref 80.0–100.0)
MEAN PLATELET VOLUME: 13.3 fL — ABNORMAL HIGH (ref 7.0–10.0)
MONOCYTES ABSOLUTE COUNT: 0.7 10*9/L (ref 0.2–0.8)
MONOCYTES RELATIVE PERCENT: 20.9 %
NEUTROPHILS ABSOLUTE COUNT: 1.9 10*9/L — ABNORMAL LOW (ref 2.0–7.5)
NEUTROPHILS RELATIVE PERCENT: 59.7 %
PLATELET COUNT: 34 10*9/L — ABNORMAL LOW (ref 150–440)
RED BLOOD CELL COUNT: 2.08 10*12/L — ABNORMAL LOW (ref 4.50–5.90)
RED CELL DISTRIBUTION WIDTH: 19.3 % — ABNORMAL HIGH (ref 12.0–15.0)
WBC ADJUSTED: 3.2 10*9/L — ABNORMAL LOW (ref 4.5–11.0)

## 2020-02-23 LAB — MAGNESIUM
Magnesium:MCnc:Pt:Ser/Plas:Qn:: 1.1 — ABNORMAL LOW
Magnesium:MCnc:Pt:Ser/Plas:Qn:: 2.3

## 2020-02-23 LAB — TACROLIMUS, TROUGH: Lab: 11.1

## 2020-02-23 LAB — CHLORIDE: Chloride:SCnc:Pt:Ser/Plas:Qn:: 106

## 2020-02-23 LAB — ANISOCYTOSIS

## 2020-02-23 MED ORDER — AMLODIPINE 10 MG TABLET
ORAL_TABLET | Freq: Every day | ORAL | 3 refills | 90 days | Status: CP
Start: 2020-02-23 — End: ?

## 2020-02-23 MED ORDER — POTASSIUM CHLORIDE ER 20 MEQ TABLET,EXTENDED RELEASE(PART/CRYST)
ORAL_TABLET | Freq: Two times a day (BID) | ORAL | 3 refills | 30.00000 days | Status: CP
Start: 2020-02-23 — End: 2020-02-23
  Filled 2020-02-23: qty 60, 30d supply, fill #0

## 2020-02-23 MED ORDER — LETERMOVIR 480 MG TABLET
ORAL_TABLET | Freq: Every day | ORAL | 2 refills | 28.00000 days | Status: CP
Start: 2020-02-23 — End: 2020-02-22
  Filled 2020-02-25: qty 28, 28d supply, fill #0

## 2020-02-23 MED ORDER — POTASSIUM CHLORIDE ER 20 MEQ TABLET,EXTENDED RELEASE(PART/CRYST): 20 meq | tablet | Freq: Two times a day (BID) | 3 refills | 30 days | Status: AC

## 2020-02-23 MED ORDER — AMLODIPINE 5 MG TABLET
ORAL_TABLET | Freq: Every day | ORAL | 5 refills | 30 days | Status: CP
Start: 2020-02-23 — End: ?
  Filled 2020-02-23: qty 30, 30d supply, fill #0

## 2020-02-23 MED ORDER — FLUCONAZOLE 200 MG TABLET
ORAL_TABLET | Freq: Every day | ORAL | 2 refills | 30.00000 days | Status: CP
Start: 2020-02-23 — End: ?
  Filled 2020-02-23: qty 60, 30d supply, fill #0

## 2020-02-23 MED ORDER — TACROLIMUS 0.5 MG CAPSULE, IMMEDIATE-RELEASE
ORAL_CAPSULE | Freq: Two times a day (BID) | ORAL | 5 refills | 30.00000 days | Status: CP
Start: 2020-02-23 — End: ?

## 2020-02-23 MED ADMIN — heparin, porcine (PF) 100 unit/mL injection 2 mL: 2 mL | INTRAVENOUS | @ 12:00:00 | Stop: 2020-02-23

## 2020-02-23 MED ADMIN — oxyCODONE (ROXICODONE) immediate release tablet 10 mg: 10 mg | ORAL | @ 01:00:00 | Stop: 2020-03-06

## 2020-02-23 MED ADMIN — famotidine (PEPCID) tablet 20 mg: 20 mg | ORAL

## 2020-02-23 MED ADMIN — ursodioL (ACTIGALL) capsule 300 mg: 300 mg | ORAL | @ 18:00:00 | Stop: 2020-02-23

## 2020-02-23 MED ADMIN — pregabalin (LYRICA) capsule 75 mg: 75 mg | ORAL

## 2020-02-23 MED ADMIN — magnesium sulfate in water 2 gram/50 mL (4 %) IVPB 2 g: 2 g | INTRAVENOUS | @ 11:00:00 | Stop: 2020-02-23

## 2020-02-23 MED ADMIN — pregabalin (LYRICA) capsule 75 mg: 75 mg | ORAL | @ 12:00:00 | Stop: 2020-02-23

## 2020-02-23 MED ADMIN — oxyCODONE (ROXICODONE) immediate release tablet 10 mg: 10 mg | ORAL | @ 12:00:00 | Stop: 2020-02-23

## 2020-02-23 MED ADMIN — sodium chloride 0.9% (NS) bolus 500 mL: 500 mL | INTRAVENOUS | @ 15:00:00 | Stop: 2020-02-23

## 2020-02-23 MED ADMIN — oxyCODONE (ROXICODONE) immediate release tablet 10 mg: 10 mg | ORAL | @ 05:00:00 | Stop: 2020-02-23

## 2020-02-23 MED ADMIN — fluconazole (DIFLUCAN) tablet 400 mg: 400 mg | ORAL | @ 12:00:00 | Stop: 2020-02-23

## 2020-02-23 MED ADMIN — sodium chloride 0.9% (NS) bolus 500 mL: 500 mL | INTRAVENOUS | @ 14:00:00 | Stop: 2020-02-23

## 2020-02-23 MED ADMIN — ursodioL (ACTIGALL) capsule 300 mg: 300 mg | ORAL

## 2020-02-23 MED ADMIN — clonazePAM (KlonoPIN) tablet 1 mg: 1 mg | ORAL

## 2020-02-23 MED ADMIN — potassium chloride (KLOR-CON) CR tablet 20 mEq: 20 meq | ORAL | @ 12:00:00 | Stop: 2020-02-23

## 2020-02-23 MED ADMIN — tacrolimus (PROGRAF) 2.5mg combo product: 2.5 mg | ORAL

## 2020-02-23 MED ADMIN — amLODIPine (NORVASC) tablet 10 mg: 10 mg | ORAL | @ 12:00:00 | Stop: 2020-02-23

## 2020-02-23 MED FILL — CLONAZEPAM 1 MG TABLET: 7 days supply | Qty: 7 | Fill #0 | Status: AC

## 2020-02-23 MED FILL — FLUCONAZOLE 200 MG TABLET: 30 days supply | Qty: 60 | Fill #0 | Status: AC

## 2020-02-23 MED FILL — FAMOTIDINE 20 MG TABLET: 30 days supply | Qty: 60 | Fill #0 | Status: AC

## 2020-02-23 MED FILL — TRAZODONE 50 MG TABLET: 90 days supply | Qty: 90 | Fill #0 | Status: AC

## 2020-02-23 MED FILL — AMLODIPINE 5 MG TABLET: 30 days supply | Qty: 30 | Fill #0 | Status: AC

## 2020-02-23 MED FILL — TACROLIMUS 0.5 MG CAPSULE, IMMEDIATE-RELEASE: 30 days supply | Qty: 300 | Fill #0 | Status: AC

## 2020-02-23 MED FILL — MG-PLUS-PROTEIN 133 MG TABLET: 25 days supply | Qty: 100 | Fill #0 | Status: AC

## 2020-02-23 MED FILL — POTASSIUM CHLORIDE ER 20 MEQ TABLET,EXTENDED RELEASE(PART/CRYST): 30 days supply | Qty: 60 | Fill #0 | Status: AC

## 2020-02-23 MED FILL — OXYCODONE 10 MG TABLET: 7 days supply | Qty: 42 | Fill #0 | Status: AC

## 2020-02-23 MED FILL — PREGABALIN 75 MG CAPSULE: 30 days supply | Qty: 60 | Fill #0 | Status: AC

## 2020-02-23 MED FILL — URSODIOL 300 MG CAPSULE: 30 days supply | Qty: 90 | Fill #0 | Status: AC

## 2020-02-23 MED FILL — VALACYCLOVIR 500 MG TABLET: 30 days supply | Qty: 30 | Fill #0 | Status: AC

## 2020-02-23 NOTE — Unmapped (Signed)
Date: 02/23/2020    [facility] Home Infusion Nurse Coordinator Note: John Mirza, MSN, RN    RN Liaison assessment and referral coordination completed using precautionary measures in accordance with [facility] COVID-19 Pandemic emergency response plan. Patient and wife verbalized understanding and agreement with completing this assessment via telephone or in person visit.    Patient: John Service  EDD: 02/23/2020    Address and Demographics Verified:    [address]   [place]   [place] [address]    Patient Cell: [phone]  Wife: John Erickson [phone]     DRUG: Line Care    CVAD LINE: TL Power 01/26/2020    Date of Medication Delivery: Day of Discharge  Other Clinical needs: NA  A1C:   Home Health Agency Seeing Patient: [facility] Dosing: Yes prior to D/C   Therapeutic Drug level:     Clinical Nurse Liaison Visit: Liaison spoke to wife for verification of demographics/contact information. She was informed & made aware of Home Infusion/Home Health services, responsibilities and agreed to discharge plan of care for HH/Infusion Therapy. Wife is agreeable and open for IV therapy, and has been taught line care and dressing changes by nursing staff before discharge. No barriers for doing home infusion identified. Has good family support for care at home. Wife verbalized that she is comfortable with the line care maintenance.     Reviewed troubleshooting tips for device and line management. Instructed to keep CVAD dressing clean, dry and intact at home. Reinforced proper care of the access device and any activity limitations.     Discussed precautions for preventing infection and other complications including: aseptic technique and hand hygiene; signs and symptoms of complications to report; who to contact for questions or nursing needs.     Reviewed supplies will arrive carrier services in a brown box. Inspect your supplies when they arrive. You will be asked to return a signed copy of the delivery ticket. Most medications sent to your home will need to be refrigerated (check label for clarification). Your Home Health Nurse will provide further education on the proper medication storage and disposal.     RN Liaison educated patient/caregiver regarding COVID-19 exposure avoidance including of importance of personal and family hygiene with frequent hand washing for 20 seconds with soap and water, avoidance of exposure to people who are sick, avoid crowds, and maintain social distancing, use proper respiratory hygiene including sneeze/cough into elbow. Wear a mask when you will be in public.     If caregivers are coming into your home to help with your care, instruct them to wear a mask and gloves. Further education provided that if patient demonstrates respiratory illness and fever, they should quarantine to one area of the home and contact their physician or this [facility] agency for additional instructions.     Follow-up Questions regarding travel outside the [place] within the last 30days: NO to all questions     - Have you traveled in the past 14 days?   - Do you have fever and/or symptoms of respiratory illness? (cough, difficulty breathing)  - Have you had close contact with a person with confirmed or suspected novel Corona Virus in the last 14 days before symptoms began?  - Do you have any of the following symptoms that are new or worsening: cough, congestion, and shortness of breath, loss of taste/smell, sore throat, fever or feeling feverish, chills, muscle pain, headache, nausea, vomiting, or diarrhea?  - Have you had close contact  with a person with confirmed COVID-19 in the last 21 days before symptoms began?  - Have you tested positive in the last 21 days for COVID-19    Offer to Counsel: Do you have any questions for the Pharmacist regarding:  No  Gave patient contact numbers, and educated to call [facility] at [phone] for infusion issues and Home Health Agency for nursing questions. For life-threatening event, instructed to call 911.     Patient and Caregiver Questions: All questions were addressed or answered by Liaison. Patient/care provider had no further questions.    Information Updates Provided to: [facility], [facility].    Reviewed plan: Patient verbalized understanding.

## 2020-02-23 NOTE — Unmapped (Addendum)
Pharmacy Transition Note:    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 His transplant course was complicated by culture negative neutropenic fever and mucositis. Mucositis was severe enough to require PCA. Has been able to transition since to PRN oxycodone (10/4).      The following was done in preparation for hospital discharge:   ??? Discharge medication reconciliation   ??? Discharge medication list (Med Action Plan), including medication name, dose/frequency, and indication, given to patient   ??? Medication counseling provided to patient and caregiver   ??? Medication copays and prescription insurance issues addressed with both discharge pharmacy and patient    Pharmacy Information   Pharmacy used for discharge medications: [facility], Phone: [phone] and [facility], Phone: [phone]     Issues with acquiring medications and/or insurance coverage: prior authorization required for letermovir, and specialty pharmacy required for tacrolimus and letermovir    Recommendations for outpatient follow up:     **BMT  - WBC: 3.2, ANC 1.9, Hgb 7.6, PLT 34  - Tac trough this morning was 11.1 which is supratherapeutic. Trough was uptrending which may be due to adding letermovir on 9/30 and a Scr bump this morning. Will have him hold a dose tonight, 10/6, and restart at 2 mg BID    **ID  1. Prophylaxis  - Antibacterial - non prophylaxis indicated   - Antifungal - fluconazole 400 mg PO QDay through day + 75  - Antiviral - valacyclovir 500 mg PO QDay through for 12 years  - PCP - dapsone 100 mg PO QDay through completion of immunosuppression. Platelets have not engrafted yet, so did not send with prescription  - CMV - letermovir 480 mg PO daily through day +100. Did not go home with this medication (see med access section below)     **CV  - Had some hypertension while admitted. Amlodipine was started inpatient and increased to 10 mg on 9/28. Got slightly hypotensive (90/61) in the morning on 10/6, so decision was to reduce to 5 mg  - Continue amlodipine 5 mg daily. Assess need for dose adjustments as indicated    **Hepatic  - Continue ursodiol 300 mg TID for VOD/SOS ppx    **FENGI  - Recommend continuing famotidine for 2 weeks following discharge.  At that time, [name] may transition to PRN use or DC if there is no ongoing need  - Continue PRN use of  compazine for nausea/vomiting  - Continue PRN use of loperamide for diarrhea  - Required around 80-120 mEq of K replacement for the past few days. Was transitioned to 20 mEq BID on 10/5. Continue potassium 20 mEq BID. CTM levels and adjust as needed  - Required 2-4 g of Mg for the past few days. Continue magnesium plus protein 266 mg BID. CTM levels and adjust as needed  - Continue cholecalciferol 1,000 units daily  - Continue vitamin B complex daily    **Neuro/pain  - Had severe mucositis requiring PCA for pain control. Was able to transition to oral pain medicines PRN well. Also had some anxiety/trouble sleeping at night requiring clonazepam 0.5 mg in daily and 1 mg nightly. Plan to discharge on nightly dose and continue to wean from there.  - Continue PRN use of oxycodone for pain control. Prescribed #42 tablets. Assess need for further refills  - Continue clonazepam 1 mg nightly. Plan to titrate off and told only  use if needed  - Continue Lyrica 75 mg BID for peripheral neuropathy  - Continue melatonin 2 mg nightly for sleep  - Continue trazodone 50 mg nightly PRN for sleep    **Med Access  - Test claims for tacrolimus and letermovir were sent on admission. Insurance unexpectedly changed earlier this month. Letermovir now requires a PA and has not gone through yet. Received dose today while admitted (10/6). Will likely miss one dose tomorrow. Plan to receive in clinic on 10/8.        [name] will follow up in the BMT Clinic on 02/25/20, where he will be seen by a clinic pharmacist to address any additional medication questions and issues.     [place] Cella Cappello, PharmD  PGY2 Pediatric Hematology/Oncology Pharmacy Resident      Discharge medications:     Your Medication List      STOP taking these medications    ibuprofen 200 MG tablet  Commonly known as: ADVIL,MOTRIN     levETIRAcetam 1000 MG tablet  Commonly known as: KEPPRA        START taking these medications    amLODIPine 15 MG tablet  Commonly known as: NORVASC  Take 1 tablet (5 mg total) by mouth daily.  SENT   clonazePAM 1 MG tablet  Commonly known as: KlonoPIN  Take 1 tablet (1 mg total) by mouth nightly.  SENT   famotidine 20 MG tablet  Commonly known as: PEPCID  Take 1 tablet (20 mg total) by mouth Two (2) times a day.  SENT   fluconazole 200 MG tablet  Commonly known as: DIFLUCAN  Take 2 tablets (400 mg total) by mouth daily.  SENT   letermovir 480 mg tablet  Commonly known as: PREVYMIS  Take 1 tablet (480 mg total) by mouth daily.  SENT ([facility])   magnesium (amino acid chelate) 133 mg Tab  Generic drug: magnesium oxide-Mg AA chelate  Take 2 tablets by mouth two (2) times a day.  SENT   oxyCODONE 10 mg immediate release tablet  Commonly known as: ROXICODONE  Take 1 tablet (10 mg total) by mouth every four (4) hours as needed for up to 7 days.  SENT   potassium chloride 20 MEQ CR tablet  Commonly known as: KLOR-CON  Take 1 tablet (20 mEq total) by mouth Two (2) times a day.  SENT   tacrolimus 0.5 MG capsule  Commonly known as: PROGRAF  Take 4 capsules (2 mg total) by mouth two (2) times a day starting 10/7.  SENT ([facility])   ursodioL 300 mg capsule  Commonly known as: ACTIGALL  Take 1 capsule (300 mg total) by mouth Three (3) times a day.  SENT      CONTINUE taking these medications    b complex vitamins capsule  Take 1 capsule by mouth daily.  Has   melatonin 1 mg Tab tablet  Take 2 mg by mouth nightly.  Has   nicotine 21 mg/24 hr patch  Commonly known as: NICODERM CQ  Place 1 patch on the skin daily.  Has   pregabalin 75 MG capsule  Commonly known as: LYRICA  Take 1 capsule (75 mg total) by mouth Two (2) times a day.  SENT   prochlorperazine 10 MG tablet  Commonly known as: COMPAZINE  TAKE 1 TABLET (10 MG TOTAL) BY MOUTH EVERY SIX (6) HOURS AS NEEDED FOR NAUSEA.  Has   traZODone 50 MG tablet  Commonly known as: DESYREL  Take 1 tablet (50  mg total) by mouth nightly as needed for sleep (insomnia).  SENT   valACYclovir 500 MG tablet  Commonly known as: VALTREX  Take 1 tablet (500 mg total) by mouth daily.  Has   VITAMIN D3 25 mcg (1,000 unit) capsule  Generic drug: cholecalciferol (vitamin D3-25 mcg (1,000 unit))  Take 25 mcg by mouth daily.  Has

## 2020-02-23 NOTE — Unmapped (Signed)
Clinical Assessment Needed For: Dose Change  Medication: Tacrolimus 0.5mg  capsule  Last Fill Date/Day Supply: 02/23/20 / 30 days  Refill Too Soon until 03/17/20  Was previous dose already scheduled to fill: No    Notes to Pharmacist:

## 2020-02-23 NOTE — Unmapped (Signed)
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 Discharge test completed by patient, and caregiver, Cassie. AVS given, discharge supplies given. Pharmacists in with patient about medication management teaching. Patient denies pain, nausea, vomiting, and diarrhea.       Problem: Adult Inpatient Plan of Care  Goal: Plan of Care Review  Outcome: Resolved  Goal: Patient-Specific Goal (Individualized)  Outcome: Resolved  Goal: Absence of Hospital-Acquired Illness or Injury  Outcome: Resolved  Intervention: Identify and Manage Fall Risk  Recent Flowsheet Documentation  Taken 02/23/2020 0747 by Idell Pickles, RN  Safety Interventions:   bleeding precautions   fall reduction program maintained   family at bedside   lighting adjusted for tasks/safety   neutropenic precautions   nonskid shoes/slippers when out of bed  Intervention: Prevent and Manage VTE (Venous Thromboembolism) Risk  Recent Flowsheet Documentation  Taken 02/23/2020 0927 by Idell Pickles, RN  VTE Prevention/Management:   ambulation promoted   intravenous hydration   fluids promoted  Taken 02/23/2020 0747 by Idell Pickles, RN  Activity Management: up ad lib  Intervention: Prevent Infection  Recent Flowsheet Documentation  Taken 02/23/2020 0747 by Idell Pickles, RN  Infection Prevention:   cohorting utilized   environmental surveillance performed   equipment surfaces disinfected   hand hygiene promoted   personal protective equipment utilized   rest/sleep promoted   visitors restricted/screened   single patient room provided  Goal: Optimal Comfort and Wellbeing  Outcome: Resolved  Goal: Readiness for Transition of Care  Outcome: Resolved  Goal: Rounds/Family Conference  Outcome: Resolved     Problem: Infection  Goal: Absence of Infection Signs and Symptoms  Outcome: Resolved  Intervention: Prevent or Manage Infection  Recent Flowsheet Documentation  Taken 02/23/2020 0747 by Idell Pickles, RN  Infection Management: aseptic technique maintained  Isolation Precautions: protective precautions maintained     Problem: Anemia (Chemotherapy Effects)  Goal: Anemia Symptom Improvement  Outcome: Resolved  Intervention: Monitor and Manage Anemia  Recent Flowsheet Documentation  Taken 02/23/2020 0747 by Idell Pickles, RN  Safety Interventions:   bleeding precautions   fall reduction program maintained   family at bedside   lighting adjusted for tasks/safety   neutropenic precautions   nonskid shoes/slippers when out of bed     Problem: Urinary Bleeding Risk or Actual (Chemotherapy Effects)  Goal: Absence of Hematuria  Outcome: Resolved     Problem: Nausea and Vomiting (Chemotherapy Effects)  Goal: Fluid and Electrolyte Balance  Outcome: Resolved     Problem: Neurotoxicity (Chemotherapy Effects)  Goal: Neurotoxicity Symptom Control  Outcome: Resolved     Problem: Neutropenia (Chemotherapy Effects)  Goal: Absence of Infection  Outcome: Resolved  Intervention: Prevent Infection and Maximize Resistance  Recent Flowsheet Documentation  Taken 02/23/2020 0747 by Idell Pickles, RN  Infection Prevention:   cohorting utilized   environmental surveillance performed   equipment surfaces disinfected   hand hygiene promoted   personal protective equipment utilized   rest/sleep promoted   visitors restricted/screened   single patient room provided     Problem: Oral Mucositis (Chemotherapy Effects)  Goal: Improved Oral Mucous Membrane Integrity  Outcome: Resolved  Intervention: Promote Oral Comfort and Health  Recent Flowsheet Documentation  Taken 02/23/2020 4540 by Idell Pickles, RN  Oral Mucous Membrane Protection:   nonirritating oral foods promoted   nonirritating oral fluids promoted     Problem: Thrombocytopenia Bleeding Risk (Chemotherapy Effects)  Goal: Absence of Bleeding  Outcome: Resolved  Intervention: Minimize Bleeding Risk  Recent Flowsheet Documentation  Taken 02/23/2020 0747 by Idell Pickles, RN  Bleeding Precautions:   blood pressure closely monitored   coagulation study results reviewed   foot protection facilitated   gentle oral care promoted   monitored for signs of bleeding

## 2020-02-23 NOTE — Unmapped (Signed)
BMTCTP Discharge Summary    Admit date: 01/26/2020  Discharge date: 02/23/20  Discharge to: Home in [place]  Discharge Service: MDT    Referring physician:  Charlotta Newton, MD   BMT Attending MD: Lanae Boast, MD     Disease: AML  Current disease status: CR MRD Negative  Type of Transplant: MAC MMUD  Graft Source: Fresh PBSCs  Transplant Day: +21     Procedures: allogenic stem cell transplant  Discharge diagnosis/complications: Pancytopenia due to conditioning chemotherapy, Culture negative neutropenic fevers  and mucositis requiring PCA    Donor information:   Type of stem cells: unrelated male  Blood Type: O+  CMV Status: positive  Type of match: 9/10    Interval History:   John Erickson reports feeling fairly well this morning. He still has some oral pain but says that oxycodone has been effective for him. Was able to eat soup a few times yesterday, able to take all of his pills and took in 2.2 liters. Asymptomatic hypotension this morning. Had already received this morning's norvasc dose but will decrease subsequent doses to 5 mg daily. SCr also up this morning to 1.3. Given lower BP and decreased kidney function, will give 1 liter of IV fluids prior to discharge. No n/v/d or rash. Walked another 22 laps yesterday and has now completed 2 marathons!    ROS:  Comprehensive ROS negative except pertinent positives listed in interval history.     Physical exam:  Vitals:    02/23/20 0949   BP: 102/68   Pulse:    Resp:    Temp:    SpO2:      Vitals:    02/21/20 2111 02/22/20 2020   Weight: 79.4 kg (175 lb 1.6 oz) 78.7 kg (173 lb 9.6 oz)     KPS at discharge: 90,  Able to carry on normal activity; minor signs or symptoms of disease (ECOG equivalent 0)    General: No acute distress noted.   Central venous access: Line clean, dry, intact. No erythema or drainage noted.   ENT: Moist mucous membranes, healing mucosa to right and left buccal areas and tongue. Some scabs on lips, no bleeding.   Cardiovascular: Tachycardic, with regular rate and rhythm. S1 and S2 normal, without any murmur, rub, or gallop.  Lungs: Clear to auscultation bilaterally, without wheezes/crackles/rhonchi. Good air movement.   Skin: Warm, dry, intact. No rash noted.    Psychiatry: Alert and oriented to person, place, and time.   Gastrointestinal/Abdomen: Normoactive bowel sounds, abdomen soft, non-tender   Musculoskeletal/Extremities: FROM throughout. Trace edema bilaterally around ankles up to mid shin, improved from the weekend.   Neurologic: CNII-XII intact. Normal strength and sensation throughout    Lab Results   Component Value Date    WBC 3.2 (L) 02/22/2020    HGB 7.6 (L) 02/22/2020    HCT 23.3 (L) 02/22/2020    PLT 34 (L) 02/22/2020     Lab Results   Component Value Date    NA 138 02/22/2020    K 3.7 02/22/2020    CL 106 02/22/2020    CO2 26.0 02/22/2020    BUN 7 (L) 02/22/2020    CREATININE 1.30 02/22/2020    GLU 192 (H) 02/22/2020    CALCIUM 8.1 (L) 02/22/2020    MG 1.1 (L) 02/22/2020    PHOS 4.1 02/22/2020     Lab Results   Component Value Date    BILITOT 0.5 02/21/2020    BILIDIR 0.30 02/21/2020  PROT 4.9 (L) 02/21/2020    ALBUMIN 2.7 (L) 02/21/2020    ALT 11 02/21/2020    AST 11 02/21/2020    ALKPHOS 93 02/21/2020    GGT 21 01/26/2020     Lab Results   Component Value Date    PT 14.1 (H) 02/22/2020    INR 1.21 02/22/2020    APTT 33.2 02/17/2020     Hospital Course/Assessment and Plan    BMT:??AML, CR MRD Negative  HCT-CI (age adjusted) 5??(psych, severe pulmonary dysfunction, and age)   Conditioning:??MAC Bu/Flu/ATG  Donor:??9/10, ABO O+, CMV+  Engraftment:??Granix starting D+12 through engraftment (as defined as ANC 1.0 x 2 days or 3.0 x 1 day)  - Date of last granix injection: 02/19/20  ??? Date of neutrophil engraftment (first day of ANC ? 500/mm3 for 3 consecutive days): 02/18/20  ??? Date of platelet engraftment (>20K x3 days without transfusion in 7 days): TBD  ??? Number of RBC infusions during transplant hospitalization: 0 units  ??? Number of platelet infusions during transplant hospitalization: 5 units  ??  GVHD prophylaxis:??  1.Tacrolimus being managed by pharmacy with a goal of 5-10 ng/mL  2. Methotrexate??15 mg/m2 IVP on day +1 then 10 mg/m2 on days +3, +6 and +11  3. ATG per [facility] standard dosing will??be included  ??  Heme:??  Transfusion criteria:??1 unit of PRBCs for Hgb<7 and 1 unit of platelets for Plt <10K or bleeding.   -??No history of transfusion reactions.   ??  Elevated INR (related to nutrition deficit)  - INR 1.7 on 9/30, started PO vit k 5 mg x 3 days  - 02/22/20: INR 1.2    ID:??  Prophylaxis:  - Antiviral: Valtrex 500 mg po daily   - Antifungal: Fluconazole 400 mg po daily, continue through day +75  - PJP: Dapsone (has Sulfa allergy) upon platelet engraftment.   ????** G6PD normal, 8.3  - CMV D+/R+: Letermovir prophy, 480mg  po daily starting D+15 or on hospital discharge (whichever is sooner), continue through D+100   ??  Neutropenic Fever:  02/03/20: Tmax 38.4 Cefepime (02/03/20 - 02/09/20)  - Cultures NGx 5 days  ??  02/13/20: Spiked again at 5am 9/26  - Cultures negative at 5 days  - Started on IV cefepime (9/26- 10/1) and IV flagyl (anaerobic coverage given mucositis) (9/26-10/1). Received a dose of IV vanc but no indication to continue.   ??  Allergy:  - PCN allergy (hives) has tolerated Cefepime without issues  ??  EBV:  - IgM Ab + 01/05/20 but viral load 01/05/20 not detected.     GI:??  GERD prophylaxis:  - Pepcid BID  ??  Nausea:  - Anti-emetics per BMT protocol.   ??  Mucositis (chemotherapy related):  - Improved, stop PCA 10/4, will have oral oxy or prn iv dilaudid  ??  Renal:   AKI:   -02/23/20: SCr up to 1.3 today. Likely from supratherapeutic tacrolimus level. Will give a liter of IV fluids and pharmacy will decrease tac dose.   ??  FEN:  - Lasix 20 mg iv once 10/2   ??  Electrolytes:  - Replacing mag and potassium per standing orders  - 30 mmol of K phos given 10/4, repeat on 10/5 3.2  -02/23/20: Discharging on oral potassium and magnesium chelate  ??  Supplements: Held on admission  - B complex, Vitamin D  ??  Hepatic:??  - No active issues.   - VOD prophylaxis with Actigall TID.     CV:  HTN: New, possibly tac related  - Amlodipine 5mg  daily, increased to 10 mg 9/28 for persistent hypertension.   -02/23/20: Decreased norvasc back to 5 mg daily.     Pulm: DLCO 62%  - Former smoker, quit 01/12/20. Using nicotine patch currently. Has chronic dry cough but PFTs were acceptable.   07/24/19: CT chest w/ 0.6cm RUL nodules, no clear etiology.     ** Discussed with Dr. Oswald Hillock, ok to move forward, no plans to repeat unless new findings.       Neuro/Pain:??  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   ??  Mucositis: (chemotherapy induced)   Grade 3,  Dilaudid PCA (AKI so Dilaudid chosen over Morphine)   - 9/23 started with 0.5 mg PCA dose q 15 min prn, titrate up as needed  - 9/27: Increased bolus to 0.75 mg q98min  -9/28: Decreased back to 0.5 dosing per patient request as sleeping too frequently  -9/30: Decrease to 0.25 for pt request with improvment   -10/4: Stopped PCA, will transition back to oral oxy 10 mg po q 4 hr prn, will also have iv prn dilaudid for severe pain  -10/5: Received oxy this morning with good relief.     Psychosocial:??  - Substance abuse (opioid history). Sober for 5 years.  - Followed by Frederik Schmidt in [facility], placed referral for [facility] while admitted, are not seeing inpatients, will follow- up with him post discharge.   - Insomnia: Melatonin??3mg  and Trazodone 50mg  nightly prn.   - Anxiety: Hospitalization related. Well controlled.     ** Klonopin 0.5/1mg  daily, at discharge will stop morning dose and keep evening dose.   ??  Summary: Plan as above D+21  - Discharge to home today  - Return to clinic on Friday for labs, pharmacy, APP and infusion (all appointments requested).    Labs for first clinic visit entered: Yes    Condition at Discharge: good    I spent greater than 30 minutes in the discharge of this patient.    Kathlee Nations Enoc Getter, ANP   [facility]  Discharge Medications:      Your Medication List      STOP taking these medications    ibuprofen 200 MG tablet  Commonly known as: ADVIL,MOTRIN     levETIRAcetam 1000 MG tablet  Commonly known as: KEPPRA        START taking these medications    amLODIPine 5 MG tablet  Commonly known as: NORVASC  Take 1 tablet (5 mg total) by mouth daily.     clonazePAM 1 MG tablet  Commonly known as: KlonoPIN  Take 1 tablet (1 mg total) by mouth nightly.     famotidine 20 MG tablet  Commonly known as: PEPCID  Take 1 tablet (20 mg total) by mouth Two (2) times a day.     fluconazole 200 MG tablet  Commonly known as: DIFLUCAN  Take 2 tablets (400 mg total) by mouth daily.     letermovir 480 mg tablet  Commonly known as: PREVYMIS  Take 1 tablet (480 mg total) by mouth daily.     magnesium (amino acid chelate) 133 mg Tab  Generic drug: magnesium oxide-Mg AA chelate  Take 2 tablets by mouth two (2) times a day.     oxyCODONE 10 mg immediate release tablet  Commonly known as: ROXICODONE  Take 1 tablet (10 mg total) by mouth every four (4) hours as  needed for up to 7 days.     potassium chloride 20 MEQ CR tablet  Commonly known as: KLOR-CON  Take 1 tablet (20 mEq total) by mouth Two (2) times a day.     tacrolimus 0.5 MG capsule  Commonly known as: PROGRAF  Take 5 capsules (2.5 mg total) by mouth two (2) times a day.     ursodioL 300 mg capsule  Commonly known as: ACTIGALL  Take 1 capsule (300 mg total) by mouth Three (3) times a day.        CONTINUE taking these medications    b complex vitamins capsule  Take 1 capsule by mouth daily.     melatonin 1 mg Tab tablet  Take 2 mg by mouth nightly.     nicotine 21 mg/24 hr patch  Commonly known as: NICODERM CQ  Place 1 patch on the skin daily.     pregabalin 75 MG capsule  Commonly known as: LYRICA  Take 1 capsule (75 mg total) by mouth Two (2) times a day.     prochlorperazine 10 MG tablet  Commonly known as: COMPAZINE  TAKE 1 TABLET (10 MG TOTAL) BY MOUTH EVERY SIX (6) HOURS AS NEEDED FOR NAUSEA.     traZODone 50 MG tablet  Commonly known as: DESYREL  Take 1 tablet (50 mg total) by mouth nightly as needed for sleep (insomnia).     valACYclovir 500 MG tablet  Commonly known as: VALTREX  Take 1 tablet (500 mg total) by mouth daily.     VITAMIN D3 25 mcg (1,000 unit) capsule  Generic drug: cholecalciferol (vitamin D3-25 mcg (1,000 unit))  Take 25 mcg by mouth daily.            Pending Test Results:   Pending Labs     Order Current Status    Tacrolimus Level, Trough In process          Discharge Instructions:     Other Instructions     Discharge instructions      Take your temperature two times a day. If it is more than 100.4, call the BMT clinic or go to your local emergency room. This may be a medical emergency. Inform your provider that you recently received chemotherapy and had a stem cell transplant. You may have blood drawn for blood cultures and receive IV antibiotics.    Diet: No fresh fruits, fresh vegetables, or uncooked pepper until you are instructed to do otherwise by your clinic provider.    Activity: Increase your activity as tolerated.     Central line site: Change your dressing once a week or if it becomes dirty, gets wet or pulls up. Call the clinic (or inpatient unit if after hours) if your catheter site is red, warm, swollen, or tender. These may be signs of infection.    Symptom management: Call the clinic (or inpatient unit if after hours) if you have nausea, vomiting, diarrhea, or pain that is not controlled with your medications. Also call if you develop a new rash.    Please call the clinic (or inpatient unit if after hours) if you have any questions or concerns.    --------  For appointments & questions Monday through Friday 8 AM- 4:30 PM   please call [phone] or Toll free [phone].    On Nights, Weekends and Holidays  Call [phone] and ask for the BMT Physician on call.    [facility]  [address]  [place], [place]  [address]  [email or web address]         Take your temperature two times a day. If it is more than 100.4, call the BMT clinic or go to your local emergency room. This may be a medical emergency. Inform your provider that you recently received chemotherapy and had a stem cell transplant. You may have blood drawn for blood cultures and receive IV antibiotics.    Diet: No fresh fruits, fresh vegetables, or uncooked pepper until you are instructed to do otherwise by your clinic provider.    Activity: Increase your activity as tolerated.     Central line site: Change your dressing once a week or if it becomes dirty, gets wet or pulls up. Call the clinic (or inpatient unit if after hours) if your catheter site is red, warm, swollen, or tender. These may be signs of infection.    Symptom management: Call the clinic (or inpatient unit if after hours) if you have nausea, vomiting, diarrhea, or pain that is not controlled with your medications. Also call if you develop a new rash.    Please call the clinic (or inpatient unit if after hours) if you have any questions or concerns.    --------  For appointments & questions Monday through Friday 8 AM- 4:30 PM   please call [phone] or Toll free [phone].    On Nights, Weekends and Holidays  Call [phone] and ask for the BMT Physician on call.    [facility]  [address]  [place], [place] [address]  [email or web address]        Follow Up instructions and Outpatient Referrals     Comprehensive Metabolic Panel      Is this a fasting order?: No     CMP contains the following tests: NA, K, CL, CO2, BUN, CR, GLUC, CA, Albumin, Total Protein, Total Bilirubin, AST, ALT, and Alkaline Phosphatase.     Magnesium Level      CBC w/ Differential      Discharge instructions      Take your temperature two times a day. If it is more than 100.4, call the BMT clinic or go to your local emergency room. This may be a medical emergency. Inform your provider that you recently received chemotherapy and had a stem cell transplant. You may have blood drawn for blood cultures and receive IV antibiotics.    Diet: No fresh fruits, fresh vegetables, or uncooked pepper until you are instructed to do otherwise by your clinic provider.    Activity: Increase your activity as tolerated.     Central line site: Change your dressing once a week or if it becomes dirty, gets wet or pulls up. Call the clinic (or inpatient unit if after hours) if your catheter site is red, warm, swollen, or tender. These may be signs of infection.    Symptom management: Call the clinic (or inpatient unit if after hours) if you have nausea, vomiting, diarrhea, or pain that is not controlled with your medications. Also call if you develop a new rash.    Please call the clinic (or inpatient unit if after hours) if you have any questions or concerns.    --------  For appointments & questions Monday through Friday 8 AM- 4:30 PM   please call [phone] or Toll free [phone].    On Nights, Weekends and Holidays  Call [phone] and ask for the BMT Physician on call.    [facility]  [address]  [place], [place]  [address]  [email or web address]    CBC w/ Differential      CMV DNA, quantitative, PCR      Comprehensive Metabolic Panel      Is this a fasting order?: No     CMP contains the following tests: NA, K, CL, CO2, BUN, CR, GLUC, CA, Albumin, Total Protein, Total Bilirubin, AST, ALT, and Alkaline Phosphatase.     Magnesium Level      Tacrolimus Level, Trough          Appointments which have been scheduled for you    Feb 25, 2020  7:15 AM  (Arrive by 6:45 AM)  BMT LAB with ONCBMT LABS  [facility] BMT [facility] REGION) [address]  [place] [address]  [phone]      Feb 25, 2020  8:00 AM  (Arrive by 7:30 AM)  RETURN  COMPLEX with [facility]  [facility] BMT [place] [facility] REGION) [address]  [place] [address]  [phone]      Feb 25, 2020  9:15 AM  (Arrive by 8:45 AM)  RETURN FOLLOW UP [place] with ONCBMT APP B  [facility] BMT [place] [facility] REGION) [address]  [place] [address]  [phone]      Feb 28, 2020 12:30 PM  (Arrive by 12:00 PM)  BMT LAB with Carnella Guadalajara  [facility] BMT [place] [facility] REGION) [address]  [place] [address]  [phone]      Feb 28, 2020  1:00 PM  (Arrive by 12:30 PM)  RETURN FOLLOW UP [place] with ONCBMT APP A  [facility] BMT [place] [facility] REGION) [address]  [place] [address]  [phone]      Mar 01, 2020 10:45 AM  (Arrive by 10:15 AM)  BMT LAB with Carnella Guadalajara  [facility] BMT [place] [facility] REGION) [address]  [place] [address]  [phone]      Mar 01, 2020 11:15 AM  (Arrive by 10:45 AM)  RETURN FOLLOW UP [place] with ONCBMT APP B  [facility] BMT [place] [facility] REGION) [address]  [place] [address]  [phone]      Mar 03, 2020  7:45 AM  (Arrive by 7:15 AM)  BMT LAB with Carnella Guadalajara  [facility] BMT [place] [facility] REGION) [address]  [place] [address]  [phone]      Mar 03, 2020  8:15 AM  (Arrive by 7:45 AM)  RETURN FOLLOW UP [place] with ONCBMT APP B  [facility] BMT [place] [facility] REGION) [address]  [place] [address]  [phone]      Mar 07, 2020  7:30 AM  (Arrive by 7:00 AM)  BMT LAB with Carnella Guadalajara  [facility] BMT [place] [facility] REGION) [address]  [place] [address]  [phone]      Mar 07, 2020  8:00 AM  (Arrive by 7:30 AM)  RETURN FOLLOW UP [place] with ONCBMT APP A  [facility] BMT [facility] REGION) [address]  [place] [address]  [phone]      Mar 10, 2020  7:30 AM  (Arrive by 7:00 AM)  BMT LAB with Carnella Guadalajara  [facility] BMT [place] [facility] REGION) [address]  [place] [address]  [phone]  Mar 10, 2020  8:00 AM  (Arrive by 7:30 AM)  RETURN FOLLOW UP [place] with ONCBMT APP A  [facility] BMT [facility] REGION) [address]  [place] [address]  [phone]      Mar 15, 2020  9:15 AM  (Arrive by 8:45 AM)  BMT LAB with Carnella Guadalajara  [facility] BMT [place] [facility] REGION) [address]  [place] [address]  [phone]      Mar 15, 2020  9:45 AM  (Arrive by 9:15 AM)  RETURN FOLLOW UP [place] with Artelia Laroche, MD  [facility] BMT [place] [facility] REGION) [address]  [place] [address]  [phone]      Apr 11, 2020  8:15 AM  (Arrive by 7:45 AM)  BMT LAB with Carnella Guadalajara  [facility] BMT [place] [facility] REGION) [address]  [place] [address]  [phone]      Apr 11, 2020  8:45 AM  (Arrive by 8:15 AM)  RETURN FOLLOW UP [place] with Artelia Laroche, MD  [facility] BMT [place] [facility] REGION) [address]  [place] [address]  [phone]      Apr 26, 2020  7:45 AM  (Arrive by 7:15 AM)  BMT LAB with Carnella Guadalajara  [facility] BMT [place] [facility] REGION) [address]  [place] [address]  [phone]      Apr 26, 2020  8:15 AM  (Arrive by 7:45 AM)  RETURN FOLLOW UP [place] with ONCBMT APP B  [facility] BMT [place] [facility] REGION) [address]  [place] [address]  [phone]      Apr 26, 2020  9:30 AM  (Arrive by 9:00 AM)  BONE MARROW BIOPSY with Bernerd Limbo, FNP  [facility] ONCOLOGY INFUSION [place] [facility] REGION) [address]  [place] [address]  [phone]      May 03, 2020  9:15 AM  (Arrive by 8:45 AM)  BMT LAB with Carnella Guadalajara  [facility] BMT [place] [facility] REGION) [address]  [place] [address]  [phone]      May 03, 2020  9:45 AM  (Arrive by 9:15 AM)  RETURN FOLLOW UP [place] with Artelia Laroche, MD  [facility] BMT [place] [facility] REGION) [address]  [place] [address]  [phone]

## 2020-02-23 NOTE — Unmapped (Signed)
Day +21 from [facility] S[facility]. VSS throughout shift, remains afebrile. Denies nausea, vomiting and diarrhea. Pt reporting pain r/t mucositis, relieved by oxycodone.  Adequate I&O this shift. No further concerns this shift. WCTM.    Vitals:    02/22/20 1525 02/22/20 2020 02/22/20 2357 02/23/20 0500   BP: 118/79 122/83 100/76 111/78   Pulse: 116 109 105 109   Resp: 16 16 16 18    Temp: 37 ??C (98.6 ??F) 37 ??C (98.6 ??F) 37.2 ??C (99 ??F) 37.3 ??C (99.2 ??F)   TempSrc: Oral Oral Oral Oral   SpO2: 99% 96% 95% 96%   Weight:  78.7 kg (173 lb 9.6 oz)     Height:           Problem: Adult Inpatient Plan of Care  Goal: Plan of Care Review  Outcome: Ongoing - Unchanged  Goal: Patient-Specific Goal (Individualized)  Outcome: Ongoing - Unchanged  Goal: Absence of Hospital-Acquired Illness or Injury  Outcome: Ongoing - Unchanged  Intervention: Identify and Manage Fall Risk  Recent Flowsheet Documentation  Taken 02/22/2020 2020 by Arvin Collard, RN  Safety Interventions:   bleeding precautions   environmental modification   low bed   neutropenic precautions   nonskid shoes/slippers when out of bed  Intervention: Prevent Infection  Recent Flowsheet Documentation  Taken 02/22/2020 2020 by Arvin Collard, RN  Infection Prevention:   hand hygiene promoted   equipment surfaces disinfected   environmental surveillance performed  Goal: Optimal Comfort and Wellbeing  Outcome: Ongoing - Unchanged  Goal: Readiness for Transition of Care  Outcome: Ongoing - Unchanged  Goal: Rounds/Family Conference  Outcome: Ongoing - Unchanged     Problem: Infection  Goal: Absence of Infection Signs and Symptoms  Outcome: Ongoing - Unchanged  Intervention: Prevent or Manage Infection  Recent Flowsheet Documentation  Taken 02/22/2020 2020 by Arvin Collard, RN  Infection Management: aseptic technique maintained  Isolation Precautions: protective precautions maintained     Problem: Anemia (Chemotherapy Effects)  Goal: Anemia Symptom Improvement  Outcome: Ongoing - Unchanged  Intervention: Monitor and Manage Anemia  Recent Flowsheet Documentation  Taken 02/22/2020 2020 by Arvin Collard, RN  Safety Interventions:   bleeding precautions   environmental modification   low bed   neutropenic precautions   nonskid shoes/slippers when out of bed     Problem: Urinary Bleeding Risk or Actual (Chemotherapy Effects)  Goal: Absence of Hematuria  Outcome: Ongoing - Unchanged     Problem: Nausea and Vomiting (Chemotherapy Effects)  Goal: Fluid and Electrolyte Balance  Outcome: Ongoing - Unchanged     Problem: Neurotoxicity (Chemotherapy Effects)  Goal: Neurotoxicity Symptom Control  Outcome: Ongoing - Unchanged     Problem: Neutropenia (Chemotherapy Effects)  Goal: Absence of Infection  Outcome: Ongoing - Unchanged  Intervention: Prevent Infection and Maximize Resistance  Recent Flowsheet Documentation  Taken 02/22/2020 2020 by Arvin Collard, RN  Infection Prevention:   hand hygiene promoted   equipment surfaces disinfected   environmental surveillance performed     Problem: Oral Mucositis (Chemotherapy Effects)  Goal: Improved Oral Mucous Membrane Integrity  Outcome: Ongoing - Unchanged     Problem: Thrombocytopenia Bleeding Risk (Chemotherapy Effects)  Goal: Absence of Bleeding  Outcome: Ongoing - Unchanged  Intervention: Minimize Bleeding Risk  Recent Flowsheet Documentation  Taken 02/22/2020 2020 by Arvin Collard, RN  Bleeding Precautions:   blood pressure closely monitored   monitored for signs of bleeding

## 2020-02-24 NOTE — Unmapped (Signed)
[facility] Specialty Medication Onboarding    Specialty Medication: Prevymis 480mg  tablets  Prior Authorization: Approved   Financial Assistance: Yes - grant approved as secondary   Final Copay/Day Supply: $0 / 28 days    Insurance Restrictions: Yes - max 1 month supply     Notes to Pharmacist: Per RPH: Waiting to see if there is a copay to card to bill secondary, then grant tertiary    The triage team has completed the benefits investigation and has determined that the patient is able to fill this medication at [facility]. Please contact the patient to complete the onboarding or follow up with the prescribing physician as needed.

## 2020-02-25 ENCOUNTER — Encounter: Admit: 2020-02-25 | Discharge: 2020-02-26 | Payer: PRIVATE HEALTH INSURANCE | Attending: Oncology | Primary: Oncology

## 2020-02-25 ENCOUNTER — Encounter: Admit: 2020-02-25 | Discharge: 2020-02-26 | Payer: PRIVATE HEALTH INSURANCE

## 2020-02-25 ENCOUNTER — Other Ambulatory Visit: Admit: 2020-02-25 | Discharge: 2020-02-26 | Payer: PRIVATE HEALTH INSURANCE

## 2020-02-25 DIAGNOSIS — Z9481 Bone marrow transplant status: Principal | ICD-10-CM

## 2020-02-25 DIAGNOSIS — C9201 Acute myeloblastic leukemia, in remission: Principal | ICD-10-CM

## 2020-02-25 DIAGNOSIS — Z9484 Stem cells transplant status: Principal | ICD-10-CM

## 2020-02-25 LAB — COMPREHENSIVE METABOLIC PANEL
ALBUMIN: 2.8 g/dL — ABNORMAL LOW (ref 3.4–5.0)
ALKALINE PHOSPHATASE: 98 U/L (ref 46–116)
ANION GAP: 5 mmol/L (ref 5–14)
AST (SGOT): 12 U/L (ref <=34)
BILIRUBIN TOTAL: 0.4 mg/dL (ref 0.3–1.2)
BLOOD UREA NITROGEN: 15 mg/dL (ref 9–23)
CALCIUM: 8.7 mg/dL (ref 8.7–10.4)
CHLORIDE: 103 mmol/L (ref 98–107)
CO2: 27 mmol/L (ref 20.0–31.0)
CREATININE: 1.82 mg/dL — ABNORMAL HIGH
EGFR CKD-EPI AA MALE: 47 mL/min/{1.73_m2} — ABNORMAL LOW (ref >=60)
EGFR CKD-EPI NON-AA MALE: 40 mL/min/{1.73_m2} — ABNORMAL LOW (ref >=60)
GLUCOSE RANDOM: 152 mg/dL (ref 70–179)
POTASSIUM: 5 mmol/L — ABNORMAL HIGH (ref 3.4–4.5)
PROTEIN TOTAL: 5.2 g/dL — ABNORMAL LOW (ref 5.7–8.2)
SODIUM: 135 mmol/L (ref 135–145)

## 2020-02-25 LAB — MANUAL DIFFERENTIAL
BASOPHILS - ABS (DIFF): 0 10*9/L (ref 0.0–0.1)
BASOPHILS - REL (DIFF): 0 %
EOSINOPHILS - REL (DIFF): 0 %
LYMPHOCYTES - ABS (DIFF): 1.1 10*9/L — ABNORMAL LOW (ref 1.5–5.0)
LYMPHOCYTES - REL (DIFF): 19 %
MONOCYTES - ABS (DIFF): 1.2 10*9/L — ABNORMAL HIGH (ref 0.2–0.8)
NEUTROPHILS - ABS (DIFF): 3.4 10*9/L (ref 2.0–7.5)
NEUTROPHILS - REL (DIFF): 60 %

## 2020-02-25 LAB — CBC W/ AUTO DIFF
HEMATOCRIT: 26 % — ABNORMAL LOW (ref 41.0–53.0)
HEMOGLOBIN: 8.4 g/dL — ABNORMAL LOW (ref 13.5–17.5)
MEAN CORPUSCULAR HEMOGLOBIN CONC: 32.5 g/dL (ref 31.0–37.0)
MEAN CORPUSCULAR HEMOGLOBIN: 37.2 pg — ABNORMAL HIGH (ref 26.0–34.0)
MEAN CORPUSCULAR VOLUME: 114.5 fL — ABNORMAL HIGH (ref 80.0–100.0)
MEAN PLATELET VOLUME: 11.2 fL — ABNORMAL HIGH (ref 7.0–10.0)
PLATELET COUNT: 46 10*9/L — ABNORMAL LOW (ref 150–440)
RED BLOOD CELL COUNT: 2.27 10*12/L — ABNORMAL LOW (ref 4.50–5.90)
RED CELL DISTRIBUTION WIDTH: 19.6 % — ABNORMAL HIGH (ref 12.0–15.0)
WBC ADJUSTED: 5.7 10*9/L (ref 4.5–11.0)

## 2020-02-25 LAB — WBC ADJUSTED: Leukocytes:NCnc:Pt:Bld:Qn:: 5.7

## 2020-02-25 LAB — EOSINOPHILS - REL (DIFF): Eosinophils/100 leukocytes:NFr:Pt:Bld:Qn:Manual count: 0

## 2020-02-25 LAB — EGFR CKD-EPI AA MALE
Glomerular filtration rate/1.73 sq M.predicted.black:ArVRat:Pt:Ser/Plas/Bld:Qn:Creatinine-based formula (CKD-EPI): 47 — ABNORMAL LOW

## 2020-02-25 LAB — TACROLIMUS, TROUGH: Lab: 10.2

## 2020-02-25 LAB — MAGNESIUM: Magnesium:MCnc:Pt:Ser/Plas:Qn:: 1.5 — ABNORMAL LOW

## 2020-02-25 MED ORDER — TACROLIMUS 0.5 MG CAPSULE, IMMEDIATE-RELEASE
ORAL_CAPSULE | Freq: Two times a day (BID) | ORAL | 5 refills | 30.00000 days | Status: CP
Start: 2020-02-25 — End: ?

## 2020-02-25 MED ADMIN — heparin, porcine (PF) 100 unit/mL injection 500 Units: 500 [IU] | INTRAVENOUS | @ 14:00:00 | Stop: 2020-02-26

## 2020-02-25 MED ADMIN — magnesium sulfate 2gm/50mL IVPB: 2 g | INTRAVENOUS | @ 14:00:00 | Stop: 2020-02-25

## 2020-02-25 MED ADMIN — heparin, porcine (PF) 100 unit/mL injection 500 Units: 500 [IU] | INTRAVENOUS | @ 12:00:00 | Stop: 2020-02-25

## 2020-02-25 MED ADMIN — letermovir (PREVYMIS) tablet 480 mg: 480 mg | ORAL | @ 15:00:00 | Stop: 2020-02-25

## 2020-02-25 MED ADMIN — sodium chloride 0.9% (NS) bolus 1,000 mL: 1000 mL | INTRAVENOUS | @ 14:00:00 | Stop: 2020-02-25

## 2020-02-25 MED FILL — PREVYMIS 480 MG TABLET: 28 days supply | Qty: 28 | Fill #0 | Status: AC

## 2020-02-25 NOTE — Unmapped (Signed)
Pharmacy Post-Discharge Assessment:    [name] is a 59 y.o. 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  Their transplant course was complicated by severe mucositis requiring PCA, and culture negative neutropenic fever. The patient was discharged on 02/23/20 and arrives in clinic today for his first oupatient follow up visit.    Interval History: Today, [name] is feeling well but tired.  He denies fever, chills, diarrhea, SOB, cough, and presence of a rash.  He has some nausea but he attributes this to taking his pills on an empty stomach.  He finds that eating prior to pills helps.  He has eaten some cheeseburger, BBQ, and spaghetti-o's but only a few bites of each.  His oral mucositis is improving but he is still requiring 3-4 doses of PRN oxycodone each day.  He is sleeping very well now that he is back home but he remains very tired.      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,XY[1]    Variants of Known or Likely Clinical Significance  Gene Transcript  Predicted Protein  VAF (%)   CEBPA c.[id number] p.Ser251Argfs 41      Variants of Unknown Significance  Gene Transcript Predicted Protein VAF (%)   TET2 c.4946A>G p.[id number] 49      -  FLT-3-ITD and FLT-3-TKD studies are negative    Pertinent Phenotypic data:    Disease-specific prognostic estimate:     Induction:   7+3+HD Dauno (C1D1 2/24)    Recovery Marrow:    Final Diagnosis   Date Value Ref Range Status   08/12/2019   Final    Bone marrow, right iliac, aspiration and biopsy  -  Normocellular bone marrow (60%) with trilineage hematopoiesis and 4% blasts by manual aspirate differential  -  Routine cytogenetic analysis is pending  - Flow cytometric MRD analysis is pending    This electronic signature is attestation that the pathologist personally reviewed the submitted material(s) and the final diagnosis reflects that evaluation.            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,XY[1]  Variants of Known or Likely Clinical Significance  Gene Transcript  Predicted Protein  VAF (%)   CEBPA c.753_762del p.Ser251Argfs 41      Variants of Unknown Significance  Gene Transcript Predicted Protein VAF (%)   TET2 c.4946A>G p.Tyr1649Cys 49      -  FLT-3-ITD and FLT-3-TKD studies are negative     07/09/2019 Initial Diagnosis    AML (acute myeloid leukemia) (CMS-HCC)  07/14/2019 - 07/20/2019 Chemotherapy    IP LEUKEMIA 7+3 HIGH DOSE DAUNOrubicin  DAUNOrubicin 90 mg/m2 IV on Days 1, 2, 3  Cytarabine 100 mg/m2 CIVI on Days 1 to 7     07/27/2019 Biopsy    Bone marrow, right iliac, aspiration and biopsy  -  Hypocellular bone marrow (<5%) with treatment effect, markedly reduced trilineage hematopoiesis, and 3% blasts by manual aspirate differential (see Comment).     08/24/2019 - 09/28/2019 Chemotherapy    IP LEUKEMIA HIGH DOSE CYTARABINE CONSOLIDATION (3 G/M2)  cytarabine 3 g/m2 every 12 hours on days 1, 3 and 5 every 28 days     08/24/2019 - 12/26/2019 Chemotherapy    IP LEUKEMIA HIGH DOSE CYTARABINE CONSOLIDATION (3 G/M2) ON DAYS 1,2,3  cytarabine 3 g/m2 IV every 12 hours on days 1, 2, 3, every 28 days     01/19/2020 - 01/19/2020 Chemotherapy    BMT OP BUSULFAN TEST DOSE  Busulfan 0.8 mg/kg IV ONCE     01/26/2020 -  Chemotherapy    BMT IP MAC BUSULFAN / FLUDARABINE / rATG (MUD)   Busulfan IV Days -6 to -3  Fludarabine 40 mg/m2 IV Days -6 to -3  rATG 0.5 mg/kg IV Day -3, 1.5 mg/kg IV Day -2, 2.5 mg/kg IV Day -1         Current Outpatient Medications   Medication Sig Dispense Refill   ??? amLODIPine (NORVASC) 5 MG tablet Take 1 tablet (5 mg total) by mouth daily. 30 tablet 5   ??? b complex vitamins capsule Take 1 capsule by mouth daily.     ??? cholecalciferol, vitamin D3-25 mcg, 1,000 unit,, (VITAMIN D3) 25 mcg (1,000 unit) capsule Take 25 mcg by mouth daily.     ??? clonazePAM (KLONOPIN) 1 MG tablet Take 1 tablet (1 mg total) by mouth nightly. 7 tablet 0   ??? famotidine (PEPCID) 20 MG tablet Take 1 tablet (20 mg total) by mouth Two (2) times a day. 60 tablet 2   ??? fluconazole (DIFLUCAN) 200 MG tablet Take 2 tablets (400 mg total) by mouth daily. 60 tablet 2   ??? letermovir (PREVYMIS) 480 mg tablet Take 1 tablet (480 mg total) by mouth daily. 28 tablet 2   ??? magnesium oxide-Mg AA chelate (MAGNESIUM, AMINO ACID CHELATE,) 133 mg Tab Take 2 tablets by mouth two (2) times a day. 120 tablet 3   ??? melatonin 1 mg Tab tablet Take 2 mg by mouth nightly.     ??? nicotine (NICODERM CQ) 21 mg/24 hr patch Place 1 patch on the skin daily. 28 patch 11   ??? oxyCODONE (ROXICODONE) 10 mg immediate release tablet Take 1 tablet (10 mg total) by mouth every four (4) hours as needed for up to 7 days. 42 tablet 0   ??? potassium chloride (KLOR-CON) 20 MEQ CR tablet Take 1 tablet (20 mEq total) by mouth Two (2) times a day. 60 tablet 3   ??? pregabalin (LYRICA) 75 MG capsule Take 1 capsule (75 mg total) by mouth Two (2) times a day. 60 capsule 0   ??? prochlorperazine (COMPAZINE) 10 MG tablet TAKE 1 TABLET (10 MG TOTAL) BY MOUTH EVERY SIX (6) HOURS AS NEEDED FOR NAUSEA. 30 tablet 2   ??? tacrolimus (PROGRAF) 0.5 MG capsule Take 4 capsules (2 mg total) by mouth two (2) times a day. 240 capsule 5   ??? traZODone (DESYREL) 50 MG tablet Take 1 tablet (50 mg total) by mouth nightly as needed for sleep (insomnia). 90  tablet 3   ??? ursodioL (ACTIGALL) 300 mg capsule Take 1 capsule (300 mg total) by mouth Three (3) times a day. 90 capsule 11   ??? valACYclovir (VALTREX) 500 MG tablet Take 1 tablet (500 mg total) by mouth daily. 30 tablet 11     No current facility-administered medications for this visit.       Assessment/Plan:    **BMT  -Currently day +23 s/p transplant.  -WBC: 5.7,  ANC 3.4, Hgb 8.4, PLT 46 (uptrending)    **ID  1. Prophylaxis:  - antibacterial - None indicated   - antifungal - fluconazole 400 mg PO QDay through day + 75  - antiviral - valacyclovir 500 mg PO QDay through day 365  - PCP - dapsone 100 mg PO QDay through completion of immunosuppression - will plan to start Monday  -CMV - Continue letermovir 480 mg po daily through day +100    **FENGI  -Recommend continuing famotidine for 2 weeks following discharge.  At that time, [name] may transition to PRN use or DC if there is no ongoing need.   -Continue PRN use of  compazine for nausea/vomiting  -Continue PRN use of loperamide for diarrhea  -Will DC KCl today given K of 5  -Cont Mag chelate 2 tabs BID - titrate as tolerated  -Cont Vit D and b-complex supplementation    **CV  1. HTN  -Disontinue amlodipine 5 mg po daily - BP today 99/61 and pt c/o lightheadedness (before dose)    **Neuro/pain  -Continue PRN use of oxycodone for pain control - pt states he's using 3-4 doses per day at this time  -Cont nightly trazodone + PRN clonazepam    **[facility]/Bone Health  -30-day DEXA scan ordered   -F/u results when available to determine need for zoledronic acid infusions    Pharmacy Information: [name] had prescriptions filled at [facility] ([phone]). At the time of discharge, there were no issues with acquiring medications, and insurance coverage for all medications was adequate with the exception of high copay for letermovir.  Pt using copay card + grant for his letermovir copays and Rx is coming from [place].    I provided [name] with an updated MedActionPlan at this visit. I would be happy to see [name] in the future as needed for further medication managment issues.    Medications prescribed or ordered upon discharge were reviewed today and reconciled with the most recent outpatient medication list.  Medication reconciliation was conducted by a clinical pharmacist    I spent 25 minutes in direct patient care with this patient    Thank you,    Bettey Costa, PharmD, BCPS, BCOP  BMT Clinical Pharmacist Practitioner

## 2020-02-25 NOTE — Unmapped (Signed)
Bone Marrow Transplant and Cellular Therapy Program  Immunosuppressive Therapy Note    John Erickson is a 59 y.o. 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 John Erickson is currently day 50.    Current dose: 2 mg PO BID (dose decreased on 10/6)    Goal tacrolimus Level: 5-10 ng/mL    Resulted level: 10.2 ng/mL  (caregiver reports holding PM dose on 10/6 as instructed and them going down to 2 mg BID starting yesterday morning)    Lab Results   Component Value Date/Time    TACROLIMUS 10.2 02/25/2020 07:35 AM    TACROLIMUS 11.1 02/23/2020 08:11 AM    TACROLIMUS 8.3 02/21/2020 08:38 AM     Lab Results   Component Value Date/Time    CREATININE 1.82 (H) 02/25/2020 07:35 AM    CREATININE 1.30 02/22/2020 11:59 PM    CREATININE 0.73 02/22/2020 12:02 AM     Tacrolimus level is at the upper end of therapeutic range, however renal function is significantly up and tacrolimus level is likely to trend up in setting or worsening renal function. Plan to decrease tacrolimus dose to 1.5 mg PO twice daily and recheck tacrolimus level and renal function at next clinic visit on 02/28/20.    We will continue to monitor levels.  Patient will be followed for changes in renal and hepatic function, toxicity, and efficacy.     Rulon Abide, PharmD CPP  [facility] Bone Marrow Transplant and Cellular Therapy Program

## 2020-02-25 NOTE — Unmapped (Signed)
Lab on 02/25/2020   Component Date Value Ref Range Status   ??? Sodium 02/25/2020 135  135 - 145 mmol/L Final   ??? Potassium 02/25/2020 5.0* 3.4 - 4.5 mmol/L Final   ??? Chloride 02/25/2020 103  98 - 107 mmol/L Final   ??? Anion Gap 02/25/2020 5  5 - 14 mmol/L Final   ??? CO2 02/25/2020 27.0  20.0 - 31.0 mmol/L Final   ??? BUN 02/25/2020 15  9 - 23 mg/dL Final   ??? Creatinine 02/25/2020 1.82* 0.70 - 1.30 mg/dL Final   ??? BUN/Creatinine Ratio 02/25/2020 8   Final   ??? EGFR CKD-EPI Non-African American,* 02/25/2020 40* >=60 mL/min/1.22m2 Final   ??? EGFR CKD-EPI African American, Male 02/25/2020 47* >=60 mL/min/1.28m2 Final   ??? Glucose 02/25/2020 152  70 - 179 mg/dL Final   ??? Calcium 09/81/1914 8.7  8.7 - 10.4 mg/dL Final   ??? Albumin 78/29/5621 2.8* 3.4 - 5.0 g/dL Final   ??? Total Protein 02/25/2020 5.2* 5.7 - 8.2 g/dL Final   ??? Total Bilirubin 02/25/2020 0.4  0.3 - 1.2 mg/dL Final   ??? AST 30/86/5784 12  <=34 U/L Final   ??? ALT 02/25/2020 11  10 - 49 U/L Final   ??? Alkaline Phosphatase 02/25/2020 98  46 - 116 U/L Final   ??? Magnesium 02/25/2020 1.5* 1.6 - 2.6 mg/dL Final   ??? WBC 69/62/9528 5.7  4.5 - 11.0 10*9/L Final   ??? RBC 02/25/2020 2.27* 4.50 - 5.90 10*12/L Final   ??? HGB 02/25/2020 8.4* 13.5 - 17.5 g/dL Final   ??? HCT 41/32/4401 26.0* 41.0 - 53.0 % Final   ??? MCV 02/25/2020 114.5* 80.0 - 100.0 fL Final   ??? MCH 02/25/2020 37.2* 26.0 - 34.0 pg Final   ??? MCHC 02/25/2020 32.5  31.0 - 37.0 g/dL Final   ??? RDW 02/72/5366 19.6* 12.0 - 15.0 % Final   ??? MPV 02/25/2020 11.2* 7.0 - 10.0 fL Final   ??? Platelet 02/25/2020 46* 150 - 440 10*9/L Final   ??? Variable HGB Concentration 02/25/2020 Slight* Not Present Final   ??? Macrocytosis 02/25/2020 Marked* Not Present Final   ??? Anisocytosis 02/25/2020 Moderate* Not Present Final   ??? Hypochromasia 02/25/2020 Moderate* Not Present Final   ??? Neutrophils % 02/25/2020 60  % Final   ??? Lymphocytes % 02/25/2020 19  % Final   ??? Monocytes % 02/25/2020 21  % Final   ??? Eosinophils % 02/25/2020 0  % Final   ??? Basophils % 02/25/2020 0  % Final   ??? Absolute Neutrophils 02/25/2020 3.4  2.0 - 7.5 10*9/L Final   ??? Absolute Lymphocytes 02/25/2020 1.1* 1.5 - 5.0 10*9/L Final   ??? Absolute Monocytes 02/25/2020 1.2* 0.2 - 0.8 10*9/L Final   ??? Absolute Eosinophils 02/25/2020 0.0  0.0 - 0.4 10*9/L Final   ??? Absolute Basophils 02/25/2020 0.0  0.0 - 0.1 10*9/L Final   ??? Smear Review Comments 02/25/2020 See Comment* Undefined Final    Slide reviewed.   ??? Toxic Granulation 02/25/2020 Present* Not Present Final   ??? Toxic Vacuolation 02/25/2020 Present* Not Present Final

## 2020-02-25 NOTE — Unmapped (Addendum)
Instructions:   - A BMT pharmacist will call you if I need to make changes to your tacrolimus dose.   - Drink lots of fluids over the weekend  - Stop the potassium tablets  - We will give you some fluids today to help hydrate     Return to clinic on Monday to see one of the providers.     Covid:   Given ongoing novel coronavirus (COVID-19), it is strongly recommended to avoid travel and crowds.  If you develop fever, cough, shortness of breath and/or have known exposure (close contact < 3 feet) with someone who has tested positive, please notify us immediately.    ??? Your best defense is to stay home as much as possible and use good hand hygiene.    For prescription refills:   For refills, please check your medication bottles to see if you have additional refills left. If so, please call your pharmacy and follow the directions to request a refill. If you do not have any refills left, please make a request during your clinic visit or by submitting a request through MyChart or by calling [phone]. Please allow 24 hours if your request is made during the week or 48 hours if requests are made on the weekends or holidays.     Labs:   Lab Results   Component Value Date    WBC 5.7 02/25/2020    HGB 8.4 (L) 02/25/2020    HCT 26.0 (L) 02/25/2020    PLT 46 (L) 02/25/2020     Lab Results   Component Value Date    NA 135 02/25/2020    K 5.0 (H) 02/25/2020    CL 103 02/25/2020    CO2 27.0 02/25/2020    BUN 15 02/25/2020    CREATININE 1.82 (H) 02/25/2020    GLU 152 02/25/2020    CALCIUM 8.7 02/25/2020    MG 1.5 (L) 02/25/2020    PHOS 4.1 02/22/2020     Lab Results   Component Value Date    BILITOT 0.4 02/25/2020    BILIDIR 0.30 02/21/2020    PROT 5.2 (L) 02/25/2020    ALBUMIN 2.8 (L) 02/25/2020    ALT 11 02/25/2020    AST 12 02/25/2020    ALKPHOS 98 02/25/2020    GGT 21 01/26/2020     Lab Results   Component Value Date    INR 1.21 02/22/2020    APTT 33.2 02/17/2020 --------------------------------------------------------------------------------------------------------------------  For appointments & questions Monday through Friday 8 AM-4:30 PM     Please call [phone] or Toll free [phone]    On Nights, Weekends and Holidays  Call [phone] and ask for the oncologist on call    Please visit PrivacyFever[employer]cz, a resource created just for family members and caregivers.  This website lists support services, how and where to ask for help. It has tools to assist you as you help [place] care for your loved one.    [facility]  [address]  [place], [place] [address]  [email or web address]

## 2020-02-25 NOTE — Unmapped (Signed)
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    Interval History:   John Erickson reports feeling fairly well this morning. He still has some oral pain but says that oxycodone has been effective for him. Was able to eat soup a few times yesterday, able to take all of his pills and took in 2.2 liters. Asymptomatic hypotension this morning. Had already received this morning's norvasc dose but will decrease subsequent doses to 5 mg daily. SCr also up this morning to 1.3. Given lower BP and decreased kidney function, will give 1 liter of IV fluids prior to discharge. No n/v/d or rash. Walked another 22 laps yesterday and has now completed 2 marathons!    ROS:  Comprehensive ROS negative except pertinent positives listed in interval history.     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,XY[1]    Variants of Known or Likely Clinical Significance  Gene Transcript  Predicted Protein  VAF (%)   CEBPA c.753_762del p.Ser251Argfs 41      Variants of Unknown Significance  Gene Transcript Predicted Protein VAF (%)   TET2 c.4946A>G p.Tyr1649Cys 49      -  FLT-3-ITD and FLT-3-TKD studies are negative    Pertinent Phenotypic data:    Disease-specific prognostic estimate:     Induction:   7+3+HD Dauno (C1D1 2/24)    Recovery Marrow:    Final Diagnosis   Date Value Ref Range Status   08/12/2019   Final    Bone marrow, right iliac, aspiration and biopsy  -  Normocellular bone marrow (60%) with trilineage hematopoiesis and 4% blasts by manual aspirate differential  -  Routine cytogenetic analysis is pending  -  Flow cytometric MRD analysis is pending    This electronic signature is attestation that the pathologist personally reviewed the submitted material(s) and the final diagnosis reflects that evaluation.            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,XY[1]  Variants of Known or  Likely Clinical Significance  Gene Transcript  Predicted Protein  VAF (%)   CEBPA c.753_762del p.Ser251Argfs 41      Variants of Unknown Significance  Gene Transcript Predicted Protein VAF (%)   TET2 c.4946A>G p.Tyr1649Cys 49      -  FLT-3-ITD and FLT-3-TKD studies are negative     07/09/2019 Initial Diagnosis    AML (acute myeloid leukemia) (CMS-HCC)     07/14/2019 - 07/20/2019 Chemotherapy    IP LEUKEMIA 7+3 HIGH DOSE DAUNOrubicin  DAUNOrubicin 90 mg/m2 IV on Days 1, 2, 3  Cytarabine 100 mg/m2 CIVI on Days 1 to 7     07/27/2019 Biopsy    Bone marrow, right iliac, aspiration and biopsy  -  Hypocellular bone marrow (<5%) with treatment effect, markedly reduced trilineage hematopoiesis, and 3% blasts by manual aspirate differential (see Comment).     08/24/2019 - 09/28/2019 Chemotherapy    IP LEUKEMIA HIGH DOSE CYTARABINE CONSOLIDATION (3 G/M2)  cytarabine 3 g/m2 every 12 hours on days 1, 3 and 5 every 28 days     08/24/2019 - 12/26/2019 Chemotherapy    IP LEUKEMIA HIGH DOSE CYTARABINE CONSOLIDATION (3 G/M2) ON DAYS 1,2,3  cytarabine 3 g/m2 IV every 12 hours on days 1, 2, 3, every 28 days     01/19/2020 - 01/19/2020 Chemotherapy    BMT OP BUSULFAN TEST DOSE  Busulfan 0.8 mg/kg IV ONCE     01/26/2020 -  Chemotherapy    BMT IP MAC BUSULFAN / FLUDARABINE / rATG (MUD)   Busulfan IV Days -6 to -3  Fludarabine 40 mg/m2 IV Days -6 to -3  rATG 0.5 mg/kg IV Day -3, 1.5 mg/kg IV Day -2, 2.5 mg/kg IV Day -1       Physical exam:  There were no vitals taken for this visit.  KPS at discharge: 90,  Able to carry on normal activity; minor signs or symptoms of disease (ECOG equivalent 0)    General: No acute distress noted.   Central venous access: Line clean, dry, intact. No erythema or drainage noted.   ENT: Moist mucous membranes, healing mucosa to right and left buccal areas and tongue. Some scabs on lips, no bleeding.   Cardiovascular: Tachycardic, with regular rate and rhythm. S1 and S2 normal, without any murmur, rub, or gallop.  Lungs: Clear to auscultation bilaterally, without wheezes/crackles/rhonchi. Good air movement.   Skin: Warm, dry, intact. No rash noted.    Psychiatry: Alert and oriented to person, place, and time.   Gastrointestinal/Abdomen: Normoactive bowel sounds, abdomen soft, non-tender   Musculoskeletal/Extremities: FROM throughout. Trace edema bilaterally around ankles up to mid shin, improved from the weekend.   Neurologic: CNII-XII intact. Normal strength and sensation throughout    Lab Results   Component Value Date    WBC 3.2 (L) 02/22/2020    HGB 7.6 (L) 02/22/2020    HCT 23.3 (L) 02/22/2020    PLT 34 (L) 02/22/2020     Lab Results   Component Value Date    NA 138 02/22/2020    K 3.7 02/22/2020    CL 106 02/22/2020    CO2 26.0 02/22/2020    BUN 7 (L) 02/22/2020    CREATININE 1.30 02/22/2020    GLU 192 (H) 02/22/2020    CALCIUM 8.1 (L) 02/22/2020    MG 2.3 02/23/2020    PHOS 4.1 02/22/2020     Lab Results   Component Value Date    BILITOT 0.5 02/21/2020    BILIDIR 0.30 02/21/2020  PROT 4.9 (L) 02/21/2020    ALBUMIN 2.7 (L) 02/21/2020    ALT 11 02/21/2020    AST 11 02/21/2020    ALKPHOS 93 02/21/2020    GGT 21 01/26/2020     Lab Results   Component Value Date    PT 14.1 (H) 02/22/2020    INR 1.21 02/22/2020    APTT 33.2 02/17/2020     Hospital Course/Assessment and Plan    BMT:??AML, CR MRD Negative  HCT-CI (age adjusted) 7??(psych, severe pulmonary dysfunction, and age)   Conditioning:??MAC Bu/Flu/ATG  Donor:??9/10, ABO O+, CMV+  Engraftment:??Granix starting D+12 through engraftment (as defined as ANC 1.0 x 2 days or 3.0 x 1 day)  - Date of last granix injection: 02/19/20  ??? Date of neutrophil engraftment (first day of ANC ? 500/mm3 for 3 consecutive days): 02/18/20  ??? Date of platelet engraftment (>20K x3 days without transfusion in 7 days): TBD  ??? Number of RBC infusions during transplant hospitalization: 0 units  ??? Number of platelet infusions during transplant hospitalization: 5 units  ??  GVHD prophylaxis:??  1.Tacrolimus being managed by pharmacy with a goal of 5-10 ng/mL  2. Methotrexate??15 mg/m2 IVP on day +1 then 10 mg/m2 on days +3, +6 and +11  3. ATG per [facility] standard dosing will??be included  ??  Heme:??  Transfusion criteria:??1 unit of PRBCs for Hgb<7 and 1 unit of platelets for Plt <10K or bleeding.   -??No history of transfusion reactions.   ??  Elevated INR (related to nutrition deficit)  - INR 1.7 on 9/30, started PO vit k 5 mg x 3 days  - 02/22/20: INR 1.2    ID:??  Prophylaxis:  - Antiviral: Valtrex 500 mg po daily   - Antifungal: Fluconazole 400 mg po daily, continue through day +75  - PJP: Dapsone (has Sulfa allergy) upon platelet engraftment.   ????** G6PD normal, 8.3  - CMV D+/R+: Letermovir prophy, 480mg  po daily starting D+15 or on hospital discharge (whichever is sooner), continue through D+100   ??  Neutropenic Fever:  02/03/20: Tmax 38.4 Cefepime (02/03/20 - 02/09/20)  - Cultures NGx 5 days  ??  02/13/20: Spiked again at 5am 9/26  - Cultures negative at 5 days  - Started on IV cefepime (9/26- 10/1) and IV flagyl (anaerobic coverage given mucositis) (9/26-10/1). Received a dose of IV vanc but no indication to continue.   ??  Allergy:  - PCN allergy (hives) has tolerated Cefepime without issues  ??  EBV:  - IgM Ab + 01/05/20 but viral load 01/05/20 not detected.     GI:??  GERD prophylaxis:  - Pepcid BID  ??  Nausea:  - Anti-emetics per BMT protocol.   ??  Mucositis (chemotherapy related):  - Improved, stop PCA 10/4, will have oral oxy or prn iv dilaudid  ??  Renal:   AKI:   -02/23/20: SCr up to 1.3 today. Likely from supratherapeutic tacrolimus level. Will give a liter of IV fluids and pharmacy will decrease tac dose.   ??  FEN:  - Lasix 20 mg iv once 10/2   ??  Electrolytes:  - Replacing mag and potassium per standing orders  - 30 mmol of K phos given 10/4, repeat on 10/5 3.2  -02/23/20: Discharging on oral potassium and magnesium chelate  ??  Supplements: Held on admission  - B complex, Vitamin D  ??  Hepatic:??  - No active issues.   - VOD prophylaxis with Actigall TID.     CV:  HTN: New, possibly tac related  - Amlodipine 5mg  daily, increased to 10 mg 9/28 for persistent hypertension.   -02/23/20: Decreased norvasc back to 5 mg daily.     Pulm: DLCO 62%  - Former smoker, quit 01/12/20. Using nicotine patch currently. Has chronic dry cough but PFTs were acceptable.   07/24/19: CT chest w/ 0.6cm RUL nodules, no clear etiology.     ** Discussed with Dr. Oswald Hillock, ok to move forward, no plans to repeat unless new findings.       Neuro/Pain:??  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   ??  Mucositis: (chemotherapy induced)   Grade 3,  Dilaudid PCA (AKI so Dilaudid relief.   -10/8: Resolving, took one tylenol last night and instructed to use oxycodone     Psychosocial:??  - Substance abuse (opioid history). Sober for 5 years.  - Followed by Frederik Schmidt in [facility], placed referral for [facility] while admitted, are not seeing inpatients, will follow- up with him post discharge.   - Insomnia: Melatonin??3mg  and Trazodone 50mg  nightly prn.   - Anxiety: Hospitalization related. Well controlled.     ** Klonopin 0.5/1mg  daily, at discharge will stop morning dose and keep evening dose.   ??  Summary: Plan as above D+23  - RTC Monday  - 1L NS in infusion, Mag 2gm iv, stop potassium, will monitor tac levels closely    Elyse Prevo Elie Confer, [facility]  Physician Assistant  Adult Bone Marrow Transplantation    I personally spent 60 minutes face-to-face and non-face-to-face in the care of this patient, which includes all pre, intra, and post visit time on the date of service.

## 2020-02-25 NOTE — Unmapped (Signed)
SW Outpatient Note:    Service: BMT Clinic    SW received call from Pt and his fiance/caregiver, Cassie (who were present at [facility]), who informed SW that Pt received a letter from [facility] regarding his Co-Pay Assistance Program funds and had questions.    This SW and SW Rockwell [place] (in training) met with Pt and fiance/caregiver in clinic and reviewed the letter from [facility]LS.  The letter provides instructions on how claims can be filed, or funds can be used, for this program.  The letter further states that if a claim for funds is not submitted by 03/19/20, then Pt's account will be closed.  As funds can be used for prescribed medications and insurance premiums, SW discussed with Pt the option for utilizing funds in this manner prior to 03/19/20.  Additionally, SW provided Pt with contact information to [facility] to discuss option of filing claims toward [employer], and SW encouraged Pt and fiance to contact LLS to further discuss options for filing claims prior to 03/19/20.  If there are any further updates and/or issues/concerns around this matter, SW encouraged Pt to follow up.    During meeting, SW conducted post-transplant SW psychosocial check-in.  Pt reports having home health [facility]) for supplies at home, denies any issues/concerns.  Pt reports no issues or changes in  caregiver plan, or support.  Overall Pt reports he is well and is aware that he can contact SW for support.  Additionally, Pt reports that he elected to enroll in health insurance through the [facility] Marketplace as this was a better option financially.     Grace Isaac, LCSW  [facility] Social Work   Phone [phone]  Pager [phone]

## 2020-02-25 NOTE — Unmapped (Signed)
Received referral for outpatient [facility] follow-up from BMT team. Have called pt several times, but voicemail is full and haven't been able to leave a message. Sent pt mychart message with contact information for scheduling follow-up counseling session.     Frederik Schmidt, LCSW  [facility]  Phone: [phone]  Pager: [phone]

## 2020-02-25 NOTE — Unmapped (Signed)
[facility] Specialty Medication Onboarding    Specialty Medication: Prevymis  Prior Authorization: Approved   Financial Assistance: Yes - copay card approved as secondary   Final Copay/Day Supply: $0 / 30    Insurance Restrictions: None     Notes to Pharmacist: copay card secondary, grant tertiary    The triage team has completed the benefits investigation and has determined that the patient is able to fill this medication at [facility]. Please contact the patient to complete the onboarding or follow up with the prescribing physician as needed.

## 2020-02-25 NOTE — Unmapped (Signed)
1100 - Blood return verified in blue and white lumens. White lumen heparinized. IVF and magnesium running in blue lumen. Patient instructed to call [facility] pharmacy to coordinate delivery of his medications. This he did in the infusion center. Infusions currently running without complication. Patient sleeping in chair.    1210 - [name] completed his infusions today without complication and left the infusion center alert and ambulatory.

## 2020-02-28 ENCOUNTER — Encounter: Admit: 2020-02-28 | Discharge: 2020-02-29 | Payer: PRIVATE HEALTH INSURANCE

## 2020-02-28 DIAGNOSIS — R5383 Other fatigue: Principal | ICD-10-CM

## 2020-02-28 DIAGNOSIS — T451X5A Adverse effect of antineoplastic and immunosuppressive drugs, initial encounter: Secondary | ICD-10-CM

## 2020-02-28 DIAGNOSIS — K1231 Oral mucositis (ulcerative) due to antineoplastic therapy: Principal | ICD-10-CM

## 2020-02-28 DIAGNOSIS — C9201 Acute myeloblastic leukemia, in remission: Principal | ICD-10-CM

## 2020-02-28 DIAGNOSIS — N179 Acute kidney failure, unspecified: Principal | ICD-10-CM

## 2020-02-28 DIAGNOSIS — C9200 Acute myeloblastic leukemia, not having achieved remission: Principal | ICD-10-CM

## 2020-02-28 DIAGNOSIS — Z9484 Stem cells transplant status: Principal | ICD-10-CM

## 2020-02-28 LAB — CBC W/ AUTO DIFF
BASOPHILS ABSOLUTE COUNT: 0 10*9/L (ref 0.0–0.1)
BASOPHILS RELATIVE PERCENT: 0.6 %
EOSINOPHILS ABSOLUTE COUNT: 0 10*9/L (ref 0.0–0.4)
EOSINOPHILS RELATIVE PERCENT: 0.1 %
HEMATOCRIT: 27.2 % — ABNORMAL LOW (ref 41.0–53.0)
HEMOGLOBIN: 8.8 g/dL — ABNORMAL LOW (ref 13.5–17.5)
LARGE UNSTAINED CELLS: 6 % — ABNORMAL HIGH (ref 0–4)
LYMPHOCYTES ABSOLUTE COUNT: 0.9 10*9/L — ABNORMAL LOW (ref 1.5–5.0)
LYMPHOCYTES RELATIVE PERCENT: 19.3 %
MEAN CORPUSCULAR HEMOGLOBIN CONC: 32.4 g/dL (ref 31.0–37.0)
MEAN CORPUSCULAR HEMOGLOBIN: 37.1 pg — ABNORMAL HIGH (ref 26.0–34.0)
MEAN CORPUSCULAR VOLUME: 114.5 fL — ABNORMAL HIGH (ref 80.0–100.0)
MEAN PLATELET VOLUME: 10.7 fL — ABNORMAL HIGH (ref 7.0–10.0)
MONOCYTES ABSOLUTE COUNT: 0.4 10*9/L (ref 0.2–0.8)
MONOCYTES RELATIVE PERCENT: 8.7 %
NEUTROPHILS RELATIVE PERCENT: 65.8 %
PLATELET COUNT: 73 10*9/L — ABNORMAL LOW (ref 150–440)
RED BLOOD CELL COUNT: 2.37 10*12/L — ABNORMAL LOW (ref 4.50–5.90)
RED CELL DISTRIBUTION WIDTH: 18.9 % — ABNORMAL HIGH (ref 12.0–15.0)
WBC ADJUSTED: 4.7 10*9/L (ref 4.5–11.0)

## 2020-02-28 LAB — NEUTROPHILS RELATIVE PERCENT: Neutrophils/100 leukocytes:NFr:Pt:Bld:Qn:Automated count: 65.8

## 2020-02-28 LAB — COMPREHENSIVE METABOLIC PANEL
ALBUMIN: 2.8 g/dL — ABNORMAL LOW (ref 3.4–5.0)
ALKALINE PHOSPHATASE: 95 U/L (ref 46–116)
ALT (SGPT): 14 U/L (ref 10–49)
ANION GAP: 2 mmol/L — ABNORMAL LOW (ref 5–14)
AST (SGOT): 20 U/L (ref <=34)
BILIRUBIN TOTAL: 0.4 mg/dL (ref 0.3–1.2)
BLOOD UREA NITROGEN: 15 mg/dL (ref 9–23)
BUN / CREAT RATIO: 9
CHLORIDE: 106 mmol/L (ref 98–107)
CO2: 29 mmol/L (ref 20.0–31.0)
CREATININE: 1.75 mg/dL — ABNORMAL HIGH
EGFR CKD-EPI AA MALE: 49 mL/min/{1.73_m2} — ABNORMAL LOW (ref >=60)
EGFR CKD-EPI NON-AA MALE: 42 mL/min/{1.73_m2} — ABNORMAL LOW (ref >=60)
GLUCOSE RANDOM: 175 mg/dL (ref 70–179)
POTASSIUM: 4.9 mmol/L — ABNORMAL HIGH (ref 3.4–4.5)
PROTEIN TOTAL: 5.2 g/dL — ABNORMAL LOW (ref 5.7–8.2)
SODIUM: 137 mmol/L (ref 135–145)

## 2020-02-28 LAB — MAGNESIUM: Magnesium:MCnc:Pt:Ser/Plas:Qn:: 1.7

## 2020-02-28 LAB — SMEAR REVIEW

## 2020-02-28 LAB — PROTEIN TOTAL: Protein:MCnc:Pt:Ser/Plas:Qn:: 5.2 — ABNORMAL LOW

## 2020-02-28 NOTE — Unmapped (Signed)
Bone Marrow Transplant and Cellular Therapy Program  Immunosuppressive Therapy Note    John Erickson is a 59 y.o. 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 John Erickson is currently day +26.    Current dose: 1.5 mg PO BID (dose decreased on 10/8)    Goal tacrolimus Level: 5-10 ng/mL    John Erickson reports taking his tacrolimus dose prior to AM labs today. Therefore, level was not drawn and dose will not be adjusted.    We will continue to monitor levels.  Patient will be followed for changes in renal and hepatic function, toxicity, and efficacy.     Eldridge Abrahams, PharmD  [facility] Bone Marrow Transplant and Cellular Therapy Program    Attestation:    I have reviewed and agree with the resident's plan.    Dallas Schimke, PharmD, CPP  BMT Clinical Pharmacist Practitioner

## 2020-02-28 NOTE — Unmapped (Unsigned)
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    Interval History:   John Erickson presents to clinic for his first post discharge visit with his fiance.  He is residing at home in [place].  He reports doing well but has been fatigued,  The past few days he has slept a lot.  He has not been moving around a lot.    He has some soreness to throat.  He has been able to eat solids but only gets a few bites at a time.  He had 1/4 of a sandwhich and then had a shake from [employer], and a handful of fries.  He is drinking water, and gold peak tea.   He has been taking pain meds for his throat every 4 hours. He ask to cut the dose down to 5 mg from 10 mg.  He is taking his pills with apple sauce.    He has absent taste.     ROS:  Comprehensive ROS negative except pertinent positives listed in interval history.     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,XY[1]    Variants of Known or Likely Clinical Significance  Gene Transcript  Predicted Protein  VAF (%)   CEBPA c.[id number] p.Ser251Argfs 41      Variants of Unknown Significance  Gene Transcript Predicted Protein VAF (%)   TET2 c.4946A>G p.Tyr1649Cys 49      -  FLT-3-ITD and FLT-3-TKD studies are negative    Pertinent Phenotypic data:    Disease-specific prognostic estimate:     Induction:   7+3+HD Dauno (C1D1 2/24)    Recovery Marrow:    Final Diagnosis   Date Value Ref Range Status   08/12/2019   Final    Bone marrow, right iliac, aspiration and biopsy  -  Normocellular bone marrow (60%) with trilineage hematopoiesis and 4% blasts by manual aspirate differential  -  Routine cytogenetic analysis is pending  -  Flow cytometric MRD analysis is pending    This electronic signature is attestation that the pathologist personally reviewed the submitted material(s) and the final diagnosis reflects that evaluation.            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           Molecular Genetics: No results found for: MYELOIDMP       AML (  acute myeloid leukemia) (CMS-HCC)   07/09/2019 Biopsy    Bone marrow, left iliac, aspiration and biopsy  -  Hypercellular bone marrow (95%) involved by acute myeloid leukemia (42% blasts by manual aspirate differential)   Abnormal Karyotype: 46,Y,t(X;8)(q26;q11.2)[19]/46,XY[1]  Variants of Known or Likely Clinical Significance  Gene Transcript  Predicted Protein  VAF (%)   CEBPA c.753_762del p.Ser251Argfs 41      Variants of Unknown Significance  Gene Transcript Predicted Protein VAF (%)   TET2 c.4946A>G p.Tyr1649Cys 49      -  FLT-3-ITD and FLT-3-TKD studies are negative     07/09/2019 Initial Diagnosis    AML (acute myeloid leukemia) (CMS-HCC)     07/14/2019 - 07/20/2019 Chemotherapy    IP LEUKEMIA 7+3 HIGH DOSE DAUNOrubicin  DAUNOrubicin 90 mg/m2 IV on Days 1, 2, 3  Cytarabine 100 mg/m2 CIVI on Days 1 to 7     07/27/2019 Biopsy    Bone marrow, right iliac, aspiration and biopsy  -  Hypocellular bone marrow (<5%) with treatment effect, markedly reduced trilineage hematopoiesis, and 3% blasts by manual aspirate differential (see Comment).     08/24/2019 - 09/28/2019 Chemotherapy    IP LEUKEMIA HIGH DOSE CYTARABINE CONSOLIDATION (3 G/M2)  cytarabine 3 g/m2 every 12 hours on days 1, 3 and 5 every 28 days     08/24/2019 - 12/26/2019 Chemotherapy    IP LEUKEMIA HIGH DOSE CYTARABINE CONSOLIDATION (3 G/M2) ON DAYS 1,2,3  cytarabine 3 g/m2 IV every 12 hours on days 1, 2, 3, every 28 days     01/19/2020 - 01/19/2020 Chemotherapy    BMT OP BUSULFAN TEST DOSE  Busulfan 0.8 mg/kg IV ONCE     01/26/2020 -  Chemotherapy    BMT IP MAC BUSULFAN / FLUDARABINE / rATG (MUD)   Busulfan IV Days -6 to -3  Fludarabine 40 mg/m2 IV Days -6 to -3  rATG 0.5 mg/kg IV Day -3, 1.5 mg/kg IV Day -2, 2.5 mg/kg IV Day -1       Physical exam:    Wt Readings from Last 1 Encounters:   02/28/20 77.3 kg (170 lb 6.4 oz)     Temp Readings from Last 1 Encounters:   02/28/20 35.9 ??C (96.7 ??F) (Temporal)     BP Readings from Last 1 Encounters:   02/28/20 91/67     Pulse Readings from Last 1 Encounters:   02/28/20 99     SpO2 Readings from Last 1 Encounters:   02/28/20 100%     70, Cares for self; unable to carry on normal activity or to do active work (ECOG equivalent 1)      General: No acute distress noted.   Central venous access: Line clean, dry, intact. No erythema or drainage noted.   ENT: Moist mucous membranes, no visible ulcers. Coated tongue.    Cardiovascular: Tachycardic, with regular rate and rhythm. S1 and S2 normal, without any murmur, rub, or gallop.  Lungs: Clear to auscultation bilaterally, without wheezes/crackles/rhonchi. Good air movement.   Skin: Warm, dry, intact. Skin peeling on back.  No rash noted.    Psychiatry: Alert and oriented to person, place, and time.   Gastrointestinal/Abdomen: Normoactive bowel sounds, abdomen soft, non-tender   Musculoskeletal/Extremities: FROM throughout. 1+ edema bilaterally around ankles up to mid shin. Neurologic: CNII-XII intact. Normal strength and sensation throughout    Lab Results   Component Value Date    WBC 5.7 02/25/2020    HGB 8.4 (L) 02/25/2020    HCT 26.0 (L) 02/25/2020  PLT 46 (L) 02/25/2020     Lab Results   Component Value Date    NA 135 02/25/2020    K 5.0 (H) 02/25/2020    CL 103 02/25/2020    CO2 27.0 02/25/2020    BUN 15 02/25/2020    CREATININE 1.82 (H) 02/25/2020    GLU 152 02/25/2020    CALCIUM 8.7 02/25/2020    MG 1.5 (L) 02/25/2020    PHOS 4.1 02/22/2020     Lab Results   Component Value Date    BILITOT 0.4 02/25/2020    BILIDIR 0.30 02/21/2020    PROT 5.2 (L) 02/25/2020    ALBUMIN 2.8 (L) 02/25/2020    ALT 11 02/25/2020    AST 12 02/25/2020    ALKPHOS 98 02/25/2020    GGT 21 01/26/2020     Lab Results   Component Value Date    PT 14.1 (H) 02/22/2020    INR 1.21 02/22/2020    APTT 33.2 02/17/2020     Assessment and Plan    BMT:??AML, CR MRD Negative  HCT-CI (age adjusted) 7??(psych, severe pulmonary dysfunction, and age)   Conditioning:??MAC Bu/Flu/ATG  Donor:??9/10, ABO O+, CMV+  Engraftment:??Granix starting D+12 through engraftment (as defined as ANC 1.0 x 2 days or 3.0 x 1 day)  - Date of last granix injection: 02/19/20    GVHD prophylaxis:??  1.Tacrolimus being managed by pharmacy with a goal of 5-10 ng/mL  2. Methotrexate??15 mg/m2 IVP on day +1 then 10 mg/m2 on days +3, +6 and +11  3. ATG per [facility] standard dosing will??be included  ??  Heme:??  Transfusion criteria:??1 unit of PRBCs for Hgb<7 and 1 unit of platelets for Plt <10K or bleeding.   -??No history of transfusion reactions.     ID:??  Prophylaxis:  - Antiviral: Valtrex 500 mg po daily   - Antifungal: Fluconazole 400 mg po daily, continue through day +75  - PJP: Dapsone (has Sulfa allergy) upon platelet engraftment.   ????** G6PD normal, 8.3  - CMV D+/R+: Letermovir prophy, 480mg  po daily, continue through D+100 (Being delivered today to house, dose given in clinic)  ??  Neutropenic Fever:  02/03/20: Tmax 38.4 Cefepime (02/03/20 - 02/09/20)  - Cultures NGx 5 days  ??  02/13/20: Spiked again at 5am 9/26  - Cultures negative at 5 days  - Started on IV cefepime (9/26- 10/1) and IV flagyl (anaerobic coverage given mucositis) (9/26-10/1). Received a dose of IV vanc but no indication to continue.   ??  Allergy:  - PCN allergy (hives) has tolerated Cefepime without issues  ??  EBV:  - IgM Ab + 01/05/20 but viral load 01/05/20 not detected.     GI:??  GERD prophylaxis:  - Pepcid BID  ??  Nausea:  - Anti-emetics per BMT protocol.   ??  Mucositis (chemotherapy related):  - Improved, stop PCA 10/4, will have oral oxy or prn iv dilaudid  ??  Renal:   AKI:   -02/25/20: SCr up to 1.8 today. Likely from supratherapeutic tacrolimus level plus decreased fluid intake due to resolving mucositis. Will give a liter of IV fluids and instructed to push fluids over the weekend.   -10/11:  Cr is 1.7.  This is better. He has room for improvement and feels that this is due to decreased intake.  He wants to increase oral intake and get fluids if he is not getting better Wednesday.   ??  FEN:  - Lasix 20 mg iv once 10/2   ??  Electrolytes:  - Stop oral potassium 10/8 due to high level from AKI  - Continue oral magnesium and Mag 2gm IV given in clinic 10/8 since he is getting fluids also (level 1.5)  ??  Supplements: Held on admission  - B complex, Vitamin D  ??  Hepatic:??  - No active issues.   - VOD prophylaxis with Actigall TID.     CV:    HTN: New, possibly tac related  - Amlodipine 5mg  daily    Pulm: DLCO 62%  - Former smoker, quit 01/12/20. Using nicotine patch currently. Has chronic dry cough but PFTs were acceptable.   07/24/19: CT chest w/ 0.6cm RUL nodules, no clear etiology.     ** Discussed with Dr. Oswald Hillock prior to transplant, ok to move forward, no plans to repeat unless new findings.       Neuro/Pain:??  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   ??  Mucositis: (chemotherapy induced)   Grade 3,  Dilaudid PCA (AKI so Dilaudid chosen over Morphine)   - 9/23 started with 0.5 mg PCA dose q 15 min prn, titrate up as needed  - 9/27: Increased bolus to 0.75 mg q25min  -9/28: Decreased back to 0.5 dosing per patient request as sleeping too frequently  -9/30: Decrease to 0.25 for pt request with improvment   -10/4: Stopped PCA, will transition back to oral oxy 10 mg po q 4 hr prn, will also have iv prn dilaudid for severe pain  -10/5: Received oxy this morning with good relief.   -10/8: Resolving, took one tylenol last night and instructed to use oxycodone   -10/11: Decrease oxycodone to 5 mg vs 10 mg     Psychosocial:??  - Substance abuse (opioid history). Sober for 5 years.  - Followed by Frederik Schmidt in [facility], placed referral for [facility] while admitted, are not seeing inpatients, will follow- up with him post discharge.   - Insomnia: Melatonin??3mg  and Trazodone 50mg  nightly prn.   - Anxiety: Hospitalization related. Well controlled.     ** Klonopin 0.5/1mg  daily, at discharge will stop morning dose and keep evening dose.   ??  Summary: Plan as above D+26    - RTC Wed and Friday.          Westley Hummer ANP-BC, AOCNP  [facility]    Future Appointments   Date Time Provider Department Center   02/28/2020  1:00 PM Baker Pierini Carson, ANP [facility]   02/28/2020  2:00 PM ONCINF CHAIR 07 [id number]   03/01/2020  9:30 AM [facility] DEXA RM 1 [facility] [place]   03/01/2020 10:45 AM ONCBMT LABS [facility]   03/01/2020 11:15 AM ONCBMT APP B [facility]   03/01/2020 12:30 PM ONCINF CHAIR 04 [facility]   03/03/2020  7:45 AM ONCBMT LABS [facility]   03/03/2020  8:15 AM ONCBMT APP B [facility]   03/03/2020  9:30 AM ONCINF CHAIR 06 [facility]   03/07/2020  7:30 AM ONCBMT LABS [facility]   03/07/2020  8:00 AM ONCBMT APP A [facility]   03/07/2020  9:30 AM ONCINF CHAIR 06 [facility]   03/10/2020  7:30 AM ONCBMT LABS [facility] 03/10/2020  8:00 AM ONCBMT APP A [facility]   03/10/2020  9:30 AM ONCINF CHAIR 06 [id number]   03/15/2020  9:15 AM ONCBMT LABS  [facility]   03/15/2020  9:45 AM Artelia Laroche, MD [facility]   03/15/2020 10:30 AM ONCINF CHAIR 02 [facility]   04/11/2020  8:15 AM ONCBMT LABS [facility]   04/11/2020  8:45 AM Artelia Laroche, MD [facility]   04/11/2020 10:00 AM ONCINF CHAIR 03 [facility]   04/26/2020  7:45 AM ONCBMT LABS [facility]   04/26/2020  8:15 AM ONCBMT APP B [facility]   04/26/2020  9:30 AM Bernerd Limbo, FNP [facility]   05/03/2020  9:15 AM ONCBMT LABS [facility]   05/03/2020  9:45 AM Artelia Laroche, MD [facility]       I personally spent 60 minutes face-to-face and non-face-to-face in the care of this patient, which includes all pre, intra, and post visit time on the date of service.

## 2020-02-28 NOTE — Unmapped (Signed)
Recent Results (from the past 24 hour(s))   Magnesium Level    Collection Time: 02/28/20  1:00 PM   Result Value Ref Range    Magnesium 1.7 1.6 - 2.6 mg/dL   Comprehensive Metabolic Panel    Collection Time: 02/28/20  1:00 PM   Result Value Ref Range    Sodium 137 135 - 145 mmol/L    Potassium 4.9 (H) 3.4 - 4.5 mmol/L    Chloride 106 98 - 107 mmol/L    Anion Gap 2 (L) 5 - 14 mmol/L    CO2 29.0 20.0 - 31.0 mmol/L    BUN 15 9 - 23 mg/dL    Creatinine 1.61 (H) 0.70 - 1.30 mg/dL    BUN/Creatinine Ratio 9     EGFR CKD-EPI Non-African American, Male 42 (L) >=60 mL/min/1.19m2    EGFR CKD-EPI African American, Male 49 (L) >=60 mL/min/1.16m2    Glucose 175 70 - 179 mg/dL    Calcium 8.8 8.7 - 09.6 mg/dL    Albumin 2.8 (L) 3.4 - 5.0 g/dL    Total Protein 5.2 (L) 5.7 - 8.2 g/dL    Total Bilirubin 0.4 0.3 - 1.2 mg/dL    AST 20 <=04 U/L    ALT 14 10 - 49 U/L    Alkaline Phosphatase 95 46 - 116 U/L   CBC w/ Differential    Collection Time: 02/28/20  1:00 PM   Result Value Ref Range    WBC 4.7 4.5 - 11.0 10*9/L    RBC 2.37 (L) 4.50 - 5.90 10*12/L    HGB 8.8 (L) 13.5 - 17.5 g/dL    HCT 54.0 (L) 98.1 - 53.0 %    MCV 114.5 (H) 80.0 - 100.0 fL    MCH 37.1 (H) 26.0 - 34.0 pg    MCHC 32.4 31.0 - 37.0 g/dL    RDW 19.1 (H) 47.8 - 15.0 %    MPV 10.7 (H) 7.0 - 10.0 fL    Platelet 73 (L) 150 - 440 10*9/L    Variable HGB Concentration Slight (A) Not Present    Macrocytosis Marked (A) Not Present    Anisocytosis Moderate (A) Not Present    Hypochromasia Marked (A) Not Present

## 2020-02-29 NOTE — Unmapped (Signed)
Clinical Assessment Needed For: Dose Change  Medication: Tacrolimus  Last Fill Date/Day Supply: 02/23/2020 / 30 day supply  Refill Too Soon until 03/17/2020  Was previous dose already scheduled to fill: No    Notes to Pharmacist:

## 2020-02-29 NOTE — Unmapped (Signed)
Addended by: Rocky Crafts on: 02/29/2020 08:17 AM     Modules accepted: Orders

## 2020-03-01 ENCOUNTER — Ambulatory Visit: Admit: 2020-03-01 | Discharge: 2020-03-02 | Payer: PRIVATE HEALTH INSURANCE

## 2020-03-01 ENCOUNTER — Encounter: Admit: 2020-03-01 | Discharge: 2020-03-02 | Payer: PRIVATE HEALTH INSURANCE

## 2020-03-01 DIAGNOSIS — Z9484 Stem cells transplant status: Principal | ICD-10-CM

## 2020-03-01 DIAGNOSIS — C9201 Acute myeloblastic leukemia, in remission: Principal | ICD-10-CM

## 2020-03-01 LAB — COMPREHENSIVE METABOLIC PANEL
ALBUMIN: 2.9 g/dL — ABNORMAL LOW (ref 3.4–5.0)
ALT (SGPT): 12 U/L (ref 10–49)
ANION GAP: 6 mmol/L (ref 5–14)
AST (SGOT): 16 U/L (ref <=34)
BILIRUBIN TOTAL: 0.6 mg/dL (ref 0.3–1.2)
BLOOD UREA NITROGEN: 17 mg/dL (ref 9–23)
BUN / CREAT RATIO: 10
CALCIUM: 8.9 mg/dL (ref 8.7–10.4)
CHLORIDE: 103 mmol/L (ref 98–107)
CO2: 29 mmol/L (ref 20.0–31.0)
CREATININE: 1.77 mg/dL — ABNORMAL HIGH
EGFR CKD-EPI AA MALE: 48 mL/min/{1.73_m2} — ABNORMAL LOW (ref >=60)
EGFR CKD-EPI NON-AA MALE: 42 mL/min/{1.73_m2} — ABNORMAL LOW (ref >=60)
GLUCOSE RANDOM: 139 mg/dL (ref 70–179)
PROTEIN TOTAL: 5.6 g/dL — ABNORMAL LOW (ref 5.7–8.2)
SODIUM: 138 mmol/L (ref 135–145)

## 2020-03-01 LAB — CBC W/ AUTO DIFF
BASOPHILS ABSOLUTE COUNT: 0 10*9/L (ref 0.0–0.1)
BASOPHILS RELATIVE PERCENT: 0.3 %
EOSINOPHILS ABSOLUTE COUNT: 0 10*9/L (ref 0.0–0.4)
EOSINOPHILS RELATIVE PERCENT: 0.1 %
HEMATOCRIT: 27.1 % — ABNORMAL LOW (ref 41.0–53.0)
HEMOGLOBIN: 9.1 g/dL — ABNORMAL LOW (ref 13.5–17.5)
LARGE UNSTAINED CELLS: 3 % (ref 0–4)
LYMPHOCYTES ABSOLUTE COUNT: 1.1 10*9/L — ABNORMAL LOW (ref 1.5–5.0)
LYMPHOCYTES RELATIVE PERCENT: 19 %
MEAN CORPUSCULAR HEMOGLOBIN CONC: 33.6 g/dL (ref 31.0–37.0)
MEAN CORPUSCULAR HEMOGLOBIN: 38.2 pg — ABNORMAL HIGH (ref 26.0–34.0)
MEAN CORPUSCULAR VOLUME: 113.7 fL — ABNORMAL HIGH (ref 80.0–100.0)
MEAN PLATELET VOLUME: 11 fL — ABNORMAL HIGH (ref 7.0–10.0)
MONOCYTES RELATIVE PERCENT: 7.9 %
NEUTROPHILS ABSOLUTE COUNT: 4.1 10*9/L (ref 2.0–7.5)
NEUTROPHILS RELATIVE PERCENT: 69.6 %
PLATELET COUNT: 78 10*9/L — ABNORMAL LOW (ref 150–440)
RED BLOOD CELL COUNT: 2.39 10*12/L — ABNORMAL LOW (ref 4.50–5.90)
WBC ADJUSTED: 5.9 10*9/L (ref 4.5–11.0)

## 2020-03-01 LAB — MAGNESIUM: Magnesium:MCnc:Pt:Ser/Plas:Qn:: 1.7

## 2020-03-01 LAB — CMV DNA, QUANTITATIVE, PCR: CMV VIRAL LD: DETECTED — AB

## 2020-03-01 LAB — BILIRUBIN TOTAL: Bilirubin:MCnc:Pt:Ser/Plas:Qn:: 0.6

## 2020-03-01 LAB — CMV COMMENT: Lab: 0

## 2020-03-01 LAB — SMEAR REVIEW

## 2020-03-01 LAB — RED BLOOD CELL COUNT: Lab: 2.39 — ABNORMAL LOW

## 2020-03-01 LAB — TACROLIMUS, TROUGH: Lab: 9.6

## 2020-03-01 MED ORDER — TACROLIMUS 0.5 MG CAPSULE, IMMEDIATE-RELEASE
ORAL_CAPSULE | Freq: Two times a day (BID) | ORAL | 5 refills | 30.00000 days | Status: CP
Start: 2020-03-01 — End: ?

## 2020-03-01 MED ADMIN — heparin, porcine (PF) 100 unit/mL injection 200 Units: 200 [IU] | INTRAVENOUS | @ 14:00:00 | Stop: 2020-03-01

## 2020-03-01 NOTE — Unmapped (Signed)
Called [name] re: change in dose of tac.  LVM for patient to return call. Current dose: tac 0.5 mg caps - 1.5 mg BID

## 2020-03-01 NOTE — Unmapped (Signed)
[facility]  Immunosuppressive Therapy Note    [name] is a 59 y.o. 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 [name] is currently day 13.    Current dose: 1.5 mg PO BID (dose decreased on 10/8)    Goal tacrolimus Level: 5-10 ng/mL    Resulted level: 9.6 ng/mL (caregiver reports he is taking tacrolimus as prescribed)     Lab Results   Component Value Date/Time    TACROLIMUS 9.6 03/01/2020 10:15 AM    TACROLIMUS 10.2 02/25/2020 07:35 AM    TACROLIMUS 11.1 02/23/2020 08:11 AM     Lab Results   Component Value Date/Time    CREATININE 1.77 (H) 03/01/2020 10:15 AM    CREATININE 1.75 (H) 02/28/2020 01:00 PM    CREATININE 1.82 (H) 02/25/2020 07:35 AM     Tacrolimus level is at the upper end of therapeutic range and renal function is significantly up and tacrolimus level is likely to trend up in setting or worsening renal function. Plan to decrease tacrolimus dose to 1 mg PO twice daily and recheck tacrolimus level and renal function at next clinic visit.     We will continue to monitor levels.  Patient will be followed for changes in renal and hepatic function, toxicity, and efficacy.     Rulon Abide, PharmD CPP  [facility]

## 2020-03-01 NOTE — Unmapped (Signed)
Instructions:   - A BMT pharmacist will call you if I need to make changes to your tacrolimus dose.   - Keep on working on drinking, eating and moving.    Return to clinic on Friday to see one of the providers.     Covid:   Given ongoing novel coronavirus (COVID-19), it is strongly recommended to avoid travel and crowds.  If you develop fever, cough, shortness of breath and/or have known exposure (close contact < 3 feet) with someone who has tested positive, please notify us immediately.    ??? Your best defense is to stay home as much as possible and use good hand hygiene.    For prescription refills:   For refills, please check your medication bottles to see if you have additional refills left. If so, please call your pharmacy and follow the directions to request a refill. If you do not have any refills left, please make a request during your clinic visit or by submitting a request through MyChart or by calling [phone]. Please allow 24 hours if your request is made during the week or 48 hours if requests are made on the weekends or holidays.     Labs:   Lab Results   Component Value Date    WBC 5.9 03/01/2020    HGB 9.1 (L) 03/01/2020    HCT 27.1 (L) 03/01/2020    PLT 78 (L) 03/01/2020     Lab Results   Component Value Date    NA 138 03/01/2020    K 4.8 (H) 03/01/2020    CL 103 03/01/2020    CO2 29.0 03/01/2020    BUN 17 03/01/2020    CREATININE 1.77 (H) 03/01/2020    GLU 139 03/01/2020    CALCIUM 8.9 03/01/2020    MG 1.7 03/01/2020    PHOS 4.1 02/22/2020     Lab Results   Component Value Date    BILITOT 0.6 03/01/2020    BILIDIR 0.30 02/21/2020    PROT 5.6 (L) 03/01/2020    ALBUMIN 2.9 (L) 03/01/2020    ALT 12 03/01/2020    AST 16 03/01/2020    ALKPHOS 96 03/01/2020    GGT 21 01/26/2020     Lab Results   Component Value Date    INR 1.21 02/22/2020    APTT 33.2 02/17/2020       --------------------------------------------------------------------------------------------------------------------  For appointments & questions Monday through Friday 8 AM-4:30 PM     Please call [phone] or Toll free [phone]    On Nights, Weekends and Holidays  Call [phone] and ask for the oncologist on call    Please visit PrivacyFever[employer]cz, a resource created just for family members and caregivers.  This website lists support services, how and where to ask for help. It has tools to assist you as you help [place] care for your loved one.    [facility]  [address]  [place], [place] [address]  [email or web address]

## 2020-03-01 NOTE — Unmapped (Signed)
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  Transplant was tolerated relatively well and primarily complicated by mucositis requiring PCA.    Interval History:   [name] presents to clinic for his first post discharge visit with his older brother.  He is mostly experiencing fatigue, but is getting through the day.  Daily activities are exhausting.  Eating is still difficult with solid foods but is slowly eating more and ate a cracker this morning for the first time.  Not super painful when he did.  Still looking weight.  No sob and trying to do some walking. Minimal lightheadedness with standing so does this slowly.  Food is still not tasting entirely normal.   Trying to drink 2L a day and getting closer.      ROS:  Comprehensive ROS negative except pertinent positives listed in interval history.     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,XY[1]    Variants of Known or Likely Clinical Significance  Gene Transcript  Predicted Protein  VAF (%)   CEBPA c.[id number] p.Ser251Argfs 41      Variants of Unknown Significance  Gene Transcript Predicted Protein VAF (%)   TET2 c.4946A>G p.Tyr1649Cys 49      -  FLT-3-ITD and FLT-3-TKD studies are negative    Pertinent Phenotypic data:    Disease-specific prognostic estimate:     Induction:   7+3+HD Dauno (C1D1 2/24)    Recovery Marrow:    Final Diagnosis   Date Value Ref Range Status   08/12/2019   Final    Bone marrow, right iliac, aspiration and biopsy  -  Normocellular bone marrow (60%) with trilineage hematopoiesis and 4% blasts by manual aspirate differential  -  Routine cytogenetic analysis is pending  -  Flow cytometric MRD analysis is pending    This electronic signature is attestation that the pathologist personally reviewed the submitted material(s) and the final diagnosis reflects that evaluation.            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,XY[1]  Variants of Known or Likely Clinical Significance  Gene  Transcript  Predicted Protein  VAF (%)   CEBPA c.753_762del p.Ser251Argfs 41      Variants of Unknown Significance  Gene Transcript Predicted Protein VAF (%)   TET2 c.4946A>G p.Tyr1649Cys 49      -  FLT-3-ITD and FLT-3-TKD studies are negative     07/09/2019 Initial Diagnosis    AML (acute myeloid leukemia) (CMS-HCC)     07/14/2019 - 07/20/2019 Chemotherapy    IP LEUKEMIA 7+3 HIGH DOSE DAUNOrubicin  DAUNOrubicin 90 mg/m2 IV on Days 1, 2, 3  Cytarabine 100 mg/m2 CIVI on Days 1 to 7     07/27/2019 Biopsy    Bone marrow, right iliac, aspiration and biopsy  -  Hypocellular bone marrow (<5%) with treatment effect, markedly reduced trilineage hematopoiesis, and 3% blasts by manual aspirate differential (see Comment). 08/24/2019 - 09/28/2019 Chemotherapy    IP LEUKEMIA HIGH DOSE CYTARABINE CONSOLIDATION (3 G/M2)  cytarabine 3 g/m2 every 12 hours on days 1, 3 and 5 every 28 days     08/24/2019 - 12/26/2019 Chemotherapy    IP LEUKEMIA HIGH DOSE CYTARABINE CONSOLIDATION (3 G/M2) ON DAYS 1,2,3  cytarabine 3 g/m2 IV every 12 hours on days 1, 2, 3, every 28 days     01/19/2020 - 01/19/2020 Chemotherapy    BMT OP BUSULFAN TEST DOSE  Busulfan 0.8 mg/kg IV ONCE     01/26/2020 -  Chemotherapy    BMT IP MAC BUSULFAN / FLUDARABINE / rATG (MUD)   Busulfan IV Days -6 to -3  Fludarabine 40 mg/m2 IV Days -6 to -3  rATG 0.5 mg/kg IV Day -3, 1.5 mg/kg IV Day -2, 2.5 mg/kg IV Day -1       Physical exam:Temp:  [36.2 ??C (97.1 ??F)] 36.2 ??C (97.1 ??F)  BP: (108)/(80) 108/80   BP 108/80  - Temp 36.2 ??C (97.1 ??F)  - Resp 18  - Ht 170.2 cm (5' 7.01)  - Wt 76.6 kg (168 lb 14.4 oz)  - SpO2 100%  - BMI 26.45 kg/m??   70, Cares for self; unable to carry on normal activity or to do active work (ECOG equivalent 1)    General: No acute distress noted.   Central venous access: Line clean, dry, intact. No erythema or drainage noted.   ENT: Moist mucous membranes, no visible ulcers. Coated tongue.    Cardiovascular: Tachycardic, with regular rate and rhythm. S1 and S2 normal, without any murmur, rub, or gallop.  Lungs: Clear to auscultation bilaterally, without wheezes/crackles/rhonchi. Good air movement.   Skin: Warm, dry, intact. Skin peeling on back.  No rash noted.    Psychiatry: Alert and oriented to person, place, and time.   Gastrointestinal/Abdomen: Normoactive bowel sounds, abdomen soft, non-tender   Musculoskeletal/Extremities: FROM throughout. 1+ edema bilaterally around ankles up to mid shin.   Neurologic: CNII-XII intact. Normal strength and sensation throughout    Lab Results   Component Value Date    WBC 5.9 03/01/2020    HGB 9.1 (L) 03/01/2020    HCT 27.1 (L) 03/01/2020    PLT 78 (L) 03/01/2020     Lab Results   Component Value Date    NA 138 03/01/2020    K 4.8 (H) 03/01/2020    CL 103 03/01/2020    CO2 29.0 03/01/2020    BUN 17 03/01/2020    CREATININE 1.77 (H) 03/01/2020    GLU 139 03/01/2020    CALCIUM 8.9 03/01/2020    MG 1.7 03/01/2020    PHOS 4.1 02/22/2020  Lab Results   Component Value Date    BILITOT 0.6 03/01/2020    BILIDIR 0.30 02/21/2020    PROT 5.6 (L) 03/01/2020    ALBUMIN 2.9 (L) 03/01/2020    ALT 12 03/01/2020    AST 16 03/01/2020    ALKPHOS 96 03/01/2020    GGT 21 01/26/2020     Lab Results   Component Value Date    PT 14.1 (H) 02/22/2020    INR 1.21 02/22/2020    APTT 33.2 02/17/2020     Assessment and Plan    BMT:??AML, CR MRD Negative  HCT-CI (age adjusted) 5??(psych, severe pulmonary dysfunction, and age)   Conditioning:??MAC Bu/Flu/ATG  Donor:??9/10, ABO O+, CMV+  Engraftment:??Granix starting D+12 through engraftment (as defined as ANC 1.0 x 2 days or 3.0 x 1 day)  - Date of last granix injection: 02/19/20    GVHD prophylaxis:??  1.Tacrolimus being managed by pharmacy with a goal of 5-10 ng/mL  2. Methotrexate??15 mg/m2 IVP on day +1 then 10 mg/m2 on days +3, +6 and +11  3. ATG per [facility] standard dosing will??be included  ??  Heme:??  Transfusion criteria:??1 unit of PRBCs for Hgb<7 and 1 unit of platelets for Plt <10K or bleeding.   -??No history of transfusion reactions.     ID:??  Prophylaxis:  - Antiviral: Valtrex 500 mg po daily   - Antifungal: Fluconazole 400 mg po daily, continue through day +75  - PJP: Dapsone (has Sulfa allergy) upon platelet engraftment.   ????** G6PD normal, 8.3  - CMV D+/R+: Letermovir prophy, 480mg  po daily, continue through D+100 (Being delivered today to house, dose given in clinic)  ??  Neutropenic Fever:  02/03/20: Tmax 38.4 Cefepime (02/03/20 - 02/09/20)  - Cultures NGx 5 days  ??  02/13/20: Spiked again at 5am 9/26  - Cultures negative at 5 days  - Started on IV cefepime (9/26- 10/1) and IV flagyl (anaerobic coverage given mucositis) (9/26-10/1). Received a dose of IV vanc but no indication to continue.   ??  Allergy:  - PCN allergy (hives) has tolerated Cefepime without issues  ??  EBV:  - IgM Ab + 01/05/20 but viral load 01/05/20 not detected.     GI:??  GERD prophylaxis:  - Pepcid BID  ??  Nausea:  - Anti-emetics per BMT protocol.   ??  Mucositis (chemotherapy related):  - Improved, stop PCA 10/4, will have oral oxy or prn iv dilaudid  ??  Renal:   AKI:   -02/25/20: SCr up to 1.8 today. Likely from supratherapeutic tacrolimus level plus decreased fluid intake due to resolving mucositis. Will give a liter of IV fluids and instructed to push fluids over the weekend.   -10/13:  Cr is 1.7.  This is now stable and hopefully plateauing from tacrolimus supratherpeutic level.  Encouraged him to continue to drink well and we will monitor closely as tacrolimus levels come down.  ??  FEN:   ??Electrolytes:  - Stop oral potassium 10/8 due to high level from AKI  - Continue oral magnesium   ??  Supplements: Held on admission  - B complex, Vitamin D  ??  Hepatic:??  - No active issues.   - VOD prophylaxis with Actigall TID.     CV:    HTN: New, possibly tac related  - Amlodipine 5mg  daily    Pulm: DLCO 62%  - Former smoker, quit 01/12/20. Using nicotine patch currently. Has chronic dry cough but PFTs were acceptable.   07/24/19:  CT chest w/ 0.6cm RUL nodules, no clear etiology.     ** Discussed with Dr. Oswald Hillock prior to transplant, ok to move forward, no plans to repeat unless new findings.       Neuro/Pain:??  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   ??  Mucositis: (chemotherapy induced)   Grade 3,  Dilaudid PCA (AKI so Dilaudid chosen over Morphine)   - 9/23 started with 0.5 mg PCA dose q 15 min prn, titrate up as needed  - 9/27: Increased bolus to 0.75 mg q59min  -9/28: Decreased back to 0.5 dosing per patient request as sleeping too frequently  -9/30: Decrease to 0.25 for pt request with improvment   -10/4: Stopped PCA, will transition back to oral oxy 10 mg po q 4 hr prn, will also have iv prn dilaudid for severe pain  -10/5: Received oxy this morning with good relief.   -10/8: Resolving, took one tylenol last night and instructed to use oxycodone   -10/11: Decrease oxycodone to 5 mg vs 10 mg     Psychosocial:??  - Substance abuse (opioid history). Sober for 5 years.  - Followed by Frederik Schmidt in [facility], placed referral for [facility] while admitted, are not seeing inpatients, will follow- up with him post discharge.   - Insomnia: Melatonin??3mg  and Trazodone 50mg  nightly prn.   - Anxiety: Hospitalization related. Well controlled.     ** Klonopin 0.5/1mg  daily, at discharge will stop morning dose and keep evening dose.   ??  Summary: Plan as above D+28    - RTC Friday this week with APP, then Tues/Fri next week.    Dyna Figuereo Elie Confer, [facility]  Physician Assistant  Adult Bone Marrow Transplantation    Future Appointments   Date Time Provider Department Center   03/01/2020 12:30 PM ONCINF CHAIR 04 [id number]   03/03/2020  7:45 AM ONCBMT LABS [facility]   03/03/2020  8:15 AM ONCBMT APP B [facility]   03/03/2020  9:30 AM ONCINF CHAIR 06 [id number]   03/07/2020  7:30 AM ONCBMT LABS [facility]   03/07/2020  8:00 AM ONCBMT APP A [facility]   03/07/2020  9:30 AM ONCINF CHAIR 06 [id number]   03/10/2020  7:30 AM ONCBMT LABS [facility]   03/10/2020  8:00 AM ONCBMT APP A [facility]   03/10/2020  9:30 AM ONCINF CHAIR 06 [facility]   03/15/2020  9:15 AM ONCBMT LABS [facility]   03/15/2020  9:45 AM Artelia Laroche, MD [facility]   03/15/2020 10:30 AM ONCINF CHAIR 02 [id number]   04/11/2020  8:15 AM ONCBMT LABS [facility]   04/11/2020  8:45 AM Artelia Laroche, MD [facility]   04/11/2020 10:00 AM ONCINF CHAIR 03 [id number]   04/26/2020  7:45 AM ONCBMT LABS [facility]   04/26/2020  8:15 AM ONCBMT APP B [facility]   04/26/2020  9:30 AM Bernerd Limbo, FNP [facility]   05/03/2020  9:15 AM ONCBMT LABS [facility]   05/03/2020  9:45 AM Artelia Laroche, MD [facility]       I personally spent 45 minutes face-to-face and non-face-to-face in the care of this patient, which includes all pre, intra, and post visit time on the date of service.

## 2020-03-02 LAB — EBV VIRAL LOAD RESULT: Lab: NOT DETECTED

## 2020-03-02 NOTE — Unmapped (Signed)
Clinical Assessment Needed For: Dose Change  Medication: tacrolimus  Last Fill Date/Day Supply: 02/23/20 / 30      Refill Too Soon until 10/29  Was previous dose already scheduled to fill: No    Notes to Pharmacist: Will re-test claim

## 2020-03-03 ENCOUNTER — Encounter: Admit: 2020-03-03 | Discharge: 2020-03-04 | Payer: PRIVATE HEALTH INSURANCE

## 2020-03-03 ENCOUNTER — Other Ambulatory Visit: Admit: 2020-03-03 | Discharge: 2020-03-04 | Payer: PRIVATE HEALTH INSURANCE

## 2020-03-03 DIAGNOSIS — D84822 Immunocompromised state associated with stem cell transplant (CMS-HCC): Secondary | ICD-10-CM

## 2020-03-03 DIAGNOSIS — G629 Polyneuropathy, unspecified: Principal | ICD-10-CM

## 2020-03-03 DIAGNOSIS — E875 Hyperkalemia: Principal | ICD-10-CM

## 2020-03-03 DIAGNOSIS — C9201 Acute myeloblastic leukemia, in remission: Principal | ICD-10-CM

## 2020-03-03 DIAGNOSIS — N179 Acute kidney failure, unspecified: Principal | ICD-10-CM

## 2020-03-03 DIAGNOSIS — Z9484 Stem cells transplant status: Principal | ICD-10-CM

## 2020-03-03 DIAGNOSIS — K1231 Oral mucositis (ulcerative) due to antineoplastic therapy: Principal | ICD-10-CM

## 2020-03-03 LAB — COMPREHENSIVE METABOLIC PANEL
ALBUMIN: 3 g/dL — ABNORMAL LOW (ref 3.4–5.0)
ALKALINE PHOSPHATASE: 89 U/L (ref 46–116)
ALT (SGPT): 11 U/L (ref 10–49)
ANION GAP: 6 mmol/L (ref 5–14)
AST (SGOT): 15 U/L (ref <=34)
BILIRUBIN TOTAL: 0.4 mg/dL (ref 0.3–1.2)
BLOOD UREA NITROGEN: 18 mg/dL (ref 9–23)
BUN / CREAT RATIO: 11
CALCIUM: 9 mg/dL (ref 8.7–10.4)
CHLORIDE: 106 mmol/L (ref 98–107)
CO2: 29 mmol/L (ref 20.0–31.0)
CREATININE: 1.64 mg/dL — ABNORMAL HIGH
EGFR CKD-EPI AA MALE: 53 mL/min/{1.73_m2} — ABNORMAL LOW (ref >=60)
GLUCOSE RANDOM: 121 mg/dL (ref 70–179)
POTASSIUM: 5 mmol/L — ABNORMAL HIGH (ref 3.4–4.5)
PROTEIN TOTAL: 5.6 g/dL — ABNORMAL LOW (ref 5.7–8.2)
SODIUM: 141 mmol/L (ref 135–145)

## 2020-03-03 LAB — CBC W/ AUTO DIFF
BASOPHILS ABSOLUTE COUNT: 0 10*9/L (ref 0.0–0.1)
BASOPHILS RELATIVE PERCENT: 0.4 %
EOSINOPHILS ABSOLUTE COUNT: 0 10*9/L (ref 0.0–0.4)
EOSINOPHILS RELATIVE PERCENT: 0.4 %
HEMATOCRIT: 28 % — ABNORMAL LOW (ref 41.0–53.0)
HEMOGLOBIN: 9.4 g/dL — ABNORMAL LOW (ref 13.5–17.5)
LARGE UNSTAINED CELLS: 6 % — ABNORMAL HIGH (ref 0–4)
LYMPHOCYTES RELATIVE PERCENT: 29 %
MEAN CORPUSCULAR HEMOGLOBIN CONC: 33.5 g/dL (ref 31.0–37.0)
MEAN CORPUSCULAR HEMOGLOBIN: 38.5 pg — ABNORMAL HIGH (ref 26.0–34.0)
MEAN PLATELET VOLUME: 11 fL — ABNORMAL HIGH (ref 7.0–10.0)
MONOCYTES ABSOLUTE COUNT: 0.5 10*9/L (ref 0.2–0.8)
MONOCYTES RELATIVE PERCENT: 11.5 %
NEUTROPHILS ABSOLUTE COUNT: 2.4 10*9/L (ref 2.0–7.5)
NEUTROPHILS RELATIVE PERCENT: 53.1 %
PLATELET COUNT: 85 10*9/L — ABNORMAL LOW (ref 150–440)
RED BLOOD CELL COUNT: 2.44 10*12/L — ABNORMAL LOW (ref 4.50–5.90)
RED CELL DISTRIBUTION WIDTH: 18.9 % — ABNORMAL HIGH (ref 12.0–15.0)
WBC ADJUSTED: 4.5 10*9/L (ref 4.5–11.0)

## 2020-03-03 LAB — BILIRUBIN TOTAL: Bilirubin:MCnc:Pt:Ser/Plas:Qn:: 0.4

## 2020-03-03 LAB — HYPOCHROMIA

## 2020-03-03 LAB — MAGNESIUM
MAGNESIUM: 1.8 mg/dL (ref 1.6–2.6)
Magnesium:MCnc:Pt:Ser/Plas:Qn:: 1.8

## 2020-03-03 LAB — SMEAR REVIEW

## 2020-03-03 MED ADMIN — heparin, porcine (PF) 100 unit/mL injection 200 Units: 200 [IU] | INTRAVENOUS | @ 12:00:00 | Stop: 2020-03-03

## 2020-03-03 MED ADMIN — sodium chloride 0.9% (NS) bolus 1,000 mL: 1000 mL | INTRAVENOUS | @ 13:00:00 | Stop: 2020-03-03

## 2020-03-03 MED ADMIN — heparin, porcine (PF) 100 unit/mL injection 200 Units: 200 [IU] | INTRAVENOUS | @ 14:00:00 | Stop: 2020-03-04

## 2020-03-03 NOTE — Unmapped (Signed)
Patient arrived to chair 4 for IVF.  No complaints noted.  Access of CVC intact with blood return.     Patient completed and tolerated treatment.  AVS declined and patient discharged to home.

## 2020-03-03 NOTE — Unmapped (Signed)
It was great seeing you today.     Your kidney function is still elevated and your potassium is up today so we will give you a fluid bolus. Continue to push your fluid intake.     Continue to take it slow with getting up to avoid falls.     --------------------------------------  21st Century Cures Act   Regarding labs and other test results, please know that due to federal laws, all test results are now released and available for review on MyChart immediately upon becoming available. This means that unlike before, you will now receive results before we have had the opportunity to review them and either call to discuss or send you message/letter with an explanation after all results are back.     For some patients, seeing test results without explanation can cause anxiety about those results and even misunderstanding if a patient interprets them incorrectly. We regret any such anxiety you may experience if you choose to review labs before we have discussed them with you. Please note, however, that to prevent anxiety, we recommend that you consider waiting to review your results until you receive a call, message, or letter from [place]. That makes sure that you receive the interpretation with your labs, which can help to avoid misunderstood results and perhaps undue anxiety. Either way, please know we monitor your test results closely.  ------------------------------------------  COVID-19 information:  Given ongoing novel coronavirus (SARS-CoV-2/COVID-19), it is strongly recommended to avoid travel and crowds.  If you develop fever, cough, shortness of breath and/or have known exposure (close contact < 6 feet) with someone who has tested positive, please notify us immediately.    ??? Your best defenses are to stay home as much as possible and use good hand hygiene.  ??? We recommend that you wear a mask (N95 mask preferred) when leaving your home  ??? Important:   o A previous negative test does not mean you will never become infected  o It is unknown if a prior COVID-19 infection provides immunity against future infections    It is important for you to know that the available vaccines are safe.  However, they have not been tested in transplant/cellular therapy patients.  Therefore, we do not know if transplant/cellular therapy patients will develop an immune response (make antibody) against the virus.  We still recommend you obtain the vaccine as long as you are at least 3 months after your transplant/cellular therapy.  We do not recommend the vaccine sooner than 3 months as it is very unlikely that you will respond.        We continue to strongly recommended you avoid travel and crowds.  You should always wear a mask (N95) if you are around anyone outside of your own home, use good hand hygiene, and practice social distancing (>6 feet).  For the foreseeable future, these things should continue even after you are vaccinated. If you develop fever, cough, shortness of breath and/or have known exposure (close contact < 6 feet) with someone who has tested positive, please notify us immediately.          Click Here to Visit [facility] Covid-19 Vaccine Hub  Get the latest facts on the COVID-19 vaccines.      Or visit [facility]???s COVID-19 Vaccine Hub at [email or web address] to review the latest facts about the vaccines.     -------------------------------------------------------------------------------  Return to clinic on Tuesday and Friday to see one of the providers. You will receive a time  when you check out today.    Lab Results   Component Value Date    WBC 4.5 03/03/2020    HGB 9.4 (L) 03/03/2020    HCT 28.0 (L) 03/03/2020    PLT 85 (L) 03/03/2020     Lab Results   Component Value Date    NA 141 03/03/2020    K 5.0 (H) 03/03/2020    CL 106 03/03/2020    CO2 29.0 03/03/2020    BUN 18 03/03/2020    CREATININE 1.64 (H) 03/03/2020    GLU 121 03/03/2020    CALCIUM 9.0 03/03/2020    MG 1.8 03/03/2020    PHOS 4.1 02/22/2020     Lab Results Component Value Date    BILITOT 0.4 03/03/2020    BILIDIR 0.30 02/21/2020    PROT 5.6 (L) 03/03/2020    ALBUMIN 3.0 (L) 03/03/2020    ALT 11 03/03/2020    AST 15 03/03/2020    ALKPHOS 89 03/03/2020    GGT 21 01/26/2020     Lab Results   Component Value Date    INR 1.21 02/22/2020    APTT 33.2 02/17/2020       For prescription refills:   For refills, please check your medication bottles to see if you have additional refills left. If so, please call your pharmacy and follow the directions to request a refill. If you do not have any refills left, please make a request during your clinic visit or by submitting a request through MyChart or by calling [phone]. Please allow 24 hours if your request is made during the week or 48 hours if requests are made on the weekends or holidays.     --------------------------------------------------------------------------------------------------------------------  For appointments & questions Monday through Friday 8 AM-4:30 PM     Please call [phone] or Toll free [phone]    On Nights, Weekends and Holidays  Call [phone] and ask for the oncologist on call    Please visit PrivacyFever.cz, a resource created just for family members and caregivers.  This website lists support services, how and where to ask for help. It has tools to assist you as you help [place] care for your loved one.    [facility]  [address]  [place], [place] [address]  [email or web address]

## 2020-03-03 NOTE — Unmapped (Signed)
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  Transplant was tolerated relatively well and primarily complicated by mucositis requiring PCA.    Interval History:   [name] 59 y.o. male presenting to clinic for f/u with his fiance. No acute events since last visit and feeling much more like himself. Reports slightly improved overall fatigue, significant improvement in oral mucositis smx w/ improved PO intake, and improvement in orthostatic hypotension when he takes his time standing up. Intermittent nausea controlled well with anti-emetics; states he notices the nausea a little more when he drinks too much fluid too quickly. With his improvement in oral mucositis smx, he has been able to drink between 3-4 water bottles a day in addition to protein drinks (Boost, tropical smoothie smoothies, and home-made smoothies). Pt has needed the protein drinks d/t a reported almost absent appetite, but is tolerating the food PO. Pt's main smx today is ongoing paresthesias in his palms and soles of his feet bilaterally. The paraesthesias are intermittent, and slightly improved with his Lyrica but is still having on going symptoms. Denies fever, chills, flu-like smx, headaches, vision changes, syncope, eye/skin irritation/dryness, substernal chest discomfort, sob, abdominal pain, diarrhea, hemoptysis, hematochezia, hematuria, dysuria, or uni/bilateral LE extremity swelling.    Pt states he accidentally took his Tac dose this morning prior to Tac monitoring today.    Pt states he did not receive any Covid-19 vaccine doses prior to transplant, and would prefer to not receive any doses in the future.    ROS:  Comprehensive ROS negative except pertinent positives listed in interval history.     I reviewed and updated past medical, surgical, social, and family history as appropriate.       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,XY[1]    Variants of Known or Likely Clinical Significance  Gene Transcript  Predicted Protein  VAF (%)   CEBPA c.[id number] p.Ser251Argfs 41      Variants of Unknown Significance  Gene Transcript Predicted Protein VAF (%)   TET2 c.4946A>G p.Tyr1649Cys 49      -  FLT-3-ITD and FLT-3-TKD studies are negative    Pertinent Phenotypic data:    Disease-specific prognostic estimate:     Induction:   7+3+HD Dauno (C1D1 2/24)    Recovery Marrow:    Final Diagnosis   Date Value Ref Range Status   08/12/2019   Final    Bone marrow, right iliac, aspiration and biopsy  -  Normocellular bone marrow (60%) with trilineage hematopoiesis and 4% blasts by manual aspirate differential  -  Routine cytogenetic analysis is pending  -  Flow cytometric MRD analysis is pending    This electronic signature is attestation that the pathologist personally reviewed the submitted material(s) and the final diagnosis reflects  that evaluation.            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,XY[1]  Variants of Known or Likely Clinical Significance  Gene Transcript  Predicted Protein  VAF (%)   CEBPA c.753_762del p.Ser251Argfs 41      Variants of Unknown Significance  Gene Transcript Predicted Protein VAF (%)   TET2 c.4946A>G p.Tyr1649Cys 49      -  FLT-3-ITD and FLT-3-TKD studies are negative     07/09/2019 Initial Diagnosis    AML (acute myeloid leukemia) (CMS-HCC)     07/14/2019 - 07/20/2019 Chemotherapy    IP LEUKEMIA 7+3 HIGH DOSE DAUNOrubicin  DAUNOrubicin 90 mg/m2 IV on Days 1, 2, 3  Cytarabine 100 mg/m2 CIVI on Days 1 to 7     07/27/2019 Biopsy    Bone marrow, right iliac, aspiration and biopsy  -  Hypocellular bone marrow (<5%) with treatment effect, markedly reduced trilineage hematopoiesis, and 3% blasts by manual aspirate differential (see Comment).     08/24/2019 - 09/28/2019 Chemotherapy    IP LEUKEMIA HIGH DOSE CYTARABINE CONSOLIDATION (3 G/M2)  cytarabine 3 g/m2 every 12 hours on days 1, 3 and 5 every 28 days     08/24/2019 - 12/26/2019 Chemotherapy    IP LEUKEMIA HIGH DOSE CYTARABINE CONSOLIDATION (3 G/M2) ON DAYS 1,2,3  cytarabine 3 g/m2 IV every 12 hours on days 1, 2, 3, every 28 days     01/19/2020 - 01/19/2020 Chemotherapy    BMT OP BUSULFAN TEST DOSE  Busulfan 0.8 mg/kg IV ONCE     01/26/2020 -  Chemotherapy    BMT IP MAC BUSULFAN / FLUDARABINE / rATG (MUD)   Busulfan IV Days -6 to -3  Fludarabine 40 mg/m2 IV Days -6 to -3  rATG 0.5 mg/kg IV Day -3, 1.5 mg/kg IV Day -2, 2.5 mg/kg IV Day -1       Physical exam:Temp:  [37 ??C (98.6 ??F)] 37 ??C (98.6 ??F)  Heart Rate:  [103] 103  BP: (91)/(59) 91/59   BP 91/59  - Pulse 103  - Temp 37 ??C (98.6 ??F) (Oral)  - Resp 16  - Wt 75.7 kg (166 lb 12.8 oz)  - SpO2 100%  - BMI 26.12 kg/m??   70, Cares for self; unable to carry on normal activity or to do active work (ECOG equivalent 1)    General: No acute distress noted.   Central venous access: Line c/d/i. No erythema or drainage noted. Port site c/d/i.  ENT: Moist mucous membranes, healing mucositis w/ any obvious ulcers. Coated tongue.    Cardiovascular: HR WNL with regular rate and rhythm. S1 and S2 normal, without any murmur, rub, or gallop.   Lungs: Clear to auscultation bilaterally, without wheezes/crackles/rhonchi. Good air movement.   Skin: Warm, dry, intact. No rash noted.    Psychiatry: Alert and  oriented to person, place, and time.   Gastrointestinal/Abdomen: Normoactive bowel sounds, abdomen soft, non-tender. No guarding, rebound, distention, rigidity noted.  Musculoskeletal/Extremities: FROM throughout. 1+ edema bilaterally around ankles up to mid shin.   Neurologic: CNII-XII intact. Normal strength and sensation throughout. No sensation changes noted on bilateral palms or bilateral soles during physical exam (paraesthesias were not active while accessing Pt's sensation).    Lab Results   Component Value Date    WBC 4.5 03/03/2020    HGB 9.4 (L) 03/03/2020    HCT 28.0 (L) 03/03/2020    PLT 85 (L) 03/03/2020     Lab Results   Component Value Date    NA 141 03/03/2020    K 5.0 (H) 03/03/2020    CL 106 03/03/2020    CO2 29.0 03/03/2020    BUN 18 03/03/2020    CREATININE 1.64 (H) 03/03/2020    GLU 121 03/03/2020    CALCIUM 9.0 03/03/2020    MG 1.8 03/03/2020    PHOS 4.1 02/22/2020     Lab Results   Component Value Date    BILITOT 0.4 03/03/2020    BILIDIR 0.30 02/21/2020    PROT 5.6 (L) 03/03/2020    ALBUMIN 3.0 (L) 03/03/2020    ALT 11 03/03/2020    AST 15 03/03/2020    ALKPHOS 89 03/03/2020    GGT 21 01/26/2020     Lab Results   Component Value Date    PT 14.1 (H) 02/22/2020    INR 1.21 02/22/2020    APTT 33.2 02/17/2020     Assessment and Plan    BMT:??AML, CR MRD Negative  HCT-CI (age adjusted) 5??(psych, severe pulmonary dysfunction, and age)   Conditioning:??MAC Bu/Flu/ATG  Donor:??9/10, ABO O+, CMV+  Engraftment:??Granix starting D+12 through engraftment (as defined as ANC 1.0 x 2 days or 3.0 x 1 day)  - Date of last granix injection: 02/19/20    GVHD prophylaxis:??  1.Tacrolimus being managed by pharmacy with a goal of 5-10 ng/mL  2. Methotrexate??15 mg/m2 IVP on day +1 then 10 mg/m2 on days +3, +6 and +11  3. ATG per [facility] standard dosing will??be included  ??  Heme:??  Transfusion criteria:??1 unit of PRBCs for Hgb<7 and 1 unit of platelets for Plt <10K or bleeding.   -??No history of transfusion reactions.     ID:??  Prophylaxis:  - Antiviral: Continue Valtrex 500 mg po daily   - Antifungal: Continue Fluconazole 400 mg po daily, continue through day +75  - PJP: Dapsone (has Sulfa allergy) upon platelet engraftment.   ????** G6PD normal, 8.3  - CMV D+/R+: Continue Letermovir prophy, 480mg  po daily, continue through D+100 (Being delivered today to house, dose given in clinic)  ??  Neutropenic Fever:  02/03/20: Tmax 38.4 Cefepime (02/03/20 - 02/09/20)  - Cultures NGx 5 days  ??  02/13/20: Spiked again at 5am 9/26  - Cultures negative at 5 days  - Started on IV cefepime (9/26- 10/1) and IV flagyl (anaerobic coverage given mucositis) (9/26-10/1). Received a dose of IV vanc but no indication to continue.   ??  Allergy:  - PCN allergy (hives) has tolerated Cefepime without issues  ??  EBV:  - IgM Ab + 01/05/20 but viral load 01/05/20 not detected.   - Viral Load 10/13 not detected.    GI:??  GERD prophylaxis:  - Continue Pepcid BID  ??  Nausea:  - Continue Anti-emetics per BMT protocol.   ??  Mucositis (chemotherapy related):  - Improved;  prn pain control   - Minimal smx; not restricting PO intake at this time  ??  Renal:   AKI:   -02/25/20: SCr up to 1.8 today. Likely from supratherapeutic tacrolimus level plus decreased fluid intake due to resolving mucositis. Will give a liter of IV fluids and instructed to push fluids over the weekend.   -10/13:  Cr is 1.7.  This is now stable and hopefully plateauing from tacrolimus supratherpeutic level.  Encouraged him to continue to drink well and we will monitor closely as tacrolimus levels come down.  - 10/15: Cr is 1.6, K 5.0. Kidney function is continuing to improve, and will continue to encourage pushing PO fluids to see further improvement in Kidney function. Pt to receive fluids today in infusion clinic d/t increased K, soft BP, and continued orthostatic hypotension.  ??  FEN:   ??Electrolytes:  - Stop oral potassium 10/8 due to high level from AKI   - 10/15: K 5.0. Continue holding.  - Continue oral magnesium   ??  Supplements: Held on admission  - Pt states he has not been taking: B complex, Vitamin D  ??  Hepatic:??  - No active issues.   - VOD prophylaxis with Actigall TID.     CV:    HTN: New, possibly tac related  - Amlodipine 5mg  daily    Pulm: DLCO 62%  - Former smoker, quit 01/12/20. Using nicotine patch currently. Has chronic dry cough but PFTs were acceptable.   07/24/19: CT chest w/ 0.6cm RUL nodules, no clear etiology.     ** Discussed with Dr. Oswald Hillock prior to transplant, ok to move forward, no plans to repeat unless new findings.       Neuro/Pain:??  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   - Bilateral palms and feet intermittent paraesthesias: Will continue Lyrica dose today and reevaluate smx at next visit for dose adjustment.    Mucositis: (chemotherapy induced)   Grade 3,  Dilaudid PCA (AKI so Dilaudid chosen over Morphine)   - 9/23 started with 0.5 mg PCA dose q 15 min prn, titrate up as needed  - 9/27: Increased bolus to 0.75 mg q49min  -9/28: Decreased back to 0.5 dosing per patient request as sleeping too frequently  -9/30: Decrease to 0.25 for pt request with improvment   -10/4: Stopped PCA, will transition back to oral oxy 10 mg po q 4 hr prn, will also have iv prn dilaudid for severe pain  -10/5: Received oxy this morning with good relief.   -10/8: Resolving, took one tylenol last night and instructed to use oxycodone   -10/11: Decrease oxycodone to 5 mg vs 10 mg   - No current pain, has Oxycodone 5mg  PRN     Psychosocial:??  - Substance abuse (opioid history). Sober for 5 years.  - Followed by Frederik Schmidt in [facility], placed referral for [facility] while admitted, are not seeing inpatients, will follow- up with him post discharge.   - Insomnia: Melatonin??3mg  and Trazodone 50mg  nightly prn.   - Anxiety: Hospitalization related. Well controlled.     ** Klonopin 0.5/1mg  daily, at discharge will stop morning dose and keep evening dose.   ??  Summary: Plan as above D+30    - RTC this coming Tuesday 10/19 with APP, then Friday 10/22.  - Future Appointments listed below.      Zipporah Plants, PA-S  Adult Bone Marrow Transplantation    Future Appointments   Date Time Provider  Department Center   03/03/2020  9:30 AM ONCINF CHAIR 06 [id number]   03/07/2020  7:30 AM ONCBMT LABS [facility]   03/07/2020  8:00 AM ONCBMT APP A [facility]   03/07/2020  9:30 AM ONCINF CHAIR 06 [id number]   03/10/2020  7:30 AM ONCBMT LABS [facility]   03/10/2020  8:00 AM ONCBMT APP A [facility]   03/10/2020  9:30 AM ONCINF CHAIR 06 [id number]   03/15/2020  9:15 AM ONCBMT LABS [facility]   03/15/2020  9:45 AM Artelia Laroche, MD [facility]   03/15/2020 10:30 AM ONCINF CHAIR 02 [id number]   04/11/2020  8:15 AM ONCBMT LABS [facility]   04/11/2020  8:45 AM Artelia Laroche, MD [facility]   04/11/2020 10:00 AM ONCINF CHAIR 03 [id number]   04/26/2020  7:45 AM ONCBMT LABS [facility]   04/26/2020  8:15 AM ONCBMT APP B [facility]   04/26/2020  9:30 AM Bernerd Limbo, FNP [id number]   05/03/2020  9:15 AM ONCBMT LABS [facility]   05/03/2020  9:45 AM Artelia Laroche, MD [facility]

## 2020-03-04 DIAGNOSIS — M8580 Other specified disorders of bone density and structure, unspecified site: Principal | ICD-10-CM

## 2020-03-07 ENCOUNTER — Encounter: Admit: 2020-03-07 | Discharge: 2020-03-08 | Payer: PRIVATE HEALTH INSURANCE

## 2020-03-07 ENCOUNTER — Ambulatory Visit: Admit: 2020-03-07 | Discharge: 2020-03-08 | Payer: PRIVATE HEALTH INSURANCE

## 2020-03-07 DIAGNOSIS — N179 Acute kidney failure, unspecified: Principal | ICD-10-CM

## 2020-03-07 DIAGNOSIS — Z9484 Stem cells transplant status: Principal | ICD-10-CM

## 2020-03-07 DIAGNOSIS — C9201 Acute myeloblastic leukemia, in remission: Principal | ICD-10-CM

## 2020-03-07 DIAGNOSIS — M8580 Other specified disorders of bone density and structure, unspecified site: Principal | ICD-10-CM

## 2020-03-07 DIAGNOSIS — D84822 Immunocompromised state associated with stem cell transplant (CMS-HCC): Principal | ICD-10-CM

## 2020-03-07 DIAGNOSIS — D649 Anemia, unspecified: Principal | ICD-10-CM

## 2020-03-07 DIAGNOSIS — E875 Hyperkalemia: Principal | ICD-10-CM

## 2020-03-07 DIAGNOSIS — D696 Thrombocytopenia, unspecified: Principal | ICD-10-CM

## 2020-03-07 DIAGNOSIS — T829XXA Unspecified complication of cardiac and vascular prosthetic device, implant and graft, initial encounter: Principal | ICD-10-CM

## 2020-03-07 LAB — CBC W/ AUTO DIFF
BASOPHILS ABSOLUTE COUNT: 0 10*9/L (ref 0.0–0.1)
EOSINOPHILS ABSOLUTE COUNT: 0.1 10*9/L (ref 0.0–0.4)
EOSINOPHILS RELATIVE PERCENT: 1.6 %
HEMATOCRIT: 28.5 % — ABNORMAL LOW (ref 41.0–53.0)
HEMOGLOBIN: 9.3 g/dL — ABNORMAL LOW (ref 13.5–17.5)
LARGE UNSTAINED CELLS: 4 % (ref 0–4)
LYMPHOCYTES ABSOLUTE COUNT: 0.9 10*9/L — ABNORMAL LOW (ref 1.5–5.0)
LYMPHOCYTES RELATIVE PERCENT: 20.2 %
MEAN CORPUSCULAR HEMOGLOBIN CONC: 32.5 g/dL (ref 31.0–37.0)
MEAN CORPUSCULAR HEMOGLOBIN: 37.2 pg — ABNORMAL HIGH (ref 26.0–34.0)
MEAN CORPUSCULAR VOLUME: 114.3 fL — ABNORMAL HIGH (ref 80.0–100.0)
MEAN PLATELET VOLUME: 10.6 fL — ABNORMAL HIGH (ref 7.0–10.0)
MONOCYTES ABSOLUTE COUNT: 0.4 10*9/L (ref 0.2–0.8)
MONOCYTES RELATIVE PERCENT: 9.9 %
NEUTROPHILS ABSOLUTE COUNT: 2.9 10*9/L (ref 2.0–7.5)
NEUTROPHILS RELATIVE PERCENT: 64.4 %
RED CELL DISTRIBUTION WIDTH: 19 % — ABNORMAL HIGH (ref 12.0–15.0)
WBC ADJUSTED: 4.5 10*9/L (ref 4.5–11.0)

## 2020-03-07 LAB — COMPREHENSIVE METABOLIC PANEL
ALBUMIN: 3 g/dL — ABNORMAL LOW (ref 3.4–5.0)
ANION GAP: 5 mmol/L (ref 5–14)
AST (SGOT): 14 U/L (ref <=34)
BILIRUBIN TOTAL: 0.4 mg/dL (ref 0.3–1.2)
BLOOD UREA NITROGEN: 19 mg/dL (ref 9–23)
BUN / CREAT RATIO: 12
CALCIUM: 9.1 mg/dL (ref 8.7–10.4)
CHLORIDE: 109 mmol/L — ABNORMAL HIGH (ref 98–107)
CO2: 27 mmol/L (ref 20.0–31.0)
CREATININE: 1.55 mg/dL — ABNORMAL HIGH
EGFR CKD-EPI AA MALE: 57 mL/min/{1.73_m2} — ABNORMAL LOW (ref >=60)
EGFR CKD-EPI NON-AA MALE: 49 mL/min/{1.73_m2} — ABNORMAL LOW (ref >=60)
GLUCOSE RANDOM: 101 mg/dL (ref 70–179)
PROTEIN TOTAL: 5.7 g/dL (ref 5.7–8.2)
SODIUM: 141 mmol/L (ref 135–145)

## 2020-03-07 LAB — SMEAR REVIEW

## 2020-03-07 LAB — TACROLIMUS, TROUGH: Lab: 6

## 2020-03-07 LAB — WBC ADJUSTED: Leukocytes:NCnc:Pt:Bld:Qn:: 4.5

## 2020-03-07 LAB — CALCIUM: Calcium:MCnc:Pt:Ser/Plas:Qn:: 9.1

## 2020-03-07 LAB — MAGNESIUM: Magnesium:MCnc:Pt:Ser/Plas:Qn:: 1.8

## 2020-03-07 MED ORDER — DAPSONE 100 MG TABLET
ORAL_TABLET | Freq: Every day | ORAL | 5 refills | 30 days | Status: CP
Start: 2020-03-07 — End: 2020-09-03

## 2020-03-07 MED ADMIN — heparin, porcine (PF) 100 unit/mL injection 200 Units: 200 [IU] | INTRAVENOUS | @ 12:00:00 | Stop: 2020-03-07

## 2020-03-07 NOTE — Unmapped (Signed)
Bone Marrow Transplant and Cellular Therapy Program  Immunosuppressive Therapy Note    John Erickson is a 59 y.o. 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 John Erickson is currently day +34.    Current dose: 1 mg PO BID (dose decreased on 10/13)    Goal tacrolimus Level: 5-10 ng/mL    Resulted level: 6.0 ng/mL (patient missed his dose last evening and held this morning's dose prior to labs)     Lab Results   Component Value Date/Time    TACROLIMUS 6.0 03/07/2020 07:29 AM    TACROLIMUS 9.6 03/01/2020 10:15 AM    TACROLIMUS 10.2 02/25/2020 07:35 AM     Lab Results   Component Value Date/Time    CREATININE 1.55 (H) 03/07/2020 07:29 AM    CREATININE 1.64 (H) 03/03/2020 08:05 AM    CREATININE 1.77 (H) 03/01/2020 10:15 AM     Tacrolimus level is therapeutic, although true trough is higher as patient missed last night's dose. Renal function is improved and downtrending. Plan to continue tacrolimus dose to 1 mg PO twice daily and recheck tacrolimus level and renal function at next clinic visit.     We will continue to monitor levels.  Patient will be followed for changes in renal and hepatic function, toxicity, and efficacy.     Eldridge Abrahams, PharmD  PGY1 Pharmacy Resident    I have reviewed and agree with the resident plan.    Bettey Costa, PharmD, BCPS, BCOP  BMT Clinical Pharmacist Practitioner

## 2020-03-07 NOTE — Unmapped (Addendum)
We will get a chest xray today to make sure the central line is still in the right place. We will also have you transition to a more sensitive dressing.     Your kidney function is improved, continue to push your fluid intake.     I will call you if I need to make changes to your tacrolimus dose.     After discharge from the hospital, there may be times when you might need blood products, fluids or electrolyte replacement and other things requiring an appointment in our infusion area after your lab draw and provider visit.  To make waiting times less, we will schedule all recently discharged patients in infusion. Your provider will let you know after looking at your lab work whether the appointment will be needed or not.   --------------------------------------  21st Century Cures Act   Regarding labs and other test results, please know that due to federal laws, all test results are now released and available for review on MyChart immediately upon becoming available. This means that unlike before, you will now receive results before we have had the opportunity to review them and either call to discuss or send you message/letter with an explanation after all results are back.     For some patients, seeing test results without explanation can cause anxiety about those results and even misunderstanding if a patient interprets them incorrectly. We regret any such anxiety you may experience if you choose to review labs before we have discussed them with you. Please note, however, that to prevent anxiety, we recommend that you consider waiting to review your results until you receive a call, message, or letter from [place]. That makes sure that you receive the interpretation with your labs, which can help to avoid misunderstood results and perhaps undue anxiety. Either way, please know we monitor your test results closely.  ------------------------------------------  COVID-19 information:  Given ongoing novel coronavirus (SARS-CoV-2/COVID-19), it is strongly recommended to avoid travel and crowds.  If you develop fever, cough, shortness of breath and/or have known exposure (close contact < 6 feet) with someone who has tested positive, please notify us immediately.    ??? Your best defenses are to stay home as much as possible and use good hand hygiene.  ??? We recommend that you wear a mask (N95 mask preferred) when leaving your home  ??? Important:   o A previous negative test does not mean you will never become infected  o It is unknown if a prior COVID-19 infection provides immunity against future infections    It is important for you to know that the available vaccines are safe.  However, they have not been tested in transplant/cellular therapy patients.  Therefore, we do not know if transplant/cellular therapy patients will develop an immune response (make antibody) against the virus.  We still recommend you obtain the vaccine as long as you are at least 3 months after your transplant/cellular therapy.  We do not recommend the vaccine sooner than 3 months as it is very unlikely that you will respond.        We continue to strongly recommended you avoid travel and crowds.  You should always wear a mask (N95) if you are around anyone outside of your own home, use good hand hygiene, and practice social distancing (>6 feet).  For the foreseeable future, these things should continue even after you are vaccinated. If you develop fever, cough, shortness of breath and/or have known exposure (close contact < 6  feet) with someone who has tested positive, please notify us immediately.          Click Here to Visit The [facility] Covid-19 Vaccine Hub  Get the latest facts on the COVID-19 vaccines.      Or visit [facility]???s COVID-19 Vaccine Hub at [email or web address] to review the latest facts about the vaccines.     -------------------------------------------------------------------------------  Return to clinic on Friday to see one of the providers as scheduled.     Lab Results   Component Value Date    WBC 4.5 03/07/2020    HGB 9.3 (L) 03/07/2020    HCT 28.5 (L) 03/07/2020    PLT 84 (L) 03/07/2020     Lab Results   Component Value Date    NA 141 03/07/2020    K 4.6 (H) 03/07/2020    CL 109 (H) 03/07/2020    CO2 27.0 03/07/2020    BUN 19 03/07/2020    CREATININE 1.55 (H) 03/07/2020    GLU 101 03/07/2020    CALCIUM 9.1 03/07/2020    MG 1.8 03/07/2020    PHOS 4.1 02/22/2020     Lab Results   Component Value Date    BILITOT 0.4 03/07/2020    BILIDIR 0.30 02/21/2020    PROT 5.7 03/07/2020    ALBUMIN 3.0 (L) 03/07/2020    ALT 8 (L) 03/07/2020    AST 14 03/07/2020    ALKPHOS 79 03/07/2020    GGT 21 01/26/2020     Lab Results   Component Value Date    INR 1.21 02/22/2020    APTT 33.2 02/17/2020       For prescription refills:   For refills, please check your medication bottles to see if you have additional refills left. If so, please call your pharmacy and follow the directions to request a refill. If you do not have any refills left, please make a request during your clinic visit or by submitting a request through MyChart or by calling [phone]. Please allow 24 hours if your request is made during the week or 48 hours if requests are made on the weekends or holidays.     --------------------------------------------------------------------------------------------------------------------  For appointments & questions Monday through Friday 8 AM-4:30 PM     Please call [phone] or Toll free [phone]    On Nights, Weekends and Holidays  Call [phone] and ask for the oncologist on call    Please visit PrivacyFever.cz, a resource created just for family members and caregivers.  This website lists support services, how and where to ask for help. It has tools to assist you as you help [place] care for your loved one.    [facility]  [address]  [place], [place] [address]  [email or web address]

## 2020-03-07 NOTE — Unmapped (Signed)
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  Transplant was tolerated relatively well and primarily complicated by mucositis requiring PCA.    Interval History:   John Erickson is a 59 y.o. male presenting to clinic for f/u with his fiance. No acute events since last visit, and is continuing to feel better each day. Reports continued improvement with orthostatic hypotension smx, improvement with oral mucositis smx, and improvement with overall fatigue. Pt and fiance had concern for potential lengthening of the central-line and were wondering if the placement could be reassessed. There was also concern for skin irritation/bleeding with the current dressing regimen and were wondering if there were alternatives d/t recent dressing change issue. Continues to have intermittent nausea not associated with food or fluids that is well controlled with anti-emetics. Reports improvement with ongoing paresthesias on palms and soles of feet bilaterally, but well controlled with Lyrica. States he has been drinking 2-3 water bottles per day along with multiple glasses of milk, protein drinks, and soda. Denies fever, chills, flu-like smx, headaches, vision changes, syncope, eye/skin irritation/dryness, substernal chest discomfort, sob, abdominal pain, diarrhea, hemoptysis, hematochezia, hematuria, dysuria, or unilateral LE extremity swelling.    Pt states he did not take his tac dose this morning in preparation for having his Tac level checked, but forgot to take his evening dose of Tac on 10/18 as well.    ROS:  A comprehensive ROS performed and is negative except for pertinent positives as listed above in interval history.     I reviewed and updated past medical, surgical, social, and family history as appropriate.       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,XY[1]    Variants of Known or Likely Clinical Significance  Gene Transcript  Predicted Protein  VAF (%)   CEBPA c.753_762del p.Ser251Argfs 41      Variants of Unknown Significance  Gene Transcript Predicted Protein VAF (%)   TET2 c.4946A>G p.Tyr1649Cys 49      -  FLT-3-ITD and FLT-3-TKD studies are negative    Pertinent Phenotypic data:    Disease-specific prognostic estimate:     Induction:   7+3+HD Dauno (C1D1 2/24)    Recovery Marrow:    Final Diagnosis   Date Value Ref Range Status   08/12/2019   Final    Bone marrow, right iliac, aspiration and biopsy  -  Normocellular bone marrow (60%) with trilineage hematopoiesis and 4% blasts by manual aspirate differential  -  Routine cytogenetic analysis is pending  -  Flow cytometric MRD analysis is pending    This electronic signature is attestation that the pathologist personally reviewed the submitted material(s) and the final diagnosis reflects that evaluation.            Genetics:  Karyotype/FISH:   RESULTS   Date Value Ref Range Status   07/09/2019   Final    Abnormal Karyotype: 46,Y,t(X;8)(q26;q11.2)[19]/46,XY[1]    Normal FISH:  An interphase FISH assay shows no evidence of a rearrangement involving the KMT2A (MLL) gene region in the 200 nuclei scored (see below).           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,XY[1]  Variants of Known or Likely Clinical Significance  Gene Transcript  Predicted Protein  VAF (%)   CEBPA c.753_762del p.Ser251Argfs 41      Variants of Unknown Significance  Gene Transcript Predicted Protein VAF (%)   TET2 c.4946A>G p.Tyr1649Cys 49      -  FLT-3-ITD and FLT-3-TKD studies are negative     07/09/2019 Initial Diagnosis    AML (acute myeloid leukemia) (CMS-HCC)     07/14/2019 - 07/20/2019 Chemotherapy    IP LEUKEMIA 7+3 HIGH DOSE DAUNOrubicin  DAUNOrubicin 90 mg/m2 IV on Days 1, 2, 3  Cytarabine 100 mg/m2 CIVI on Days 1 to 7     07/27/2019 Biopsy    Bone marrow, right iliac, aspiration and biopsy  -  Hypocellular bone marrow (<5%) with treatment effect, markedly reduced trilineage hematopoiesis, and 3% blasts by manual aspirate differential (see Comment).     08/24/2019 - 09/28/2019 Chemotherapy    IP LEUKEMIA HIGH DOSE CYTARABINE CONSOLIDATION (3 G/M2)  cytarabine 3 g/m2 every 12 hours on days 1, 3 and 5 every 28 days     08/24/2019 - 12/26/2019 Chemotherapy    IP LEUKEMIA HIGH DOSE CYTARABINE CONSOLIDATION (3 G/M2) ON DAYS 1,2,3  cytarabine 3 g/m2 IV every 12 hours on days 1, 2, 3, every 28 days     01/19/2020 - 01/19/2020 Chemotherapy    BMT OP BUSULFAN TEST DOSE  Busulfan 0.8 mg/kg IV ONCE     01/26/2020 -  Chemotherapy    BMT IP MAC BUSULFAN / FLUDARABINE / rATG (MUD)   Busulfan IV Days -6 to -3  Fludarabine 40 mg/m2 IV Days -6 to -3  rATG 0.5 mg/kg IV Day -3, 1.5 mg/kg IV Day -2, 2.5 mg/kg IV Day -1       Physical exam:Temp:  [36.8 ??C (98.2 ??F)] 36.8 ??C (98.2 ??F)  Heart Rate:  [73] 73  BP: (100)/(81) 100/81   BP 100/81  - Pulse 73  - Temp 36.8 ??C (98.2 ??F) (Oral)  - Resp 18  - Ht 170.2 cm (5' 7.01)  - Wt 76.5 kg (168 lb 9.6 oz)  - SpO2 100%  - BMI 26.40 kg/m??   70, Cares for self; unable to carry on normal activity or to do active work (ECOG equivalent 1)    General: No acute distress noted.   Central Venous Access: Line clean, dry, intact. No drainage or induration noted. Minimal erythema and irritation of skin surrounding outline of previous line dressing. Mild TTP over erythema/irritation. Potential line length discrepancy per patient.   ENT: Moist mucous membranes. Oropharhynx without lesions, erythema or exudate.   Cardiovascular: Pulse normal rate, regularity and rhythm. S1 and S2 normal, without any murmur, rub, or gallop.  Lungs: Clear to auscultation bilaterally, without wheezes/crackles/rhonchi. Good air movement.   Skin: Warm, dry, intact. Mild  erythema/irritation around the central-line dressing outlining previous dressing.  Psychiatry: Alert and oriented to person, place, and time.   Gastrointestinal/Abdomen: Normoactive bowel sounds, abdomen soft, non-tender   Musculoskeletal/Extremities: FROM throughout. Trace bilateral LE edema  Neurologic: CNII-XII intact. Normal strength and sensation throughout.      I reviewed all labs from today in Epic. See EMR for lab results.    Lab Results   Component Value Date    WBC 4.5 03/07/2020    HGB 9.3 (L) 03/07/2020    HCT 28.5 (L) 03/07/2020    PLT 84 (L) 03/07/2020     Lab Results   Component Value Date    NA 141 03/07/2020    K 4.6 (H) 03/07/2020    CL 109 (H) 03/07/2020    CO2 27.0 03/07/2020    BUN 19 03/07/2020    CREATININE 1.55 (H) 03/07/2020    GLU 101 03/07/2020    CALCIUM 9.1 03/07/2020    MG 1.8 03/07/2020    PHOS 4.1 02/22/2020     Lab Results   Component Value Date    BILITOT 0.4 03/07/2020    BILIDIR 0.30 02/21/2020    PROT 5.7 03/07/2020    ALBUMIN 3.0 (L) 03/07/2020    ALT 8 (L) 03/07/2020    AST 14 03/07/2020    ALKPHOS 79 03/07/2020    GGT 21 01/26/2020     Lab Results   Component Value Date    PT 14.1 (H) 02/22/2020    INR 1.21 02/22/2020    APTT 33.2 02/17/2020     Assessment and Plan    BMT:??AML, CR MRD Negative  HCT-CI (age adjusted) 5??(psych, severe pulmonary dysfunction, and age)   Conditioning:??MAC Bu/Flu/ATG  Donor:??9/10, ABO O+, CMV+  Engraftment:??Granix starting D+12 through engraftment (as defined as ANC 1.0 x 2 days or 3.0 x 1 day)  - Date of last granix injection: 02/19/20    GVHD prophylaxis:??  1.Tacrolimus being managed by pharmacy with a goal of 5-10 ng/mL  2. Methotrexate??15 mg/m2 IVP on day +1 then 10 mg/m2 on days +3, +6 and +11  3. ATG per [facility] standard dosing will??be included  ??  Heme:??  Transfusion criteria:??1 unit of PRBCs for Hgb<7 and 1 unit of platelets for Plt <10K or bleeding.   -??No history of transfusion reactions.     CVL:  - Patient notes new skin irritation with most recent dressing change.   - He also feels his lumens are lower than they were when he was inpatient.   - Will obtain CXR today to assess line placement.     ** CXR showed maintained appropriate line placement.     ID:??  Prophylaxis:  - Antiviral: Continue Valtrex 500 mg po daily   - Antifungal: Continue Fluconazole 400 mg po daily, continue through day +75  - PJP: Will start Dapsone 100mg  PO daily (has Sulfa allergy).  ????** G6PD normal, 8.3  - CMV D+/R+: Continue Letermovir prophy, 480mg  po daily, continue through D+100 (Being delivered today to house, dose given in clinic)  ??  Neutropenic Fever:  02/03/20: Tmax 38.4 Cefepime (02/03/20 - 02/09/20)  - Cultures NGx 5 days  ??  02/13/20: Spiked again at 5am 9/26  - Cultures negative at 5 days  - Started on IV cefepime (9/26- 10/1) and IV flagyl (anaerobic coverage given mucositis) (9/26-10/1). Received a dose of IV vanc but no indication to continue.   ??  Allergy:  - PCN allergy (hives) has tolerated Cefepime without issues  ??  EBV:  - IgM Ab + 01/05/20 but viral load 01/05/20 not detected.   - Viral Load 10/13 not detected.    GI:??  GERD prophylaxis:  - Continue Pepcid BID  ??  Nausea:  - Continue Anti-emetics per BMT protocol.   ??  Mucositis (chemotherapy related):  - Continuing to improve; prn pain control   - Minimal smx; not restricting PO intake at this time  ??  Renal:   AKI: -02/25/20: SCr up to 1.8 today. Likely from supratherapeutic tacrolimus level plus decreased fluid intake due to resolving mucositis. Will give a liter of IV fluids and instructed to push fluids over the weekend.   -10/13:  SCr is 1.7.  This is now stable and hopefully plateauing from tacrolimus supratherpeutic level.  Encouraged him to continue to drink well and we will monitor closely as tacrolimus levels come down.  - 10/15: SCr is 1.6, K 5.0. Kidney function is continuing to improve, and will continue to encourage pushing PO fluids to see further improvement in Kidney function. Pt to receive fluids today in infusion clinic d/t increased K, soft BP, and continued orthostatic hypotension.  - 10/19: Scr is 1.55. Kidney function is continuing to improve, and providers will continue to encourage pushing PO fluids to see further improvement in kidney function. Pt did not receive fluids today d/t improvement in Scr, K, and orthostatic blood pressures.  ??  FEN:   ??Electrolytes:  - Stopped oral potassium 10/8 due to high level from AKI   - 10/19: K 4.6. Continue holding.  - Continue oral magnesium   ??  Supplements: Held on admission  - Pt states he has not been taking: B complex, Vitamin D  ??  Hepatic:??  - No active issues.   - VOD prophylaxis with Actigall TID.     CV:    HTN: New, possibly tac related  - Continue Amlodipine 5mg  daily   - Trace LE edema    Pulm: DLCO 62%  - Former smoker, quit 01/12/20. Using nicotine patch currently. Has chronic dry cough but PFTs were acceptable.   07/24/19: CT chest w/ 0.6cm RUL nodules, no clear etiology.     ** Discussed with Dr. Oswald Hillock prior to transplant, ok to move forward, no plans to repeat unless new findings.       Neuro/Pain:??  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   - Bilateral palms and feet intermittent paraesthesias: Will continue Lyrica dose today and reevaluate smx at next visit for dose adjustment.    Mucositis: (chemotherapy induced)   Grade 3,  Dilaudid PCA (AKI so Dilaudid chosen over Morphine)   - 9/23 started with 0.5 mg PCA dose q 15 min prn, titrate up as needed  - 9/27: Increased bolus to 0.75 mg q95min  -9/28: Decreased back to 0.5 dosing per patient request as sleeping too frequently  -9/30: Decrease to 0.25 for pt request with improvment   -10/4: Stopped PCA, will transition back to oral oxy 10 mg po q 4 hr prn, will also have iv prn dilaudid for severe pain  -10/5: Received oxy this morning with good relief.   -10/8: Resolving, took one tylenol last night and instructed to use oxycodone   -10/11: Decrease oxycodone to 5 mg vs 10 mg   - No current pain, has Oxycodone 5mg  PRN     Psychosocial:??  - Substance abuse (opioid history). Sober for 5 years.  - Followed by Frederik Schmidt in [facility], placed  referral for [facility] while admitted, are not seeing inpatients, will follow- up with him post discharge.   - Insomnia: Continue Melatonin??3mg  and Trazodone 50mg  nightly prn.   - Anxiety: Hospitalization related. Well controlled since discharge.     ** Klonopin 0.5/1mg  daily, at discharge will stop morning dose and keep evening dose.     Bone health:  Dexa scan from 10/13 resulted and shows low bone density. Pharmacy will look into Zometa. We will have him start Calcium and Vit D at his next visit.     ??  Summary: Plan as above D+34    - RTC this coming Friday 10/22 with APP, then Wednesday 10/27.  - Future Appointments listed below.      Zipporah Plants, PA-S  Adult Bone Marrow Transplantation    Future Appointments   Date Time Provider Department Center   03/10/2020  7:30 AM ONCBMT LABS [facility]   03/10/2020  8:00 AM ONCBMT APP A [facility]   03/10/2020  9:30 AM ONCINF CHAIR 06 [facility]   03/15/2020  9:15 AM ONCBMT LABS [facility]   03/15/2020  9:45 AM Artelia Laroche, MD [facility]   03/15/2020 10:30 AM ONCINF CHAIR 02 [facility]   04/11/2020  8:15 AM ONCBMT LABS [facility]   04/11/2020  8:45 AM Artelia Laroche, MD [facility]   04/11/2020 10:00 AM ONCINF CHAIR 03 [facility]   04/26/2020  7:45 AM ONCBMT LABS [facility]   04/26/2020  8:15 AM ONCBMT APP B [facility]   04/26/2020  9:30 AM Bernerd Limbo, FNP [facility]   05/03/2020  9:15 AM ONCBMT LABS [facility]   05/03/2020  9:45 AM Artelia Laroche, MD [facility]

## 2020-03-07 NOTE — Unmapped (Signed)
Called Cassie (Steve's caregiver) to let her and [name] know that Steve's CXR showed continued normal line placement. Also let them know that he needs to start Dapsone for PJP prophylaxis, he has a sulfa allergy so will use Dapsone instead. Advised them that I sent this to the outpatient pharmacy for him to pick up and to take 1 tablet by mouth daily. Cassie verbalized understanding.

## 2020-03-08 LAB — CMV DNA, QUANTITATIVE, PCR

## 2020-03-08 LAB — EBV VIRAL LOAD RESULT: Lab: NOT DETECTED

## 2020-03-08 LAB — CMV QUANT: Lab: 0

## 2020-03-08 NOTE — Unmapped (Signed)
SW Outpatient Note:    Service: BMT Clinic    SW spoke with Pt and Pt's fiance (Cassie) to follow up on previous encounter (see SW note 02/25/20).  Pt's fiance reports that they spoke with a Forensic scientist and were informed that [employer] has been covering some of his bills and as such, claims have not been sent to [employer].  Pt and fiance report that they have not yet followed up directly with [facility] following receipt of letter stating need to submit a claim by 03/19/20 in order to continue access to program/funds (see SW note 02/25/20).  Encouraged Pt and fiance to contact [facility] directly (from information provided in letter) to discuss how they can submit a claim if Pt wants to continue to access this fund.  Pt's fiance reports that they have Pt's insurance premium that they could submit a claim for and will follow up directly with [facility].  SW advised Pt and fiance to contact SW with any updates, and or questions/concerns.       Grace Isaac, LCSW  [facility] Social Work   Phone [phone]  Pager [phone]

## 2020-03-10 ENCOUNTER — Encounter: Admit: 2020-03-10 | Discharge: 2020-03-10 | Payer: PRIVATE HEALTH INSURANCE

## 2020-03-10 ENCOUNTER — Ambulatory Visit
Admit: 2020-03-10 | Discharge: 2020-03-10 | Payer: PRIVATE HEALTH INSURANCE | Attending: Pharmacist Clinician (PhC)/ Clinical Pharmacy Specialist | Primary: Pharmacist Clinician (PhC)/ Clinical Pharmacy Specialist

## 2020-03-10 DIAGNOSIS — C9201 Acute myeloblastic leukemia, in remission: Principal | ICD-10-CM

## 2020-03-10 DIAGNOSIS — Z9484 Stem cells transplant status: Principal | ICD-10-CM

## 2020-03-10 DIAGNOSIS — C9200 Acute myeloblastic leukemia, not having achieved remission: Principal | ICD-10-CM

## 2020-03-10 DIAGNOSIS — M8580 Other specified disorders of bone density and structure, unspecified site: Principal | ICD-10-CM

## 2020-03-10 LAB — CBC W/ AUTO DIFF
BASOPHILS ABSOLUTE COUNT: 0 10*9/L (ref 0.0–0.1)
BASOPHILS RELATIVE PERCENT: 0.5 %
EOSINOPHILS ABSOLUTE COUNT: 0.1 10*9/L (ref 0.0–0.4)
EOSINOPHILS RELATIVE PERCENT: 1.9 %
HEMATOCRIT: 28.1 % — ABNORMAL LOW (ref 41.0–53.0)
HEMOGLOBIN: 9.1 g/dL — ABNORMAL LOW (ref 13.5–17.5)
LARGE UNSTAINED CELLS: 3 % (ref 0–4)
LYMPHOCYTES ABSOLUTE COUNT: 0.7 10*9/L — ABNORMAL LOW (ref 1.5–5.0)
LYMPHOCYTES RELATIVE PERCENT: 14.1 %
MEAN CORPUSCULAR HEMOGLOBIN CONC: 32.4 g/dL (ref 31.0–37.0)
MEAN CORPUSCULAR HEMOGLOBIN: 37.8 pg — ABNORMAL HIGH (ref 26.0–34.0)
MEAN CORPUSCULAR VOLUME: 116.5 fL — ABNORMAL HIGH (ref 80.0–100.0)
MEAN PLATELET VOLUME: 10.2 fL — ABNORMAL HIGH (ref 7.0–10.0)
MONOCYTES ABSOLUTE COUNT: 0.5 10*9/L (ref 0.2–0.8)
MONOCYTES RELATIVE PERCENT: 9.2 %
NEUTROPHILS ABSOLUTE COUNT: 3.5 10*9/L (ref 2.0–7.5)
PLATELET COUNT: 88 10*9/L — ABNORMAL LOW (ref 150–440)
RED CELL DISTRIBUTION WIDTH: 18.6 % — ABNORMAL HIGH (ref 12.0–15.0)
WBC ADJUSTED: 4.9 10*9/L (ref 4.5–11.0)

## 2020-03-10 LAB — COMPREHENSIVE METABOLIC PANEL
ALKALINE PHOSPHATASE: 75 U/L (ref 46–116)
ALT (SGPT): <7 U/L — ABNORMAL LOW (ref 10–49)
ANION GAP: 6 mmol/L (ref 5–14)
AST (SGOT): 12 U/L (ref <=34)
BILIRUBIN TOTAL: 0.3 mg/dL (ref 0.3–1.2)
BLOOD UREA NITROGEN: 21 mg/dL (ref 9–23)
BUN / CREAT RATIO: 17
CALCIUM: 8.7 mg/dL (ref 8.7–10.4)
CO2: 28 mmol/L (ref 20.0–31.0)
CREATININE: 1.24 mg/dL
EGFR CKD-EPI AA MALE: 74 mL/min/{1.73_m2} (ref >=60)
EGFR CKD-EPI NON-AA MALE: 64 mL/min/{1.73_m2} (ref >=60)
GLUCOSE RANDOM: 146 mg/dL (ref 70–179)
POTASSIUM: 4.2 mmol/L (ref 3.4–4.5)
PROTEIN TOTAL: 5.3 g/dL — ABNORMAL LOW (ref 5.7–8.2)
SODIUM: 144 mmol/L (ref 135–145)

## 2020-03-10 LAB — TACROLIMUS, TROUGH: Lab: 6.9

## 2020-03-10 LAB — RED CELL DISTRIBUTION WIDTH: Lab: 18.6 — ABNORMAL HIGH

## 2020-03-10 LAB — ALBUMIN: Albumin:MCnc:Pt:Ser/Plas:Qn:: 2.8 — ABNORMAL LOW

## 2020-03-10 LAB — MAGNESIUM: Magnesium:MCnc:Pt:Ser/Plas:Qn:: 1.5 — ABNORMAL LOW

## 2020-03-10 MED ADMIN — heparin, porcine (PF) 100 unit/mL injection 200 Units: 200 [IU] | INTRAVENOUS | @ 11:00:00 | Stop: 2020-03-10

## 2020-03-10 NOTE — Unmapped (Addendum)
Instructions:   - A BMT pharmacist will call you if I need to make changes to your tacrolimus dose.   - Doing really great.  Continue to work on eating, drinking, and exercising.      Return to clinic on Monday and Wednesday to see one of the providers.     Covid:   Given ongoing novel coronavirus (COVID-19), it is strongly recommended to avoid travel and crowds.  If you develop fever, cough, shortness of breath and/or have known exposure (close contact < 3 feet) with someone who has tested positive, please notify us immediately.    ??? Your best defense is to stay home as much as possible and use good hand hygiene.    For prescription refills:   For refills, please check your medication bottles to see if you have additional refills left. If so, please call your pharmacy and follow the directions to request a refill. If you do not have any refills left, please make a request during your clinic visit or by submitting a request through MyChart or by calling [phone]. Please allow 24 hours if your request is made during the week or 48 hours if requests are made on the weekends or holidays.     Labs:   Lab Results   Component Value Date    WBC 4.9 03/10/2020    HGB 9.1 (L) 03/10/2020    HCT 28.1 (L) 03/10/2020    PLT 88 (L) 03/10/2020     Lab Results   Component Value Date    NA 144 03/10/2020    K 4.2 03/10/2020    CL 110 (H) 03/10/2020    CO2 28.0 03/10/2020    BUN 21 03/10/2020    CREATININE 1.24 03/10/2020    GLU 146 03/10/2020    CALCIUM 8.7 03/10/2020    MG 1.5 (L) 03/10/2020    PHOS 4.1 02/22/2020     Lab Results   Component Value Date    BILITOT 0.3 03/10/2020    BILIDIR 0.30 02/21/2020    PROT 5.3 (L) 03/10/2020    ALBUMIN 2.8 (L) 03/10/2020    ALT <7 (L) 03/10/2020    AST 12 03/10/2020    ALKPHOS 75 03/10/2020    GGT 21 01/26/2020     Lab Results   Component Value Date    INR 1.21 02/22/2020    APTT 33.2 02/17/2020 --------------------------------------------------------------------------------------------------------------------  For appointments & questions Monday through Friday 8 AM-4:30 PM     Please call [phone] or Toll free [phone]    On Nights, Weekends and Holidays  Call [phone] and ask for the oncologist on call    Please visit PrivacyFever[employer]cz, a resource created just for family members and caregivers.  This website lists support services, how and where to ask for help. It has tools to assist you as you help [place] care for your loved one.    [facility]  [address]  [place], [place] [address]  [email or web address]

## 2020-03-10 NOTE — Unmapped (Signed)
Pharmacy - Bone Health Assessment     Patient Name/ID: John Erickson  MRN: [id number]  DOB: Mar 23, 1961    Encounter Date: 03/10/2020    BMT Attending MD: Dr. Oswald Erickson    Patient/Caregiver Phone:  [phone] (home) [phone] (work)      Reason for Visit:  [name] is a 59 y.o. 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 Patient presents to clinic today for bone health assessment in response to routine DEXA scan results     DEXA scan results:  - spine L1 - L4 T score -0.4  - L proximal femur T score -1.2  - L femoral neck T score -1.5    Chemistries  Lab Results   Component Value Date    NA 144 03/10/2020    K 4.2 03/10/2020    CL 110 (H) 03/10/2020    CO2 28.0 03/10/2020    BUN 21 03/10/2020    CREATININE 1.24 03/10/2020    GLU 146 03/10/2020    MG 1.5 (L) 03/10/2020    CALCIUM 8.7 03/10/2020    PHOS 4.1 02/22/2020    AST 12 03/10/2020    ALT <7 (L) 03/10/2020    ALKPHOS 75 03/10/2020       Current Medications:  Current Outpatient Medications   Medication Sig Dispense Refill   ??? cholecalciferol, vitamin D3-25 mcg, 1,000 unit,, (VITAMIN D3) 25 mcg (1,000 unit) capsule Take 25 mcg by mouth daily. (Patient not taking: Reported on 03/03/2020)     ??? clonazePAM (KLONOPIN) 1 MG tablet Take 1 tablet (1 mg total) by mouth nightly. (Patient not taking: Reported on 03/03/2020) 7 tablet 0   ??? dapsone 100 MG tablet Take 1 tablet (100 mg total) by mouth daily. 30 tablet 5   ??? famotidine (PEPCID) 20 MG tablet Take 1 tablet (20 mg total) by mouth Two (2) times a day. (Patient taking differently: Take 20 mg by mouth two (2) times a day as needed. ) 60 tablet 2   ??? fluconazole (DIFLUCAN) 200 MG tablet Take 2 tablets (400 mg total) by mouth daily. 60 tablet 2   ??? letermovir (PREVYMIS) 480 mg tablet Take 1 tablet (480 mg total) by mouth daily. 28 tablet 2   ??? magnesium oxide-Mg AA chelate (MAGNESIUM, AMINO ACID CHELATE,) 133 mg Tab Take 2 tablets by mouth two (2) times a day. 120 tablet 3   ??? melatonin 1 mg Tab tablet Take 2 mg by mouth nightly.     ??? nicotine (NICODERM CQ) 21 mg/24 hr patch Place 1 patch on the skin daily. 28 patch 11   ??? pregabalin (LYRICA) 75 MG capsule Take 1 capsule (75 mg total) by mouth Two (2) times a day. 60 capsule 0   ??? prochlorperazine (COMPAZINE) 10 MG tablet TAKE 1 TABLET (10 MG TOTAL) BY MOUTH EVERY SIX (6) HOURS AS NEEDED FOR NAUSEA. 30 tablet 2   ??? tacrolimus (PROGRAF) 0.5 MG capsule Take 2 capsules (1 mg total) by mouth two (2) times a day. 120 capsule 5   ??? traZODone (DESYREL) 50 MG tablet Take 1 tablet (50 mg total) by mouth nightly as needed for sleep (insomnia). 90 tablet 3   ??? ursodioL (ACTIGALL) 300 mg capsule Take 1 capsule (300 mg total) by mouth Three (3) times a day. 90 capsule 11   ??? valACYclovir (VALTREX) 500 MG tablet Take 1 tablet (500 mg total) by mouth daily. 30 tablet 11     No  current facility-administered medications for this visit.       Assessment / Plan:  - based on DEXA scan, patient has osteopenia of femur and femoral neck Per current protocol, would recommend administering zoledronic acid IV every 3 months x 2 doses in addition to calcium and vitamin D supplementation.  Educated patient about possible side effect of zolendronic acid including: fevers, chills, weakness, muscle and joint aches, stomach upset and kidney insufficiency. Also, counseled patient of rare yet potentially severe side effect of osteonecrosis of the jaw. Patient completed dental clearance prior to transplant and had no mayor dental work done since then.  Last tooth extractions were back in January 2021.   - baseline calcium levels within normal limits. Renal function has been above baseline for the past week and now trending back down. Plan to hold off on zoledronic acid administration until renal function stabilizes. Patient is already on vitamin D 1000 units once daily. Plan to start calcium carbonate 600 mg po twice daily.   - will monitor serum calcium and creatinine levels while receiving zometa        I spent 25 minutes in direct patient care with this patient      John Erickson, PharmD, BCOP  Clinical Pharmacist Practitioner, BMT      Supporting literature for use of zoledronic acid in recipients of allogeneic-SCT includes:    According to a peer-reviewed study published by Gwenith Spitz al., intravenous zoledronic acid significantly increases bone mineral density (BMD) and reduces deoxypyridinoline (Dpd) excretion in patients with bone loss (i.e. osteopenia) after an allogeneic stem cell transplant. Patients in this study were administered zoledronic acid 4mg  IV every 3 months for 2 years. BMD was measured by a DEXA scan (LS lumbar spine, FH femur hip) and Dpd/calcium excretion was assessed by longitudinal measurements. BMD was shown to significantly increase with treatment with zoledronic acid (LS baseline???2 yrs, ? + 11.6 ?? 6.0%, P < 0.001; FH baseline???2 yrs, ? + 7.5 ?? 7.0%, P < 0.001). Dpd decreased from 6.2 ?? 4.6 to 3.6 ?? 3.2 after 100yr (P=0.009) and 3.2 ?? 1.4 after 85yrs (P=0.04).1 Similarly, a study conducted by D???Chales Abrahams et al. demonstrated that zoledronic acid can reduce bone loss in allogeneic stem cell transplant recipients. Total hip BMD and femoral neck BMD was shown to increase by a median of +3.3% (range, 220.4 to +14.8%) and +1.4% (range, 222.2 to +33.6%), respectively.2 Furthermore, Hari et al. performed a multicenter, randomized open-label trial of zoledronic acid to prevent BMD loss in allogenic stem cell transplant recipients with osteopenia prior to transplant. Zoledronic acid demonstrated to significantly increase BMD at the femoral neck (P=0.04) and decrease urine N-telopeptide (P=0.04). The authors concluded that zoledronic acid is a safe and efficacious agent in improving bone health in patients are at risk for osteoporosis after receiving an allogeneic stem cell transplant.3 Taking into consideration this data, [facility] has developed an evidence-based clinical practice protocol to administer intravenous zoledronic acid to allogeneic stem cell transplant recipients who are at risk for osteoporosis.      1. Hausmann A, Hill W, Stemmler HJ, et al. Bone loss after allogeneic haematopoietic stem cell transplantation: a pilot study on the use of zoledronic Acid. Chemother Res Pract. 2012;2012:858590.  2. D???Velna Ochs, Grigg AP, Szer J, [place]. Zoledronic acid prevents bone loss after allogeneic haemopoietic stem cell transplantation. Intern Med J. 2006;36(9):600-603.  3. Hari P, DeFor TE, Vesole DH, Bredeson CN, [place]. Intermittent zoledronic Acid prevents bone loss in adults after allogeneic  hematopoietic cell transplantation. Biol Blood Marrow Transplant. 2013;19(9):1361-1367.

## 2020-03-10 NOTE — Unmapped (Signed)
Bone Marrow Transplant and Cellular Therapy Program  Immunosuppressive Therapy Note    John Erickson is a 59 y.o. 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 John Erickson is currently day +37.    Current dose: 1 mg PO BID (dose decreased on 10/13)    Goal tacrolimus Level: 5-10 ng/mL    Resulted level: 6.9 (drawn appropriately)    Lab Results   Component Value Date/Time    TACROLIMUS 6.9 03/10/2020 07:27 AM    TACROLIMUS 6.0 03/07/2020 07:29 AM    TACROLIMUS 9.6 03/01/2020 10:15 AM     Lab Results   Component Value Date/Time    CREATININE 1.24 03/10/2020 07:27 AM    CREATININE 1.55 (H) 03/07/2020 07:29 AM    CREATININE 1.64 (H) 03/03/2020 08:05 AM     Tacrolimus level is therapeutic. Renal function is improved and downtrending. Plan to continue tacrolimus dose to 1 mg PO twice daily and recheck tacrolimus level and renal function at next clinic visit.     We will continue to monitor levels.  Patient will be followed for changes in renal and hepatic function, toxicity, and efficacy.     Eldridge Abrahams, PharmD  PGY1 Pharmacy Resident    I have reviewed and agree with the resident plan.    Bettey Costa, PharmD, BCPS, BCOP  BMT Clinical Pharmacist Practitioner

## 2020-03-10 NOTE — Unmapped (Signed)
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  Transplant was tolerated relatively well and primarily complicated by mucositis requiring PCA.    Interval History:   [name] is a 59 y.o. male presenting to clinic for f/u with his fiance.     They report he is beginning to feel much better and is sleeping less during the day.  He is now less lightheaded when standing and is wanting to begin exercising.  Appetite is improving and he is eating more.  Nausea and diarrhea are both under control.      No fevers or infectious symptoms.     ROS:  A comprehensive ROS performed and is negative except for pertinent positives as listed above in interval history.     I reviewed and updated past medical, surgical, social, and family history as appropriate.     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,XY[1]    Variants of Known or Likely Clinical Significance  Gene Transcript  Predicted Protein  VAF (%)   CEBPA c.[id number] p.Ser251Argfs 41      Variants of Unknown Significance  Gene Transcript Predicted Protein VAF (%)   TET2 c.4946A>G p.Tyr1649Cys 49      -  FLT-3-ITD and FLT-3-TKD studies are negative    Pertinent Phenotypic data:    Disease-specific prognostic estimate:     Induction:   7+3+HD Dauno (C1D1 2/24)    Recovery Marrow:    Final Diagnosis   Date Value Ref Range Status   08/12/2019   Final    Bone marrow, right iliac, aspiration and biopsy  -  Normocellular bone marrow (60%) with trilineage hematopoiesis and 4% blasts by manual aspirate differential  -  Routine cytogenetic analysis is pending  -  Flow cytometric MRD analysis is pending    This electronic signature is attestation that the pathologist personally reviewed the submitted material(s) and the final diagnosis reflects that evaluation.            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,XY[1]  Variants of Known or Likely Clinical Significance  Gene Transcript  Predicted  Protein  VAF (%)   CEBPA c.753_762del p.Ser251Argfs 41      Variants of Unknown Significance  Gene Transcript Predicted Protein VAF (%)   TET2 c.4946A>G p.Tyr1649Cys 49      -  FLT-3-ITD and FLT-3-TKD studies are negative     07/09/2019 Initial Diagnosis    AML (acute myeloid leukemia) (CMS-HCC)     07/14/2019 - 07/20/2019 Chemotherapy    IP LEUKEMIA 7+3 HIGH DOSE DAUNOrubicin  DAUNOrubicin 90 mg/m2 IV on Days 1, 2, 3  Cytarabine 100 mg/m2 CIVI on Days 1 to 7     07/27/2019 Biopsy    Bone marrow, right iliac, aspiration and biopsy  -  Hypocellular bone marrow (<5%) with treatment effect, markedly reduced trilineage hematopoiesis, and 3% blasts by manual aspirate differential (see Comment).     08/24/2019 - 09/28/2019 Chemotherapy    IP LEUKEMIA HIGH DOSE CYTARABINE CONSOLIDATION (3 G/M2)  cytarabine 3 g/m2 every 12 hours on days 1, 3 and 5 every 28 days     08/24/2019 - 12/26/2019 Chemotherapy    IP LEUKEMIA HIGH DOSE CYTARABINE CONSOLIDATION (3 G/M2) ON DAYS 1,2,3  cytarabine 3 g/m2 IV every 12 hours on days 1, 2, 3, every 28 days     01/19/2020 - 01/19/2020 Chemotherapy    BMT OP BUSULFAN TEST DOSE  Busulfan 0.8 mg/kg IV ONCE     01/26/2020 -  Chemotherapy    BMT IP MAC BUSULFAN / FLUDARABINE / rATG (MUD)   Busulfan IV Days -6 to -3  Fludarabine 40 mg/m2 IV Days -6 to -3  rATG 0.5 mg/kg IV Day -3, 1.5 mg/kg IV Day -2, 2.5 mg/kg IV Day -1       Physical exam:Temp:  [36.8 ??C (98.2 ??F)] 36.8 ??C (98.2 ??F)  Heart Rate:  [74] 74  BP: (109)/(66) 109/66   BP 109/66  - Pulse 74  - Temp 36.8 ??C (98.2 ??F) (Oral)  - Resp 16  - Wt 79.2 kg (174 lb 11.2 oz)  - SpO2 100%  - BMI 27.36 kg/m??   70, Cares for self; unable to carry on normal activity or to do active work (ECOG equivalent 1)    General: No acute distress noted.   Central Venous Access: Line clean, dry, intact. No drainage or induration noted. Skin irritation under bottom corner of gel pad   ENT: Moist mucous membranes. Oropharhynx without lesions, erythema or exudate.   Cardiovascular: Pulse normal rate, regularity and rhythm. S1 and S2 normal, without any murmur, rub, or gallop.  Lungs: Clear to auscultation bilaterally, without wheezes/crackles/rhonchi. Good air movement.   Skin: Warm, dry, intact. Mild erythema/irritation around the central-line dressing outlining previous dressing.  Psychiatry: Alert and oriented to person, place, and time.   Gastrointestinal/Abdomen: Normoactive bowel sounds, abdomen soft, non-tender   Musculoskeletal/Extremities: FROM throughout. Trace bilateral LE edema  Neurologic: CNII-XII intact. Normal strength and sensation throughout.    I reviewed all labs from today in Epic. See EMR for lab results.    Lab Results   Component Value Date    WBC 4.9 03/10/2020    HGB 9.1 (L) 03/10/2020    HCT 28.1 (L) 03/10/2020    PLT 88 (L) 03/10/2020     Lab Results   Component Value Date    NA 144 03/10/2020    K 4.2 03/10/2020    CL 110 (H) 03/10/2020    CO2 28.0 03/10/2020    BUN 21 03/10/2020    CREATININE 1.24 03/10/2020    GLU 146  03/10/2020    CALCIUM 8.7 03/10/2020    MG 1.5 (L) 03/10/2020    PHOS 4.1 02/22/2020     Lab Results   Component Value Date    BILITOT 0.3 03/10/2020    BILIDIR 0.30 02/21/2020    PROT 5.3 (L) 03/10/2020    ALBUMIN 2.8 (L) 03/10/2020    ALT <7 (L) 03/10/2020    AST 12 03/10/2020    ALKPHOS 75 03/10/2020    GGT 21 01/26/2020     Lab Results   Component Value Date    PT 14.1 (H) 02/22/2020    INR 1.21 02/22/2020    APTT 33.2 02/17/2020     Assessment and Plan    BMT:??AML, CR MRD Negative  HCT-CI (age adjusted) 5??(psych, severe pulmonary dysfunction, and age)   Conditioning:??MAC Bu/Flu/ATG  Donor:??9/10, ABO O+, CMV+  Engraftment:??Granix starting D+12 through engraftment (as defined as ANC 1.0 x 2 days or 3.0 x 1 day)  - Date of last granix injection: 02/19/20    GVHD prophylaxis:??  1.Tacrolimus being managed by pharmacy with a goal of 5-10 ng/mL  2. Methotrexate??15 mg/m2 IVP on day +1 then 10 mg/m2 on days +3, +6 and +11  3. ATG per [facility] standard dosing will??be included  ??  Heme:??  Transfusion criteria:??1 unit of PRBCs for Hgb<7 and 1 unit of platelets for Plt <10K or bleeding.   -??No history of transfusion reactions.     CVL:  - Patient has skin irritation, worsened with most recent dressing change.   - CXR confirmed proper line position  - Will try new biopatch and sensitive dressing for next change     ID:??  Prophylaxis:  - Antiviral: Continue Valtrex 500 mg po daily   - Antifungal: Continue Fluconazole 400 mg po daily, continue through day +75  - PJP: Dapsone 100mg  PO daily (has Sulfa allergy).  - CMV D+/R+: Continue Letermovir prophy, 480mg  po daily, continue through D+100  ??  Neutropenic Fever:  02/03/20: Tmax 38.4 Cefepime (02/03/20 - 02/09/20)  - Cultures NGx 5 days  ??  02/13/20: Spiked again at 5am 9/26  - Cultures negative at 5 days  - Started on IV cefepime (9/26- 10/1) and IV flagyl (anaerobic coverage given mucositis) (9/26-10/1). Received a dose of IV vanc but no indication to continue.   ??  Allergy:  - PCN allergy (hives) has tolerated Cefepime without issues  ??  EBV:  - IgM Ab + 01/05/20 but viral load 01/05/20 not detected.   - Viral Load 10/13 not detected.    GI:??  GERD prophylaxis:  - Continue Pepcid BID  ??  Renal:   AKI:   - 02/25/20: SCr up to 1.8, likely 2/2 to ATN from tacrolimus  - 03/10/20: SCr improved to 1.2, likely due to both better po intake and tacrolimus level in range    FEN:   Electrolytes:  - Stopped oral potassium 10/8 due to high level from AKI   - Level now in normal range  - Continue oral magnesium, Level 1.5 today  ??  Supplements: Held on admission  - Pt states he has not been taking: B complex, Vitamin D  ??  Hepatic:??  - No active issues.   - VOD prophylaxis with Actigall TID.     CV:    HTN: New, possibly tac related  - Continue Amlodipine 5mg  daily   - Trace LE edema    Pulm: DLCO 62%  - Former smoker, quit 01/12/20. Using nicotine patch currently.  Has chronic dry cough but PFTs were acceptable.   07/24/19: CT chest w/ 0.6cm RUL nodules, no clear etiology.     ** Discussed with Dr. Oswald Hillock prior to transplant, ok to move forward, no plans to repeat unless new findings.       Neuro/Pain:??  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   - Bilateral palms and feet intermittent paraesthesias: Slowly improving with lyrica.    Mucositis: (chemotherapy induced)   - Required Dilaudid PCA while in the hospital  - No current pain, has Oxycodone 5mg  PRN     Psychosocial:??  - Substance abuse (opioid history). Sober for 5 years.  - Followed by Frederik Schmidt in [facility], placed referral for [facility] while admitted, are not seeing inpatients, will follow- up with him post discharge.   - Insomnia: Continue Melatonin??3mg  and Trazodone 50mg  nightly prn.   - Anxiety: Hospitalization related. Well controlled since discharge.     ** Klonopin 1mg  nightly    Bone health:  Dexa scan from 10/13 resulted and shows low bone density.   - Seen by pharmacy and discussed zometa and calcium supplemenation 10/22  ??  Summary: Plan as above D+37  - RTC Wednesday 10/27    I personally spent 60 minutes face-to-face and non-face-to-face in the care of this patient, which includes all pre, intra, and post visit time on the date of service.    Dameer Speiser Elie Confer, [facility]  Physician Assistant  Adult Bone Marrow Transplantation    Future Appointments   Date Time Provider Department Center   03/10/2020  9:00 AM Rulon Abide, PharmD CPP [facility]   03/10/2020  9:30 AM ONCINF CHAIR 06 [id number]   03/13/2020  7:15 AM ONCBMT LABS [facility]   03/13/2020  8:00 AM ONCBMT APP A [facility]   03/13/2020  9:00 AM ONCINF CHAIR 01 [id number]   03/15/2020  9:15 AM ONCBMT LABS [facility]   03/15/2020  9:45 AM Artelia Laroche, MD [facility]   03/15/2020 10:30 AM ONCINF CHAIR 02 [id number]   03/17/2020  8:45 AM ONCBMT LABS [facility]   03/17/2020  9:15 AM ONCBMT APP B [facility]   03/17/2020 10:30 AM ONCINF CHAIR 05 [id number]   04/11/2020  8:15 AM ONCBMT LABS [facility]   04/11/2020  8:45 AM Artelia Laroche, MD [facility]   04/11/2020 10:00 AM ONCINF CHAIR 03 [id number]   04/26/2020  7:45 AM ONCBMT LABS [facility]   04/26/2020  8:15 AM ONCBMT APP B [facility]   04/26/2020  9:30 AM Bernerd Limbo, FNP [id number]   05/03/2020  9:15 AM ONCBMT LABS [facility]   05/03/2020  9:45 AM Artelia Laroche, MD [facility]

## 2020-03-13 ENCOUNTER — Encounter: Admit: 2020-03-13 | Discharge: 2020-03-13 | Payer: PRIVATE HEALTH INSURANCE

## 2020-03-13 DIAGNOSIS — Z9484 Stem cells transplant status: Principal | ICD-10-CM

## 2020-03-13 DIAGNOSIS — C9201 Acute myeloblastic leukemia, in remission: Principal | ICD-10-CM

## 2020-03-13 LAB — COMPREHENSIVE METABOLIC PANEL
ALBUMIN: 2.8 g/dL — ABNORMAL LOW (ref 3.4–5.0)
ALKALINE PHOSPHATASE: 72 U/L (ref 46–116)
ALT (SGPT): <7 U/L — ABNORMAL LOW (ref 10–49)
ANION GAP: 6 mmol/L (ref 5–14)
AST (SGOT): 16 U/L (ref <=34)
BILIRUBIN TOTAL: 0.3 mg/dL (ref 0.3–1.2)
BLOOD UREA NITROGEN: 14 mg/dL (ref 9–23)
BUN / CREAT RATIO: 14
CHLORIDE: 110 mmol/L — ABNORMAL HIGH (ref 98–107)
CO2: 26 mmol/L (ref 20.0–31.0)
CREATININE: 0.99 mg/dL
EGFR CKD-EPI NON-AA MALE: 84 mL/min/{1.73_m2} (ref >=60)
GLUCOSE RANDOM: 120 mg/dL (ref 70–179)
PROTEIN TOTAL: 5.1 g/dL — ABNORMAL LOW (ref 5.7–8.2)
SODIUM: 142 mmol/L (ref 135–145)

## 2020-03-13 LAB — CBC W/ AUTO DIFF
BASOPHILS ABSOLUTE COUNT: 0 10*9/L (ref 0.0–0.1)
BASOPHILS RELATIVE PERCENT: 0.4 %
EOSINOPHILS ABSOLUTE COUNT: 0.1 10*9/L (ref 0.0–0.4)
EOSINOPHILS RELATIVE PERCENT: 2.7 %
HEMATOCRIT: 27.3 % — ABNORMAL LOW (ref 41.0–53.0)
HEMOGLOBIN: 9.1 g/dL — ABNORMAL LOW (ref 13.5–17.5)
LARGE UNSTAINED CELLS: 4 % (ref 0–4)
LYMPHOCYTES ABSOLUTE COUNT: 0.7 10*9/L — ABNORMAL LOW (ref 1.5–5.0)
LYMPHOCYTES RELATIVE PERCENT: 14.7 %
MEAN CORPUSCULAR HEMOGLOBIN CONC: 33.4 g/dL (ref 31.0–37.0)
MEAN CORPUSCULAR HEMOGLOBIN: 39.2 pg — ABNORMAL HIGH (ref 26.0–34.0)
MEAN CORPUSCULAR VOLUME: 117.2 fL — ABNORMAL HIGH (ref 80.0–100.0)
MONOCYTES ABSOLUTE COUNT: 0.4 10*9/L (ref 0.2–0.8)
MONOCYTES RELATIVE PERCENT: 8.2 %
NEUTROPHILS ABSOLUTE COUNT: 3.2 10*9/L (ref 2.0–7.5)
NEUTROPHILS RELATIVE PERCENT: 70.5 %
PLATELET COUNT: 104 10*9/L — ABNORMAL LOW (ref 150–440)
RED BLOOD CELL COUNT: 2.32 10*12/L — ABNORMAL LOW (ref 4.50–5.90)
RED CELL DISTRIBUTION WIDTH: 19 % — ABNORMAL HIGH (ref 12.0–15.0)
WBC ADJUSTED: 4.5 10*9/L (ref 4.5–11.0)

## 2020-03-13 LAB — EOSINOPHILS ABSOLUTE COUNT: Eosinophils:NCnc:Pt:Bld:Qn:Automated count: 0.1

## 2020-03-13 LAB — CMV QUANT LOG10: Lab: 0

## 2020-03-13 LAB — SMEAR REVIEW

## 2020-03-13 LAB — TACROLIMUS, TROUGH: Lab: 7.2

## 2020-03-13 LAB — CMV DNA, QUANTITATIVE, PCR: CMV VIRAL LD: NOT DETECTED

## 2020-03-13 LAB — BUN / CREAT RATIO: Urea nitrogen/Creatinine:MRto:Pt:Ser/Plas:Qn:: 14

## 2020-03-13 LAB — MAGNESIUM: Magnesium:MCnc:Pt:Ser/Plas:Qn:: 1.5 — ABNORMAL LOW

## 2020-03-13 MED ADMIN — heparin, porcine (PF) 100 unit/mL injection 500 Units: 500 [IU] | INTRAVENOUS | @ 11:00:00 | Stop: 2020-03-13

## 2020-03-13 NOTE — Unmapped (Signed)
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  Transplant was tolerated relatively well and primarily complicated by mucositis requiring PCA.    Interval History: [name] is a 59 y.o. male presenting to clinic for f/u with his fiance. He continue to improve. He has more energy; back to walking 1 mile per day and lifting light weights. He denies any sob while exercising and thinks he is back to walking at his pre-transplant pace. He is napping less, sleeping ok. His appetite is improved along with his taste - he is eating about double what he was the previous week. He is drinking well - at least 4 bottles of flavored water a day if not more. He has no nausea but will take an evening anti-emetic when he has a lot of pills to take. Lots of gas [normal for him] but no diarrhea. He confirms that he is not smoking and doing well with this. Dry, non-productive smokers cough.     He has a lot of skin changes post chemotherapy with areas of hyper/ hypopigmentation. Was tender on the soles of his feet but that has resolved. Has a 1cm x .2 long fluid-filled lesion on his right antecubital area that is not itchy, not tender, no other lesions noted. He does not remember coming into contact with any outside trees/ weeds, no new medications. Does not itch, no peeling noted. He also has an area of dried blood noted under his CVAD dressing from skin irritation. Due to be changed today. She was given biopatches and sensitive dressings to try.     Lower leg/ ankle swelling in both legs. Even on both sides. No pain with walking. Worse after lots of activity.     ROS:  A comprehensive ROS performed and is negative except for pertinent positives as listed above in interval history.     I reviewed and updated past medical, surgical, social, and family history as appropriate. Living in an apartment in [place] with his fiance.     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,XY[1]    Variants of Known or Likely Clinical Significance  Gene Transcript  Predicted Protein  VAF (%)   CEBPA [id number] p.Ser251Argfs 41      Variants of Unknown Significance  Gene Transcript Predicted Protein VAF (%)   TET2 c.4946A>G p.Tyr1649Cys 49      -  FLT-3-ITD and FLT-3-TKD studies are negative    Pertinent Phenotypic data:    Disease-specific prognostic estimate:     Induction:   7+3+HD Dauno (C1D1 2/24)    Recovery Marrow:    Final Diagnosis   Date Value Ref Range Status   08/12/2019   Final    Bone marrow, right iliac, aspiration and biopsy  -  Normocellular bone  marrow (60%) with trilineage hematopoiesis and 4% blasts by manual aspirate differential  -  Routine cytogenetic analysis is pending  -  Flow cytometric MRD analysis is pending    This electronic signature is attestation that the pathologist personally reviewed the submitted material(s) and the final diagnosis reflects that evaluation.            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,XY[1]  Variants of Known or Likely Clinical Significance  Gene Transcript  Predicted Protein  VAF (%)   CEBPA c.753_762del p.Ser251Argfs 41      Variants of Unknown Significance  Gene Transcript Predicted Protein VAF (%)   TET2 c.4946A>G p.Tyr1649Cys 49      -  FLT-3-ITD and FLT-3-TKD studies are negative     07/09/2019 Initial Diagnosis    AML (acute myeloid leukemia) (CMS-HCC)     07/14/2019 - 07/20/2019 Chemotherapy    IP LEUKEMIA 7+3 HIGH DOSE DAUNOrubicin  DAUNOrubicin 90 mg/m2 IV on Days 1, 2, 3  Cytarabine 100 mg/m2 CIVI on Days 1 to 7     07/27/2019 Biopsy    Bone marrow, right iliac, aspiration and biopsy  -  Hypocellular bone marrow (<5%) with treatment effect, markedly reduced trilineage hematopoiesis, and 3% blasts by manual aspirate differential (see Comment).     08/24/2019 - 09/28/2019 Chemotherapy    IP LEUKEMIA HIGH DOSE CYTARABINE CONSOLIDATION (3 G/M2)  cytarabine 3 g/m2 every 12 hours on days 1, 3 and 5 every 28 days     08/24/2019 - 12/26/2019 Chemotherapy    IP LEUKEMIA HIGH DOSE CYTARABINE CONSOLIDATION (3 G/M2) ON DAYS 1,2,3  cytarabine 3 g/m2 IV every 12 hours on days 1, 2, 3, every 28 days     01/19/2020 - 01/19/2020 Chemotherapy    BMT OP BUSULFAN TEST DOSE  Busulfan 0.8 mg/kg IV ONCE     01/26/2020 -  Chemotherapy    BMT IP MAC BUSULFAN / FLUDARABINE / rATG (MUD)   Busulfan IV Days -6 to -3  Fludarabine 40 mg/m2 IV Days -6 to -3  rATG 0.5 mg/kg IV Day -3, 1.5 mg/kg IV Day -2, 2.5 mg/kg IV Day -1       Objective:  Vitals:    03/13/20 0742   BP: 112/83   Pulse: 77   Resp: 16   Temp: 36.3 ??C (97.4 ??F)   TempSrc: Temporal   SpO2: 100%   Weight: 80.4 kg (177 lb 3.2 oz)   Height: 170.2 cm (5' 7.01)     General: No distress noted.   CVAD: Line clean, dry, intact. Some skin tearing right bottom corner with dried blood. Non-tender.   ENT: MMM. No oral lesions or exudate.   Cardiovascular: RRR, no murmur, 1+ non-pitting edema bilaterally.   Lungs: Clear to auscultation bilaterally, without wheezes/crackles/rhonchi. Good air movement.   Skin: Warm, dry, intact.  Mild erythema/irritation around the central-line dressing outlining previous dressing.  Psychiatry: Alert and oriented to person, place, and time.   Gastrointestinal/Abdomen: Normoactive bowel sounds, abdomen soft, non-tender   Musculoskeletal/Extremities: FROM throughout. Trace bilateral LE edema  Neurologic: CNII-XII intact. Normal strength and sensation throughout.    I reviewed all labs from today in Epic. See EMR for lab results.    Lab Results   Component Value Date    WBC 4.5 03/13/2020    HGB 9.1 (L) 03/13/2020    HCT 27.3 (L) 03/13/2020    PLT 104 (L) 03/13/2020     Lab Results   Component Value Date    NA 142 03/13/2020    K 3.9 03/13/2020    CL 110 (H) 03/13/2020    CO2 26.0 03/13/2020    BUN 14 03/13/2020    CREATININE 0.99 03/13/2020    GLU 120 03/13/2020    CALCIUM 8.4 (L) 03/13/2020    MG 1.5 (L) 03/13/2020    PHOS 4.1 02/22/2020     Lab Results   Component Value Date    BILITOT 0.3 03/13/2020    BILIDIR 0.30 02/21/2020    PROT 5.1 (L) 03/13/2020    ALBUMIN 2.8 (L) 03/13/2020    ALT <7 (L) 03/13/2020    AST 16 03/13/2020    ALKPHOS 72 03/13/2020    GGT 21 01/26/2020     Lab Results   Component Value Date    Tacrolimus, Trough 6.9 03/10/2020    Tacrolimus, Trough 6.0 03/07/2020     Assessment and Plan    BMT:??AML, CR MRD Negative  HCT-CI (age adjusted) 5??(psych, severe pulmonary dysfunction, and age)   Conditioning:??MAC Bu/Flu/ATG  Donor:??9/10, ABO O+, CMV+  Engraftment:??Granix starting D+12 through engraftment (as defined as ANC 1.0 x 2 days or 3.0 x 1 day)  - Date of last granix injection: 02/19/20    GvHD prophylaxis:??  - Methotrexate, ATG  - Tacrolimus being managed by pharmacy with a goal of 5-10 ng/mL??    ** Last trough 6.9; current dose 1mg  BID    Heme:??  Transfusion criteria:??1 unit PRBCs for Hgb<7 and 1 unit platelets for Plt <10K or bleeding.   -??No history of transfusion reactions.     CVL:  - Dressing irritation.    ** Now given biopatches and sensitive dressings.     ID:??  Prophylaxis:  - Antiviral: Valtrex 500 mg po daily   - Antifungal: Fluconazole 400 mg po daily, continue through D+75  - PJP: Dapsone 100mg  PO daily (Sulfa allergy).  - CMV D+/R+: Letermovir prophy, 480mg  po daily, through D+100  ??  Neutropenic Fever:  02/03/20: Tmax 38.4 Cefepime (02/03/20 - 02/09/20)  - Cultures negative   ????  Allergies:  - PCN allergy (hives) has tolerated Cefepime without issues.  - Bactrim.   ??  CMV/ EBV:  - EBV IgM Ab + [01/05/20] but viral load undetected.   03/08/19: CMV/ EBV negative.     ** Pending from today.     GI:??  GERD prophylaxis:  - Pepcid BID.     ** Would like to start tapering off this week.   ??  Renal:   AKI: Resolved.     FEN:   Electrolytes:  - On oral Magnesium.   ????  Hepatic:??  - No active issues.   - VOD prophylaxis with Actigall TID.     CV:    HTN: New, possibly tac related  - Continue Amlodipine 5mg  daily  Pulm: DLCO 62%  - Former smoker, quit 01/12/20. Using nicotine patch currently. Chronic dry cough but PFTs were acceptable.   07/24/19: CT chest w/ 0.6cm RUL nodules, no clear etiology.     ** No plans to repeat unless new findings.        Neuro/Pain:??  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   - Bilateral palms and feet intermittent paraesthesias: Resolved.     Mucositis: (chemotherapy induced) Resolved   - Required Dilaudid PCA while inpatient.   - Oxycodone 5mg  PRN     Psychosocial:??  - Substance abuse (opioid history). Sober for 5 years.  - Followed by Frederik Schmidt in [facility], placed referral for [facility] while admitted, are not seeing inpatients, will follow- up with him post discharge.   - Insomnia: Continue Melatonin??3mg  and Trazodone 50mg  nightly prn [taking nightly].   - Anxiety: Hospitalization related. Well controlled since discharge.     ** Klonopin 1mg  nightly    Bone health:  - Based on DEXA scan, has osteopenia of femur and femoral neck.  - Per current protocol, would recommend administering zoledronic acid IV every 3 months x 2 doses in addition to calcium and vitamin D supplementation.    ** AKI resolved - will schedule 1st zometa infusion.    ** Will begin Calcium this week.   ??  Summary: Plan as above. Will request to schedule 1st dose of zometa now that AKI has resolved. Pharmacy to follow-up tac trough today and adjust prn. CMV/ EBV pending. Otherwise, plan as above.     I personally spent 50 minutes face-to-face and non-face-to-face in the care of this patient, which includes all pre, intra, and post visit time on the date of service.    Elica Almas A. Marisa Hua, FNP-BC  Nurse Practitioner - Adult BMT    Future Appointments   Date Time Provider Department Center   03/15/2020  9:15 AM ONCBMT LABS [facility]   03/15/2020  9:45 AM Artelia Laroche, MD [facility]   03/15/2020 10:30 AM ONCINF CHAIR 02 [id number]   03/17/2020  8:45 AM ONCBMT LABS [facility]   03/17/2020  9:15 AM ONCBMT APP B [facility]   03/17/2020 10:30 AM ONCINF CHAIR 05 [id number]   04/11/2020  8:15 AM ONCBMT LABS [facility]   04/11/2020  8:45 AM Artelia Laroche, MD [facility]   04/11/2020 10:00 AM ONCINF CHAIR 03 [id number]   04/26/2020  7:45 AM ONCBMT LABS [facility]   04/26/2020  8:15 AM ONCBMT APP B [facility]   04/26/2020  9:30 AM Bernerd Limbo, FNP [id number]   05/03/2020  9:15 AM ONCBMT LABS [facility]   05/03/2020  9:45 AM Artelia Laroche, MD [facility]

## 2020-03-13 NOTE — Unmapped (Signed)
Bone Marrow Transplant and Cellular Therapy Program  Immunosuppressive Therapy Note    [name] is a 59 y.o. 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 [name] is currently day +40.    Current dose: 1 mg PO BID (dose decreased on 10/13)    Goal tacrolimus Level: 5-10 ng/mL    Resulted level: 7.2 (drawn appropriately)    Lab Results   Component Value Date/Time    TACROLIMUS 7.2 03/13/2020 07:24 AM    TACROLIMUS 6.9 03/10/2020 07:27 AM    TACROLIMUS 6.0 03/07/2020 07:29 AM     Lab Results   Component Value Date/Time    CREATININE 0.99 03/13/2020 07:24 AM    CREATININE 1.24 03/10/2020 07:27 AM    CREATININE 1.55 (H) 03/07/2020 07:29 AM     Tacrolimus level is therapeutic. Renal function is improved and downtrending. LFTs remain WNL.  Plan to continue tacrolimus dose to 1 mg PO twice daily and recheck tacrolimus level and renal function at next clinic visit.     We will continue to monitor levels.  Patient will be followed for changes in renal and hepatic function, toxicity, and efficacy.     Bettey Costa, PharmD, BCPS, BCOP  BMT Clinical Pharmacist Practitioner

## 2020-03-13 NOTE — Unmapped (Signed)
Bone Marrow Transplant and Cellular Therapy Program  Immunosuppressive Therapy Note    [name] is a 59 y.o. 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 [name] is currently day +40.    Current dose: 1 mg PO BID (dose decreased on 10/13)    Goal tacrolimus Level: 5-10 ng/mL    Resulted level: 7.2 (drawn appropriately)    Lab Results   Component Value Date/Time    TACROLIMUS 7.2 03/13/2020 07:24 AM    TACROLIMUS 6.9 03/10/2020 07:27 AM    TACROLIMUS 6.0 03/07/2020 07:29 AM     Lab Results   Component Value Date/Time    CREATININE 0.99 03/13/2020 07:24 AM    CREATININE 1.24 03/10/2020 07:27 AM    CREATININE 1.55 (H) 03/07/2020 07:29 AM     Tacrolimus level is therapeutic. Renal function continues improving and is almost back to baseline. Plan to continue tacrolimus dose to 1 mg PO twice daily and recheck tacrolimus level and renal function at next clinic visit.     We will continue to monitor levels.  Patient will be followed for changes in renal and hepatic function, toxicity, and efficacy.     Rulon Abide, PharmD, BCOP  Clinical Pharmacist Practitioner, BMT

## 2020-03-15 ENCOUNTER — Encounter: Admit: 2020-03-15 | Discharge: 2020-03-16 | Payer: PRIVATE HEALTH INSURANCE

## 2020-03-15 DIAGNOSIS — Z9484 Stem cells transplant status: Principal | ICD-10-CM

## 2020-03-15 DIAGNOSIS — C9201 Acute myeloblastic leukemia, in remission: Principal | ICD-10-CM

## 2020-03-15 LAB — COMPREHENSIVE METABOLIC PANEL
ALBUMIN: 3.2 g/dL — ABNORMAL LOW (ref 3.4–5.0)
ALKALINE PHOSPHATASE: 75 U/L (ref 46–116)
ALT (SGPT): 8 U/L — ABNORMAL LOW (ref 10–49)
ANION GAP: 4 mmol/L — ABNORMAL LOW (ref 5–14)
AST (SGOT): 17 U/L (ref <=34)
BILIRUBIN TOTAL: 0.4 mg/dL (ref 0.3–1.2)
BLOOD UREA NITROGEN: 10 mg/dL (ref 9–23)
BUN / CREAT RATIO: 9
CALCIUM: 9.1 mg/dL (ref 8.7–10.4)
CHLORIDE: 110 mmol/L — ABNORMAL HIGH (ref 98–107)
CO2: 27 mmol/L (ref 20.0–31.0)
CREATININE: 1.06 mg/dL
EGFR CKD-EPI AA MALE: 90 mL/min/{1.73_m2} (ref >=60)
EGFR CKD-EPI NON-AA MALE: 78 mL/min/{1.73_m2} (ref >=60)
GLUCOSE RANDOM: 107 mg/dL (ref 70–179)
POTASSIUM: 4 mmol/L (ref 3.4–4.5)
PROTEIN TOTAL: 5.6 g/dL — ABNORMAL LOW (ref 5.7–8.2)

## 2020-03-15 LAB — CBC W/ AUTO DIFF
BASOPHILS ABSOLUTE COUNT: 0 10*9/L (ref 0.0–0.1)
BASOPHILS RELATIVE PERCENT: 0.4 %
EOSINOPHILS ABSOLUTE COUNT: 0.2 10*9/L (ref 0.0–0.4)
EOSINOPHILS RELATIVE PERCENT: 2.4 %
HEMATOCRIT: 31.1 % — ABNORMAL LOW (ref 41.0–53.0)
LARGE UNSTAINED CELLS: 4 % (ref 0–4)
LYMPHOCYTES ABSOLUTE COUNT: 0.8 10*9/L — ABNORMAL LOW (ref 1.5–5.0)
LYMPHOCYTES RELATIVE PERCENT: 12 %
MEAN CORPUSCULAR HEMOGLOBIN CONC: 33.1 g/dL (ref 31.0–37.0)
MEAN CORPUSCULAR HEMOGLOBIN: 38.3 pg — ABNORMAL HIGH (ref 26.0–34.0)
MEAN CORPUSCULAR VOLUME: 115.9 fL — ABNORMAL HIGH (ref 80.0–100.0)
MEAN PLATELET VOLUME: 10.4 fL — ABNORMAL HIGH (ref 7.0–10.0)
MONOCYTES ABSOLUTE COUNT: 0.5 10*9/L (ref 0.2–0.8)
MONOCYTES RELATIVE PERCENT: 7.2 %
NEUTROPHILS ABSOLUTE COUNT: 5 10*9/L (ref 2.0–7.5)
NEUTROPHILS RELATIVE PERCENT: 74.4 %
PLATELET COUNT: 133 10*9/L — ABNORMAL LOW (ref 150–440)
RED BLOOD CELL COUNT: 2.69 10*12/L — ABNORMAL LOW (ref 4.50–5.90)
RED CELL DISTRIBUTION WIDTH: 18.9 % — ABNORMAL HIGH (ref 12.0–15.0)
WBC ADJUSTED: 6.7 10*9/L (ref 4.5–11.0)

## 2020-03-15 LAB — ALT (SGPT): Alanine aminotransferase:CCnc:Pt:Ser/Plas:Qn:: 8 — ABNORMAL LOW

## 2020-03-15 LAB — MAGNESIUM: Magnesium:MCnc:Pt:Ser/Plas:Qn:: 1.5 — ABNORMAL LOW

## 2020-03-15 LAB — TACROLIMUS, TROUGH: Lab: 7.1

## 2020-03-15 LAB — MONOCYTES RELATIVE PERCENT: Monocytes/100 leukocytes:NFr:Pt:Bld:Qn:Automated count: 7.2

## 2020-03-15 MED ORDER — MG-PLUS-PROTEIN 133 MG TABLET
ORAL_TABLET | Freq: Two times a day (BID) | ORAL | 3 refills | 20 days | Status: CP
Start: 2020-03-15 — End: ?

## 2020-03-15 MED ADMIN — heparin, porcine (PF) 100 unit/mL injection 200 Units: 200 [IU] | INTRAVENOUS | @ 13:00:00 | Stop: 2020-03-15

## 2020-03-15 NOTE — Unmapped (Signed)
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  Transplant was tolerated relatively well and primarily complicated by mucositis requiring PCA.    Interval History:   [name] is a 59 y.o. male presenting to clinic for f/u with his sister John Erickson.      He is day+42 from his tranplant. Since the last visit, patient reports he has been doing better. He reports that every week he feels like his symptoms are getting better. He noticed 2-3 blisters on his Rt arm, and they spontaneously healed. No new rash. His appetite is gradually improving, he is still trying to figure out what he can eat best. He has been trying to stay active.     ROS:  A comprehensive ROS performed and is negative except for pertinent positives as listed above in interval history.     I reviewed and updated past medical, surgical, social, and family history as appropriate.     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,XY[1]    Variants of Known or Likely Clinical Significance  Gene Transcript  Predicted Protein  VAF (%)   CEBPA c.753_762del p.Ser251Argfs 41      Variants of Unknown Significance  Gene Transcript Predicted Protein VAF (%)   TET2 c.4946A>G p.Tyr1649Cys 49      -  FLT-3-ITD and FLT-3-TKD studies are negative    Pertinent Phenotypic data:    Disease-specific prognostic estimate: Induction:   7+3+HD Dauno (C1D1 2/24)    Recovery Marrow:    Final Diagnosis   Date Value Ref Range Status   08/12/2019   Final    Bone marrow, right iliac, aspiration and biopsy  -  Normocellular bone marrow (60%) with trilineage hematopoiesis and 4% blasts by manual aspirate differential  -  Routine cytogenetic analysis is pending  -  Flow cytometric MRD analysis is pending    This electronic signature is attestation that the pathologist personally reviewed the submitted material(s) and the final diagnosis reflects that evaluation.            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,XY[1]  Variants of  Known or Likely Clinical Significance  Gene Transcript  Predicted Protein  VAF (%)   CEBPA c.753_762del p.Ser251Argfs 41      Variants of Unknown Significance  Gene Transcript Predicted Protein VAF (%)   TET2 c.4946A>G p.Tyr1649Cys 49      -  FLT-3-ITD and FLT-3-TKD studies are negative     07/09/2019 Initial Diagnosis    AML (acute myeloid leukemia) (CMS-HCC)     07/14/2019 - 07/20/2019 Chemotherapy    IP LEUKEMIA 7+3 HIGH DOSE DAUNOrubicin  DAUNOrubicin 90 mg/m2 IV on Days 1, 2, 3  Cytarabine 100 mg/m2 CIVI on Days 1 to 7     07/27/2019 Biopsy    Bone marrow, right iliac, aspiration and biopsy  -  Hypocellular bone marrow (<5%) with treatment effect, markedly reduced trilineage hematopoiesis, and 3% blasts by manual aspirate differential (see Comment).     08/24/2019 - 09/28/2019 Chemotherapy    IP LEUKEMIA HIGH DOSE CYTARABINE CONSOLIDATION (3 G/M2)  cytarabine 3 g/m2 every 12 hours on days 1, 3 and 5 every 28 days     08/24/2019 - 12/26/2019 Chemotherapy    IP LEUKEMIA HIGH DOSE CYTARABINE CONSOLIDATION (3 G/M2) ON DAYS 1,2,3  cytarabine 3 g/m2 IV every 12 hours on days 1, 2, 3, every 28 days     01/19/2020 - 01/19/2020 Chemotherapy    BMT OP BUSULFAN TEST DOSE  Busulfan 0.8 mg/kg IV ONCE     01/26/2020 -  Chemotherapy    BMT IP MAC BUSULFAN / FLUDARABINE / rATG (MUD)   Busulfan IV Days -6 to -3  Fludarabine 40 mg/m2 IV Days -6 to -3  rATG 0.5 mg/kg IV Day -3, 1.5 mg/kg IV Day -2, 2.5 mg/kg IV Day -1       Physical exam:Temp:  [36.9 ??C] 36.9 ??C  Heart Rate:  [65] 65  BP: (134)/(79) 134/79   BP 134/79  - Pulse 65  - Temp 36.9 ??C (Oral)  - Resp 16  - Wt 77.2 kg (170 lb 3.1 oz)  - SpO2 99%  - BMI 26.65 kg/m??   70, Cares for self; unable to carry on normal activity or to do active work (ECOG equivalent 1)    General: No acute distress noted.   Central Venous Access: Line clean, dry, intact. No drainage or induration noted. Skin irritation under bottom corner of gel pad   ENT: Moist mucous membranes. Oropharhynx without lesions, erythema or exudate.   Cardiovascular: Pulse normal rate, regularity and rhythm. S1 and S2 normal, without any murmur, rub, or gallop.  Lungs: Clear to auscultation bilaterally, without wheezes/crackles/rhonchi. Good air movement.   Skin: Warm, dry, intact. Mild erythema/irritation around the central-line dressing outlining previous dressing.  Psychiatry: Alert and oriented to person, place, and time.   Gastrointestinal/Abdomen: Normoactive bowel sounds, abdomen soft, non-tender   Musculoskeletal/Extremities: FROM throughout. Trace bilateral LE edema  Neurologic: CNII-XII intact. Normal strength and sensation throughout.    I reviewed all labs from today in Epic. See EMR for lab results.    Lab Results   Component Value Date    WBC 6.7 03/15/2020    HGB 10.3 (L) 03/15/2020    HCT 31.1 (L) 03/15/2020    PLT 133 (L) 03/15/2020     Lab Results   Component Value Date    NA 141 03/15/2020    K 4.0 03/15/2020    CL 110 (H) 03/15/2020    CO2 27.0 03/15/2020    BUN 10 03/15/2020    CREATININE 1.06 03/15/2020  GLU 107 03/15/2020    CALCIUM 9.1 03/15/2020    MG 1.5 (L) 03/15/2020    PHOS 4.1 02/22/2020     Lab Results   Component Value Date    BILITOT 0.4 03/15/2020    BILIDIR 0.30 02/21/2020    PROT 5.6 (L) 03/15/2020    ALBUMIN 3.2 (L) 03/15/2020    ALT 8 (L) 03/15/2020    AST 17 03/15/2020    ALKPHOS 75 03/15/2020    GGT 21 01/26/2020     Lab Results   Component Value Date    PT 14.1 (H) 02/22/2020    INR 1.21 02/22/2020    APTT 33.2 02/17/2020     Assessment and Plan    BMT: AML, CR MRD Negative  HCT-CI (age adjusted) 5 (psych, severe pulmonary dysfunction, and age)   Conditioning: MAC Bu/Flu/ATG  Donor: 9/10, ABO O+, CMV+  Engraftment: Granix starting D+12 through engraftment (as defined as ANC 1.0 x 2 days or 3.0 x 1 day)  - Date of last granix injection: 02/19/20    GVHD prophylaxis:   1.Tacrolimus being managed by pharmacy with a goal of 5-10 ng/mL  2. Methotrexate 15 mg/m2 IVP on day +1 then 10 mg/m2 on days +3, +6 and +11  3. ATG per [facility] standard dosing will be included     Heme:   Transfusion criteria: 1 unit of PRBCs for Hgb<7 and 1 unit of platelets for Plt <10K or bleeding.   - No history of transfusion reactions.     CVL:  - Patient has skin irritation, worsened with most recent dressing change.   - CXR confirmed proper line position  - Will try new biopatch and sensitive dressing for next change     ID:   Prophylaxis:  - Antiviral: Continue Valtrex 500 mg po daily   - Antifungal: Continue Fluconazole 400 mg po daily, continue through day +75  - PJP: Dapsone 100mg  PO daily (has Sulfa allergy).  - CMV D+/R+: Continue Letermovir prophy, 480mg  po daily, continue through D+100     Neutropenic Fever:  02/03/20: Tmax 38.4 Cefepime (02/03/20 - 02/09/20)  - Cultures NGx 5 days     02/13/20: Spiked again at 5am 9/26  - Cultures negative at 5 days  - Started on IV cefepime (9/26- 10/1) and IV flagyl (anaerobic coverage given mucositis) (9/26-10/1). Received a dose of IV vanc but no indication to continue.      Allergy:  - PCN allergy (hives) has tolerated Cefepime without issues     EBV:  - IgM Ab + 01/05/20 but viral load 01/05/20 not detected.   - Viral Load 10/13 not detected.    GI:   GERD prophylaxis:  - Continue Pepcid BID     Renal:   AKI:   - 02/25/20: SCr up to 1.8, likely 2/2 to ATN from tacrolimus  - 03/10/20: SCr improved to 1.2, likely due to both better po intake and tacrolimus level in range  -03/15/20: S Cr at 1.00, have encouraged increase PO fluid intake    FEN:   Electrolytes:  - Stopped oral potassium 10/8 due to high level from AKI   - Level now in normal range  - Continue oral magnesium, Level 1.5 today  -Mag low at 1.5 increased PO mag to 3 tab BID      Supplements: Held on admission  - Pt states he has not been taking: B complex, Vitamin D     Hepatic:   - No active issues.   -  VOD prophylaxis with Actigall TID.     CV:    HTN: New, possibly tac related  - Continue Amlodipine 5mg  daily   - Trace LE edema    Pulm: DLCO 62%  - Former smoker, quit 01/12/20. Using nicotine patch currently. Has chronic dry cough but PFTs were acceptable.   07/24/19: CT chest w/ 0.6cm RUL nodules, no clear etiology.     ** Discussed with Dr. Oswald Hillock prior to transplant, ok to move forward, no plans to repeat unless new findings.       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   - Bilateral palms and feet intermittent paraesthesias: Slowly improving with lyrica.    Mucositis: (chemotherapy induced)   - Required Dilaudid PCA while in the hospital  - No current pain, has Oxycodone 5mg  PRN     Psychosocial:   - Substance abuse (opioid history). Sober for 5 years.  - Followed by Frederik Schmidt in [facility], placed referral for [facility] while admitted, are not seeing inpatients, will follow- up with him post discharge.   - Insomnia: Continue Melatonin 3mg  and Trazodone 50mg  nightly prn.   - Anxiety: Hospitalization related. Well controlled since discharge.     ** Klonopin 1mg  nightly-Discontinued, not taking it any more 03/15/20    Bone health:  Dexa scan from 10/13 resulted and shows low bone density.   - Seen by pharmacy and discussed zometa and calcium supplemenation 10/22     Summary:   -Day +42 today, doing well over all  -Increased Mag PO to three tab BID   -Encouraged increased PO fluid intake   -RTC on 03/17/20 for labs and APP visit     Future Appointments   Date Time Provider Department Center   03/15/2020 10:30 AM ONCINF CHAIR 02 [id number]   03/17/2020  8:45 AM ONCBMT LABS [facility]   03/17/2020  9:15 AM ONCBMT APP B [facility]   03/17/2020 10:30 AM ONCINF CHAIR 05 [id number]   03/20/2020  8:45 AM ONCBMT LABS [facility]   03/20/2020  9:15 AM ONCBMT APP B [facility]   03/20/2020 10:30 AM ONCINF CHAIR 02 [facility]   04/11/2020  8:15 AM ONCBMT LABS [facility]   04/11/2020  8:45 AM Artelia Laroche, MD [facility]   04/11/2020 10:00 AM ONCINF CHAIR 03 [facility]   04/26/2020  7:45 AM ONCBMT LABS [facility]   04/26/2020  8:15 AM ONCBMT APP B [facility]   04/26/2020  9:30 AM Bernerd Limbo, FNP [facility]   05/03/2020  9:15 AM ONCBMT LABS [facility]   05/03/2020  9:45 AM Artelia Laroche, MD [facility]     Patient seen and discussed with attending Dr.Jamieson     I personally saw and examined this patient. I participated in the key portions of the service. I have reviewed the Fellow's note and agree with the documented findings and plan. The note reflects my participation and input in the interval history, physical exam, and complex medical decision making. It was medically necessary for me to see the patient due to complex medical issues in the setting of complications of an allo-SCT.     [name] is Day +42 of transplant. He is weak and tired but continues to recover, albeit slowly. He has not significant complications. Will continue supportive care and monitor closely.  Lanae Boast, M.D.  Associate Professor of Medicine  Division of Hematology-Oncology  [facility]

## 2020-03-15 NOTE — Unmapped (Signed)
Bone Marrow Transplant and Cellular Therapy Program  Immunosuppressive Therapy Note    [name] is a 59 y.o. 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 [name] is currently day +42.    Current dose: 1 mg PO BID (dose decreased on 10/13)    Goal tacrolimus Level: 5-10 ng/mL    Resulted level: 7.1 (drawn appropriately)    Lab Results   Component Value Date/Time    TACROLIMUS 7.1 03/15/2020 09:00 AM    TACROLIMUS 7.2 03/13/2020 07:24 AM    TACROLIMUS 6.9 03/10/2020 07:27 AM     Lab Results   Component Value Date/Time    CREATININE 1.06 03/15/2020 09:00 AM    CREATININE 0.99 03/13/2020 07:24 AM    CREATININE 1.24 03/10/2020 07:27 AM     Tacrolimus level is therapeutic. Renal function is slightly up but overall stable. Plan to continue tacrolimus dose to 1 mg PO twice daily and recheck tacrolimus level and renal function at next clinic visit.     We will continue to monitor levels.  Patient will be followed for changes in renal and hepatic function, toxicity, and efficacy.     Rulon Abide, PharmD, BCOP  Clinical Pharmacist Practitioner, BMT

## 2020-03-16 LAB — EBV QUANTITATIVE PCR, BLOOD: EBV VIRAL LOAD RESULT: NOT DETECTED

## 2020-03-17 ENCOUNTER — Encounter: Admit: 2020-03-17 | Discharge: 2020-03-17 | Payer: PRIVATE HEALTH INSURANCE

## 2020-03-17 DIAGNOSIS — C9201 Acute myeloblastic leukemia, in remission: Principal | ICD-10-CM

## 2020-03-17 DIAGNOSIS — D649 Anemia, unspecified: Principal | ICD-10-CM

## 2020-03-17 DIAGNOSIS — Z9484 Stem cells transplant status: Principal | ICD-10-CM

## 2020-03-17 DIAGNOSIS — G5793 Unspecified mononeuropathy of bilateral lower limbs: Principal | ICD-10-CM

## 2020-03-17 DIAGNOSIS — D696 Thrombocytopenia, unspecified: Principal | ICD-10-CM

## 2020-03-17 DIAGNOSIS — D84822 Immunocompromised state associated with stem cell transplant (CMS-HCC): Principal | ICD-10-CM

## 2020-03-17 DIAGNOSIS — M8580 Other specified disorders of bone density and structure, unspecified site: Principal | ICD-10-CM

## 2020-03-17 LAB — CBC W/ AUTO DIFF
BASOPHILS ABSOLUTE COUNT: 0 10*9/L (ref 0.0–0.1)
BASOPHILS RELATIVE PERCENT: 0.3 %
EOSINOPHILS ABSOLUTE COUNT: 0.1 10*9/L (ref 0.0–0.4)
EOSINOPHILS RELATIVE PERCENT: 1.5 %
HEMATOCRIT: 30 % — ABNORMAL LOW (ref 41.0–53.0)
HEMOGLOBIN: 9.8 g/dL — ABNORMAL LOW (ref 13.5–17.5)
LARGE UNSTAINED CELLS: 2 % (ref 0–4)
LYMPHOCYTES ABSOLUTE COUNT: 0.7 10*9/L — ABNORMAL LOW (ref 1.5–5.0)
LYMPHOCYTES RELATIVE PERCENT: 8 %
MEAN CORPUSCULAR HEMOGLOBIN CONC: 32.7 g/dL (ref 31.0–37.0)
MEAN CORPUSCULAR HEMOGLOBIN: 37.8 pg — ABNORMAL HIGH (ref 26.0–34.0)
MEAN CORPUSCULAR VOLUME: 115.7 fL — ABNORMAL HIGH (ref 80.0–100.0)
MEAN PLATELET VOLUME: 9.7 fL (ref 7.0–10.0)
MONOCYTES ABSOLUTE COUNT: 0.5 10*9/L (ref 0.2–0.8)
MONOCYTES RELATIVE PERCENT: 6.4 %
NEUTROPHILS ABSOLUTE COUNT: 6.9 10*9/L (ref 2.0–7.5)
NEUTROPHILS RELATIVE PERCENT: 81.7 %
PLATELET COUNT: 139 10*9/L — ABNORMAL LOW (ref 150–440)
RED BLOOD CELL COUNT: 2.59 10*12/L — ABNORMAL LOW (ref 4.50–5.90)
RED CELL DISTRIBUTION WIDTH: 18.4 % — ABNORMAL HIGH (ref 12.0–15.0)
WBC ADJUSTED: 8.4 10*9/L (ref 4.5–11.0)

## 2020-03-17 LAB — COMPREHENSIVE METABOLIC PANEL
ALBUMIN: 3 g/dL — ABNORMAL LOW (ref 3.4–5.0)
ALKALINE PHOSPHATASE: 73 U/L (ref 46–116)
ALT (SGPT): <7 U/L — ABNORMAL LOW (ref 10–49)
ANION GAP: 4 mmol/L — ABNORMAL LOW (ref 5–14)
AST (SGOT): 16 U/L (ref <=34)
BILIRUBIN TOTAL: 0.3 mg/dL (ref 0.3–1.2)
BLOOD UREA NITROGEN: 17 mg/dL (ref 9–23)
BUN / CREAT RATIO: 18
CALCIUM: 9.3 mg/dL (ref 8.7–10.4)
CHLORIDE: 108 mmol/L — ABNORMAL HIGH (ref 98–107)
CO2: 29 mmol/L (ref 20.0–31.0)
CREATININE: 0.93 mg/dL
EGFR CKD-EPI AA MALE: >90 mL/min/{1.73_m2} (ref >=60)
EGFR CKD-EPI NON-AA MALE: >90 mL/min/{1.73_m2} (ref >=60)
GLUCOSE RANDOM: 121 mg/dL (ref 70–179)
POTASSIUM: 4 mmol/L (ref 3.4–4.5)
PROTEIN TOTAL: 5.4 g/dL — ABNORMAL LOW (ref 5.7–8.2)
SODIUM: 141 mmol/L (ref 135–145)

## 2020-03-17 LAB — MAGNESIUM: MAGNESIUM: 1.4 mg/dL — ABNORMAL LOW (ref 1.6–2.6)

## 2020-03-17 MED ORDER — MG-PLUS-PROTEIN 133 MG TABLET
ORAL_TABLET | Freq: Three times a day (TID) | ORAL | 3 refills | 30.00000 days | Status: CP
Start: 2020-03-17 — End: 2020-03-17
  Filled 2020-03-20: qty 200, 22d supply, fill #0

## 2020-03-17 MED ORDER — PREGABALIN 75 MG CAPSULE
ORAL_CAPSULE | 0 refills | 0 days | Status: CP
Start: 2020-03-17 — End: ?
  Filled 2020-03-20: qty 90, 30d supply, fill #0

## 2020-03-17 MED ORDER — MG-PLUS-PROTEIN 133 MG TABLET: 3 | tablet | Freq: Three times a day (TID) | 3 refills | 30 days | Status: AC

## 2020-03-17 MED ADMIN — heparin, porcine (PF) 100 unit/mL injection 200 Units: 200 [IU] | INTRAVENOUS | @ 12:00:00 | Stop: 2020-03-17

## 2020-03-17 NOTE — Unmapped (Signed)
Therapy Update Follow Up: No issues - Copay = $0

## 2020-03-17 NOTE — Unmapped (Unsigned)
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  Transplant was tolerated relatively well and primarily complicated by mucositis requiring PCA.    Interval History:   John Erickson presents to clinic today for routine follow up. ***    ROS:  A comprehensive ROS performed and is negative except for pertinent positives as listed above in interval history.     I reviewed and updated past medical, surgical, social, and family history as appropriate.       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,XY[1]    Variants of Known or Likely Clinical Significance  Gene Transcript  Predicted Protein  VAF (%)   CEBPA c.753_762del p.Ser251Argfs 41      Variants of Unknown Significance  Gene Transcript Predicted Protein VAF (%)   TET2 c.4946A>G p.Tyr1649Cys 49      -  FLT-3-ITD and FLT-3-TKD studies are negative    Pertinent Phenotypic data:    Disease-specific prognostic estimate:     Induction:   7+3+HD Dauno (C1D1 2/24)    Recovery Marrow:    Final Diagnosis   Date Value Ref Range Status   08/12/2019   Final    Bone marrow, right iliac, aspiration and biopsy  -  Normocellular bone marrow (60%) with trilineage hematopoiesis and 4% blasts by manual aspirate differential  -  Routine cytogenetic analysis is pending  -  Flow cytometric MRD analysis is pending    This electronic signature is attestation that the pathologist personally reviewed the submitted material(s) and the final diagnosis reflects that evaluation.            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,XY[1]  Variants of Known or Likely Clinical Significance  Gene Transcript  Predicted Protein  VAF (%)   CEBPA c.753_762del p.Ser251Argfs 41      Variants of Unknown Significance  Gene Transcript Predicted Protein VAF (%)   TET2 c.4946A>G p.Tyr1649Cys 49      -  FLT-3-ITD and FLT-3-TKD studies are negative     07/09/2019 Initial Diagnosis    AML (acute myeloid leukemia) (CMS-HCC)  07/14/2019 - 07/20/2019 Chemotherapy    IP LEUKEMIA 7+3 HIGH DOSE DAUNOrubicin  DAUNOrubicin 90 mg/m2 IV on Days 1, 2, 3  Cytarabine 100 mg/m2 CIVI on Days 1 to 7     07/27/2019 Biopsy    Bone marrow, right iliac, aspiration and biopsy  -  Hypocellular bone marrow (<5%) with treatment effect, markedly reduced trilineage hematopoiesis, and 3% blasts by manual aspirate differential (see Comment).     08/24/2019 - 09/28/2019 Chemotherapy    IP LEUKEMIA HIGH DOSE CYTARABINE CONSOLIDATION (3 G/M2)  cytarabine 3 g/m2 every 12 hours on days 1, 3 and 5 every 28 days     08/24/2019 - 12/26/2019 Chemotherapy    IP LEUKEMIA HIGH DOSE CYTARABINE CONSOLIDATION (3 G/M2) ON DAYS 1,2,3  cytarabine 3 g/m2 IV every 12 hours on days 1, 2, 3, every 28 days     01/19/2020 - 01/19/2020 Chemotherapy Days 1 to 7     07/27/2019 Biopsy    Bone marrow, right iliac, aspiration and biopsy  -  Hypocellular bone marrow (<5%) with treatment effect, markedly reduced trilineage hematopoiesis, and 3% blasts by manual aspirate differential (see Comment).     08/24/2019 - 09/28/2019 Chemotherapy    IP LEUKEMIA HIGH DOSE CYTARABINE CONSOLIDATION (3 G/M2)  cytarabine 3 g/m2 every 12 hours on days 1, 3 and 5 every 28 days     08/24/2019 - 12/26/2019 Chemotherapy    IP LEUKEMIA HIGH DOSE CYTARABINE CONSOLIDATION (3 G/M2) ON DAYS 1,2,3  cytarabine 3 g/m2 IV every 12 hours on days 1, 2, 3, every 28 days     01/19/2020 - 01/19/2020 Chemotherapy    BMT OP BUSULFAN TEST DOSE  Busulfan 0.8 mg/kg IV ONCE     01/26/2020 -  Chemotherapy    BMT IP MAC BUSULFAN / FLUDARABINE / rATG (MUD)   Busulfan IV Days -6 to -3  Fludarabine 40 mg/m2 IV Days -6 to -3  rATG 0.5 mg/kg IV Day -3, 1.5 mg/kg IV Day -2, 2.5 mg/kg IV Day -1       Physical exam:Temp:  [36.3 ??C (97.4 ??F)] 36.3 ??C (97.4 ??F)  Heart Rate:  [70] 70  BP: (115)/(83) 115/83   BP 115/83  - Pulse 70  - Temp 36.3 ??C (97.4 ??F) (Temporal)  - Resp 16  - Ht 170.2 cm (5' 7.01)  - Wt 79.6 kg (175 lb 7.8 oz)  - SpO2 99%  - BMI 27.48 kg/m??   70, Cares for self; unable to carry on normal activity or to do active work (ECOG equivalent 1)    General: No acute distress noted.   Central venous access: Line clean, dry, intact. No erythema or drainage noted.   ENT: Moist mucous membranes. Oropharhynx without lesions, erythema or exudate.   Cardiovascular: Pulse normal rate, regularity and rhythm. S1 and S2 normal, without any murmur, rub, or gallop.  Lungs: Clear to auscultation bilaterally, without wheezes/crackles/rhonchi. Good air movement.   Skin: Warm, dry, intact. No rash noted.    Psychiatry: Alert and oriented to person, place, and time.   Gastrointestinal/Abdomen: Normoactive bowel sounds, abdomen soft, non-tender   Musculoskeletal/Extremities: FROM throughout. No edema  Neurologic: CNII-XII intact. Normal strength and sensation throughout    Labs:  I reviewed all labs from today in Epic. See EMR for lab results.      Lab Results   Component Value Date    WBC 8.4 03/17/2020    HGB 9.8 (L) 03/17/2020    HCT 30.0 (L) 03/17/2020  PLT 139 (L) 03/17/2020     Lab Results   Component Value Date    NA 141 03/17/2020    K 4.0 03/17/2020    CL 108 (H) 03/17/2020    CO2 29.0 03/17/2020    BUN 17 03/17/2020    CREATININE 0.93 03/17/2020    GLU 121 03/17/2020    CALCIUM 9.3 03/17/2020    MG 1.4 (L) 03/17/2020    PHOS 4.1 02/22/2020     Lab Results   Component Value Date    BILITOT 0.3 03/17/2020    BILIDIR 0.30 02/21/2020    PROT 5.4 (L) 03/17/2020    ALBUMIN 3.0 (L) 03/17/2020    ALT <7 (L) 03/17/2020    AST 16 03/17/2020    ALKPHOS 73 03/17/2020    GGT 21 01/26/2020     Lab Results   Component Value Date    PT 14.1 (H) 02/22/2020    INR 1.21 02/22/2020    APTT 33.2 02/17/2020     Assessment and Plan    BMT:??AML, CR MRD Negative  HCT-CI (age adjusted) 5??(psych, severe pulmonary dysfunction, and age)   Conditioning:??MAC Bu/Flu/ATG  Donor:??9/10, ABO O+, CMV+  Engraftment:??Granix starting D+12 through engraftment (as defined as ANC 1.0 x 2 days or 3.0 x 1 day)  - Date of last granix injection: 02/19/20    GVHD prophylaxis:??  1.Tacrolimus being managed by pharmacy with a goal of 5-10 ng/mL  2. Methotrexate??15 mg/m2 IVP on day +1 then 10 mg/m2 on days +3, +6 and +11  3. ATG per [facility] standard dosing will??be included  ??  Heme:??  Transfusion criteria:??1 unit of PRBCs for Hgb<7 and 1 unit of platelets for Plt <10K or bleeding.   -??No history of transfusion reactions.     CVL:  - Skin irritation around edge of dressing is healing. He is doing better with the biopatch rather than the CHG gel patch.     ID:??  Prophylaxis:  - Antiviral: Continue Valtrex 500 mg po daily   - Antifungal: Continue Fluconazole 400 mg po daily, continue through day +75  - PJP: Continue Dapsone 100mg  PO daily (has Sulfa allergy).  - CMV D+/R+: Continue  Letermovir Vitamin D  ??  Hepatic:??  - No active issues.   - VOD prophylaxis with Actigall TID.     CV:    HTN: New, possibly tac related  - Continue Amlodipine 5mg  daily   - Trace LE edema    Pulm: DLCO 62%  - Former smoker, quit 01/12/20. Using nicotine patch currently. Has chronic dry cough but PFTs were acceptable.   07/24/19: CT chest w/ 0.6cm RUL nodules, no clear etiology.     ** Discussed with Dr. Oswald Hillock prior to transplant, ok to move forward, no plans to repeat unless new findings.       Neuro/Pain:??  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   - Bilateral palms and feet intermittent paraesthesias: Slowly improving with lyrica.    Mucositis: (chemotherapy induced)   - Required Dilaudid PCA while in the hospital  - No current pain, has Oxycodone 5mg  PRN     Psychosocial:??  - Substance abuse (opioid history). Sober for 5 years.  - Followed by Frederik Schmidt in [facility], placed referral for [facility] while admitted, are not seeing inpatients, will follow- up with him post discharge.   - Insomnia: Continue Melatonin??3mg  and Trazodone 50mg  nightly prn.   - Anxiety: Hospitalization related. Well controlled since discharge.     **  Klonopin 1mg  nightly-Discontinued 03/15/20 as not taking it any more    Bone health:  Dexa scan from 10/13 resulted and shows low bone density.   - Seen by pharmacy and discussed zometa and calcium supplemenation 10/22  - ZOmeta ***  ??  Summary:   -RTC Monday and Thursday next week ***     Future Appointments   Date Time Provider Department Center   03/17/2020  8:45 AM ONCBMT LABS [facility]   03/17/2020  9:15 AM Artice Bergerson Royall Loanne Emery, PA [facility]   03/17/2020 10:30 AM ONCINF CHAIR 05 [facility]   03/20/2020  8:45 AM ONCBMT LABS [facility]   03/20/2020  9:15 AM ONCBMT APP B [facility]   03/20/2020 10:30 AM ONCINF CHAIR 02 [facility]   04/11/2020  8:15 AM ONCBMT LABS [facility]   04/11/2020  8:45 AM Artelia Laroche, MD [facility]   04/11/2020 10:00 AM ONCINF CHAIR 03 [facility]   04/26/2020  7:45 AM ONCBMT LABS [facility]   04/26/2020  8:15 AM ONCBMT APP B [facility]   04/26/2020  9:30 AM Bernerd Limbo, FNP [facility]   05/03/2020  9:15 AM ONCBMT LABS [facility]   05/03/2020  9:45 AM Artelia Laroche, MD [facility]     Patient seen and discussed with attending Dr.Jamieson

## 2020-03-17 NOTE — Unmapped (Signed)
It was great seeing you today.     For your magnesium: increase to 3 tablets three times a day.     For your neuropathy: increase your evening dose of Lyrica to 2 capsules, continue 1 capsule in the AM.      We will plan for Zometa on Monday.   --------------------------------------  21st Century Cures Act   Regarding labs and other test results, please know that due to federal laws, all test results are now released and available for review on MyChart immediately upon becoming available. This means that unlike before, you will now receive results before we have had the opportunity to review them and either call to discuss or send you message/letter with an explanation after all results are back.     For some patients, seeing test results without explanation can cause anxiety about those results and even misunderstanding if a patient interprets them incorrectly. We regret any such anxiety you may experience if you choose to review labs before we have discussed them with you. Please note, however, that to prevent anxiety, we recommend that you consider waiting to review your results until you receive a call, message, or letter from [place]. That makes sure that you receive the interpretation with your labs, which can help to avoid misunderstood results and perhaps undue anxiety. Either way, please know we monitor your test results closely.  ------------------------------------------  COVID-19 information:  Given ongoing novel coronavirus (SARS-CoV-2/COVID-19), it is strongly recommended to avoid travel and crowds.  If you develop fever, cough, shortness of breath and/or have known exposure (close contact < 6 feet) with someone who has tested positive, please notify us immediately.    ??? Your best defenses are to stay home as much as possible and use good hand hygiene.  ??? We recommend that you wear a mask (N95 mask preferred) when leaving your home  ??? Important:   o A previous negative test does not mean you will never become infected  o It is unknown if a prior COVID-19 infection provides immunity against future infections    It is important for you to know that the available vaccines are safe.  However, they have not been tested in transplant/cellular therapy patients.  Therefore, we do not know if transplant/cellular therapy patients will develop an immune response (make antibody) against the virus.  We still recommend you obtain the vaccine as long as you are at least 3 months after your transplant/cellular therapy.  We do not recommend the vaccine sooner than 3 months as it is very unlikely that you will respond.        We continue to strongly recommended you avoid travel and crowds.  You should always wear a mask (N95) if you are around anyone outside of your own home, use good hand hygiene, and practice social distancing (>6 feet).  For the foreseeable future, these things should continue even after you are vaccinated. If you develop fever, cough, shortness of breath and/or have known exposure (close contact < 6 feet) with someone who has tested positive, please notify us immediately.          Click Here to Visit [facility] Covid-19 Vaccine Hub  Get the latest facts on the COVID-19 vaccines.      Or visit [facility]???s COVID-19 Vaccine Hub at [email or web address] to review the latest facts about the vaccines.     -------------------------------------------------------------------------------  Return to clinic on Monday and Thursday to see one of the providers. You will receive  a time when you check out today.    Lab Results   Component Value Date    WBC 8.4 03/17/2020    HGB 9.8 (L) 03/17/2020    HCT 30.0 (L) 03/17/2020    PLT 139 (L) 03/17/2020     Lab Results   Component Value Date    NA 141 03/17/2020    K 4.0 03/17/2020    CL 108 (H) 03/17/2020    CO2 29.0 03/17/2020    BUN 17 03/17/2020    CREATININE 0.93 03/17/2020    GLU 121 03/17/2020    CALCIUM 9.3 03/17/2020    MG 1.4 (L) 03/17/2020    PHOS 4.1 02/22/2020 Lab Results   Component Value Date    BILITOT 0.3 03/17/2020    BILIDIR 0.30 02/21/2020    PROT 5.4 (L) 03/17/2020    ALBUMIN 3.0 (L) 03/17/2020    ALT <7 (L) 03/17/2020    AST 16 03/17/2020    ALKPHOS 73 03/17/2020    GGT 21 01/26/2020     Lab Results   Component Value Date    INR 1.21 02/22/2020    APTT 33.2 02/17/2020       For prescription refills:   For refills, please check your medication bottles to see if you have additional refills left. If so, please call your pharmacy and follow the directions to request a refill. If you do not have any refills left, please make a request during your clinic visit or by submitting a request through MyChart or by calling [phone]. Please allow 24 hours if your request is made during the week or 48 hours if requests are made on the weekends or holidays.     --------------------------------------------------------------------------------------------------------------------  For appointments & questions Monday through Friday 8 AM-4:30 PM     Please call [phone] or Toll free [phone]    On Nights, Weekends and Holidays  Call [phone] and ask for the oncologist on call    Please visit [email or web address], a resource created just for family members and caregivers.  This website lists support services, how and where to ask for help. It has tools to assist you as you help [place] care for your loved one.    [facility]  [address]  [place], [place] [address]  [email or web address]

## 2020-03-20 ENCOUNTER — Ambulatory Visit: Admit: 2020-03-20 | Discharge: 2020-03-21 | Payer: PRIVATE HEALTH INSURANCE

## 2020-03-20 ENCOUNTER — Ambulatory Visit: Admit: 2020-03-20 | Discharge: 2020-03-20 | Payer: PRIVATE HEALTH INSURANCE

## 2020-03-20 ENCOUNTER — Encounter: Admit: 2020-03-20 | Discharge: 2020-03-20 | Payer: PRIVATE HEALTH INSURANCE

## 2020-03-20 DIAGNOSIS — Z48290 Encounter for aftercare following bone marrow transplant: Principal | ICD-10-CM

## 2020-03-20 DIAGNOSIS — Z9484 Stem cells transplant status: Principal | ICD-10-CM

## 2020-03-20 DIAGNOSIS — D649 Anemia, unspecified: Principal | ICD-10-CM

## 2020-03-20 DIAGNOSIS — Z006 Encounter for examination for normal comparison and control in clinical research program: Principal | ICD-10-CM

## 2020-03-20 DIAGNOSIS — C9200 Acute myeloblastic leukemia, not having achieved remission: Principal | ICD-10-CM

## 2020-03-20 DIAGNOSIS — D84822 Immunocompromised state associated with stem cell transplant (CMS-HCC): Secondary | ICD-10-CM

## 2020-03-20 DIAGNOSIS — R238 Other skin changes: Principal | ICD-10-CM

## 2020-03-20 DIAGNOSIS — D696 Thrombocytopenia, unspecified: Principal | ICD-10-CM

## 2020-03-20 DIAGNOSIS — C9201 Acute myeloblastic leukemia, in remission: Principal | ICD-10-CM

## 2020-03-20 LAB — COMPREHENSIVE METABOLIC PANEL
ALBUMIN: 2.9 g/dL — ABNORMAL LOW (ref 3.4–5.0)
ALT (SGPT): <7 U/L — ABNORMAL LOW (ref 10–49)
ANION GAP: 8 mmol/L (ref 5–14)
AST (SGOT): 14 U/L (ref <=34)
BILIRUBIN TOTAL: 0.3 mg/dL (ref 0.3–1.2)
BLOOD UREA NITROGEN: 18 mg/dL (ref 9–23)
BUN / CREAT RATIO: 17
CALCIUM: 9.3 mg/dL (ref 8.7–10.4)
CHLORIDE: 106 mmol/L (ref 98–107)
CO2: 28 mmol/L (ref 20.0–31.0)
CREATININE: 1.09 mg/dL
EGFR CKD-EPI AA MALE: 87 mL/min/{1.73_m2} (ref >=60)
EGFR CKD-EPI NON-AA MALE: 75 mL/min/{1.73_m2} (ref >=60)
GLUCOSE RANDOM: 112 mg/dL (ref 70–179)
POTASSIUM: 3.8 mmol/L (ref 3.4–4.5)
PROTEIN TOTAL: 5.4 g/dL — ABNORMAL LOW (ref 5.7–8.2)
SODIUM: 142 mmol/L (ref 135–145)

## 2020-03-20 LAB — CBC W/ AUTO DIFF
BASOPHILS ABSOLUTE COUNT: 0 10*9/L (ref 0.0–0.1)
BASOPHILS RELATIVE PERCENT: 0.6 %
EOSINOPHILS ABSOLUTE COUNT: 0.1 10*9/L (ref 0.0–0.4)
EOSINOPHILS RELATIVE PERCENT: 2.2 %
HEMATOCRIT: 29.9 % — ABNORMAL LOW (ref 41.0–53.0)
HEMOGLOBIN: 9.7 g/dL — ABNORMAL LOW (ref 13.5–17.5)
LARGE UNSTAINED CELLS: 3 % (ref 0–4)
LYMPHOCYTES ABSOLUTE COUNT: 0.6 10*9/L — ABNORMAL LOW (ref 1.5–5.0)
LYMPHOCYTES RELATIVE PERCENT: 12.1 %
MEAN CORPUSCULAR HEMOGLOBIN CONC: 32.3 g/dL (ref 31.0–37.0)
MEAN CORPUSCULAR HEMOGLOBIN: 37.7 pg — ABNORMAL HIGH (ref 26.0–34.0)
MEAN CORPUSCULAR VOLUME: 117 fL — ABNORMAL HIGH (ref 80.0–100.0)
MEAN PLATELET VOLUME: 10 fL (ref 7.0–10.0)
MONOCYTES ABSOLUTE COUNT: 0.5 10*9/L (ref 0.2–0.8)
MONOCYTES RELATIVE PERCENT: 9.4 %
NEUTROPHILS ABSOLUTE COUNT: 3.9 10*9/L (ref 2.0–7.5)
NEUTROPHILS RELATIVE PERCENT: 73.1 %
RED BLOOD CELL COUNT: 2.56 10*12/L — ABNORMAL LOW (ref 4.50–5.90)
RED CELL DISTRIBUTION WIDTH: 17.9 % — ABNORMAL HIGH (ref 12.0–15.0)
WBC ADJUSTED: 5.3 10*9/L (ref 4.5–11.0)

## 2020-03-20 LAB — SMEAR REVIEW

## 2020-03-20 LAB — CMV DNA, QUANTITATIVE, PCR: CMV VIRAL LD: NOT DETECTED

## 2020-03-20 LAB — MAGNESIUM: Magnesium:MCnc:Pt:Ser/Plas:Qn:: 1.5 — ABNORMAL LOW

## 2020-03-20 LAB — CO2: Carbon dioxide:SCnc:Pt:Ser/Plas:Qn:: 28

## 2020-03-20 LAB — TACROLIMUS, TROUGH: Lab: 7.4

## 2020-03-20 LAB — LYMPHOCYTES ABSOLUTE COUNT: Lymphocytes:NCnc:Pt:Bld:Qn:Automated count: 0.6 — ABNORMAL LOW

## 2020-03-20 LAB — CMV QUANT: Lab: 0

## 2020-03-20 MED ORDER — VALACYCLOVIR 500 MG TABLET
ORAL_TABLET | Freq: Three times a day (TID) | ORAL | 0 refills | 7.00000 days | Status: CP
Start: 2020-03-20 — End: 2020-03-27

## 2020-03-20 MED ADMIN — heparin, porcine (PF) 100 unit/mL injection 200 Units: 200 [IU] | INTRAVENOUS | @ 14:00:00 | Stop: 2020-03-20

## 2020-03-20 MED FILL — MG-PLUS-PROTEIN 133 MG TABLET: 22 days supply | Qty: 200 | Fill #0 | Status: AC

## 2020-03-20 MED FILL — PREGABALIN 75 MG CAPSULE: 30 days supply | Qty: 90 | Fill #0 | Status: AC

## 2020-03-20 NOTE — Unmapped (Signed)
Varicella blood lab and swab (of R forearm) sent to lab.

## 2020-03-20 NOTE — Unmapped (Addendum)
[facility] Clinical Assessment & Refill Coordination Note    John Erickson, DOB: January 18, 1961  Phone: [phone] (home) [phone] (work)    All above HIPAA information was verified with patient's family member, wife.     Was a Nurse, learning disability used for this call? No    Specialty Medication(s):   Infectious Disease: Prevymis     Current Outpatient Medications   Medication Sig Dispense Refill   ??? calcium carbonate (OS-CAL) 1,500 mg (600 mg elem calcium) tablet Take 1 tablet by mouth Two (2) times a day.     ??? cholecalciferol, vitamin D3-25 mcg, 1,000 unit,, (VITAMIN D3) 25 mcg (1,000 unit) capsule Take 25 mcg by mouth daily.      ??? dapsone 100 MG tablet Take 1 tablet (100 mg total) by mouth daily. 30 tablet 5   ??? famotidine (PEPCID) 20 MG tablet Take 1 tablet (20 mg total) by mouth Two (2) times a day. (Patient not taking: Reported on 03/17/2020) 60 tablet 2   ??? fluconazole (DIFLUCAN) 200 MG tablet Take 2 tablets (400 mg total) by mouth daily. 60 tablet 2   ??? letermovir (PREVYMIS) 480 mg tablet Take 1 tablet (480 mg total) by mouth daily. 28 tablet 2   ??? magnesium oxide-Mg AA chelate (MAGNESIUM, AMINO ACID CHELATE,) 133 mg Tab Take 3 tablets by mouth Three (3) times a day before meals. 270 tablet 3   ??? melatonin 1 mg Tab tablet Take 2 mg by mouth nightly. (Patient not taking: Reported on 03/17/2020)     ??? nicotine (NICODERM CQ) 21 mg/24 hr patch Place 1 patch on the skin daily. 28 patch 11   ??? pregabalin (LYRICA) 75 MG capsule Take 75mg  (1 capsule) in the AM and 150mg  (2 capsules) in the PM 90 capsule 0   ??? prochlorperazine (COMPAZINE) 10 MG tablet TAKE 1 TABLET (10 MG TOTAL) BY MOUTH EVERY SIX (6) HOURS AS NEEDED FOR NAUSEA. 30 tablet 2   ??? tacrolimus (PROGRAF) 0.5 MG capsule Take 2 capsules (1 mg total) by mouth two (2) times a day. 120 capsule 5   ??? traZODone (DESYREL) 50 MG tablet Take 1 tablet (50 mg total) by mouth nightly as needed for sleep (insomnia). 90 tablet 3   ??? ursodioL (ACTIGALL) 300 mg capsule Take 1 capsule (300 mg total) by mouth Three (3) times a day. 90 capsule 11   ??? valACYclovir (VALTREX) 500 MG tablet Take 1 tablet (500 mg total) by mouth daily. 30 tablet 11     No current facility-administered medications for this visit.        Changes to medications: [name] reports no changes at this time.    Allergies   Allergen Reactions   ??? Penicillins Hives   ??? Sulfa (Sulfonamide Antibiotics) Other (See Comments) and Rash     Unknown    Other reaction(s): Unknown       Changes to allergies: No    SPECIALTY MEDICATION ADHERENCE     Prevymis 480 mg: unsure how many days of medicine on hand - not at home currently  Tacrolimus 0.5 mg: unsure how many days of medicine on hand - doesn't need refill - has a lot left since dose decreased to 1mg  bid  Medication Adherence    Patient reported X missed doses in the last month: 0  Specialty Medication: letermovir 480 mg daily  Patient is on additional specialty medications: No  Patient is on more than two specialty medications: No  Informant: spouse  Confirmed plan for next specialty medication refill: delivery by pharmacy          Specialty medication(s) dose(s) confirmed: Tac dose is currently 1 mg bid     Are there any concerns with adherence? No    Adherence counseling provided? Not needed    CLINICAL MANAGEMENT AND INTERVENTION      Clinical Benefit Assessment:    Do you feel the medicine is effective or helping your condition? Yes    Clinical Benefit counseling provided? Not needed    Adverse Effects Assessment:    Are you experiencing any side effects? No    Are you experiencing difficulty administering your medicine? 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 For example, brushing your teeth or getting up in the morning. 0    Have you discussed this with your provider? Not needed    Therapy Appropriateness:    Is therapy appropriate? Yes, therapy is appropriate and should be continued    DISEASE/MEDICATION-SPECIFIC INFORMATION      N/A    PATIENT SPECIFIC NEEDS     - Does the patient have any physical, cognitive, or cultural barriers? No    - Is the patient high risk? No    - Does the patient require a Care Management Plan? No     - Does the patient require physician intervention or other additional services (i.e. nutrition, smoking cessation, social work)? No      SHIPPING     Specialty Medication(s) to be Shipped:   Infectious Disease: Prevymis    Other medication(s) to be shipped: valacyclovir, ursodiol and fluconazole     Changes to insurance: No    Delivery Scheduled: Yes, Expected medication delivery date: 03/23/20.     Medication will be delivered via Next Day Courier to the confirmed prescription address in [facility].    The patient will receive a drug information handout for each medication shipped and additional FDA Medication Guides as required.  Verified that patient has previously received a Conservation officer, historic buildings.    All of the patient's questions and concerns have been addressed.    Breck Coons [facility] Specialty Pharmacist

## 2020-03-20 NOTE — Unmapped (Signed)
Called and left a voicemail with 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 Advised him Dr. Oswald Hillock was not on the call for the portion of this discussion so still waiting to hear back from her and will let [name] know if there are any other changes. Advised him we will plan on a scheduled admit on Wednesday 03/22/20 for treatment of disseminated shingles with IV acyclovir.

## 2020-03-20 NOTE — Unmapped (Signed)
Bone Marrow Transplant and Cellular Therapy Program  Immunosuppressive Therapy Note    [name] is a 59 y.o. 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 [name] is currently day +47.    Current dose: 1 mg PO BID (dose decreased on 10/13)    Goal tacrolimus Level: 5-10 ng/mL    Resulted level: 7.4 ng/mL (drawn appropriately)    Lab Results   Component Value Date/Time    TACROLIMUS 7.4 03/20/2020 09:34 AM    TACROLIMUS 7.1 03/15/2020 09:00 AM    TACROLIMUS 7.2 03/13/2020 07:24 AM     Lab Results   Component Value Date/Time    CREATININE 1.09 03/20/2020 09:34 AM    CREATININE 0.93 03/17/2020 08:21 AM    CREATININE 1.06 03/15/2020 09:00 AM     Tacrolimus level is therapeutic. Renal function is slightly up but overall stable. Plan to continue tacrolimus dose of 1 mg PO twice daily and recheck tacrolimus level and renal function at next clinic visit.     We will continue to monitor levels.  Patient will be followed for changes in renal and hepatic function, toxicity, and efficacy.     Dallas Schimke, PharmD, CPP  BMT Clinical Pharmacist Practitioner

## 2020-03-20 NOTE — Unmapped (Addendum)
Your rash appears to be shingles. We will send a swab and blood test today to check for this. We recommend admission for IV treatment, however, if you are choosing not to admit we would like you to increase Valtrex to 1g (2 tablets) three times a day.     You need to call immediately if you develop any worsening rash or any lesions on your face.     I will let you know about scheduling admission Wednesday vs coming to clinic Thursday.     I will call you if I need to make changes to your tacrolimus dose.     After discharge from the hospital, there may be times when you might need blood products, fluids or electrolyte replacement and other things requiring an appointment in our infusion area after your lab draw and provider visit.  To make waiting times less, we will schedule all recently discharged patients in infusion. Your provider will let you know after looking at your lab work whether the appointment will be needed or not.   --------------------------------------  21st Century Cures Act   Regarding labs and other test results, please know that due to federal laws, all test results are now released and available for review on MyChart immediately upon becoming available. This means that unlike before, you will now receive results before we have had the opportunity to review them and either call to discuss or send you message/letter with an explanation after all results are back.     For some patients, seeing test results without explanation can cause anxiety about those results and even misunderstanding if a patient interprets them incorrectly. We regret any such anxiety you may experience if you choose to review labs before we have discussed them with you. Please note, however, that to prevent anxiety, we recommend that you consider waiting to review your results until you receive a call, message, or letter from [place]. That makes sure that you receive the interpretation with your labs, which can help to avoid misunderstood results and perhaps undue anxiety. Either way, please know we monitor your test results closely.  ------------------------------------------  COVID-19 information:  Given ongoing novel coronavirus (SARS-CoV-2/COVID-19), it is strongly recommended to avoid travel and crowds.  If you develop fever, cough, shortness of breath and/or have known exposure (close contact < 6 feet) with someone who has tested positive, please notify us immediately.    ??? Your best defenses are to stay home as much as possible and use good hand hygiene.  ??? We recommend that you wear a mask (N95 mask preferred) when leaving your home  ??? Important:   o A previous negative test does not mean you will never become infected  o It is unknown if a prior COVID-19 infection provides immunity against future infections    It is important for you to know that the available vaccines are safe.  However, they have not been tested in transplant/cellular therapy patients.  Therefore, we do not know if transplant/cellular therapy patients will develop an immune response (make antibody) against the virus.  We still recommend you obtain the vaccine as long as you are at least 3 months after your transplant/cellular therapy.  We do not recommend the vaccine sooner than 3 months as it is very unlikely that you will respond.        We continue to strongly recommended you avoid travel and crowds.  You should always wear a mask (N95) if you are around anyone outside of your  own home, use good hand hygiene, and practice social distancing (>6 feet).  For the foreseeable future, these things should continue even after you are vaccinated. If you develop fever, cough, shortness of breath and/or have known exposure (close contact < 6 feet) with someone who has tested positive, please notify us immediately.          Click Here to Visit [facility] Covid-19 Vaccine Hub  Get the latest facts on the COVID-19 vaccines.      Or visit [facility]???s COVID-19 Vaccine Hub at [email or web address] to review the latest facts about the vaccines.     -------------------------------------------------------------------------------      Lab Results   Component Value Date    WBC 5.3 03/20/2020    HGB 9.7 (L) 03/20/2020    HCT 29.9 (L) 03/20/2020    PLT 131 (L) 03/20/2020     Lab Results   Component Value Date    NA 142 03/20/2020    K 3.8 03/20/2020    CL 106 03/20/2020    CO2 28.0 03/20/2020    BUN 18 03/20/2020    CREATININE 1.09 03/20/2020    GLU 112 03/20/2020    CALCIUM 9.3 03/20/2020    MG 1.5 (L) 03/20/2020    PHOS 4.1 02/22/2020     Lab Results   Component Value Date    BILITOT 0.3 03/20/2020    BILIDIR 0.30 02/21/2020    PROT 5.4 (L) 03/20/2020    ALBUMIN 2.9 (L) 03/20/2020    ALT <7 (L) 03/20/2020    AST 14 03/20/2020    ALKPHOS 68 03/20/2020    GGT 21 01/26/2020     Lab Results   Component Value Date    INR 1.21 02/22/2020    APTT 33.2 02/17/2020       For prescription refills:   For refills, please check your medication bottles to see if you have additional refills left. If so, please call your pharmacy and follow the directions to request a refill. If you do not have any refills left, please make a request during your clinic visit or by submitting a request through MyChart or by calling [phone]. Please allow 24 hours if your request is made during the week or 48 hours if requests are made on the weekends or holidays.     --------------------------------------------------------------------------------------------------------------------  For appointments & questions Monday through Friday 8 AM-4:30 PM     Please call [phone] or Toll free [phone]    On Nights, Weekends and Holidays  Call [phone] and ask for the oncologist on call    Please visit PrivacyFever.cz, a resource created just for family members and caregivers.  This website lists support services, how and where to ask for help. It has tools to assist you as you help [place] care for your loved one.    [facility]  [address]  [place], [place] [address]  [email or web address]

## 2020-03-20 NOTE — Unmapped (Signed)
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  Transplant was tolerated relatively well and primarily complicated by mucositis requiring PCA.    Interval History:   [name] presents today with his fiance for routine f/u. States he is overall doing well, but has more lesion outbreaks since his office visit on 10/25. Per his clinic note on 10/25, there was a 1cm x .2cm lesion on the anterior right AC fossa, and since that visit noticed a grouping of lesions over his sternum late 10/29, and noticed lesions forming on the back of his right knee and lateral elbow. Reports mild pain before the lesions popped with increased pain once open. The original lesions on the right arm had scabbed over before the 10/29 visit, and d/t cold weather he mentioned he forgot about the scabbed lesion because it was covered with his long sleeve so this was not mentioned during his visit. The lesions on his sternum have also now scabbed over. Continues to have itching on his upper back d/t dry skin, but no rash or lesions present today. Denies fever, cough, flu-like smx, substernal chest discomfort, sob, abdominal pain, eye/mouth irritation or dryness, hemoptysis, hematochezia, hematuria, dysuria, uni/bilateral LE swelling.    Continuing to drink 2-3 bottles of water a day as well as multiple glasses of milk and sweet tea. Improved appetite, and states he remembers his prescription of the new Lyrica dose was supposed to start 11/1 tonight. Has not started the new Lyrica dose. Did not take his Tac dose this morning.    ROS:  A comprehensive ROS performed and is negative except for pertinent positives as listed above in interval history.     I reviewed and updated past medical, surgical, social, and family history as appropriate.     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,XY[1]    Variants of Known or Likely Clinical Significance  Gene Transcript  Predicted Protein  VAF (%)   CEBPA [id number] p.Ser251Argfs 41      Variants of Unknown Significance  Gene Transcript Predicted Protein VAF (%)   TET2 c.4946A>G p.Tyr1649Cys 49      -  FLT-3-ITD and FLT-3-TKD studies are negative    Pertinent Phenotypic data:    Disease-specific prognostic estimate:     Induction:   7+3+HD Dauno (C1D1 2/24)    Recovery Marrow:    Final Diagnosis   Date Value Ref Range Status   08/12/2019   Final    Bone marrow, right iliac, aspiration and biopsy  -  Normocellular bone marrow (60%) with trilineage hematopoiesis and 4% blasts by manual aspirate differential  -  Routine cytogenetic analysis is pending  -  Flow cytometric MRD analysis is pending    This electronic signature is attestation that the  pathologist personally reviewed the submitted material(s) and the final diagnosis reflects that evaluation.            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,XY[1]  Variants of Known or Likely Clinical Significance  Gene Transcript  Predicted Protein  VAF (%)   CEBPA c.753_762del p.Ser251Argfs 41      Variants of Unknown Significance  Gene Transcript Predicted Protein VAF (%)   TET2 c.4946A>G p.Tyr1649Cys 49      -  FLT-3-ITD and FLT-3-TKD studies are negative     07/09/2019 Initial Diagnosis    AML (acute myeloid leukemia) (CMS-HCC)     07/14/2019 - 07/20/2019 Chemotherapy    IP LEUKEMIA 7+3 HIGH DOSE DAUNOrubicin  DAUNOrubicin 90 mg/m2 IV on Days 1, 2, 3  Cytarabine 100 mg/m2 CIVI on Days 1 to 7     07/27/2019 Biopsy    Bone marrow, right iliac, aspiration and biopsy  -  Hypocellular bone marrow (<5%) with treatment effect, markedly reduced trilineage hematopoiesis, and 3% blasts by manual aspirate differential (see Comment).     08/24/2019 - 09/28/2019 Chemotherapy    IP LEUKEMIA HIGH DOSE CYTARABINE CONSOLIDATION (3 G/M2)  cytarabine 3 g/m2 every 12 hours on days 1, 3 and 5 every 28 days     08/24/2019 - 12/26/2019 Chemotherapy    IP LEUKEMIA HIGH DOSE CYTARABINE CONSOLIDATION (3 G/M2) ON DAYS 1,2,3  cytarabine 3 g/m2 IV every 12 hours on days 1, 2, 3, every 28 days     01/19/2020 - 01/19/2020 Chemotherapy    BMT OP BUSULFAN TEST DOSE  Busulfan 0.8 mg/kg IV ONCE     01/26/2020 -  Chemotherapy    BMT IP MAC BUSULFAN / FLUDARABINE / rATG (MUD)   Busulfan IV Days -6 to -3  Fludarabine 40 mg/m2 IV Days -6 to -3  rATG 0.5 mg/kg IV Day -3, 1.5 mg/kg IV Day -2, 2.5 mg/kg IV Day -1       Physical exam:Temp:  [37 ??C (98.6 ??F)] 37 ??C (98.6 ??F)  Heart Rate:  [68] 68  BP: (119)/(77) 119/77   BP 119/77  - Pulse 68  - Temp 37 ??C (98.6 ??F) (Oral)  - Resp 18  - Ht 170.2 cm (5' 7.01)  - Wt 80.6 kg (177 lb 12.8 oz)  - SpO2 99%  - BMI 27.84 kg/m??   70, Cares for self; unable to carry on normal activity or to do active work (ECOG equivalent 1)    General: No acute distress noted.   Central Venous Access: Line c/d/i; No erythema, TTP, or drainage noted. Port c/d/i; No erythema, TTP, or  drainage noted  ENT: Moist mucous membranes. Oropharhynx without lesions, erythema or exudate.   Cardiovascular: Pulse normal rate, regularity and rhythm. S1 and S2 normal, without any murmur, rub, or gallop.  Lungs: Clear to auscultation bilaterally,  without wheezes/crackles/rhonchi. Good air movement.   Skin: Grouped scabbed lesions over xyphoid process with excoriations; no induration or surrounding erythema. Grouped scabbed lesions over anterior right AC fossa w/ excoriations; no induration or surrounding erythema. Grouped small fluid filled lesions on lateral aspect of right elbow, posterior right knee, and left flank; no induration or surrounding erythema.   Psychiatry: Alert and oriented to person, place, and time.   Gastrointestinal/Abdomen: Normoactive bowel sounds, abdomen soft, non-tender   Musculoskeletal/Extremities: FROM throughout. No edema  Neurologic: CNII-XII intact. Normal strength and sensation throughout      Labs:  I reviewed all labs from today in Epic. See EMR for lab results.        Lab Results   Component Value Date    WBC 8.4 03/17/2020    HGB 9.8 (L) 03/17/2020    HCT 30.0 (L) 03/17/2020    PLT 139 (L) 03/17/2020     Lab Results   Component Value Date    NA 142 03/20/2020    K 3.8 03/20/2020    CL 106 03/20/2020    CO2 28.0 03/20/2020    BUN 18 03/20/2020    CREATININE 1.09 03/20/2020    GLU 112 03/20/2020    CALCIUM 9.3 03/20/2020    MG 1.5 (L) 03/20/2020    PHOS 4.1 02/22/2020     Lab Results   Component Value Date    BILITOT 0.3 03/20/2020    BILIDIR 0.30 02/21/2020    PROT 5.4 (L) 03/20/2020    ALBUMIN 2.9 (L) 03/20/2020    ALT <7 (L) 03/20/2020    AST 14 03/20/2020    ALKPHOS 68 03/20/2020    GGT 21 01/26/2020     Lab Results   Component Value Date    PT 14.1 (H) 02/22/2020    INR 1.21 02/22/2020    APTT 33.2 02/17/2020     Assessment and Plan    BMT:??AML, CR MRD Negative  HCT-CI (age adjusted) 5??(psych, severe pulmonary dysfunction, and age)   Conditioning:??MAC Bu/Flu/ATG  Donor:??9/10, ABO O+, CMV+  Engraftment:??Granix starting D+12 through engraftment (as defined as ANC 1.0 x 2 days or 3.0 x 1 day)  - Date of last granix injection: 02/19/20    GVHD prophylaxis:??  1.Tacrolimus being managed by pharmacy with a goal of 5-10 ng/mL  2. Methotrexate??15 mg/m2 IVP on day +1 then 10 mg/m2 on days +3, +6 and +11  3. ATG per [facility] standard dosing will??be included  ??  Heme:??  Transfusion criteria:??1 unit of PRBCs for Hgb<7 and 1 unit of platelets for Plt <10K or bleeding.   -??No history of transfusion reactions.     CVL:  - Skin irritation around edge of dressing is healing. Continued improvement with the biopatch rather than the CHG gel patch.     ID:??  Prophylaxis:  - Antiviral: Valtrex 1g TID (see shingles below)   - Antifungal: Continue Fluconazole 400 mg po daily, continue through day +75  - PJP: Continue Dapsone 100mg  PO daily (has Sulfa allergy).  - CMV D+/R+: Continue Letermovir prophy, 480mg  po daily, Continue through D+100    Shingles:  - Initial AC fossa lesion 10/25, now scabbed. New sternal vesicular lesions evening 10/29, now scabbed. New vesicular lesions on right lateral forearm and right medial posterior knee, new as of 10/31. Lesions are painful after rupture.   - Discussed treatment with IV acyclovir for disseminated Zoster and patient refused admission until Wednesday 03/22/20. Will start Valtrex 1g TID in the meantime  -  VZV swab and serum VZV PCR sent today, results pending.   ??  Neutropenic Fever:  02/03/20: Tmax 38.4 Cefepime (02/03/20 - 02/09/20)  - Cultures NGx 5 days  ??  02/13/20: Spiked again at 5am 9/26  - Cultures negative at 5 days  - Started on IV cefepime (9/26- 10/1) and IV flagyl (anaerobic coverage given mucositis) (9/26-10/1). Received a dose of IV vanc but no indication to continue.   ??  Allergy:  - PCN allergy (hives) has tolerated Cefepime without issues    Seasonal Allergies:  - Flonase and Claritin daily prn.   ??  EBV:  - IgM Ab + 01/05/20 but viral load 01/05/20 not detected.   - Viral Load 10/25, not detected.    GI:??  GERD prophylaxis:  - Continue Pepcid BID  ??  Renal:   AKI:   -03/20/20: AKI improved, Scr 1.09 today.    - Continue to push PO fluids.     FEN:   Hyperkalemia: Now resolved  - Stopped oral potassium 10/8 due to high level from AKI  - Potassium continues to be within normal range.     Hypomagnesemia  - Mg 1.5 today, continue Mag Chelate 3 tablets TID.   ??  Hepatic:??  - No active issues.   - VOD prophylaxis with Actigall TID.     CV:    HTN: Likely tac induced  - Now resolved, currently not on any antihypertensives.     Pulm: DLCO 62%  - Former smoker, quit 01/12/20. Using nicotine patch currently. Has chronic dry cough but PFTs were acceptable.   07/24/19: CT chest w/ 0.6cm RUL nodules, no clear etiology.     ** Discussed with Dr. Oswald Hillock prior to transplant, ok to move forward, no plans to repeat unless new findings.       Neuro/Pain:??  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   - 03/20/20: Pt reports not starting new Lyrica dose on date of dose adjustment, and planned on starting new Lyrica dose tonight.     Mucositis: (chemotherapy induced)   - Required Dilaudid PCA while in the hospital  - No current pain, Continue Oxycodone 5mg  PRN     Psychosocial:??  - Substance abuse (opioid history). Sober for 5 years.  - Followed by Frederik Schmidt in [facility], placed referral for [facility] while admitted, are not seeing inpatients, will follow- up with him post discharge.   - Insomnia: Continue Trazodone 50mg  nightly prn.     ** Not taking melatonin.   - Anxiety: Hospitalization related. Well controlled since discharge.     ** Klonopin 1mg  nightly-Discontinued 03/15/20 as not taking it any more    Bone health:  Dexa scan from 10/13 resulted and shows low bone density.   - Seen by pharmacy and discussed zometa and calcium supplemenation 10/22  - Zometa scheduled for 03/23/20    ** He has a dental appointment Monday 11/1 but just for impressions for partial implant (it is a non-invasive dental procedure as the implants are clip on with a retainer, not surgically attached, so ok to proceed with Zometa)   - Discussed with patient in detail about talking with his Dentist prior to his procedure today to inform him of a potential shingles infection. Advised him we do not recommend attending his dental appointment.   ??  Summary:   - Patient refused admit today for IV acyclovir for presumed disseminated shingles. Plan for admit 03/22/20 for treatment      Zipporah Plants, PA-S  [facility]

## 2020-03-21 LAB — VZV PCR, BLOOD: Varicella zoster virus DNA:PrThr:Pt:Ser:Ord:Probe.amp.tar: NEGATIVE

## 2020-03-21 NOTE — Unmapped (Signed)
Cassie called and left a message to see if the plan was still to admit John Erickson tomorrow, told her the swab for varicella came back negative but we were waiting for the blood test which should result by 4 pm today, if that were to be positive, we would want to admit it for treatment, if negative, he was ok to wait to scheduled appointment on Thursday, she verbalized understanding

## 2020-03-22 MED FILL — URSODIOL 300 MG CAPSULE: 30 days supply | Qty: 90 | Fill #0 | Status: AC

## 2020-03-22 MED FILL — URSODIOL 300 MG CAPSULE: ORAL | 30 days supply | Qty: 90 | Fill #0

## 2020-03-22 MED FILL — PREVYMIS 480 MG TABLET: 28 days supply | Qty: 28 | Fill #1 | Status: AC

## 2020-03-22 MED FILL — FLUCONAZOLE 200 MG TABLET: 30 days supply | Qty: 60 | Fill #0 | Status: AC

## 2020-03-22 MED FILL — PREVYMIS 480 MG TABLET: ORAL | 28 days supply | Qty: 28 | Fill #1

## 2020-03-22 MED FILL — FLUCONAZOLE 200 MG TABLET: ORAL | 30 days supply | Qty: 60 | Fill #0

## 2020-03-22 NOTE — Unmapped (Signed)
Called and let John and John Erickson know that his Varicella blood level was negative so he would not need to be admitted tomorrow, she states that she feels like the rash is better, 'all the blisters have popped', he is feeling good, she wondered if the rash was GVH, told her to keep his scheduled appointment on Thursday and he will be evaluated then, she verbalized understanding

## 2020-03-23 ENCOUNTER — Encounter: Admit: 2020-03-23 | Discharge: 2020-03-24 | Payer: PRIVATE HEALTH INSURANCE

## 2020-03-23 DIAGNOSIS — C9200 Acute myeloblastic leukemia, not having achieved remission: Principal | ICD-10-CM

## 2020-03-23 DIAGNOSIS — Z880 Allergy status to penicillin: Principal | ICD-10-CM

## 2020-03-23 DIAGNOSIS — R21 Rash and other nonspecific skin eruption: Principal | ICD-10-CM

## 2020-03-23 DIAGNOSIS — Z9481 Bone marrow transplant status: Principal | ICD-10-CM

## 2020-03-23 DIAGNOSIS — Z006 Encounter for examination for normal comparison and control in clinical research program: Principal | ICD-10-CM

## 2020-03-23 DIAGNOSIS — R110 Nausea: Principal | ICD-10-CM

## 2020-03-23 DIAGNOSIS — Z9484 Stem cells transplant status: Principal | ICD-10-CM

## 2020-03-23 DIAGNOSIS — C9201 Acute myeloblastic leukemia, in remission: Principal | ICD-10-CM

## 2020-03-23 DIAGNOSIS — M8580 Other specified disorders of bone density and structure, unspecified site: Principal | ICD-10-CM

## 2020-03-23 DIAGNOSIS — I10 Essential (primary) hypertension: Principal | ICD-10-CM

## 2020-03-23 LAB — COMPREHENSIVE METABOLIC PANEL
ALBUMIN: 3.2 g/dL — ABNORMAL LOW (ref 3.4–5.0)
ALT (SGPT): 9 U/L — ABNORMAL LOW (ref 10–49)
ANION GAP: 5 mmol/L (ref 5–14)
AST (SGOT): 16 U/L (ref <=34)
BILIRUBIN TOTAL: 0.3 mg/dL (ref 0.3–1.2)
BLOOD UREA NITROGEN: 14 mg/dL (ref 9–23)
BUN / CREAT RATIO: 12
CALCIUM: 9.2 mg/dL (ref 8.7–10.4)
CHLORIDE: 107 mmol/L (ref 98–107)
CO2: 28 mmol/L (ref 20.0–31.0)
CREATININE: 1.14 mg/dL
EGFR CKD-EPI AA MALE: 82 mL/min/{1.73_m2} (ref >=60)
EGFR CKD-EPI NON-AA MALE: 71 mL/min/{1.73_m2} (ref >=60)
GLUCOSE RANDOM: 165 mg/dL (ref 70–179)
POTASSIUM: 4 mmol/L (ref 3.4–4.5)
PROTEIN TOTAL: 5.5 g/dL — ABNORMAL LOW (ref 5.7–8.2)

## 2020-03-23 LAB — CBC W/ AUTO DIFF
BASOPHILS ABSOLUTE COUNT: 0 10*9/L (ref 0.0–0.1)
BASOPHILS RELATIVE PERCENT: 0.4 %
EOSINOPHILS ABSOLUTE COUNT: 0.1 10*9/L (ref 0.0–0.4)
EOSINOPHILS RELATIVE PERCENT: 1.7 %
HEMOGLOBIN: 10.6 g/dL — ABNORMAL LOW (ref 13.5–17.5)
LARGE UNSTAINED CELLS: 4 % (ref 0–4)
LYMPHOCYTES ABSOLUTE COUNT: 0.6 10*9/L — ABNORMAL LOW (ref 1.5–5.0)
LYMPHOCYTES RELATIVE PERCENT: 9.7 %
MEAN CORPUSCULAR HEMOGLOBIN CONC: 33.3 g/dL (ref 31.0–37.0)
MEAN CORPUSCULAR HEMOGLOBIN: 38.1 pg — ABNORMAL HIGH (ref 26.0–34.0)
MEAN CORPUSCULAR VOLUME: 114.6 fL — ABNORMAL HIGH (ref 80.0–100.0)
MEAN PLATELET VOLUME: 9.7 fL (ref 7.0–10.0)
MONOCYTES ABSOLUTE COUNT: 0.5 10*9/L (ref 0.2–0.8)
MONOCYTES RELATIVE PERCENT: 7.9 %
NEUTROPHILS ABSOLUTE COUNT: 4.5 10*9/L (ref 2.0–7.5)
NEUTROPHILS RELATIVE PERCENT: 76.5 %
PLATELET COUNT: 119 10*9/L — ABNORMAL LOW (ref 150–440)
RED BLOOD CELL COUNT: 2.77 10*12/L — ABNORMAL LOW (ref 4.50–5.90)
RED CELL DISTRIBUTION WIDTH: 16.7 % — ABNORMAL HIGH (ref 12.0–15.0)
WBC ADJUSTED: 5.9 10*9/L (ref 4.5–11.0)

## 2020-03-23 LAB — MAGNESIUM: Magnesium:MCnc:Pt:Ser/Plas:Qn:: 1.4 — ABNORMAL LOW

## 2020-03-23 LAB — SODIUM: Sodium:SCnc:Pt:Ser/Plas:Qn:: 140

## 2020-03-23 LAB — PLATELET COUNT: Platelets:NCnc:Pt:Bld:Qn:Automated count: 119 — ABNORMAL LOW

## 2020-03-23 LAB — EBV VIRAL LOAD RESULT: Lab: NOT DETECTED

## 2020-03-23 LAB — TACROLIMUS, TROUGH: Lab: 8.4

## 2020-03-23 MED ORDER — VALACYCLOVIR 500 MG TABLET
ORAL_TABLET | Freq: Every day | ORAL | 0 refills | 30 days
Start: 2020-03-23 — End: 2020-04-26

## 2020-03-23 MED ADMIN — zoledronic acid (ZOMETA) 4 mg in sodium chloride (NS) 0.9 % 100 mL IVPB: 4 mg | INTRAVENOUS | @ 16:00:00 | Stop: 2020-03-23

## 2020-03-23 MED ADMIN — heparin, porcine (PF) 100 unit/mL injection 200 Units: 200 [IU] | INTRAVENOUS | @ 16:00:00 | Stop: 2020-03-24

## 2020-03-23 MED ADMIN — heparin, porcine (PF) 100 unit/mL injection 200 Units: 200 [IU] | INTRAVENOUS | @ 14:00:00 | Stop: 2020-03-23

## 2020-03-23 MED ADMIN — sodium chloride (NS) 0.9 % infusion: 20 mL/h | INTRAVENOUS | @ 16:00:00 | Stop: 2020-03-23

## 2020-03-23 NOTE — Unmapped (Signed)
Patient received Zometa according to therapy plan protocol. The medicalo lumen of his CVC was flushed and heparin locked. He declined an AVS and was discharged.

## 2020-03-23 NOTE — Unmapped (Signed)
Bone Marrow Transplant and Cellular Therapy Program  Immunosuppressive Therapy Note    [name] is a 59 y.o. 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 [name] is currently day +50.    Current dose: 1 mg PO BID (dose decreased on 10/13)    Goal tacrolimus Level: 5-10 ng/mL    Resulted level: 8.4 ng/mL (drawn appropriately)    Lab Results   Component Value Date/Time    TACROLIMUS 8.4 03/23/2020 09:35 AM    TACROLIMUS 7.4 03/20/2020 09:34 AM    TACROLIMUS 7.1 03/15/2020 09:00 AM     Lab Results   Component Value Date/Time    CREATININE 1.14 03/23/2020 09:35 AM    CREATININE 1.09 03/20/2020 09:34 AM    CREATININE 0.93 03/17/2020 08:21 AM     Tacrolimus level is therapeutic. Renal function is slightly up today. Plan to continue tacrolimus dose of 1 mg PO twice daily and recheck tacrolimus level and renal function at next clinic visit with plan to decrease dose at that point if creatinine and tacrolimus levels continue trending up.     We will continue to monitor levels.  Patient will be followed for changes in renal and hepatic function, toxicity, and efficacy.     Rulon Abide, PharmD, BCOP  Clinical Pharmacist Practitioner, BMT

## 2020-03-23 NOTE — Unmapped (Signed)
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  Transplant was tolerated relatively well and primarily complicated by mucositis requiring PCA.    Interval History:   [name] presents today with his fiance for f/u.     October 23rd first noticed a blister on on his arm, but they continue to pop up and then burst, and scab over this is only on the right side.  These do not itch or hurt.  He only knows they are there if he hits them.  He has a newest one covered with a bandaid on his right hand.      He was taking Valtrex 1g TID from Monday 11/1 until 11/3 when he decreased to 500mg  TID.      Minimal swelling in feet.  No change in vision or headaches.  Eating and drinking adequately.  Nausea occasionally, but no change in this and mostly occurs on empty stomach with pills.  No change in bowels.     ROS:  A comprehensive ROS performed and is negative except for pertinent positives as listed above in interval history.     I reviewed and updated past medical, surgical, social, and family history as appropriate.     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,XY[1]    Variants of Known or Likely Clinical Significance  Gene Transcript  Predicted Protein  VAF (%)   CEBPA c.[id number] p.Ser251Argfs 41      Variants of Unknown Significance  Gene Transcript Predicted Protein VAF (%)   TET2 c.4946A>G p.Tyr1649Cys 49      -  FLT-3-ITD and FLT-3-TKD studies are negative    Pertinent Phenotypic data:    Disease-specific prognostic estimate:     Induction:   7+3+HD Dauno (C1D1 2/24)    Recovery Marrow:    Final Diagnosis   Date Value Ref Range Status   08/12/2019   Final    Bone marrow, right iliac, aspiration and biopsy  -  Normocellular bone marrow (60%) with trilineage hematopoiesis and 4% blasts by manual aspirate differential  -  Routine cytogenetic analysis is pending  -  Flow cytometric MRD analysis is pending    This electronic signature is attestation that the pathologist personally reviewed the submitted material(s) and the final diagnosis reflects that evaluation.            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           Molecular Genetics: No results found for:  MYELOIDMP       AML (acute myeloid leukemia) (CMS-HCC)   07/09/2019 Biopsy    Bone marrow, left iliac, aspiration and biopsy  -  Hypercellular bone marrow (95%) involved by acute myeloid leukemia (42% blasts by manual aspirate differential)   Abnormal Karyotype: 46,Y,t(X;8)(q26;q11.2)[19]/46,XY[1]  Variants of Known or Likely Clinical Significance  Gene Transcript  Predicted Protein  VAF (%)   CEBPA c.753_762del p.Ser251Argfs 41      Variants of Unknown Significance  Gene Transcript Predicted Protein VAF (%)   TET2 c.4946A>G p.Tyr1649Cys 49      -  FLT-3-ITD and FLT-3-TKD studies are negative     07/09/2019 Initial Diagnosis    AML (acute myeloid leukemia) (CMS-HCC)     07/14/2019 - 07/20/2019 Chemotherapy    IP LEUKEMIA 7+3 HIGH DOSE DAUNOrubicin  DAUNOrubicin 90 mg/m2 IV on Days 1, 2, 3  Cytarabine 100 mg/m2 CIVI on Days 1 to 7     07/27/2019 Biopsy    Bone marrow, right iliac, aspiration and biopsy  -  Hypocellular bone marrow (<5%) with treatment effect, markedly reduced trilineage hematopoiesis, and 3% blasts by manual aspirate differential (see Comment).     08/24/2019 - 09/28/2019 Chemotherapy    IP LEUKEMIA HIGH DOSE CYTARABINE CONSOLIDATION (3 G/M2)  cytarabine 3 g/m2 every 12 hours on days 1, 3 and 5 every 28 days     08/24/2019 - 12/26/2019 Chemotherapy    IP LEUKEMIA HIGH DOSE CYTARABINE CONSOLIDATION (3 G/M2) ON DAYS 1,2,3  cytarabine 3 g/m2 IV every 12 hours on days 1, 2, 3, every 28 days     01/19/2020 - 01/19/2020 Chemotherapy    BMT OP BUSULFAN TEST DOSE  Busulfan 0.8 mg/kg IV ONCE     01/26/2020 -  Chemotherapy    BMT IP MAC BUSULFAN / FLUDARABINE / rATG (MUD)   Busulfan IV Days -6 to -3  Fludarabine 40 mg/m2 IV Days -6 to -3  rATG 0.5 mg/kg IV Day -3, 1.5 mg/kg IV Day -2, 2.5 mg/kg IV Day -1       Physical exam:Temp:  [36.6 ??C (97.9 ??F)] (P) 36.6 ??C (97.9 ??F)  Heart Rate:  [87] (P) 87  BP: (P) 121/85   BP (P) 121/85  - Pulse (P) 87  - Temp (P) 36.6 ??C (97.9 ??F) (Oral)  - Resp (P) 16  - SpO2 (P) 97%   70, Cares for self; unable to carry on normal activity or to do active work (ECOG equivalent 1)    General: No acute distress noted.   Central Venous Access: Line c/d/i; No erythema, TTP, or drainage noted.   ENT: Moist mucous membranes. Oropharhynx without lesions, erythema or exudate.   Cardiovascular: Pulse normal rate, regularity and rhythm. S1 and S2 normal, without any murmur, rub, or gallop.  Lungs: Clear to auscultation bilaterally, without wheezes/crackles/rhonchi. Good air movement.   Skin: Few scattered scabs that are tiny with several appearing as linear and thin looking like a scratch but pt is clear these were long thin blisters that are resolving.  Lesions are non painful or itchy.  VZV negative from vesicle swab and blood.  One lesion very small blister.     Psychiatry: Alert and oriented to person, place, and time.   Gastrointestinal/Abdomen: Normoactive bowel sounds, abdomen soft, non-tender   Musculoskeletal/Extremities: FROM throughout. No edema  Neurologic: CNII-XII intact. Normal strength and sensation throughout    Labs:  I reviewed all labs from today in Epic. See EMR for lab results.  Lab Results   Component Value Date    WBC 5.9 03/23/2020    HGB 10.6 (L) 03/23/2020    HCT 31.8 (L) 03/23/2020    PLT 119 (L) 03/23/2020     Lab Results   Component Value Date    NA 140 03/23/2020    K 4.0 03/23/2020    CL 107 03/23/2020    CO2 28.0 03/23/2020    BUN 14 03/23/2020    CREATININE 1.14 03/23/2020    GLU 165 03/23/2020    CALCIUM 9.2 03/23/2020    MG 1.4 (L) 03/23/2020    PHOS 4.1 02/22/2020     Lab Results   Component Value Date    BILITOT 0.3 03/23/2020    BILIDIR 0.30 02/21/2020    PROT 5.5 (L) 03/23/2020    ALBUMIN 3.2 (L) 03/23/2020    ALT 9 (L) 03/23/2020    AST 16 03/23/2020    ALKPHOS 68 03/23/2020    GGT 21 01/26/2020     Lab Results   Component Value Date    PT 14.1 (H) 02/22/2020    INR 1.21 02/22/2020    APTT 33.2 02/17/2020     Assessment and Plan    BMT:??AML, CR MRD Negative  HCT-CI (age adjusted) 5??(psych, severe pulmonary dysfunction, and age)   Conditioning:??MAC Bu/Flu/ATG  Donor:??9/10, ABO O+, CMV+  Engraftment:??Granix starting D+12 through engraftment (as defined as ANC 1.0 x 2 days or 3.0 x 1 day)  - Date of last granix injection: 02/19/20    GVHD prophylaxis:??  1.Tacrolimus being managed by pharmacy with a goal of 5-10 ng/mL  2. Methotrexate??15 mg/m2 IVP on day +1 then 10 mg/m2 on days +3, +6 and +11  3. ATG per [facility] standard dosing will??be included  ??  Heme:??  Transfusion criteria:??1 unit of PRBCs for Hgb<7 and 1 unit of platelets for Plt <10K or bleeding.   -??No history of transfusion reactions.     ID:??  Prophylaxis:  - Antiviral: Valtrex 500mg  daily (decreased back to prophy on 11/4)   - Antifungal: Continue Fluconazole 400 mg po daily, continue through day +75  - PJP: Continue Dapsone 100mg  PO daily (has Sulfa allergy).  - CMV D+/R+: Continue Letermovir prophy, 480mg  po daily, Continue through D+100    Vesicular lesions:  - Initial AC fossa lesion 10/25, now scabbed. New sternal vesicular lesions evening 10/29, now scabbed. New vesicular lesions on right lateral forearm and right medial posterior knee, new as of 10/31. Scattered new lesions appearing as much as two weeks later.  Only on right side of body.  Vesicles form in a linear pattern in places, sometimes as long as 5-6cm but very thin  - Discussed treatment with IV acyclovir for disseminated Zoster and patient refused admission until Wednesday 03/22/20, however swabs returned as negative for VZV from vesicle swab and blood.   - Valtrex 1g TID from 11/1-11/3 with small new lesion on thumb.  With negative viral swab and longevity of symptoms less likely infectious cause.   - Possibly dermatitis?  Pt will monitor and we will refer to dermatology if this persists.   ??  Neutropenic Fever:  02/03/20: Tmax 38.4 Cefepime (02/03/20 - 02/09/20)  - Cultures NGx 5 days  ??  02/13/20: Spiked again at 5am 9/26  - Cultures negative at 5 days  - Started on IV cefepime (9/26- 10/1) and IV flagyl (anaerobic coverage given mucositis) (9/26-10/1). Received a dose of IV vanc but no indication to continue.   ??  Allergy:  - PCN allergy (hives) has tolerated Cefepime without issues    Seasonal Allergies:  - Flonase and Claritin daily prn.   ??  EBV:  - IgM Ab + 01/05/20 but viral load 01/05/20 not detected.   - Viral Load 10/25, not detected.    GI:??  GERD prophylaxis:  - has now stopped pepcid and doing well off this  ??  Renal:   AKI:   -03/23/20: AKI improved, Scr 1.1 today.    - Continue to push PO fluids.     FEN:   Hyperkalemia: Now resolved  - Stopped oral potassium 10/8 due to high level from AKI  - Potassium continues to be within normal range.     Hypomagnesemia  - Mg 1.4 today, continue Mag Chelate 3 tablets TID.   ??  Hepatic:??  - No active issues.   - VOD prophylaxis with Actigall TID.     CV:    HTN: Likely tac induced  - Now resolved, currently not on any antihypertensives.     Pulm: DLCO 62%  - Former smoker, quit 01/12/20. Using nicotine patch currently. Has chronic dry cough but PFTs were acceptable.   07/24/19: CT chest w/ 0.6cm RUL nodules, no clear etiology.     ** Discussed with Dr. Oswald Hillock prior to transplant, ok to move forward, no plans to repeat unless new findings.       Neuro/Pain:??  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     Mucositis: (chemotherapy induced)   - Required Dilaudid PCA while in the hospital  - No current pain, Continue Oxycodone 5mg  PRN     Psychosocial:??  - Substance abuse (opioid history). Sober for 5 years.  - Followed by Frederik Schmidt in [facility], placed referral for [facility] while admitted, are not seeing inpatients, will follow- up with him post discharge.   - Insomnia: Continue Trazodone 50mg  nightly prn.     ** Not taking melatonin.   - Anxiety: Hospitalization related. Well controlled since discharge.     ** Klonopin 1mg  nightly-Discontinued 03/15/20 as not taking it any more    Bone health:  Dexa scan from 10/13 resulted and shows low bone density.   - Seen by pharmacy and discussed zometa and calcium supplemenation 10/22  - Zometa scheduled for 03/23/20    ** He has a dental appointment Monday 11/1 but just for impressions for partial implant (it is a non-invasive dental procedure as the implants are clip on with a retainer, not surgically attached, so ok to proceed with Zometa)   - Postponed due to possible shingles can now reschedule   ??  Summary:   - Doing well overall with no evidence of GVHD or infection.  Unusual rash not consistent with shingles, will monitor and if worsens now on prophylactic Valtrex will reswab or biopsy and consider derm referral.    Elara Cocke Elie Confer, [facility]  Physician Assistant  Adult Bone Marrow Transplantation    I personally spent 65 minutes face-to-face and non-face-to-face in the care of this patient, which includes all pre, intra, and post visit time on the date of service.

## 2020-03-23 NOTE — Unmapped (Signed)
Instructions:   - A BMT pharmacist will call you if I need to make changes to your tacrolimus dose.   - My best guess would be that this is a dermatitis  - We will see you next week on Tues/Fri  - Call if the rash gets worse or you get a red rash    Return to clinic on Tuesday to see one of the providers.     Covid:   Given ongoing novel coronavirus (COVID-19), it is strongly recommended to avoid travel and crowds.  If you develop fever, cough, shortness of breath and/or have known exposure (close contact < 3 feet) with someone who has tested positive, please notify us immediately.    ??? Your best defense is to stay home as much as possible and use good hand hygiene.    For prescription refills:   For refills, please check your medication bottles to see if you have additional refills left. If so, please call your pharmacy and follow the directions to request a refill. If you do not have any refills left, please make a request during your clinic visit or by submitting a request through MyChart or by calling [phone]. Please allow 24 hours if your request is made during the week or 48 hours if requests are made on the weekends or holidays.     Labs:   Lab Results   Component Value Date    WBC 5.9 03/23/2020    HGB 10.6 (L) 03/23/2020    HCT 31.8 (L) 03/23/2020    PLT 119 (L) 03/23/2020     Lab Results   Component Value Date    NA 140 03/23/2020    K 4.0 03/23/2020    CL 107 03/23/2020    CO2 28.0 03/23/2020    BUN 14 03/23/2020    CREATININE 1.14 03/23/2020    GLU 165 03/23/2020    CALCIUM 9.2 03/23/2020    MG 1.4 (L) 03/23/2020    PHOS 4.1 02/22/2020     Lab Results   Component Value Date    BILITOT 0.3 03/23/2020    BILIDIR 0.30 02/21/2020    PROT 5.5 (L) 03/23/2020    ALBUMIN 3.2 (L) 03/23/2020    ALT 9 (L) 03/23/2020    AST 16 03/23/2020    ALKPHOS 68 03/23/2020    GGT 21 01/26/2020     Lab Results   Component Value Date    INR 1.21 02/22/2020    APTT 33.2 02/17/2020 --------------------------------------------------------------------------------------------------------------------  For appointments & questions Monday through Friday 8 AM-4:30 PM     Please call [phone] or Toll free [phone]    On Nights, Weekends and Holidays  Call [phone] and ask for the oncologist on call    Please visit PrivacyFever.cz, a resource created just for family members and caregivers.  This website lists support services, how and where to ask for help. It has tools to assist you as you help [place] care for your loved one.    [facility]  [address]  [place], [place] [address]  [email or web address]

## 2020-03-28 ENCOUNTER — Encounter: Admit: 2020-03-28 | Discharge: 2020-03-28 | Payer: PRIVATE HEALTH INSURANCE

## 2020-03-28 DIAGNOSIS — Z880 Allergy status to penicillin: Principal | ICD-10-CM

## 2020-03-28 DIAGNOSIS — N179 Acute kidney failure, unspecified: Principal | ICD-10-CM

## 2020-03-28 DIAGNOSIS — Z79899 Other long term (current) drug therapy: Principal | ICD-10-CM

## 2020-03-28 DIAGNOSIS — G629 Polyneuropathy, unspecified: Principal | ICD-10-CM

## 2020-03-28 DIAGNOSIS — C9201 Acute myeloblastic leukemia, in remission: Principal | ICD-10-CM

## 2020-03-28 DIAGNOSIS — R21 Rash and other nonspecific skin eruption: Principal | ICD-10-CM

## 2020-03-28 DIAGNOSIS — R600 Localized edema: Principal | ICD-10-CM

## 2020-03-28 DIAGNOSIS — Z9221 Personal history of antineoplastic chemotherapy: Principal | ICD-10-CM

## 2020-03-28 DIAGNOSIS — F419 Anxiety disorder, unspecified: Principal | ICD-10-CM

## 2020-03-28 DIAGNOSIS — G4700 Insomnia, unspecified: Principal | ICD-10-CM

## 2020-03-28 DIAGNOSIS — D696 Thrombocytopenia, unspecified: Principal | ICD-10-CM

## 2020-03-28 DIAGNOSIS — Z87891 Personal history of nicotine dependence: Principal | ICD-10-CM

## 2020-03-28 DIAGNOSIS — J302 Other seasonal allergic rhinitis: Principal | ICD-10-CM

## 2020-03-28 DIAGNOSIS — Z9484 Stem cells transplant status: Principal | ICD-10-CM

## 2020-03-28 DIAGNOSIS — R110 Nausea: Principal | ICD-10-CM

## 2020-03-28 DIAGNOSIS — R234 Changes in skin texture: Principal | ICD-10-CM

## 2020-03-28 LAB — CBC W/ AUTO DIFF
BASOPHILS ABSOLUTE COUNT: 0 10*9/L (ref 0.0–0.1)
BASOPHILS RELATIVE PERCENT: 0.5 %
EOSINOPHILS ABSOLUTE COUNT: 0.2 10*9/L (ref 0.0–0.4)
EOSINOPHILS RELATIVE PERCENT: 4.5 %
HEMATOCRIT: 32.9 % — ABNORMAL LOW (ref 41.0–53.0)
HEMOGLOBIN: 10.8 g/dL — ABNORMAL LOW (ref 13.5–17.5)
LARGE UNSTAINED CELLS: 3 % (ref 0–4)
LYMPHOCYTES ABSOLUTE COUNT: 0.7 10*9/L — ABNORMAL LOW (ref 1.5–5.0)
LYMPHOCYTES RELATIVE PERCENT: 15.7 %
MEAN CORPUSCULAR HEMOGLOBIN CONC: 32.8 g/dL (ref 31.0–37.0)
MEAN CORPUSCULAR HEMOGLOBIN: 38.1 pg — ABNORMAL HIGH (ref 26.0–34.0)
MEAN CORPUSCULAR VOLUME: 116.1 fL — ABNORMAL HIGH (ref 80.0–100.0)
MEAN PLATELET VOLUME: 10.4 fL — ABNORMAL HIGH (ref 7.0–10.0)
MONOCYTES ABSOLUTE COUNT: 0.5 10*9/L (ref 0.2–0.8)
MONOCYTES RELATIVE PERCENT: 11.3 %
NEUTROPHILS ABSOLUTE COUNT: 2.8 10*9/L (ref 2.0–7.5)
NEUTROPHILS RELATIVE PERCENT: 64.9 %
PLATELET COUNT: 99 10*9/L — ABNORMAL LOW (ref 150–440)
RED BLOOD CELL COUNT: 2.83 10*12/L — ABNORMAL LOW (ref 4.50–5.90)
RED CELL DISTRIBUTION WIDTH: 16.4 % — ABNORMAL HIGH (ref 12.0–15.0)
WBC ADJUSTED: 4.3 10*9/L — ABNORMAL LOW (ref 4.5–11.0)

## 2020-03-28 LAB — COMPREHENSIVE METABOLIC PANEL
ALBUMIN: 3 g/dL — ABNORMAL LOW (ref 3.4–5.0)
ALKALINE PHOSPHATASE: 68 U/L (ref 46–116)
ALT (SGPT): 10 U/L (ref 10–49)
ANION GAP: 4 mmol/L — ABNORMAL LOW (ref 5–14)
AST (SGOT): 22 U/L (ref <=34)
BILIRUBIN TOTAL: 0.3 mg/dL (ref 0.3–1.2)
BLOOD UREA NITROGEN: 16 mg/dL (ref 9–23)
BUN / CREAT RATIO: 12
CALCIUM: 9.1 mg/dL (ref 8.7–10.4)
CHLORIDE: 106 mmol/L (ref 98–107)
CO2: 29 mmol/L (ref 20.0–31.0)
CREATININE: 1.32 mg/dL — ABNORMAL HIGH
EGFR CKD-EPI AA MALE: 69 mL/min/{1.73_m2} (ref >=60)
EGFR CKD-EPI NON-AA MALE: 59 mL/min/{1.73_m2} — ABNORMAL LOW (ref >=60)
GLUCOSE RANDOM: 193 mg/dL — ABNORMAL HIGH (ref 70–179)
POTASSIUM: 3.6 mmol/L (ref 3.4–4.5)
PROTEIN TOTAL: 5.5 g/dL — ABNORMAL LOW (ref 5.7–8.2)
SODIUM: 139 mmol/L (ref 135–145)

## 2020-03-28 LAB — TACROLIMUS LEVEL, TROUGH: TACROLIMUS, TROUGH: 10.4 ng/mL (ref 5.0–15.0)

## 2020-03-28 LAB — SLIDE REVIEW

## 2020-03-28 LAB — MAGNESIUM: MAGNESIUM: 1.8 mg/dL (ref 1.6–2.6)

## 2020-03-28 MED ORDER — ONDANSETRON HCL 8 MG TABLET
ORAL_TABLET | Freq: Three times a day (TID) | ORAL | 2 refills | 10.00000 days | Status: CP | PRN
Start: 2020-03-28 — End: 2021-03-28

## 2020-03-28 MED ORDER — TACROLIMUS 0.5 MG CAPSULE, IMMEDIATE-RELEASE
ORAL_CAPSULE | ORAL | 5 refills | 30.00000 days | Status: CP
Start: 2020-03-28 — End: 2020-04-05

## 2020-03-28 MED ADMIN — heparin, porcine (PF) 100 unit/mL injection 200 Units: 200 [IU] | INTRAVENOUS | @ 12:00:00 | Stop: 2020-03-28

## 2020-03-28 NOTE — Unmapped (Addendum)
Instructions:   - A BMT pharmacist will call you if I need to make changes to your tacrolimus dose.   - Your platelets are lower, and your kidney function is worse  - We will not do iv fluids today, but if kidneys are worse on Friday you will have that done.  Please continue to drink lots of fluids.     - We will check for any systemic viral causes of the platelets going down but I think it is probably from the diuretic you are taking so please stop this  - We will switch to zofran from compazine to see if this helps with nausea, prescription is at your pharmacy at home    Return to clinic on Friday to see one of the providers.     Covid:   Given ongoing novel coronavirus (COVID-19), it is strongly recommended to avoid travel and crowds.  If you develop fever, cough, shortness of breath and/or have known exposure (close contact < 3 feet) with someone who has tested positive, please notify us immediately.    ??? Your best defense is to stay home as much as possible and use good hand hygiene.    For prescription refills:   For refills, please check your medication bottles to see if you have additional refills left. If so, please call your pharmacy and follow the directions to request a refill. If you do not have any refills left, please make a request during your clinic visit or by submitting a request through MyChart or by calling [phone]. Please allow 24 hours if your request is made during the week or 48 hours if requests are made on the weekends or holidays.     Labs:   Lab Results   Component Value Date    WBC 4.3 (L) 03/28/2020    HGB 10.8 (L) 03/28/2020    HCT 32.9 (L) 03/28/2020    PLT 99 (L) 03/28/2020     Lab Results   Component Value Date    NA 139 03/28/2020    K 3.6 03/28/2020    CL 106 03/28/2020    CO2 29.0 03/28/2020    BUN 16 03/28/2020    CREATININE 1.32 (H) 03/28/2020    GLU 193 (H) 03/28/2020    CALCIUM 9.1 03/28/2020    MG 1.8 03/28/2020    PHOS 4.1 02/22/2020     Lab Results   Component Value Date    BILITOT 0.3 03/28/2020    BILIDIR 0.30 02/21/2020    PROT 5.5 (L) 03/28/2020    ALBUMIN 3.0 (L) 03/28/2020    ALT 10 03/28/2020    AST 22 03/28/2020    ALKPHOS 68 03/28/2020    GGT 21 01/26/2020     Lab Results   Component Value Date    INR 1.21 02/22/2020    APTT 33.2 02/17/2020       --------------------------------------------------------------------------------------------------------------------  For appointments & questions Monday through Friday 8 AM-4:30 PM     Please call [phone] or Toll free [phone]    On Nights, Weekends and Holidays  Call [phone] and ask for the oncologist on call    Please visit PrivacyFever.cz, a resource created just for family members and caregivers.  This website lists support services, how and where to ask for help. It has tools to assist you as you help [place] care for your loved one.    [facility]  [address]  [place], [place] [address]  [email or web address]

## 2020-03-28 NOTE — Unmapped (Signed)
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  Transplant was tolerated relatively well and primarily complicated by mucositis requiring PCA.    Interval History:   [name] presents today with his fiance for f/u.     He had swelling throughout (face, eyes, feet/legs).  This got a little better as the day went on but was again there on Sunday.  He began an over the conter diuretic and that helped somewhat and today just eyes are a little swollen.     He also feels like the compazine is not helping as much.  He is having nausea primarily after taking his morning pills and this is not every day but 2-3 times a week.     Rash is now present on left upper inner arm in the same distribution as the right.  The right side has completely resolved.      Stools are doing good, about 1-2 normal stools a day.  No other rash and no itching.  Appetite is still not great, but he is finding things that taste good and that is going okay.  No other fevers or infectious symptoms.      ROS:  A comprehensive ROS performed and is negative except for pertinent positives as listed above in interval history.     I reviewed and updated past medical, surgical, social, and family history as appropriate.     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,XY[1]    Variants of Known or Likely Clinical Significance  Gene Transcript  Predicted Protein  VAF (%)   CEBPA c.[id number] p.Ser251Argfs 41      Variants of Unknown Significance  Gene Transcript Predicted Protein VAF (%)   TET2 c.4946A>G p.Tyr1649Cys 49      -  FLT-3-ITD and FLT-3-TKD studies are negative    Pertinent Phenotypic data:    Disease-specific prognostic estimate:     Induction:   7+3+HD Dauno (C1D1 2/24)    Recovery Marrow:    Final Diagnosis   Date Value Ref Range Status   08/12/2019   Final    Bone marrow, right iliac, aspiration and biopsy  -  Normocellular bone marrow (60%) with trilineage hematopoiesis and 4% blasts by manual aspirate differential  -  Routine cytogenetic analysis is pending  -  Flow cytometric MRD analysis is pending    This electronic signature is attestation that the pathologist personally reviewed the submitted material(s) and the final diagnosis reflects that evaluation.            Genetics:   Karyotype/FISH:   RESULTS   Date Value Ref Range Status   07/09/2019   Final    Abnormal Karyotype: 46,Y,t(X;8)(q26;q11.2)[19]/46,XY[1]    Normal FISH:  An interphase FISH assay shows no evidence  of a rearrangement involving the KMT2A (MLL) gene region in the 200 nuclei scored (see below).           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,XY[1]  Variants of Known or Likely Clinical Significance  Gene Transcript  Predicted Protein  VAF (%)   CEBPA c.753_762del p.Ser251Argfs 41      Variants of Unknown Significance  Gene Transcript Predicted Protein VAF (%)   TET2 c.4946A>G p.Tyr1649Cys 49      -  FLT-3-ITD and FLT-3-TKD studies are negative     07/09/2019 Initial Diagnosis    AML (acute myeloid leukemia) (CMS-HCC)     07/14/2019 - 07/20/2019 Chemotherapy    IP LEUKEMIA 7+3 HIGH DOSE DAUNOrubicin  DAUNOrubicin 90 mg/m2 IV on Days 1, 2, 3  Cytarabine 100 mg/m2 CIVI on Days 1 to 7     07/27/2019 Biopsy    Bone marrow, right iliac, aspiration and biopsy  -  Hypocellular bone marrow (<5%) with treatment effect, markedly reduced trilineage hematopoiesis, and 3% blasts by manual aspirate differential (see Comment).     08/24/2019 - 09/28/2019 Chemotherapy    IP LEUKEMIA HIGH DOSE CYTARABINE CONSOLIDATION (3 G/M2)  cytarabine 3 g/m2 every 12 hours on days 1, 3 and 5 every 28 days     08/24/2019 - 12/26/2019 Chemotherapy    IP LEUKEMIA HIGH DOSE CYTARABINE CONSOLIDATION (3 G/M2) ON DAYS 1,2,3  cytarabine 3 g/m2 IV every 12 hours on days 1, 2, 3, every 28 days     01/19/2020 - 01/19/2020 Chemotherapy    BMT OP BUSULFAN TEST DOSE  Busulfan 0.8 mg/kg IV ONCE     01/26/2020 -  Chemotherapy    BMT IP MAC BUSULFAN / FLUDARABINE / rATG (MUD)   Busulfan IV Days -6 to -3  Fludarabine 40 mg/m2 IV Days -6 to -3  rATG 0.5 mg/kg IV Day -3, 1.5 mg/kg IV Day -2, 2.5 mg/kg IV Day -1       Physical exam:Temp:  [36.7 ??C (98.1 ??F)] 36.7 ??C (98.1 ??F)  Heart Rate:  [85] 85  BP: (113)/(77) 113/77   BP 113/77  - Pulse 85  - Temp 36.7 ??C (98.1 ??F) (Temporal)  - Resp 18  - Ht 170.2 cm (5' 7.01)  - Wt 81.7 kg (180 lb 3.2 oz)  - SpO2 97%  - BMI 28.22 kg/m??   70, Cares for self; unable to carry on normal activity or to do active work (ECOG equivalent 1)    General: No acute distress noted.   Central Venous Access: Line c/d/i; No erythema, TTP, or drainage noted.   ENT: Moist mucous membranes. Oropharhynx without lesions, erythema or exudate.   Cardiovascular: Pulse normal rate, regularity and rhythm. S1 and S2 normal, without any murmur, rub, or gallop.  Lungs: Clear to auscultation bilaterally, without wheezes/crackles/rhonchi. Good air movement.   Skin: Few scattered scabs that are tiny with several appearing as linear and thin looking like a scratch but pt is clear these were long thin blisters that are resolving.  Lesions are non painful or itchy.  VZV negative from vesicle swab and blood.  One lesion very small blister on left arm and swabbed today for HSV.  Lesions are entirely healed  on chest and right arm, but now present on left upper inner arm.     Psychiatry: Alert and oriented to person, place, and time.   Gastrointestinal/Abdomen: Normoactive bowel sounds, abdomen soft, non-tender   Musculoskeletal/Extremities: FROM throughout. 1+ edema in LE and minimal around eyes.    Neurologic: CNII-XII intact. Normal strength and sensation throughout    Labs:  I reviewed all labs from today in Epic. See EMR for lab results.    Lab Results   Component Value Date    WBC 4.3 (L) 03/28/2020    HGB 10.8 (L) 03/28/2020    HCT 32.9 (L) 03/28/2020    PLT 99 (L) 03/28/2020     Lab Results   Component Value Date    NA 139 03/28/2020    K 3.6 03/28/2020    CL 106 03/28/2020    CO2 29.0 03/28/2020    BUN 16 03/28/2020    CREATININE 1.32 (H) 03/28/2020    GLU 193 (H) 03/28/2020    CALCIUM 9.1 03/28/2020    MG 1.8 03/28/2020    PHOS 4.1 02/22/2020     Lab Results   Component Value Date    BILITOT 0.3 03/28/2020    BILIDIR 0.30 02/21/2020    PROT 5.5 (L) 03/28/2020    ALBUMIN 3.0 (L) 03/28/2020    ALT 10 03/28/2020    AST 22 03/28/2020    ALKPHOS 68 03/28/2020    GGT 21 01/26/2020     Lab Results   Component Value Date    PT 14.1 (H) 02/22/2020    INR 1.21 02/22/2020    APTT 33.2 02/17/2020     Assessment and Plan    BMT:??AML, CR MRD Negative  HCT-CI (age adjusted) 5??(psych, severe pulmonary dysfunction, and age)   Conditioning:??MAC Bu/Flu/ATG  Donor:??9/10, ABO O+, CMV+  Engraftment:??Granix starting D+12 through engraftment (as defined as ANC 1.0 x 2 days or 3.0 x 1 day)  - Date of last granix injection: 02/19/20    GVHD prophylaxis:??  1.Tacrolimus being managed by pharmacy with a goal of 5-10 ng/mL  2. Methotrexate??15 mg/m2 IVP on day +1 then 10 mg/m2 on days +3, +6 and +11  3. ATG per [facility] standard dosing will??be included  ??  Heme:??  Transfusion criteria:??1 unit of PRBCs for Hgb<7 and 1 unit of platelets for Plt <10K or bleeding.   -??No history of transfusion reactions.     ID:??  Prophylaxis:  - Antiviral: Valtrex 500mg  daily (decreased back to prophy on 11/4)   - Antifungal: Continue Fluconazole 400 mg po daily, continue through day +75  - PJP: Continue Dapsone 100mg  PO daily (has Sulfa allergy).  - CMV D+/R+: Continue Letermovir prophy, 480mg  po daily, Continue through D+100    Vesicular lesions:  - Initial AC fossa lesion 10/25, now scabbed. New sternal vesicular lesions evening 10/29, now scabbed. New vesicular lesions on right lateral forearm and right medial posterior knee, new as of 10/31. Scattered new lesions appearing as much as two weeks later.  Only on right side of body.  Vesicles form in a linear pattern in places, sometimes as long as 5-6cm but very thin  - Discussed treatment with IV acyclovir for disseminated Zoster and patient refused admission until Wednesday 03/22/20, however swabs returned as negative for VZV from vesicle swab and blood.   - Valtrex 1g TID from 11/1-11/3 with small new lesion on thumb.  With negative viral swab and longevity of symptoms less likely infectious cause.   - Possibly  dermatitis?  Pt will monitor and we have now referred to dermatology and sent swab for HSV.  Will recommend starting daily zyrtec.   ??  Neutropenic Fever:  02/03/20: Tmax 38.4 Cefepime (02/03/20 - 02/09/20)  - Cultures NGx 5 days  ??  02/13/20: Spiked again at 5am 9/26  - Cultures negative at 5 days  - Started on IV cefepime (9/26- 10/1) and IV flagyl (anaerobic coverage given mucositis) (9/26-10/1). Received a dose of IV vanc but no indication to continue.   ??  Allergy:  - PCN allergy (hives) has tolerated Cefepime without issues    Seasonal Allergies:  - Flonase and Claritin daily prn.   ??  EBV:  - IgM Ab + 01/05/20 but viral load 01/05/20 not detected.   - Viral Load 10/25, not detected.    Full virals sent 11/9 given mild thrombocytopenia and rash.      GI:??  GERD prophylaxis:  - has now stopped pepcid and doing well off this  ??  Renal:   AKI:   -03/23/20: AKI improved, Scr 1.3 today.    - Continue to push PO fluids.     FEN:   Hyperkalemia: Now resolved  - Stopped oral potassium 10/8 due to high level from AKI  - Potassium continues to be within normal range.     Hypomagnesemia  - Mg 1.8 today, continue Mag Chelate 3 tablets TID.   ??  Hepatic:??  - No active issues.   - VOD prophylaxis with Actigall TID.     CV:    HTN: Likely tac induced  - Now resolved, currently not on any antihypertensives.     Pulm: DLCO 62%  - Former smoker, quit 01/12/20. Using nicotine patch currently. Has chronic dry cough but PFTs were acceptable.   07/24/19: CT chest w/ 0.6cm RUL nodules, no clear etiology.     ** Discussed with Dr. Oswald Hillock prior to transplant, ok to move forward, no plans to repeat unless new findings.       Neuro/Pain:??  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     Psychosocial:??  - Substance abuse (opioid history). Sober for 5 years.  - Followed by Frederik Schmidt in [facility], placed referral for [facility] while admitted, are not seeing inpatients, will follow- up with him post discharge.   - Insomnia: Continue Trazodone 50mg  nightly prn.     ** Not taking melatonin.   - Anxiety: Hospitalization related. Well controlled since discharge.     ** Klonopin 1mg  nightly-Discontinued 03/15/20 as not taking it any more    Bone health:  Dexa scan from 10/13 resulted and shows low bone density.   - Seen by pharmacy and discussed zometa and calcium supplemenation 10/22  - Zometa first dose on 03/23/20    ** He has a dental appointment Monday 11/1 but just for impressions for partial implant (it is a non-invasive dental procedure as the implants are clip on with a retainer, not surgically attached, so ok to proceed with Zometa)   ??  Summary:   - Doing well overall with no evidence of GVHD or infection.  Unusual rash not consistent with shingles, appears to be some sort of contact dermatitis.  Swabbed today for HSV and will try zyrtec and referred to dermatology.   -  Edema: Possibly reaction to zometa infusion last week  - AKI: Stop diuretic and repeat labs on Friday.  If worsened have requested infusion appt for fluids.    Hallie Ertl Elie Confer, [facility]  Physician Assistant  Adult Bone Marrow Transplantation    I personally spent 75 minutes face-to-face and non-face-to-face in the care of this patient, which includes all pre, intra, and post visit time on the date of service.

## 2020-03-29 LAB — CMV DNA, QUANTITATIVE, PCR: CMV VIRAL LD: NOT DETECTED

## 2020-03-29 NOTE — Unmapped (Signed)
Bone Marrow Transplant and Cellular Therapy Program  Immunosuppressive Therapy Note    [name] is a 59 y.o. 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 [name] is currently day +55.    Current dose: 1 mg PO BID (dose decreased on 10/13)    Goal tacrolimus Level: 5-10 ng/mL    Resulted level: 10.4 ng/mL (drawn at 0715)    Lab Results   Component Value Date/Time    TACROLIMUS 10.4 03/28/2020 07:15 AM    TACROLIMUS 8.4 03/23/2020 09:35 AM    TACROLIMUS 7.4 03/20/2020 09:34 AM     Lab Results   Component Value Date/Time    CREATININE 1.32 (H) 03/28/2020 07:15 AM    CREATININE 1.14 03/23/2020 09:35 AM    CREATININE 1.09 03/20/2020 09:34 AM     Tacrolimus level today is slightly supratherapeutic. Level was drawn ~2 hours before normal dosing time so true level likely within the upper end of goal range. Renal function is significantly elevated at 1.32 (up from 1.14 on 11/4). Given above with elevated Scr and previous tacrolimus levels trending up, will plan to decrease tacrolimus dose to 1 mg in the AM and 0.5 mg in the PM and recheck tacrolimus level and renal function at next clinic visit. Left patient a voicemail regarding dose reduction.    We will continue to monitor levels. Patient will be followed for changes in renal and hepatic function, toxicity, and efficacy.     Chilton Greathouse, PharmD  PGY-2 Hematology/Oncology Pharmacy Resident

## 2020-03-30 DIAGNOSIS — Z9484 Stem cells transplant status: Principal | ICD-10-CM

## 2020-03-30 LAB — ADENOVIRUS PCR BLOOD: ADENOVIRUS PCR, BLOOD: NEGATIVE

## 2020-03-30 LAB — HHV-6 PCR, BLOOD: HHV6 PCR, BLOOD: NEGATIVE

## 2020-03-30 LAB — EBV QUANTITATIVE PCR, BLOOD: EBV VIRAL LOAD RESULT: NOT DETECTED

## 2020-03-31 ENCOUNTER — Encounter: Admit: 2020-03-31 | Discharge: 2020-04-01 | Payer: PRIVATE HEALTH INSURANCE

## 2020-03-31 ENCOUNTER — Ambulatory Visit: Admit: 2020-03-31 | Discharge: 2020-04-01 | Payer: PRIVATE HEALTH INSURANCE

## 2020-03-31 DIAGNOSIS — C9201 Acute myeloblastic leukemia, in remission: Principal | ICD-10-CM

## 2020-03-31 DIAGNOSIS — Z9484 Stem cells transplant status: Principal | ICD-10-CM

## 2020-03-31 LAB — CBC W/ AUTO DIFF
BASOPHILS ABSOLUTE COUNT: 0 10*9/L (ref 0.0–0.1)
BASOPHILS RELATIVE PERCENT: 0.6 %
EOSINOPHILS ABSOLUTE COUNT: 0.2 10*9/L (ref 0.0–0.4)
EOSINOPHILS RELATIVE PERCENT: 4.4 %
HEMATOCRIT: 35.3 % — ABNORMAL LOW (ref 41.0–53.0)
HEMOGLOBIN: 11.4 g/dL — ABNORMAL LOW (ref 13.5–17.5)
LARGE UNSTAINED CELLS: 5 % — ABNORMAL HIGH (ref 0–4)
LYMPHOCYTES ABSOLUTE COUNT: 0.7 10*9/L — ABNORMAL LOW (ref 1.5–5.0)
LYMPHOCYTES RELATIVE PERCENT: 13.1 %
MEAN CORPUSCULAR HEMOGLOBIN CONC: 32.3 g/dL (ref 31.0–37.0)
MEAN CORPUSCULAR HEMOGLOBIN: 37.7 pg — ABNORMAL HIGH (ref 26.0–34.0)
MEAN CORPUSCULAR VOLUME: 116.6 fL — ABNORMAL HIGH (ref 80.0–100.0)
MEAN PLATELET VOLUME: 11 fL — ABNORMAL HIGH (ref 7.0–10.0)
MONOCYTES ABSOLUTE COUNT: 0.5 10*9/L (ref 0.2–0.8)
MONOCYTES RELATIVE PERCENT: 9.2 %
NEUTROPHILS ABSOLUTE COUNT: 3.7 10*9/L (ref 2.0–7.5)
NEUTROPHILS RELATIVE PERCENT: 68.3 %
PLATELET COUNT: 75 10*9/L — ABNORMAL LOW (ref 150–440)
RED BLOOD CELL COUNT: 3.03 10*12/L — ABNORMAL LOW (ref 4.50–5.90)
RED CELL DISTRIBUTION WIDTH: 16.4 % — ABNORMAL HIGH (ref 12.0–15.0)
WBC ADJUSTED: 5.5 10*9/L (ref 4.5–11.0)

## 2020-03-31 LAB — COMPREHENSIVE METABOLIC PANEL
ALBUMIN: 2.9 g/dL — ABNORMAL LOW (ref 3.4–5.0)
ALKALINE PHOSPHATASE: 71 U/L (ref 46–116)
ALT (SGPT): <7 U/L — ABNORMAL LOW (ref 10–49)
ANION GAP: 7 mmol/L (ref 5–14)
AST (SGOT): 19 U/L (ref <=34)
BILIRUBIN TOTAL: 0.3 mg/dL (ref 0.3–1.2)
BLOOD UREA NITROGEN: 15 mg/dL (ref 9–23)
BUN / CREAT RATIO: 9
CALCIUM: 8.9 mg/dL (ref 8.7–10.4)
CHLORIDE: 104 mmol/L (ref 98–107)
CO2: 28 mmol/L (ref 20.0–31.0)
CREATININE: 1.61 mg/dL — ABNORMAL HIGH
EGFR CKD-EPI AA MALE: 54 mL/min/{1.73_m2} — ABNORMAL LOW (ref >=60)
EGFR CKD-EPI NON-AA MALE: 47 mL/min/{1.73_m2} — ABNORMAL LOW (ref >=60)
GLUCOSE RANDOM: 147 mg/dL (ref 70–179)
POTASSIUM: 3.8 mmol/L (ref 3.4–4.5)
PROTEIN TOTAL: 5.6 g/dL — ABNORMAL LOW (ref 5.7–8.2)
SODIUM: 139 mmol/L (ref 135–145)

## 2020-03-31 LAB — SLIDE REVIEW

## 2020-03-31 LAB — MAGNESIUM: MAGNESIUM: 1.8 mg/dL (ref 1.6–2.6)

## 2020-03-31 LAB — TACROLIMUS LEVEL, TROUGH: TACROLIMUS, TROUGH: 11.9 ng/mL (ref 5.0–15.0)

## 2020-03-31 MED ADMIN — heparin, porcine (PF) 100 unit/mL injection 200 Units: 200 [IU] | INTRAVENOUS | @ 14:00:00 | Stop: 2020-03-31

## 2020-03-31 NOTE — Unmapped (Signed)
Hgb 11.4, Hematocrit 35.3, Platelet count 75.   K 3.8, Mg 1.8. No transfusion or replacement indicated per orders.   Pt is discharged from clinic in NAD, in stable condition, accompanied by family.

## 2020-04-04 ENCOUNTER — Encounter
Admit: 2020-04-04 | Discharge: 2020-04-05 | Payer: PRIVATE HEALTH INSURANCE | Attending: Psychiatry | Primary: Psychiatry

## 2020-04-04 ENCOUNTER — Encounter: Admit: 2020-04-04 | Discharge: 2020-04-04 | Payer: PRIVATE HEALTH INSURANCE

## 2020-04-04 ENCOUNTER — Other Ambulatory Visit: Admit: 2020-04-04 | Discharge: 2020-04-04 | Payer: PRIVATE HEALTH INSURANCE

## 2020-04-04 DIAGNOSIS — Z9484 Stem cells transplant status: Principal | ICD-10-CM

## 2020-04-04 DIAGNOSIS — F1111 Opioid abuse, in remission: Principal | ICD-10-CM

## 2020-04-04 DIAGNOSIS — R630 Anorexia: Principal | ICD-10-CM

## 2020-04-04 DIAGNOSIS — R601 Generalized edema: Principal | ICD-10-CM

## 2020-04-04 DIAGNOSIS — Z5181 Encounter for therapeutic drug level monitoring: Principal | ICD-10-CM

## 2020-04-04 DIAGNOSIS — C9201 Acute myeloblastic leukemia, in remission: Principal | ICD-10-CM

## 2020-04-04 DIAGNOSIS — F419 Anxiety disorder, unspecified: Principal | ICD-10-CM

## 2020-04-04 DIAGNOSIS — N179 Acute kidney failure, unspecified: Principal | ICD-10-CM

## 2020-04-04 LAB — COMPREHENSIVE METABOLIC PANEL
ALBUMIN: 3.2 g/dL — ABNORMAL LOW (ref 3.4–5.0)
ALKALINE PHOSPHATASE: 68 U/L (ref 46–116)
ALT (SGPT): 9 U/L — ABNORMAL LOW (ref 10–49)
ANION GAP: 2 mmol/L — ABNORMAL LOW (ref 5–14)
AST (SGOT): 23 U/L (ref <=34)
BILIRUBIN TOTAL: 0.5 mg/dL (ref 0.3–1.2)
BLOOD UREA NITROGEN: 23 mg/dL (ref 9–23)
BUN / CREAT RATIO: 11
CALCIUM: 9.8 mg/dL (ref 8.7–10.4)
CHLORIDE: 106 mmol/L (ref 98–107)
CO2: 31 mmol/L (ref 20.0–31.0)
CREATININE: 2.01 mg/dL — ABNORMAL HIGH
EGFR CKD-EPI AA MALE: 41 mL/min/{1.73_m2} — ABNORMAL LOW (ref >=60)
EGFR CKD-EPI NON-AA MALE: 36 mL/min/{1.73_m2} — ABNORMAL LOW (ref >=60)
GLUCOSE RANDOM: 88 mg/dL (ref 70–179)
POTASSIUM: 4.6 mmol/L — ABNORMAL HIGH (ref 3.4–4.5)
PROTEIN TOTAL: 5.7 g/dL (ref 5.7–8.2)
SODIUM: 139 mmol/L (ref 135–145)

## 2020-04-04 LAB — CBC W/ AUTO DIFF
BASOPHILS ABSOLUTE COUNT: 0 10*9/L (ref 0.0–0.1)
BASOPHILS RELATIVE PERCENT: 0.3 %
EOSINOPHILS ABSOLUTE COUNT: 0.2 10*9/L (ref 0.0–0.4)
EOSINOPHILS RELATIVE PERCENT: 3.4 %
HEMATOCRIT: 34.1 % — ABNORMAL LOW (ref 41.0–53.0)
HEMOGLOBIN: 11 g/dL — ABNORMAL LOW (ref 13.5–17.5)
LARGE UNSTAINED CELLS: 5 % — ABNORMAL HIGH (ref 0–4)
LYMPHOCYTES ABSOLUTE COUNT: 0.7 10*9/L — ABNORMAL LOW (ref 1.5–5.0)
LYMPHOCYTES RELATIVE PERCENT: 16.3 %
MEAN CORPUSCULAR HEMOGLOBIN CONC: 32.3 g/dL (ref 31.0–37.0)
MEAN CORPUSCULAR HEMOGLOBIN: 37.4 pg — ABNORMAL HIGH (ref 26.0–34.0)
MEAN CORPUSCULAR VOLUME: 116 fL — ABNORMAL HIGH (ref 80.0–100.0)
MEAN PLATELET VOLUME: 12 fL — ABNORMAL HIGH (ref 7.0–10.0)
MONOCYTES ABSOLUTE COUNT: 0.4 10*9/L (ref 0.2–0.8)
MONOCYTES RELATIVE PERCENT: 9.5 %
NEUTROPHILS ABSOLUTE COUNT: 2.8 10*9/L (ref 2.0–7.5)
NEUTROPHILS RELATIVE PERCENT: 65.5 %
PLATELET COUNT: 58 10*9/L — ABNORMAL LOW (ref 150–440)
RED BLOOD CELL COUNT: 2.94 10*12/L — ABNORMAL LOW (ref 4.50–5.90)
RED CELL DISTRIBUTION WIDTH: 15.3 % — ABNORMAL HIGH (ref 12.0–15.0)
WBC ADJUSTED: 4.3 10*9/L — ABNORMAL LOW (ref 4.5–11.0)

## 2020-04-04 LAB — CMV DNA, QUANTITATIVE, PCR: CMV VIRAL LD: NOT DETECTED

## 2020-04-04 LAB — MAGNESIUM: MAGNESIUM: 2.3 mg/dL (ref 1.6–2.6)

## 2020-04-04 LAB — SLIDE REVIEW

## 2020-04-04 MED ORDER — MG-PLUS-PROTEIN 133 MG TABLET
ORAL_TABLET | Freq: Three times a day (TID) | ORAL | 3 refills | 90 days | Status: CP
Start: 2020-04-04 — End: 2020-04-07

## 2020-04-04 MED ADMIN — heparin, porcine (PF) 100 unit/mL injection 200 Units: 200 [IU] | INTRAVENOUS | @ 18:00:00 | Stop: 2020-04-04

## 2020-04-04 NOTE — Unmapped (Addendum)
Hold dapsone.  Hold tacrolimus till we call you.  Stop fluid pills  Decrease mag to 1 TID.     All lab results last 24 hours:    Recent Results (from the past 24 hour(s))   Comprehensive Metabolic Panel    Collection Time: 04/04/20 12:54 PM   Result Value Ref Range    Sodium 139 135 - 145 mmol/L    Potassium 4.6 (H) 3.4 - 4.5 mmol/L    Chloride 106 98 - 107 mmol/L    Anion Gap 2 (L) 5 - 14 mmol/L    CO2 31.0 20.0 - 31.0 mmol/L    BUN 23 9 - 23 mg/dL    Creatinine 1.61 (H) 0.60 - 1.10 mg/dL    BUN/Creatinine Ratio 11     EGFR CKD-EPI Non-African American, Male 36 (L) >=60 mL/min/1.15m2    EGFR CKD-EPI African American, Male 41 (L) >=60 mL/min/1.43m2    Glucose 88 70 - 179 mg/dL    Calcium 9.8 8.7 - 09.6 mg/dL    Albumin 3.2 (L) 3.4 - 5.0 g/dL    Total Protein 5.7 5.7 - 8.2 g/dL    Total Bilirubin 0.5 0.3 - 1.2 mg/dL    AST 23 <=04 U/L    ALT 9 (L) 10 - 49 U/L    Alkaline Phosphatase 68 46 - 116 U/L   Magnesium Level    Collection Time: 04/04/20 12:54 PM   Result Value Ref Range    Magnesium 2.3 1.6 - 2.6 mg/dL   CBC w/ Differential    Collection Time: 04/04/20 12:54 PM   Result Value Ref Range    WBC 4.3 (L) 4.5 - 11.0 10*9/L    RBC 2.94 (L) 4.50 - 5.90 10*12/L    HGB 11.0 (L) 13.5 - 17.5 g/dL    HCT 54.0 (L) 98.1 - 53.0 %    MCV 116.0 (H) 80.0 - 100.0 fL    MCH 37.4 (H) 26.0 - 34.0 pg    MCHC 32.3 31.0 - 37.0 g/dL    RDW 19.1 (H) 47.8 - 15.0 %    MPV 12.0 (H) 7.0 - 10.0 fL    Platelet 58 (L) 150 - 440 10*9/L    Neutrophils % 65.5 %    Lymphocytes % 16.3 %    Monocytes % 9.5 %    Eosinophils % 3.4 %    Basophils % 0.3 %    Absolute Neutrophils 2.8 2.0 - 7.5 10*9/L    Absolute Lymphocytes 0.7 (L) 1.5 - 5.0 10*9/L    Absolute Monocytes 0.4 0.2 - 0.8 10*9/L    Absolute Eosinophils 0.2 0.0 - 0.4 10*9/L    Absolute Basophils 0.0 0.0 - 0.1 10*9/L    Large Unstained Cells 5 (H) 0 - 4 %    Macrocytosis Marked (A) Not Present    Hypochromasia Moderate (A) Not Present

## 2020-04-04 NOTE — Unmapped (Signed)
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  Transplant was tolerated relatively well and primarily complicated by mucositis requiring PCA.    Interval History:   [name] presents today with his fiance for f/u.     Continues with swelling to face and legs. He has been taking OTC diuretics with caffeine.  He thinks it has helped.    He has not had weight gain. Today he has trace edema to the legs.  He reports that legs can get big and making walking a bit awkward.    He has has mild nausea.  No vomiting. No c/o diarrhea. He is eating fair.  He does not feel hungry and foods do not taste good so those are barriers to eating more.      He has not had any more skin issues.      He reports drinking well.      ROS:  A comprehensive ROS performed and is negative except for pertinent positives as listed above in interval history.     I reviewed and updated past medical, surgical, social, and family history as appropriate.     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,XY[1]    Variants of Known or Likely Clinical Significance  Gene Transcript  Predicted Protein  VAF (%)   CEBPA c.[id number] p.Ser251Argfs 41      Variants of Unknown Significance  Gene Transcript Predicted Protein VAF (%)   TET2 c.4946A>G p.Tyr1649Cys 49      -  FLT-3-ITD and FLT-3-TKD studies are negative    Pertinent Phenotypic data:    Disease-specific prognostic estimate:     Induction:   7+3+HD Dauno (C1D1 2/24)    Recovery Marrow:    Final Diagnosis   Date Value Ref Range Status   08/12/2019   Final    Bone marrow, right iliac, aspiration and biopsy  -  Normocellular bone marrow (60%) with trilineage hematopoiesis and 4% blasts by manual aspirate differential  -  Routine cytogenetic analysis is pending  -  Flow cytometric MRD analysis is pending    This electronic signature is attestation that the pathologist personally reviewed the submitted material(s) and the final diagnosis reflects that evaluation.            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           Molecular Genetics: No results found for: MYELOIDMP       AML (acute myeloid leukemia) (CMS-HCC)  07/09/2019 Biopsy    Bone marrow, left iliac, aspiration and biopsy  -  Hypercellular bone marrow (95%) involved by acute myeloid leukemia (42% blasts by manual aspirate differential)   Abnormal Karyotype: 46,Y,t(X;8)(q26;q11.2)[19]/46,XY[1]  Variants of Known or Likely Clinical Significance  Gene Transcript  Predicted Protein  VAF (%)   CEBPA c.753_762del p.Ser251Argfs 41      Variants of Unknown Significance  Gene Transcript Predicted Protein VAF (%)   TET2 c.4946A>G p.Tyr1649Cys 49      -  FLT-3-ITD and FLT-3-TKD studies are negative     07/09/2019 Initial Diagnosis    AML (acute myeloid leukemia) (CMS-HCC)     07/14/2019 - 07/20/2019 Chemotherapy    IP LEUKEMIA 7+3 HIGH DOSE DAUNOrubicin  DAUNOrubicin 90 mg/m2 IV on Days 1, 2, 3  Cytarabine 100 mg/m2 CIVI on Days 1 to 7     07/27/2019 Biopsy    Bone marrow, right iliac, aspiration and biopsy  -  Hypocellular bone marrow (<5%) with treatment effect, markedly reduced trilineage hematopoiesis, and 3% blasts by manual aspirate differential (see Comment).     08/24/2019 - 09/28/2019 Chemotherapy    IP LEUKEMIA HIGH DOSE CYTARABINE CONSOLIDATION (3 G/M2)  cytarabine 3 g/m2 every 12 hours on days 1, 3 and 5 every 28 days     08/24/2019 - 12/26/2019 Chemotherapy    IP LEUKEMIA HIGH DOSE CYTARABINE CONSOLIDATION (3 G/M2) ON DAYS 1,2,3  cytarabine 3 g/m2 IV every 12 hours on days 1, 2, 3, every 28 days     01/19/2020 - 01/19/2020 Chemotherapy    BMT OP BUSULFAN TEST DOSE  Busulfan 0.8 mg/kg IV ONCE     01/26/2020 -  Chemotherapy    BMT IP MAC BUSULFAN / FLUDARABINE / rATG (MUD)   Busulfan IV Days -6 to -3  Fludarabine 40 mg/m2 IV Days -6 to -3  rATG 0.5 mg/kg IV Day -3, 1.5 mg/kg IV Day -2, 2.5 mg/kg IV Day -1       Physical exam:    There were no vitals taken for this visit.  72, Cares for self; unable to carry on normal activity or to do active work (ECOG equivalent 1)    General: No acute distress noted.   Central Venous Access: Line c/d/i; No erythema, TTP, or drainage noted.   ENT: Moist mucous membranes. Oropharhynx without lesions, erythema or exudate.   Cardiovascular: Pulse normal rate, regularity and rhythm. S1 and S2 normal, without any murmur, rub, or gallop.  Lungs: Clear to auscultation bilaterally, without wheezes/crackles/rhonchi. Good air movement.   Skin: No new lesions. Old areas are healed and appear pink  Psychiatry: Alert and oriented to person, place, and time.   Gastrointestinal/Abdomen: Normoactive bowel sounds, abdomen soft, non-tender   Musculoskeletal/Extremities: FROM throughout. Trace leg edema and minimal around eyes.    Neurologic: CNII-XII intact. Normal strength and sensation throughout    Labs:  I reviewed all labs from today in Epic. See EMR for lab results.    Lab Results   Component Value Date    WBC 5.5 03/31/2020    HGB 11.4 (L) 03/31/2020    HCT 35.3 (L) 03/31/2020    PLT 75 (L) 03/31/2020     Lab Results   Component Value Date    NA 139 03/31/2020    K 3.8 03/31/2020    CL 104 03/31/2020    CO2 28.0 03/31/2020    BUN 15 03/31/2020    CREATININE 1.61 (H) 03/31/2020    GLU 147 03/31/2020  CALCIUM 8.9 03/31/2020    MG 1.8 03/31/2020    PHOS 4.1 02/22/2020     Lab Results   Component Value Date    BILITOT 0.3 03/31/2020    BILIDIR 0.30 02/21/2020    PROT 5.6 (L) 03/31/2020    ALBUMIN 2.9 (L) 03/31/2020    ALT <7 (L) 03/31/2020    AST 19 03/31/2020    ALKPHOS 71 03/31/2020    GGT 21 01/26/2020     Lab Results   Component Value Date    PT 14.1 (H) 02/22/2020    INR 1.21 02/22/2020    APTT 33.2 02/17/2020     Assessment and Plan    BMT:??AML, CR MRD Negative  HCT-CI (age adjusted) 7??(psych, severe pulmonary dysfunction, and age)   Conditioning:??MAC Bu/Flu/ATG  Donor:??9/10, ABO O+, CMV+  Engraftment:??Granix starting D+12 through engraftment (as defined as ANC 1.0 x 2 days or 3.0 x 1 day)  - Date of last granix injection: 02/19/20    GVHD prophylaxis:??  1.Tacrolimus being managed by pharmacy with a goal of 5-10 ng/mL  2. Methotrexate??15 mg/m2 IVP on day +1 then 10 mg/m2 on days +3, +6 and +11  3. ATG per [facility] standard dosing will??be included  ??  Heme:??  Transfusion criteria:??1 unit of PRBCs for Hgb<7 and 1 unit of platelets for Plt <10K or bleeding.   -??No history of transfusion reactions.     ID:??  Prophylaxis:  - Antiviral: Valtrex 500mg  daily (decreased back to prophy on 11/4)   - Antifungal: Continue Fluconazole 400 mg po daily, continue through day +75  - PJP: Continue Dapsone 100mg  PO daily (has Sulfa allergy)-  Put on hold 11/17 due to decreasing counts  - CMV D+/R+: Continue Letermovir prophy, 480mg  po daily, Continue through D+100    Vesicular lesions:  - Initial AC fossa lesion 10/25, now scabbed. New sternal vesicular lesions evening 10/29, now scabbed. New vesicular lesions on right lateral forearm and right medial posterior knee, new as of 10/31. Scattered new lesions appearing as much as two weeks later.  Only on right side of body. Vesicles form in a linear pattern in places, sometimes as long as 5-6cm but very thin  - Discussed treatment with IV acyclovir for disseminated Zoster and patient refused admission until Wednesday 03/22/20, however swabs returned as negative for VZV from vesicle swab and blood.   - Valtrex 1g TID from 11/1-11/3 with small new lesion on thumb.  With negative viral swab and longevity of symptoms less likely infectious cause.   - Possibly dermatitis?  Pt will monitor and we have now referred to dermatology and sent swab for HSV.  Will recommend starting daily zyrtec.   - No new lesions 11/16  Old lesions are healing.   ??  Neutropenic Fever:  02/03/20: Tmax 38.4 Cefepime (02/03/20 - 02/09/20)  - Cultures NGx 5 days  ??  02/13/20: Spiked again at 5am 9/26  - Cultures negative at 5 days  - Started on IV cefepime (9/26- 10/1) and IV flagyl (anaerobic coverage given mucositis) (9/26-10/1). Received a dose of IV vanc but no indication to continue.   ??  Allergy:  - PCN allergy (hives) has tolerated Cefepime without issues    Seasonal Allergies:  - Flonase and Claritin daily prn.   ??  EBV:  - IgM Ab + 01/05/20 but viral load 01/05/20 not detected.   - Viral Load 10/25, not detected.    Full virals sent 11/9 given mild thrombocytopenia and rash this panel  was negative.    GI:??  GERD prophylaxis:  - has now stopped pepcid and doing well off this  ??  Renal:   AKI:   -04/05/20: AKI  Worse and up to 2 today.    He needs to hold OTC diuretics  We are going to hold tac as this level was high but not dose adjusted last week.   We are going to re eval later in the week.  I did not give fluids due to general edema. I imagine he will just 3rd space what we give but may have to do fluids if cr is worse later in the week.  He is a good ways off from baseline.      FEN:   Hyperkalemia:  Currently <5        Hypomagnesemia  - Mg 2.3 today, Decrease Mag Chelate 1 tablets TID.   ??  Hepatic:??  - No active issues.   - VOD prophylaxis with Actigall TID. CV:    HTN: Likely tac induced  - Now resolved, currently not on any antihypertensives.     Pulm: DLCO 62%  - Former smoker, quit 01/12/20. Using nicotine patch currently. Has chronic dry cough but PFTs were acceptable.   07/24/19: CT chest w/ 0.6cm RUL nodules, no clear etiology.     ** Discussed with Dr. Oswald Hillock prior to transplant, ok to move forward, no plans to repeat unless new findings.       Neuro/Pain:??  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     Psychosocial:??  - Substance abuse (opioid history). Sober for 5 years.  - Followed by Frederik Schmidt in [facility], placed referral for [facility] while admitted, are not seeing inpatients, will follow- up with him post discharge.   - Insomnia: Continue Trazodone 50mg  nightly prn.     ** Not taking melatonin.   - Anxiety: Hospitalization related. Well controlled since discharge.     ** Klonopin 1mg  nightly-Discontinued 03/15/20 as not taking it any more    Bone health:  Dexa scan from 10/13 resulted and shows low bone density.   - Seen by pharmacy and discussed zometa and calcium supplemenation 10/22  - Zometa first dose on 03/23/20    ** He has a dental appointment Monday 11/1 but just for impressions for partial implant (it is a non-invasive dental procedure as the implants are clip on with a retainer, not surgically attached, so ok to proceed with Zometa)   ??  Summary:   Unclear of causes of edema  I would like to stop all non essentials and slowly re-introudce meds.  For now we will hold Dapsone.  He is coming back on Friday.           Westley Hummer ANP-BC, AOCNP  [facility]    I personally spent 50 minutes face-to-face and non-face-to-face in the care of this patient, which includes all pre, intra, and post visit time on the date of service.

## 2020-04-05 LAB — TACROLIMUS LEVEL, TROUGH: TACROLIMUS, TROUGH: 9 ng/mL (ref 5.0–15.0)

## 2020-04-05 MED ORDER — TACROLIMUS 0.5 MG CAPSULE, IMMEDIATE-RELEASE
ORAL_CAPSULE | Freq: Every day | ORAL | 5 refills | 30 days | Status: CP
Start: 2020-04-05 — End: 2020-04-07

## 2020-04-05 NOTE — Unmapped (Signed)
Bone Marrow Transplant and Cellular Therapy Program  Immunosuppressive Therapy Note    [name] is a 59 y.o. 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 [name] is currently day +63 (as of 11/17).    Current dose: tacrolimus 1 mg qAM and 0.5 mg qPM (dose decreased on 11/9)    Goal tacrolimus Level: 5-10 ng/mL    Resulted level: 9 ng/mL (drawn on 11/16, resulted on 11/17)    Lab Results   Component Value Date/Time    TACROLIMUS 9.0 04/04/2020 12:54 PM    TACROLIMUS 11.9 03/31/2020 09:25 AM    TACROLIMUS 10.4 03/28/2020 07:15 AM     Lab Results   Component Value Date/Time    CREATININE 2.01 (H) 04/04/2020 12:54 PM    CREATININE 1.61 (H) 03/31/2020 09:25 AM    CREATININE 1.32 (H) 03/28/2020 07:15 AM     Tacrolimus level is therapeutic, however, given level was drawn ~4hrs after normal dosing time true trough is > 10 ng/mL and supratherapeutic. Renal function has worsened significantly over the last few days. LFTs are WNL. [name] was instructed to HOLD PM dose on 11/16 and AM dose on 11/17. Will have him resume reduced dose today (11/17) of tacrolimus 0.5 mg once daily. Will recheck tacrolimus level and renal function at next clinic visit. Left vm for [name] with the above instructions and to call clinic if any questions.    We will continue to monitor levels.  Patient will be followed for changes in renal and hepatic function, toxicity, and efficacy.     Dallas Schimke, PharmD, CPP  BMT Clinical Pharmacist Practitioner

## 2020-04-06 NOTE — Unmapped (Signed)
Clinical Assessment Needed For: Dose Change  Medication: Tacrolimus 0.5mg  capsules  Last Fill Date/Day Supply: 02/23/20 / 30 days  Copay $0  Was previous dose already scheduled to fill: No    Notes to Pharmacist: Dose decrease

## 2020-04-07 ENCOUNTER — Other Ambulatory Visit: Admit: 2020-04-07 | Discharge: 2020-04-07 | Payer: PRIVATE HEALTH INSURANCE

## 2020-04-07 ENCOUNTER — Encounter: Admit: 2020-04-07 | Discharge: 2020-04-07 | Payer: PRIVATE HEALTH INSURANCE

## 2020-04-07 DIAGNOSIS — Z9484 Stem cells transplant status: Principal | ICD-10-CM

## 2020-04-07 DIAGNOSIS — C9200 Acute myeloblastic leukemia, not having achieved remission: Principal | ICD-10-CM

## 2020-04-07 DIAGNOSIS — R600 Localized edema: Principal | ICD-10-CM

## 2020-04-07 DIAGNOSIS — I10 Essential (primary) hypertension: Principal | ICD-10-CM

## 2020-04-07 DIAGNOSIS — G5793 Unspecified mononeuropathy of bilateral lower limbs: Principal | ICD-10-CM

## 2020-04-07 DIAGNOSIS — Z880 Allergy status to penicillin: Principal | ICD-10-CM

## 2020-04-07 DIAGNOSIS — Z9481 Bone marrow transplant status: Principal | ICD-10-CM

## 2020-04-07 DIAGNOSIS — C9201 Acute myeloblastic leukemia, in remission: Principal | ICD-10-CM

## 2020-04-07 LAB — CBC W/ AUTO DIFF
BASOPHILS ABSOLUTE COUNT: 0 10*9/L (ref 0.0–0.1)
BASOPHILS RELATIVE PERCENT: 0.3 %
EOSINOPHILS ABSOLUTE COUNT: 0.3 10*9/L (ref 0.0–0.4)
EOSINOPHILS RELATIVE PERCENT: 5.1 %
HEMATOCRIT: 34.4 % — ABNORMAL LOW (ref 41.0–53.0)
HEMOGLOBIN: 10.8 g/dL — ABNORMAL LOW (ref 13.5–17.5)
LARGE UNSTAINED CELLS: 4 % (ref 0–4)
LYMPHOCYTES ABSOLUTE COUNT: 0.5 10*9/L — ABNORMAL LOW (ref 1.5–5.0)
LYMPHOCYTES RELATIVE PERCENT: 10 %
MEAN CORPUSCULAR HEMOGLOBIN CONC: 31.5 g/dL (ref 31.0–37.0)
MEAN CORPUSCULAR HEMOGLOBIN: 37 pg — ABNORMAL HIGH (ref 26.0–34.0)
MEAN CORPUSCULAR VOLUME: 117.3 fL — ABNORMAL HIGH (ref 80.0–100.0)
MEAN PLATELET VOLUME: 12.1 fL — ABNORMAL HIGH (ref 7.0–10.0)
MONOCYTES ABSOLUTE COUNT: 0.4 10*9/L (ref 0.2–0.8)
MONOCYTES RELATIVE PERCENT: 7.7 %
NEUTROPHILS ABSOLUTE COUNT: 3.7 10*9/L (ref 2.0–7.5)
NEUTROPHILS RELATIVE PERCENT: 72.8 %
PLATELET COUNT: 48 10*9/L — ABNORMAL LOW (ref 150–440)
RED BLOOD CELL COUNT: 2.93 10*12/L — ABNORMAL LOW (ref 4.50–5.90)
RED CELL DISTRIBUTION WIDTH: 15 % (ref 12.0–15.0)
WBC ADJUSTED: 5.1 10*9/L (ref 4.5–11.0)

## 2020-04-07 LAB — COMPREHENSIVE METABOLIC PANEL
ALBUMIN: 3.1 g/dL — ABNORMAL LOW (ref 3.4–5.0)
ALKALINE PHOSPHATASE: 71 U/L (ref 46–116)
ALT (SGPT): 8 U/L — ABNORMAL LOW (ref 10–49)
ANION GAP: 3 mmol/L — ABNORMAL LOW (ref 5–14)
AST (SGOT): 21 U/L (ref <=34)
BILIRUBIN TOTAL: 0.3 mg/dL (ref 0.3–1.2)
BLOOD UREA NITROGEN: 27 mg/dL — ABNORMAL HIGH (ref 9–23)
BUN / CREAT RATIO: 16
CALCIUM: 9.4 mg/dL (ref 8.7–10.4)
CHLORIDE: 107 mmol/L (ref 98–107)
CO2: 30 mmol/L (ref 20.0–31.0)
CREATININE: 1.72 mg/dL — ABNORMAL HIGH
EGFR CKD-EPI AA MALE: 50 mL/min/{1.73_m2} — ABNORMAL LOW (ref >=60)
EGFR CKD-EPI NON-AA MALE: 43 mL/min/{1.73_m2} — ABNORMAL LOW (ref >=60)
GLUCOSE RANDOM: 142 mg/dL (ref 70–179)
POTASSIUM: 4.2 mmol/L (ref 3.4–4.5)
PROTEIN TOTAL: 5.5 g/dL — ABNORMAL LOW (ref 5.7–8.2)
SODIUM: 140 mmol/L (ref 135–145)

## 2020-04-07 LAB — TACROLIMUS LEVEL, TROUGH: TACROLIMUS, TROUGH: 4.1 ng/mL — ABNORMAL LOW (ref 5.0–15.0)

## 2020-04-07 LAB — MAGNESIUM: MAGNESIUM: 2.1 mg/dL (ref 1.6–2.6)

## 2020-04-07 LAB — C-REACTIVE PROTEIN: C-REACTIVE PROTEIN: 11 mg/L — ABNORMAL HIGH (ref <=10.0)

## 2020-04-07 MED ORDER — TACROLIMUS 0.5 MG CAPSULE, IMMEDIATE-RELEASE
ORAL_CAPSULE | Freq: Two times a day (BID) | ORAL | 5 refills | 30.00000 days | Status: CP
Start: 2020-04-07 — End: 2020-04-11

## 2020-04-07 MED ORDER — LORATADINE 10 MG TABLET
ORAL_TABLET | Freq: Every day | ORAL | 2 refills | 30 days
Start: 2020-04-07 — End: 2021-04-07

## 2020-04-07 MED ORDER — MG-PLUS-PROTEIN 133 MG TABLET
ORAL_TABLET | Freq: Every day | ORAL | 3 refills | 270.00000 days | Status: CP
Start: 2020-04-07 — End: 2020-05-10

## 2020-04-07 MED ADMIN — heparin, porcine (PF) 100 unit/mL injection 200 Units: 200 [IU] | INTRAVENOUS | @ 13:00:00 | Stop: 2020-04-07

## 2020-04-07 NOTE — Unmapped (Signed)
[facility] ([facility]) - Psychiatry Outpatient Clinic   After Visit Summary    It was a pleasure to see you today in the [facility]s [facility] ([facility]). The [facility] is a multidisciplinary program dedicated to helping patients, caregivers, and families with cancer treatment, recovery and survivorship.      To schedule, cancel, or change your appointment:  Please call the [facility] schedulers at [phone], Monday through Friday 8AM - 5PM.  Someone will return your call within 24 hours.      If you have a question about your medicines or you need to contact your provider:  Please call the [facility] program coordinator, Myrene Galas, at [phone].     For after hours urgent issues, you may call [phone] or call the I need to talk line at [phone] ([phone] anytime 24/7.    [facility] Patient and Family Resource Center: [phone].    [facility] Website:  [email or web address]    For prescription refills, please allow at least 24 hours (during business hours, M-F) for providers to call in refills to your pharmacy. We are generally unable to accommodate same-day requests for refills.     If you are taking any controlled substances (such as anxiety or sleep medications), you must use them as the directions say to use them. We generally do not provide early refills over the phone without clear reason, and it would be inappropriate to obtain the medications from other doctors. We routinely use the [place] controlled substance database to monitor prescription drug use.

## 2020-04-07 NOTE — Unmapped (Signed)
Addended by: Westley Hummer on: 04/07/2020 02:18 PM     Modules accepted: Orders

## 2020-04-07 NOTE — Unmapped (Signed)
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  Transplant was tolerated relatively well and primarily complicated by mucositis requiring PCA.    Interval History:   [name] presents today with his fiance for f/u. He reports that his swelling maybe a bit better today to the face but he still has a lot of swelling on his legs. His weight did not really increase. He has stopped taking OTC fluid pills. His Cr has improved.    He has no diarrhea or vomiting but has AM nausea.  He takes Zofran in the AM.  At dinner he had pepperoni pizza; at lunch he had grilled chicken salad and for breakfast chicken minis x 2.  He estimates he is drinking about of fluids this consists of:  water, tea, Mt Dew  Skin is ok. No new lesions.   He had an fairly active day yesterday doing 3 loads of laundry.  He has been sleeping well with trazodone.    ROS:  A comprehensive ROS performed and is negative except for pertinent positives as listed above in interval history.     I reviewed and updated past medical, surgical, social, and family history as appropriate.     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,XY[1]    Variants of Known or Likely Clinical Significance  Gene Transcript  Predicted Protein  VAF (%)   CEBPA c.[id number] p.Ser251Argfs 41      Variants of Unknown Significance  Gene Transcript Predicted Protein VAF (%)   TET2 c.4946A>G p.Tyr1649Cys 49      -  FLT-3-ITD and FLT-3-TKD studies are negative    Pertinent Phenotypic data:    Disease-specific prognostic estimate:     Induction:   7+3+HD Dauno (C1D1 2/24)    Recovery Marrow:    Final Diagnosis   Date Value Ref Range Status   08/12/2019   Final    Bone marrow, right iliac, aspiration and biopsy  -  Normocellular bone marrow (60%) with trilineage hematopoiesis and 4% blasts by manual aspirate differential  -  Routine cytogenetic analysis is pending  -  Flow cytometric MRD analysis is pending    This electronic signature is attestation that the pathologist personally reviewed the submitted material(s) and the final diagnosis reflects that evaluation.            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;q11.2)[19]/46,XY[1]  Variants of Known or Likely Clinical Significance  Gene Transcript  Predicted Protein  VAF (%)   CEBPA c.753_762del p.Ser251Argfs 41      Variants of Unknown Significance  Gene Transcript Predicted Protein VAF (%)   TET2 c.4946A>G p.Tyr1649Cys 49      -  FLT-3-ITD and FLT-3-TKD studies are negative     07/09/2019 Initial Diagnosis    AML (acute myeloid leukemia) (CMS-HCC)     07/14/2019 - 07/20/2019 Chemotherapy    IP LEUKEMIA 7+3 HIGH DOSE DAUNOrubicin  DAUNOrubicin 90 mg/m2 IV on Days 1, 2, 3  Cytarabine 100 mg/m2 CIVI on Days 1 to 7     07/27/2019 Biopsy    Bone marrow, right iliac, aspiration and biopsy  -  Hypocellular bone marrow (<5%) with treatment effect, markedly reduced trilineage hematopoiesis, and 3% blasts by manual aspirate differential (see Comment).     08/24/2019 - 09/28/2019 Chemotherapy    IP LEUKEMIA HIGH DOSE CYTARABINE CONSOLIDATION (3 G/M2)  cytarabine 3 g/m2 every 12 hours on days 1, 3 and 5 every 28 days     08/24/2019 - 12/03/2019 Chemotherapy    IP LEUKEMIA HIGH DOSE CYTARABINE CONSOLIDATION (3 G/M2) ON DAYS 1,2,3  cytarabine 3 g/m2 IV every 12 hours on days 1, 2, 3, every 28 days     01/19/2020 - 01/19/2020 Chemotherapy    BMT OP BUSULFAN TEST DOSE  Busulfan 0.8 mg/kg IV ONCE     01/26/2020 -  Chemotherapy    BMT IP MAC BUSULFAN / FLUDARABINE / rATG (MUD)   Busulfan IV Days -6 to -3  Fludarabine 40 mg/m2 IV Days -6 to -3  rATG 0.5 mg/kg IV Day -3, 1.5 mg/kg IV Day -2, 2.5 mg/kg IV Day -1       Physical exam:  Temp:  [37 ??C (98.6 ??F)] 37 ??C (98.6 ??F)  Heart Rate:  [81] 81  BP: (117)/(77) 117/77   BP 117/77  - Pulse 81  - Temp 37 ??C (98.6 ??F) (Oral)  - Resp 16  - Wt 82.3 kg (181 lb 7 oz)  - SpO2 98%  - BMI 28.41 kg/m??   70, Cares for self; unable to carry on normal activity or to do active work (ECOG equivalent 1)    General: No acute distress noted.   Central Venous Access: Line c/d/i; No erythema, TTP, or drainage noted. Port not accesssed.   ENT: Moist mucous membranes. Oropharhynx without lesions, erythema or exudate.   Cardiovascular: Pulse normal rate, regularity and rhythm. S1 and S2 normal, without any murmur, rub, or gallop.  Lungs: Clear to auscultation bilaterally, without wheezes/crackles/rhonchi. Good air movement.   Skin: No new lesions. No rash or abrasions.  Psychiatry: Alert and oriented to person, place, and time.   Gastrointestinal/Abdomen: Normoactive bowel sounds, abdomen soft, non-tender   Musculoskeletal/Extremities: FROM throughout.+1 BLE edema and minimal puffiness around eyes.    Neurologic: CNII-XII intact. Normal strength and sensation throughout    Labs:  I reviewed all labs from today in Epic. See EMR for lab results.    Lab Results   Component Value Date    WBC 4.3 (L) 04/04/2020    HGB 11.0 (L) 04/04/2020    HCT 34.1 (L) 04/04/2020    PLT 58 (L)  04/04/2020     Lab Results   Component Value Date    NA 139 04/04/2020    K 4.6 (H) 04/04/2020    CL 106 04/04/2020    CO2 31.0 04/04/2020    BUN 23 04/04/2020    CREATININE 2.01 (H) 04/04/2020    GLU 88 04/04/2020    CALCIUM 9.8 04/04/2020    MG 2.3 04/04/2020    PHOS 4.1 02/22/2020     Lab Results   Component Value Date    BILITOT 0.5 04/04/2020    BILIDIR 0.30 02/21/2020    PROT 5.7 04/04/2020    ALBUMIN 3.2 (L) 04/04/2020    ALT 9 (L) 04/04/2020    AST 23 04/04/2020    ALKPHOS 68 04/04/2020    GGT 21 01/26/2020     Lab Results   Component Value Date    PT 14.1 (H) 02/22/2020    INR 1.21 02/22/2020    APTT 33.2 02/17/2020     Assessment and Plan    BMT:??AML, CR MRD Negative  HCT-CI (age adjusted) 7??(psych, severe pulmonary dysfunction, and age)   Conditioning:??MAC Bu/Flu/ATG  Donor:??9/10, ABO O+, CMV+  Engraftment:??Granix starting D+12 through engraftment (as defined as ANC 1.0 x 2 days or 3.0 x 1 day)  - Date of last granix injection: 02/19/20    GVHD prophylaxis:??  1.Tacrolimus being managed by pharmacy with a goal of 5-10 ng/mL  2. Methotrexate??15 mg/m2 IVP on day +1 then 10 mg/m2 on days +3, +6 and +11  3. ATG per [facility] standard dosing will??be included  ??  Heme:??  Transfusion criteria:??1 unit of PRBCs for Hgb<7 and 1 unit of platelets for Plt <10K or bleeding.   -??No history of transfusion reactions.   -Plts decreasing. DNA fingerprinting today.     ID:??  Prophylaxis:  - Antiviral: Valtrex 500mg  daily (decreased back to prophy on 11/4)   - Antifungal: Continue Fluconazole 400 mg po daily, continue through day +75  - PJP: Continue Dapsone 100mg  PO daily (has Sulfa allergy)-  Put on hold 11/17 due to decreasing counts  - CMV D+/R+: Continue Letermovir prophy, 480mg  po daily, Continue through D+100    Vesicular lesions:  - Initial AC fossa lesion 10/25, now scabbed. New sternal vesicular lesions evening 10/29, now scabbed. New vesicular lesions on right lateral forearm and right medial posterior knee, new as of 10/31. Scattered new lesions appearing as much as two weeks later.  Only on right side of body.  Vesicles form in a linear pattern in places, sometimes as long as 5-6cm but very thin  - Discussed treatment with IV acyclovir for disseminated Zoster and patient refused admission until Wednesday 03/22/20, however swabs returned as negative for VZV from vesicle swab and blood.   - Valtrex 1g TID from 11/1-11/3 with small new lesion on thumb.  With negative viral swab and longevity of symptoms less likely infectious cause.   - Possibly dermatitis?  Pt will monitor and we have now referred to dermatology and sent swab for HSV.  Will recommend starting daily zyrtec.   - No new lesions 11/16  Old lesions are healing.   ??  Neutropenic Fever:  02/03/20: Tmax 38.4 Cefepime (02/03/20 - 02/09/20)  - Cultures NGx 5 days  ??  02/13/20: Spiked again at 5am 9/26  - Cultures negative at 5 days  - Started on IV cefepime (9/26- 10/1) and IV flagyl (anaerobic coverage given mucositis) (9/26-10/1). Received a dose of IV vanc but no indication to continue.   ??  Allergy:  - PCN allergy (hives) has tolerated Cefepime without issues    Seasonal Allergies:  - Flonase and Claritin daily prn.   ??  EBV:  - IgM Ab + 01/05/20 but viral load 01/05/20 not detected.   - Viral Load 10/25, not detected.    Full virals sent 11/9 given mild thrombocytopenia and rash this panel was negative.    GI:??  GERD prophylaxis:  - has now stopped pepcid and doing well off this  ??  Renal:   AKI:   -04/05/20: AKI  Worse and up to 2 today.    -04/07/20: AKI improve to 1.7.    Continue to hold OTC diuretics  I imagine he will just 3rd space fluids if we gave it to him.  So for now we are going to hold some meds as below to see if this helps his overall picture.     FEN:   Hyperkalemia:  Currently <5        Hypomagnesemia  - Mg 2.1 today, Decrease Mag Chelate 1 tablet daily.   ??  Hepatic:??  - No active issues.   - VOD prophylaxis with Actigall TID.     CV:    HTN: Likely tac induced  - Now resolved, currently not on any antihypertensives.     Pulm: DLCO 62%  - Former smoker, quit 01/12/20. Using nicotine patch currently. Has chronic dry cough but PFTs were acceptable.   07/24/19: CT chest w/ 0.6cm RUL nodules, no clear etiology.     ** Discussed with Dr. Oswald Hillock prior to transplant, ok to move forward, no plans to repeat unless new findings.       Neuro/Pain:??  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     Psychosocial:??  - Substance abuse (opioid history). Sober for 5 years.  - Followed by Frederik Schmidt in [facility], placed referral for [facility] while admitted, are not seeing inpatients, will follow- up with him post discharge.   - Insomnia: Continue Trazodone 50mg  nightly prn.     ** Not taking melatonin.   - Anxiety: Hospitalization related. Well controlled since discharge.     ** Klonopin 1mg  nightly-Discontinued 03/15/20 as not taking it any more    Bone health:  Dexa scan from 10/13 resulted and shows low bone density.   - Seen by pharmacy and discussed zometa and calcium supplemenation 10/22  - Zometa first dose on 03/23/20    ** He has a dental appointment Monday 11/1 but just for impressions for partial implant (it is a non-invasive dental procedure as the implants are clip on with a retainer, not surgically attached, so ok to proceed with Zometa)   ??  Summary:   Unclear of causes of edema. There is some mention of edema with prevymis.  We will hold this along with fluc and actigall.  I would like to stop all non essentials and slowly re-introudce meds.  For now we will hold Dapsone.  He is coming back on Tuesday.   He has appt for impressions on 11/23 for a partial plate.         Westley Hummer ANP-BC, AOCNP  [facility]    I personally spent 53 minutes face-to-face and non-face-to-face in the care of this patient,  which includes all pre, intra, and post visit time on the date of service.

## 2020-04-07 NOTE — Unmapped (Addendum)
Hold Prevymis, Fluconazole, Actigall over the weekend.  Continue to hold the Dapsone for now.     All lab results last 24 hours:    Recent Results (from the past 24 hour(s))   Magnesium Level    Collection Time: 04/07/20  8:02 AM   Result Value Ref Range    Magnesium 2.1 1.6 - 2.6 mg/dL   Comprehensive Metabolic Panel    Collection Time: 04/07/20  8:02 AM   Result Value Ref Range    Sodium 140 135 - 145 mmol/L    Potassium 4.2 3.4 - 4.5 mmol/L    Chloride 107 98 - 107 mmol/L    Anion Gap 3 (L) 5 - 14 mmol/L    CO2 30.0 20.0 - 31.0 mmol/L    BUN 27 (H) 9 - 23 mg/dL    Creatinine 1.61 (H) 0.60 - 1.10 mg/dL    BUN/Creatinine Ratio 16     EGFR CKD-EPI Non-African American, Male 43 (L) >=60 mL/min/1.70m2    EGFR CKD-EPI African American, Male 50 (L) >=60 mL/min/1.35m2    Glucose 142 70 - 179 mg/dL    Calcium 9.4 8.7 - 09.6 mg/dL    Albumin 3.1 (L) 3.4 - 5.0 g/dL    Total Protein 5.5 (L) 5.7 - 8.2 g/dL    Total Bilirubin 0.3 0.3 - 1.2 mg/dL    AST 21 <=04 U/L    ALT 8 (L) 10 - 49 U/L    Alkaline Phosphatase 71 46 - 116 U/L   CBC w/ Differential    Collection Time: 04/07/20  8:02 AM   Result Value Ref Range    WBC 5.1 4.5 - 11.0 10*9/L    RBC 2.93 (L) 4.50 - 5.90 10*12/L    HGB 10.8 (L) 13.5 - 17.5 g/dL    HCT 54.0 (L) 98.1 - 53.0 %    MCV 117.3 (H) 80.0 - 100.0 fL    MCH 37.0 (H) 26.0 - 34.0 pg    MCHC 31.5 31.0 - 37.0 g/dL    RDW 19.1 47.8 - 29.5 %    MPV 12.1 (H) 7.0 - 10.0 fL    Platelet 48 (L) 150 - 440 10*9/L    Neutrophils % 72.8 %    Lymphocytes % 10.0 %    Monocytes % 7.7 %    Eosinophils % 5.1 %    Basophils % 0.3 %    Absolute Neutrophils 3.7 2.0 - 7.5 10*9/L    Absolute Lymphocytes 0.5 (L) 1.5 - 5.0 10*9/L    Absolute Monocytes 0.4 0.2 - 0.8 10*9/L    Absolute Eosinophils 0.3 0.0 - 0.4 10*9/L    Absolute Basophils 0.0 0.0 - 0.1 10*9/L    Large Unstained Cells 4 0 - 4 %    Macrocytosis Marked (A) Not Present    Hypochromasia Marked (A) Not Present

## 2020-04-07 NOTE — Unmapped (Signed)
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 Have episodically met with patient pre-transplant though need for psychiatric intervention has been low given existing supports. His participation in addiction recovery groups, his ongoing participation in therapy, and willingness to consider impact of mood disorders on his health and well-being all suggest positive coping qualities that should serve him well.    I have reviewed this patient's records including medical and psychiatric history, imaging, medication history, and when applicable, the PDMP and summarized above.    Risk Assessment:  A suicide and violence risk assessment was performed as part of this evaluation. There patient is deemed to be at chronic elevated risk for self-harm/suicide given the following factors: past substance abuse. There patient is deemed to be at chronic elevated risk for violence given the following factors: N/A. These risk factors are mitigated by the following factors:lack of active SI/HI. There is no acute risk for suicide or violence at this time. The patient was educated about relevant modifiable risk factors including following recommendations for treatment of psychiatric illness and abstaining from substance abuse. While future psychiatric events cannot be accurately predicted, the patient does not currently require  acute inpatient psychiatric care and does not currently meet [facility] involuntary commitment criteria.     Diagnoses:   Patient Active Problem List   Diagnosis   ??? Pancytopenia (CMS-HCC)   ??? Tobacco use disorder   ??? History of opioid abuse (CMS-HCC)   ??? AML (acute myeloid leukemia) (CMS-HCC)   ??? Anxiety   ??? Depression   ??? History of allogeneic stem cell transplant (CMS-HCC)   ??? Osteopenia determined by x-ray        Stressors: recent cancer diagnosis and treatment.       Plan:  1. Depression Disorder, NOS by history  - Prior med trials with lexapro and viibryd. Currently, not on tx and no current indication  - Continue therapy with established provider in the community.    2. Opioid use disorder, in full remission  - no acute concerns.  - continue to monitor - no intervention currently needed. He plans to minimize opioid exposure during txp.    3. Caregiver support  - Discussed available resources for his fiance.    No scheduled follow-up indicated. He has my contact information if needed. But given existing coping mechanisms and community providers, I don't think he will require [facility] involvement.    I have/will discuss the results of this assessment and plan with the referring oncology team.      Revised Medication(s) Post Visit:  Outpatient Encounter Medications as of 04/04/2020   Medication Sig Dispense Refill   ??? calcium carbonate (OS-CAL) 1,500 mg (600 mg elem calcium) tablet Take 1 tablet by mouth Two (2) times a day.     ??? cholecalciferol, vitamin D3-25 mcg, 1,000 unit,, (VITAMIN D3) 25 mcg (1,000 unit) capsule Take 25 mcg by mouth daily.      ??? dapsone 100 MG tablet Take 1 tablet (100 mg total) by mouth daily. 30 tablet 5   ??? fluconazole (DIFLUCAN) 200 MG tablet Take 2 tablets (400 mg total) by mouth daily. 60 tablet 2   ???  letermovir (PREVYMIS) 480 mg tablet Take 1 tablet (480 mg total) by mouth daily. 28 tablet 2   ??? magnesium oxide-Mg AA chelate (MAGNESIUM, AMINO ACID CHELATE,) 133 mg Tab Take 1 tablet by mouth Three (3) times a day before meals. 270 tablet 3   ??? melatonin 1 mg Tab tablet Take 2 mg by mouth nightly. (Patient not taking: Reported on 03/17/2020)     ??? ondansetron (ZOFRAN) 8 MG tablet Take 1 tablet (8 mg total) by mouth every eight (8) hours as needed for nausea. 30 tablet 2   ??? pregabalin (LYRICA) 75 MG capsule Take 75mg  (1 capsule) in the AM and 150mg  (2 capsules) in the PM 90 capsule 0   ??? tacrolimus (PROGRAF) 0.5 MG capsule Take 1 capsule (0.5 mg total) by mouth daily. 30 capsule 5   ??? traZODone (DESYREL) 50 MG tablet Take 1 tablet (50 mg total) by mouth nightly as needed for sleep (insomnia). 90 tablet 3   ??? ursodioL (ACTIGALL) 300 mg capsule Take 1 capsule (300 mg total) by mouth Three (3) times a day. 90 capsule 11   ??? valACYclovir (VALTREX) 500 MG tablet Take 1 tablet (500 mg total) by mouth daily. 30 tablet 0   ??? [DISCONTINUED] tacrolimus (PROGRAF) 0.5 MG capsule Take 2 capsules (1 mg total) by mouth daily AND 1 capsule (0.5 mg total) nightly. 90 capsule 5     No facility-administered encounter medications on file as of 04/04/2020.       Patient was provided with my contact information. He/she was instructed to call 911 for emergencies.     Thank you for allowing me to participate in the care of this patient  Toula Moos, MD    Subjective:    Met with patient during return BMT follow-up visit.  Overall feels like he is coping well with allo recovery.   Mood is overall good.   Biggest stressor is related to wedding planning (fall 2022).  Feels like he is communicating well with his fiance.  +edema which has interfered with his ability to exercise but accepting of this current limitation.  Interested in pursuing school/more education when he is farther along in transplant recovery process.  Remains very motivated for cancer treatment.  No SI/HI/HI or sx mania/psychosis.  Only used trazodone while hospitlized.  Stopped viibryd months ago withouat AE.  Both he and his fiance have therapists.      Prior: remeron, zoloft, viibryd, lexapro, wellbutrin - couple of years but stopped when he tapered opioids.      Allergies:  Penicillins and Sulfa (sulfonamide antibiotics)    Medications:   Current Outpatient Medications   Medication Sig Dispense Refill   ??? calcium carbonate (OS-CAL) 1,500 mg (600 mg elem calcium) tablet Take 1 tablet by mouth Two (2) times a day.     ??? cholecalciferol, vitamin D3-25 mcg, 1,000 unit,, (VITAMIN D3) 25 mcg (1,000 unit) capsule Take 25 mcg by mouth daily.      ??? dapsone 100 MG tablet Take 1 tablet (100 mg total) by mouth daily. 30 tablet 5   ??? fluconazole (DIFLUCAN) 200 MG tablet Take 2 tablets (400 mg total) by mouth daily. 60 tablet 2   ??? letermovir (PREVYMIS) 480 mg tablet Take 1 tablet (480 mg total) by mouth daily. 28 tablet 2   ??? magnesium oxide-Mg AA chelate (MAGNESIUM, AMINO ACID CHELATE,) 133 mg Tab Take 1 tablet by mouth Three (3) times a day before meals. 270 tablet 3   ???  melatonin 1 mg Tab tablet Take 2 mg by mouth nightly. (Patient not taking: Reported on 03/17/2020)     ??? ondansetron (ZOFRAN) 8 MG tablet Take 1 tablet (8 mg total) by mouth every eight (8) hours as needed for nausea. 30 tablet 2   ??? pregabalin (LYRICA) 75 MG capsule Take 75mg  (1 capsule) in the AM and 150mg  (2 capsules) in the PM 90 capsule 0   ??? tacrolimus (PROGRAF) 0.5 MG capsule Take 1 capsule (0.5 mg total) by mouth daily. 30 capsule 5   ??? traZODone (DESYREL) 50 MG tablet Take 1 tablet (50 mg total) by mouth nightly as needed for sleep (insomnia). 90 tablet 3   ??? ursodioL (ACTIGALL) 300 mg capsule Take 1 capsule (300 mg total) by mouth Three (3) times a day. 90 capsule 11   ??? valACYclovir (VALTREX) 500 MG tablet Take 1 tablet (500 mg total) by mouth daily. 30 tablet 0     No current facility-administered medications for this visit.       Psychiatric/Medical History:  Past Medical History:   Diagnosis Date   ??? Substance abuse (CMS-HCC)        Surgical History:  Past Surgical History:   Procedure Laterality Date   ??? BACK SURGERY     ??? HERNIA REPAIR     ??? IR INSERT PORT AGE GREATER THAN 5 YRS  08/18/2019    IR INSERT PORT AGE GREATER THAN 5 YRS 08/18/2019 Braulio Conte, MD IMG VIR [facility]   ??? nerve damage repair Right 1989       Social History:  Social History     Socioeconomic History ??? Marital status: Divorced     Spouse name: Not on file   ??? Number of children: Not on file   ??? Years of education: Not on file   ??? Highest education level: Not on file   Occupational History   ??? Not on file   Tobacco Use   ??? Smoking status: Former Smoker     Packs/day: 1.00     Types: Cigarettes     Quit date: 01/12/2020     Years since quitting: 0.2   ??? Smokeless tobacco: Never Used   Vaping Use   ??? Vaping Use: Former   Substance and Sexual Activity   ??? Alcohol use: Not Currently   ??? Drug use: Not Currently     Comment: Prior opioid abuse, sober 5 years   ??? Sexual activity: Yes   Other Topics Concern   ??? Not on file   Social History Narrative   ??? Not on file     Social Determinants of Health     Financial Resource Strain: Medium Risk   ??? Difficulty of Paying Living Expenses: Somewhat hard   Food Insecurity: No Food Insecurity   ??? Worried About Running Out of Food in the Last Year: Never true   ??? Ran Out of Food in the Last Year: Never true   Transportation Needs: No Transportation Needs   ??? Lack of Transportation (Medical): No   ??? Lack of Transportation (Non-Medical): No   Physical Activity: Not on file   Stress: Not on file   Social Connections: Not on file       Family History:  The patient's family history includes Cancer in his brother and father; Diabetes in his brother and sister..    ROS: The balance of 10 systems was reviewed with the patient.  All systems reviewed are negative except for the  following: low back pain, fatigue    Objective:     Vitals:   There were no vitals filed for this visit.    Mental Status Exam:  Appearance:    Appears stated age   Motor:   No abnormal movements   Speech/Language:    Normal rate, volume, tone, fluency   Mood:   okay   Affect:    Euthymic full range   Thought process:   Logical, linear, clear, coherent, goal directed   Thought content:     Denies SI, HI, self harm, delusions, obsessions, paranoid ideation, or ideas of reference   Perceptual disturbances:     Denies auditory and visual hallucinations, behavior not concerning for response to internal stimuli     Orientation:   Oriented to person, place, time, and general circumstances   Attention:   Able to fully attend without fluctuations in consciousness   Concentration:   Able to fully concentrate and attend   Memory:   Immediate, short-term, long-term, and recall grossly intact    Fund of knowledge:    Consistent with level of education and development   Insight:     Intact   Judgment:    Intact   Impulse Control:   Intact     PE:   General: in NAD  Neuro: CN II-XII intact.  No abnormal movements.  Gait is steady, normal-based.    Test Results:  Data Review: Lab results last 24 hours:  No results found for this or any previous visit (from the past 24 hour(s)).  Imaging: Radiology report(s) reviewed.      Toula Moos, MD  04/07/2020

## 2020-04-08 LAB — EBV QUANTITATIVE PCR, BLOOD: EBV VIRAL LOAD RESULT: NOT DETECTED

## 2020-04-08 NOTE — Unmapped (Addendum)
Bone Marrow Transplant and Cellular Therapy Program  Immunosuppressive Therapy Note    [name] is a 59 y.o. 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 [name] is currently day +63 (as of 11/17).    Current dose: tacrolimus 0.5 mg once daily (dose decreased on 11/17)    Goal tacrolimus Level: 5-10 ng/mL    Resulted level: 4.1 ng/mL (drawn as a true trough)    Lab Results   Component Value Date/Time    TACROLIMUS 4.1 (L) 04/07/2020 08:02 AM    TACROLIMUS 9.0 04/04/2020 12:54 PM    TACROLIMUS 11.9 03/31/2020 09:25 AM     Lab Results   Component Value Date/Time    CREATININE 1.72 (H) 04/07/2020 08:02 AM    CREATININE 2.01 (H) 04/04/2020 12:54 PM    CREATININE 1.61 (H) 03/31/2020 09:25 AM     Tacrolimus level is subtherapeutic today and renal function is improving.  Plan to increase tacrolimus dose to 0.5 mg PO twice daily and  recheck tacrolimus level and renal function at next clinic visit. Spoke with [name] and his fiancee and they both verbalized understanding of this plan.     We will continue to monitor levels.  Patient will be followed for changes in renal and hepatic function, toxicity, and efficacy.     Rulon Abide, PharmD, BCOP  Clinical Pharmacist Practitioner, BMT

## 2020-04-11 ENCOUNTER — Encounter: Admit: 2020-04-11 | Discharge: 2020-04-11 | Payer: PRIVATE HEALTH INSURANCE

## 2020-04-11 DIAGNOSIS — C9201 Acute myeloblastic leukemia, in remission: Principal | ICD-10-CM

## 2020-04-11 DIAGNOSIS — D84822 Immunocompromised state associated with stem cell transplant (CMS-HCC): Principal | ICD-10-CM

## 2020-04-11 DIAGNOSIS — Z9484 Stem cells transplant status: Principal | ICD-10-CM

## 2020-04-11 DIAGNOSIS — I10 Essential (primary) hypertension: Principal | ICD-10-CM

## 2020-04-11 DIAGNOSIS — R600 Localized edema: Principal | ICD-10-CM

## 2020-04-11 LAB — CBC W/ AUTO DIFF
BASOPHILS ABSOLUTE COUNT: 0 10*9/L (ref 0.0–0.1)
BASOPHILS RELATIVE PERCENT: 0.4 %
EOSINOPHILS ABSOLUTE COUNT: 0.2 10*9/L (ref 0.0–0.4)
EOSINOPHILS RELATIVE PERCENT: 5.2 %
HEMATOCRIT: 34.3 % — ABNORMAL LOW (ref 41.0–53.0)
HEMOGLOBIN: 10.8 g/dL — ABNORMAL LOW (ref 13.5–17.5)
LARGE UNSTAINED CELLS: 4 % (ref 0–4)
LYMPHOCYTES ABSOLUTE COUNT: 0.4 10*9/L — ABNORMAL LOW (ref 1.5–5.0)
LYMPHOCYTES RELATIVE PERCENT: 10.8 %
MEAN CORPUSCULAR HEMOGLOBIN CONC: 31.6 g/dL (ref 31.0–37.0)
MEAN CORPUSCULAR HEMOGLOBIN: 36.5 pg — ABNORMAL HIGH (ref 26.0–34.0)
MEAN CORPUSCULAR VOLUME: 115.6 fL — ABNORMAL HIGH (ref 80.0–100.0)
MEAN PLATELET VOLUME: 12.2 fL — ABNORMAL HIGH (ref 7.0–10.0)
MONOCYTES ABSOLUTE COUNT: 0.3 10*9/L (ref 0.2–0.8)
MONOCYTES RELATIVE PERCENT: 8.5 %
NEUTROPHILS ABSOLUTE COUNT: 2.7 10*9/L (ref 2.0–7.5)
NEUTROPHILS RELATIVE PERCENT: 70.9 %
PLATELET COUNT: 58 10*9/L — ABNORMAL LOW (ref 150–440)
RED BLOOD CELL COUNT: 2.96 10*12/L — ABNORMAL LOW (ref 4.50–5.90)
RED CELL DISTRIBUTION WIDTH: 14.6 % (ref 12.0–15.0)
WBC ADJUSTED: 3.8 10*9/L — ABNORMAL LOW (ref 4.5–11.0)

## 2020-04-11 LAB — COMPREHENSIVE METABOLIC PANEL
ALBUMIN: 2.9 g/dL — ABNORMAL LOW (ref 3.4–5.0)
ALKALINE PHOSPHATASE: 62 U/L (ref 46–116)
ALT (SGPT): <7 U/L — ABNORMAL LOW (ref 10–49)
ANION GAP: 3 mmol/L — ABNORMAL LOW (ref 5–14)
AST (SGOT): 16 U/L (ref <=34)
BILIRUBIN TOTAL: 0.3 mg/dL (ref 0.3–1.2)
BLOOD UREA NITROGEN: 21 mg/dL (ref 9–23)
BUN / CREAT RATIO: 17
CALCIUM: 8.7 mg/dL (ref 8.7–10.4)
CHLORIDE: 108 mmol/L — ABNORMAL HIGH (ref 98–107)
CO2: 28 mmol/L (ref 20.0–31.0)
CREATININE: 1.22 mg/dL — ABNORMAL HIGH
EGFR CKD-EPI AA MALE: 76 mL/min/{1.73_m2} (ref >=60)
EGFR CKD-EPI NON-AA MALE: 65 mL/min/{1.73_m2} (ref >=60)
GLUCOSE RANDOM: 111 mg/dL (ref 70–179)
POTASSIUM: 3.9 mmol/L (ref 3.4–4.5)
PROTEIN TOTAL: 5.4 g/dL — ABNORMAL LOW (ref 5.7–8.2)
SODIUM: 139 mmol/L (ref 135–145)

## 2020-04-11 LAB — TACROLIMUS LEVEL, TROUGH: TACROLIMUS, TROUGH: 3.7 ng/mL — ABNORMAL LOW (ref 5.0–15.0)

## 2020-04-11 LAB — MAGNESIUM: MAGNESIUM: 1.6 mg/dL (ref 1.6–2.6)

## 2020-04-11 LAB — SLIDE REVIEW

## 2020-04-11 MED ORDER — TACROLIMUS 0.5 MG CAPSULE, IMMEDIATE-RELEASE
ORAL_CAPSULE | Freq: Two times a day (BID) | ORAL | 5 refills | 30 days | Status: CP
Start: 2020-04-11 — End: 2020-04-21

## 2020-04-11 MED ADMIN — heparin, porcine (PF) 100 unit/mL injection 500 Units: 500 [IU] | INTRAVENOUS | @ 13:00:00 | Stop: 2020-04-11

## 2020-04-11 NOTE — Unmapped (Signed)
Clinical Assessment Needed For: Dose Change  Medication: tacrolimus  Last Fill Date/Day Supply: 02/23/20 / 30  Copay $0  Was previous dose already scheduled to fill: No    Notes to Pharmacist: Prescriber comment: Updating dose; please put updated RX on file

## 2020-04-11 NOTE — Unmapped (Signed)
BMT Clinic Progress Note      Referring physician:  Charlotta Newton, MD   BMT Attending MD: Lanae Boast, MD     Disease: AML  Current disease status: CR MRD Negative  Type of Transplant: MAC MMUD  Graft Source: Fresh PBSCs  Transplant Day: +69    Donor information:   Type of stem cells: unrelated male  Blood Type: O+  CMV Status: positive  Type of match: 9/10    HPI:   John Erickson is a 59yo with AML in CR/MRD-, seen for follow-up s/p MAC MMUD.  Transplant was tolerated relatively well and primarily complicated by mucositis requiring PCA.    Interval History:   John Erickson presents today with his fiance for f/u. He reports that his swelling continues to improve: face in particular is much improved.    He has no diarrhea or vomiting but has AM nausea.  He takes Zofran in the AM.  He is eating reasonably well. Is drinking about 2 L of fluids/day.   Skin is ok. No new lesions.   He has been sleeping well with trazodone.  Today c/o back pain - same as he gets from time to time.     ROS:  A comprehensive ROS performed and is negative except for pertinent positives as listed above in interval history.     I reviewed and updated past medical, surgical, social, and family history as appropriate.     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,XY[1]    Variants of Known or Likely Clinical Significance  Gene Transcript  Predicted Protein  VAF (%)   CEBPA c.753_762del p.Ser251Argfs 41      Variants of Unknown Significance  Gene Transcript Predicted Protein VAF (%)   TET2 c.4946A>G p.Tyr1649Cys 49      -  FLT-3-ITD and FLT-3-TKD studies are negative    Pertinent Phenotypic data:    Disease-specific prognostic estimate:     Induction:   7+3+HD Dauno (C1D1 2/24)    Recovery Marrow:    Final Diagnosis   Date Value Ref Range Status   08/12/2019   Final    Bone marrow, right iliac, aspiration and biopsy  -  Normocellular bone marrow (60%) with trilineage hematopoiesis and 4% blasts by manual aspirate differential  -  Routine cytogenetic analysis is pending  -  Flow cytometric MRD analysis is pending    This electronic signature is attestation that the pathologist personally reviewed the submitted material(s) and the final diagnosis reflects that evaluation.            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,XY[1]  Variants  of Known or Likely Clinical Significance  Gene Transcript  Predicted Protein  VAF (%)   CEBPA c.753_762del p.Ser251Argfs 41      Variants of Unknown Significance  Gene Transcript Predicted Protein VAF (%)   TET2 c.4946A>G p.Tyr1649Cys 49      -  FLT-3-ITD and FLT-3-TKD studies are negative     07/09/2019 Initial Diagnosis    AML (acute myeloid leukemia) (CMS-HCC)     07/14/2019 - 07/20/2019 Chemotherapy    IP LEUKEMIA 7+3 HIGH DOSE DAUNOrubicin  DAUNOrubicin 90 mg/m2 IV on Days 1, 2, 3  Cytarabine 100 mg/m2 CIVI on Days 1 to 7     07/27/2019 Biopsy    Bone marrow, right iliac, aspiration and biopsy  -  Hypocellular bone marrow (<5%) with treatment effect, markedly reduced trilineage hematopoiesis, and 3% blasts by manual aspirate differential (see Comment).     08/24/2019 - 09/28/2019 Chemotherapy    IP LEUKEMIA HIGH DOSE CYTARABINE CONSOLIDATION (3 G/M2)  cytarabine 3 g/m2 every 12 hours on days 1, 3 and 5 every 28 days     08/24/2019 - 12/03/2019 Chemotherapy    IP LEUKEMIA HIGH DOSE CYTARABINE CONSOLIDATION (3 G/M2) ON DAYS 1,2,3  cytarabine 3 g/m2 IV every 12 hours on days 1, 2, 3, every 28 days     01/19/2020 - 01/19/2020 Chemotherapy    BMT OP BUSULFAN TEST DOSE  Busulfan 0.8 mg/kg IV ONCE     01/26/2020 -  Chemotherapy    BMT IP MAC BUSULFAN / FLUDARABINE / rATG (MUD)   Busulfan IV Days -6 to -3  Fludarabine 40 mg/m2 IV Days -6 to -3  rATG 0.5 mg/kg IV Day -3, 1.5 mg/kg IV Day -2, 2.5 mg/kg IV Day -1       Physical exam:  BP 113/80  - Pulse 82  - Temp 36.7 ??C (98.1 ??F) (Temporal)  - Resp 16  - Ht 170.2 cm (5' 7.01)  - Wt 81.4 kg (179 lb 6.4 oz)  - SpO2 93%  - BMI 28.09 kg/m??   70, Cares for self; unable to carry on normal activity or to do active work (ECOG equivalent 1)    General: No acute distress noted.   Central Venous Access: Line c/d/i; No erythema, TTP, or drainage noted. Port not accesssed.   ENT: Moist mucous membranes. Oropharhynx without lesions, erythema or exudate.   Cardiovascular: Pulse normal rate, regularity and rhythm. S1 and S2 normal, without any murmur, rub, or gallop.  Lungs: Clear to auscultation bilaterally, without wheezes/crackles/rhonchi. Good air movement.   Skin: No new lesions. No rash or abrasions.  Psychiatry: Alert and oriented to person, place, and time.   Gastrointestinal/Abdomen: Normoactive bowel sounds, abdomen soft, non-tender   Musculoskeletal/Extremities: FROM throughout.+1 BLE edema and minimal puffiness around eyes.    Neurologic: CNII-XII intact. Normal strength and sensation throughout    Labs:  I reviewed all labs from today in Epic. See EMR for lab results.    Lab Results   Component Value Date    WBC 5.1 04/07/2020    HGB 10.8 (L) 04/07/2020    HCT 34.4 (L) 04/07/2020    PLT 48 (L) 04/07/2020     Lab Results   Component Value Date    NA 140 04/07/2020    K 4.2 04/07/2020    CL 107 04/07/2020    CO2 30.0 04/07/2020    BUN 27 (H) 04/07/2020    CREATININE 1.72 (H) 04/07/2020    GLU 142 04/07/2020    CALCIUM  9.4 04/07/2020    MG 2.1 04/07/2020    PHOS 4.1 02/22/2020     Lab Results   Component Value Date    BILITOT 0.3 04/07/2020    BILIDIR 0.30 02/21/2020    PROT 5.5 (L) 04/07/2020    ALBUMIN 3.1 (L) 04/07/2020    ALT 8 (L) 04/07/2020    AST 21 04/07/2020    ALKPHOS 71 04/07/2020    GGT 21 01/26/2020     Lab Results   Component Value Date    PT 14.1 (H) 02/22/2020    INR 1.21 02/22/2020    APTT 33.2 02/17/2020     Assessment and Plan    BMT:??AML, CR MRD Negative  HCT-CI (age adjusted) 7??(psych, severe pulmonary dysfunction, and age)   Conditioning:??MAC Bu/Flu/ATG  Donor:??9/10, ABO O+, CMV+  Engraftment:??Granix starting D+12 through engraftment (as defined as ANC 1.0 x 2 days or 3.0 x 1 day)  - Date of last granix injection: 02/19/20    GVHD prophylaxis:??  1.Tacrolimus being managed by pharmacy with a goal of 5-10 ng/mL  2. Methotrexate??15 mg/m2 IVP on day +1 then 10 mg/m2 on days +3, +6 and +11  3. ATG per [facility] standard dosing will??be included  ??  Heme:??  Transfusion criteria:??1 unit of PRBCs for Hgb<7 and 1 unit of platelets for Plt <10K or bleeding.   -??No history of transfusion reactions.   -Plts decreasing. DNA fingerprinting today.     ID:??  Prophylaxis:  - Antiviral: Valtrex 500mg  daily (decreased back to prophy on 11/4)   - Antifungal: Continue Fluconazole 400 mg po daily, continue through day +75  - PJP: Continue Dapsone 100mg  PO daily (has Sulfa allergy)-  Put on hold 11/17 due to decreasing counts  He reports that he never took Dapsone   - CMV D+/R+: Continue Letermovir prophy, 480mg  po daily, Continue through D+100    Vesicular lesions:  - Initial AC fossa lesion 10/25, now scabbed. New sternal vesicular lesions evening 10/29, now scabbed. New vesicular lesions on right lateral forearm and right medial posterior knee, new as of 10/31. Scattered new lesions appearing as much as two weeks later.  Only on right side of body.  Vesicles form in a linear pattern in places, sometimes as long as 5-6cm but very thin  - Discussed treatment with IV acyclovir for disseminated Zoster and patient refused admission until Wednesday 03/22/20, however swabs returned as negative for VZV from vesicle swab and blood.   - Valtrex 1g TID from 11/1-11/3 with small new lesion on thumb.  With negative viral swab and longevity of symptoms less likely infectious cause.   - Possibly dermatitis?  Pt will monitor and we have now referred to dermatology and sent swab for HSV.  Will recommend starting daily zyrtec.   - No new lesions 11/16  Old lesions are healing.   ??  Neutropenic Fever:  02/03/20: Tmax 38.4 Cefepime (02/03/20 - 02/09/20)  - Cultures NGx 5 days  ??  02/13/20: Spiked again at 5am 9/26  - Cultures negative at 5 days  - Started on IV cefepime (9/26- 10/1) and IV flagyl (anaerobic coverage given mucositis) (9/26-10/1). Received a dose of IV vanc but no indication to continue.   ??  Allergy:  - PCN allergy (hives) has tolerated Cefepime without issues    Seasonal Allergies:  - Flonase and Claritin daily prn.   ??  EBV:  - IgM Ab + 01/05/20 but viral load 01/05/20 not detected.   - Viral Load 10/25,  not detected.    Full virals sent 11/9 given mild thrombocytopenia and rash this panel was negative.    GI:??  GERD prophylaxis:  - has now stopped pepcid and doing well off this  ??  Renal:   AKI:   -04/05/20: AKI  Worse and up to 2 today.    -04/07/20: AKI improve to 1.7.    Continue to hold OTC diuretics  I imagine he will just 3rd space fluids if we gave it to him.  So for now we are going to hold some meds as below to see if this helps his overall picture.     FEN:   Hyperkalemia:  Currently <5        Hypomagnesemia  - Mg 2.1 today, Decrease Mag Chelate 1 tablet daily.   ??  Hepatic:??  - No active issues.   - VOD prophylaxis with Actigall TID.     CV:    HTN: Likely tac induced  - Now resolved, currently not on any antihypertensives.     Pulm: DLCO 62%  - Former smoker, quit 01/12/20. Using nicotine patch currently. Has chronic dry cough but PFTs were acceptable.   07/24/19: CT chest w/ 0.6cm RUL nodules, no clear etiology.     ** Discussed with Dr. Oswald Hillock prior to transplant, ok to move forward, no plans to repeat unless new findings.       Neuro/Pain:??  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     Psychosocial:??  - Substance abuse (opioid history). Sober for 5 years.  - Followed by Frederik Schmidt in [facility], placed referral for [facility] while admitted, are not seeing inpatients, will follow- up with him post discharge.   - Insomnia: Continue Trazodone 50mg  nightly prn.     ** Not taking melatonin.   - Anxiety: Hospitalization related. Well controlled since discharge.     ** Klonopin 1mg  nightly-Discontinued 03/15/20 as not taking it any more    Bone health:  Dexa scan from 10/13 resulted and shows low bone density.   - Seen by pharmacy and discussed zometa and calcium supplemenation 10/22  - Zometa first dose on 03/23/20    ** He has a dental appointment Monday 11/1 but just for impressions for partial implant (it is a non-invasive dental procedure as the implants are clip on with a retainer, not surgically attached, so ok to proceed with Zometa)   ??  Summary:   Unclear of causes of edema. However, started to improve. Today much better.   Continue holding the drugs. Follow closely.      Lanae Boast, MD  Associate Professor  [facility]

## 2020-04-11 NOTE — Unmapped (Addendum)
Continue holding Dapsone, fluconazole, Prevemis and Actigall.   Can use Advil for back pain as needed for another 2-3 days. Please try not to take it around the clock.

## 2020-04-11 NOTE — Unmapped (Signed)
Bone Marrow Transplant and Cellular Therapy Program  Immunosuppressive Therapy Note    [name] is a 59 y.o. 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 [name] is currently day +69.    Current dose: tacrolimus 0.5 mg PO BID (dose increased on 11/19)    Goal tacrolimus Level: 5-10 ng/mL    Resulted level: 3.7 ng/mL (drawn as true trough)    Lab Results   Component Value Date/Time    TACROLIMUS 3.7 (L) 04/11/2020 08:28 AM    TACROLIMUS 4.1 (L) 04/07/2020 08:02 AM    TACROLIMUS 9.0 04/04/2020 12:54 PM     Lab Results   Component Value Date/Time    CREATININE 1.22 (H) 04/11/2020 08:28 AM    CREATININE 1.72 (H) 04/07/2020 08:02 AM    CREATININE 2.01 (H) 04/04/2020 12:54 PM     Recommendation: Tacrolimus level today is subtherapeutic. His Scr significantly improved (down to 1.22 from 1.72 on 11/19). LFTs stable/WNL. Given the above, will increase tacrolimus to 1 mg PO BID and recheck tacrolimus level and renal function at next clinic visit. Patient verbalized understanding of plan.    We will continue to monitor levels. Patient will be followed for changes in renal and hepatic function, toxicity, and efficacy.     Chilton Greathouse, PharmD  PGY-2 Hematology/Oncology Pharmacy Resident     Attestation:  I have reviewed and agree with the assessment and plan as outlined by the pharmacy resident.    Rulon Abide, PharmD BCOP  BMT Clinical Pharmacist Practitioner

## 2020-04-12 LAB — CMV DNA, QUANTITATIVE, PCR
CMV QUANT: <50 [IU]/mL — ABNORMAL HIGH (ref <0)
CMV VIRAL LD: DETECTED — AB

## 2020-04-13 LAB — EBV QUANTITATIVE PCR, BLOOD: EBV VIRAL LOAD RESULT: NOT DETECTED

## 2020-04-18 ENCOUNTER — Other Ambulatory Visit: Admit: 2020-04-18 | Discharge: 2020-04-19 | Payer: PRIVATE HEALTH INSURANCE

## 2020-04-18 ENCOUNTER — Encounter: Admit: 2020-04-18 | Discharge: 2020-04-19 | Payer: PRIVATE HEALTH INSURANCE

## 2020-04-18 DIAGNOSIS — R197 Diarrhea, unspecified: Principal | ICD-10-CM

## 2020-04-18 DIAGNOSIS — I10 Essential (primary) hypertension: Principal | ICD-10-CM

## 2020-04-18 DIAGNOSIS — E875 Hyperkalemia: Principal | ICD-10-CM

## 2020-04-18 DIAGNOSIS — C9200 Acute myeloblastic leukemia, not having achieved remission: Principal | ICD-10-CM

## 2020-04-18 DIAGNOSIS — Z880 Allergy status to penicillin: Principal | ICD-10-CM

## 2020-04-18 DIAGNOSIS — Z87891 Personal history of nicotine dependence: Principal | ICD-10-CM

## 2020-04-18 DIAGNOSIS — Z9484 Stem cells transplant status: Principal | ICD-10-CM

## 2020-04-18 DIAGNOSIS — C9201 Acute myeloblastic leukemia, in remission: Principal | ICD-10-CM

## 2020-04-18 DIAGNOSIS — Z9481 Bone marrow transplant status: Principal | ICD-10-CM

## 2020-04-18 DIAGNOSIS — D84822 Immunocompromised state associated with stem cell transplant (CMS-HCC): Principal | ICD-10-CM

## 2020-04-18 LAB — CBC W/ AUTO DIFF
BASOPHILS ABSOLUTE COUNT: 0 10*9/L (ref 0.0–0.1)
BASOPHILS RELATIVE PERCENT: 0.2 %
EOSINOPHILS ABSOLUTE COUNT: 0 10*9/L (ref 0.0–0.4)
EOSINOPHILS RELATIVE PERCENT: 0.2 %
HEMATOCRIT: 37.6 % — ABNORMAL LOW (ref 41.0–53.0)
HEMOGLOBIN: 12.2 g/dL — ABNORMAL LOW (ref 13.5–17.5)
LARGE UNSTAINED CELLS: 5 % — ABNORMAL HIGH (ref 0–4)
LYMPHOCYTES ABSOLUTE COUNT: 0.5 10*9/L — ABNORMAL LOW (ref 1.5–5.0)
LYMPHOCYTES RELATIVE PERCENT: 10.2 %
MEAN CORPUSCULAR HEMOGLOBIN CONC: 32.4 g/dL (ref 31.0–37.0)
MEAN CORPUSCULAR HEMOGLOBIN: 36.1 pg — ABNORMAL HIGH (ref 26.0–34.0)
MEAN CORPUSCULAR VOLUME: 111.3 fL — ABNORMAL HIGH (ref 80.0–100.0)
MEAN PLATELET VOLUME: 12 fL — ABNORMAL HIGH (ref 7.0–10.0)
MONOCYTES ABSOLUTE COUNT: 0.5 10*9/L (ref 0.2–0.8)
MONOCYTES RELATIVE PERCENT: 9.5 %
NEUTROPHILS ABSOLUTE COUNT: 3.6 10*9/L (ref 2.0–7.5)
NEUTROPHILS RELATIVE PERCENT: 74.6 %
PLATELET COUNT: 62 10*9/L — ABNORMAL LOW (ref 150–440)
RED BLOOD CELL COUNT: 3.38 10*12/L — ABNORMAL LOW (ref 4.50–5.90)
RED CELL DISTRIBUTION WIDTH: 14.6 % (ref 12.0–15.0)
WBC ADJUSTED: 4.8 10*9/L (ref 4.5–11.0)

## 2020-04-18 LAB — COMPREHENSIVE METABOLIC PANEL
ALBUMIN: 3.2 g/dL — ABNORMAL LOW (ref 3.4–5.0)
ALKALINE PHOSPHATASE: 63 U/L (ref 46–116)
ALT (SGPT): 9 U/L — ABNORMAL LOW (ref 10–49)
ANION GAP: 8 mmol/L (ref 5–14)
AST (SGOT): 18 U/L (ref <=34)
BILIRUBIN TOTAL: 0.5 mg/dL (ref 0.3–1.2)
BLOOD UREA NITROGEN: 13 mg/dL (ref 9–23)
BUN / CREAT RATIO: 10
CALCIUM: 8.7 mg/dL (ref 8.7–10.4)
CHLORIDE: 104 mmol/L (ref 98–107)
CO2: 26 mmol/L (ref 20.0–31.0)
CREATININE: 1.28 mg/dL — ABNORMAL HIGH
EGFR CKD-EPI AA MALE: 71 mL/min/{1.73_m2} (ref >=60)
EGFR CKD-EPI NON-AA MALE: 62 mL/min/{1.73_m2} (ref >=60)
GLUCOSE RANDOM: 116 mg/dL (ref 70–179)
POTASSIUM: 3.3 mmol/L — ABNORMAL LOW (ref 3.4–4.5)
PROTEIN TOTAL: 6.1 g/dL (ref 5.7–8.2)
SODIUM: 138 mmol/L (ref 135–145)

## 2020-04-18 LAB — SLIDE REVIEW

## 2020-04-18 LAB — MAGNESIUM: MAGNESIUM: 1.5 mg/dL — ABNORMAL LOW (ref 1.6–2.6)

## 2020-04-18 MED ORDER — PROMETHAZINE 25 MG TABLET
ORAL_TABLET | Freq: Four times a day (QID) | ORAL | 1 refills | 8 days | Status: CP | PRN
Start: 2020-04-18 — End: 2020-05-03

## 2020-04-18 MED ADMIN — potassium chloride 20 mEq in 100 mL IVPB Premix: 20 meq | INTRAVENOUS | @ 20:00:00 | Stop: 2020-04-18

## 2020-04-18 MED ADMIN — heparin, porcine (PF) 100 unit/mL injection 200 Units: 200 [IU] | INTRAVENOUS | @ 18:00:00 | Stop: 2020-04-18

## 2020-04-18 MED ADMIN — potassium chloride 20 mEq in 100 mL IVPB Premix: 20 meq | INTRAVENOUS | @ 21:00:00 | Stop: 2020-04-18

## 2020-04-18 MED ADMIN — magnesium sulfate 2gm/50mL IVPB: 2 g | INTRAVENOUS | @ 20:00:00 | Stop: 2020-04-18

## 2020-04-18 MED ADMIN — dextrose 5 % and sodium chloride 0.9 % bolus 1,000 mL: 1000 mL | INTRAVENOUS | @ 20:00:00 | Stop: 2020-04-18

## 2020-04-18 NOTE — Unmapped (Signed)
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  Transplant was tolerated relatively well and primarily complicated by mucositis requiring PCA.    Interval History:   John Erickson presents today with his fiance for f/u. He developed generalized fluid retention/swelling of unknown etiology. We held Dapsone, fluconazole, Prevemis and Actigall.     Today he reports swelling markedly decreased - back to baseline. But since Friday morning has had diarrhea and cramps: 7-8 episodes of watery bowel movements a day. Mild tenderness in LLQ. Worse when he tried to eat, even though was able to eat just toast or crackers. Last bowel movement was this morning. Denies fevers. HAs had some nausea but no vomiting. Swallowing pills causes some stomach upset.  Putting heating pad on his stomach makes him feel more comfortable.    Skin is ok. No new lesions.     Continue holding Dapsone, fluconazole, Prevemis and Actigall.     ROS:  A comprehensive ROS performed and is negative except for pertinent positives as listed above in interval history.     I reviewed and updated past medical, surgical, social, and family history as appropriate.     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,XY[1]    Variants of Known or Likely Clinical Significance  Gene Transcript  Predicted Protein  VAF (%) CEBPA c.[id number] p.Ser251Argfs 41      Variants of Unknown Significance  Gene Transcript Predicted Protein VAF (%)   TET2 c.4946A>G p.Tyr1649Cys 49      -  FLT-3-ITD and FLT-3-TKD studies are negative    Pertinent Phenotypic data:    Disease-specific prognostic estimate:     Induction:   7+3+HD Dauno (C1D1 2/24)    Recovery Marrow:    Final Diagnosis   Date Value Ref Range Status   08/12/2019   Final    Bone marrow, right iliac, aspiration and biopsy  -  Normocellular bone marrow (60%) with trilineage hematopoiesis and 4% blasts by manual aspirate differential  -  Routine cytogenetic analysis is pending  -  Flow cytometric MRD analysis is pending    This electronic signature is attestation that the pathologist personally reviewed the submitted material(s) and the final diagnosis reflects that evaluation.            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           Molecular Genetics: No results found for: MYELOIDMP       AML (acute myeloid leukemia) (CMS-HCC)  07/09/2019 Biopsy    Bone marrow, left iliac, aspiration and biopsy  -  Hypercellular bone marrow (95%) involved by acute myeloid leukemia (42% blasts by manual aspirate differential)   Abnormal Karyotype: 46,Y,t(X;8)(q26;q11.2)[19]/46,XY[1]  Variants of Known or Likely Clinical Significance  Gene Transcript  Predicted Protein  VAF (%)   CEBPA c.753_762del p.Ser251Argfs 41      Variants of Unknown Significance  Gene Transcript Predicted Protein VAF (%)   TET2 c.4946A>G p.Tyr1649Cys 49      -  FLT-3-ITD and FLT-3-TKD studies are negative     07/09/2019 Initial Diagnosis    AML (acute myeloid leukemia) (CMS-HCC)     07/14/2019 - 07/20/2019 Chemotherapy    IP LEUKEMIA 7+3 HIGH DOSE DAUNOrubicin  DAUNOrubicin 90 mg/m2 IV on Days 1, 2, 3  Cytarabine 100 mg/m2 CIVI on Days 1 to 7     07/27/2019 Biopsy    Bone marrow, right iliac, aspiration and biopsy  -  Hypocellular bone marrow (<5%) with treatment effect, markedly reduced trilineage hematopoiesis, and 3% blasts by manual aspirate differential (see Comment).     08/24/2019 - 09/28/2019 Chemotherapy    IP LEUKEMIA HIGH DOSE CYTARABINE CONSOLIDATION (3 G/M2)  cytarabine 3 g/m2 every 12 hours on days 1, 3 and 5 every 28 days     08/24/2019 - 12/03/2019 Chemotherapy    IP LEUKEMIA HIGH DOSE CYTARABINE CONSOLIDATION (3 G/M2) ON DAYS 1,2,3  cytarabine 3 g/m2 IV every 12 hours on days 1, 2, 3, every 28 days     01/19/2020 - 01/19/2020 Chemotherapy    BMT OP BUSULFAN TEST DOSE  Busulfan 0.8 mg/kg IV ONCE     01/26/2020 -  Chemotherapy    BMT IP MAC BUSULFAN / FLUDARABINE / rATG (MUD)   Busulfan IV Days -6 to -3  Fludarabine 40 mg/m2 IV Days -6 to -3  rATG 0.5 mg/kg IV Day -3, 1.5 mg/kg IV Day -2, 2.5 mg/kg IV Day -1       Physical exam:  BP 135/89  - Pulse 97  - Temp 36.8 ??C (98.3 ??F) (Temporal)  - Resp 16  - Ht 170.2 cm (5' 7.01)  - Wt 73.1 kg (161 lb 3.2 oz)  - SpO2 98%  - BMI 25.24 kg/m??     70, Cares for self; unable to carry on normal activity or to do active work (ECOG equivalent 1)    General: No acute distress noted.   Central Venous Access: Line c/d/i; No erythema, TTP, or drainage noted. Port not accesssed.   ENT: Moist mucous membranes. Oropharhynx without lesions, erythema or exudate.   Cardiovascular: Pulse normal rate, regularity and rhythm. S1 and S2 normal, without any murmur, rub, or gallop.  Lungs: Clear to auscultation bilaterally, without wheezes/crackles/rhonchi. Good air movement.   Skin: No new lesions. No rash or abrasions.  Psychiatry: Alert and oriented to person, place, and time.   Gastrointestinal/Abdomen: Normoactive bowel sounds, abdomen soft, with minimal tenderness in LLQ.   Musculoskeletal/Extremities: FROM throughout. No peripheral edema.     Neurologic: CNII-XII intact. Normal strength and sensation throughout    Labs:  I reviewed all labs from today in Epic. See EMR for lab results.    Lab Results   Component Value Date    WBC 3.8 (L) 04/11/2020    HGB 10.8 (L) 04/11/2020    HCT 34.3 (L) 04/11/2020    PLT 58 (L) 04/11/2020     Lab Results   Component Value Date    NA 139 04/11/2020  K 3.9 04/11/2020    CL 108 (H) 04/11/2020    CO2 28.0 04/11/2020    BUN 21 04/11/2020    CREATININE 1.22 (H) 04/11/2020    GLU 111 04/11/2020    CALCIUM 8.7 04/11/2020    MG 1.6 04/11/2020    PHOS 4.1 02/22/2020     Lab Results   Component Value Date    BILITOT 0.3 04/11/2020    BILIDIR 0.30 02/21/2020    PROT 5.4 (L) 04/11/2020    ALBUMIN 2.9 (L) 04/11/2020    ALT <7 (L) 04/11/2020    AST 16 04/11/2020    ALKPHOS 62 04/11/2020    GGT 21 01/26/2020     Lab Results   Component Value Date    PT 14.1 (H) 02/22/2020    INR 1.21 02/22/2020    APTT 33.2 02/17/2020     Assessment and Plan    BMT:??AML, CR MRD Negative  HCT-CI (age adjusted) 7??(psych, severe pulmonary dysfunction, and age)   Conditioning:??MAC Bu/Flu/ATG  Donor:??9/10, ABO O+, CMV+  Engraftment:??Granix starting D+12 through engraftment (as defined as ANC 1.0 x 2 days or 3.0 x 1 day)  - Date of last granix injection: 02/19/20    GVHD prophylaxis:??  1.Tacrolimus being managed by pharmacy with a goal of 5-10 ng/mL. Today 2.7. likely due to diarrhea.   2. Methotrexate??15 mg/m2 IVP on day +1 then 10 mg/m2 on days +3, +6 and +11  3. ATG per [facility] standard dosing will??be included  ??  Heme:??  Transfusion criteria:??1 unit of PRBCs for Hgb<7 and 1 unit of platelets for Plt <10K or bleeding.   -??No history of transfusion reactions.   -Plts decreasing. DNA fingerprinting today.     ID:??  Prophylaxis:  - Antiviral: Valtrex 500mg  daily (decreased back to prophy on 11/4)   - Antifungal: Continue Fluconazole 400 mg po daily, continue through day +75  - PJP: Continue Dapsone 100mg  PO daily (has Sulfa allergy)-  Put on hold 11/17 due to decreasing counts  He reports that he never took Dapsone   - CMV D+/R+: Continue Letermovir prophy, 480mg  po daily, Continue through D+100    Vesicular lesions:  - Initial AC fossa lesion 10/25, now scabbed. New sternal vesicular lesions evening 10/29, now scabbed. New vesicular lesions on right lateral forearm and right medial posterior knee, new as of 10/31. Scattered new lesions appearing as much as two weeks later.  Only on right side of body.  Vesicles form in a linear pattern in places, sometimes as long as 5-6cm but very thin  - Discussed treatment with IV acyclovir for disseminated Zoster and patient refused admission until Wednesday 03/22/20, however swabs returned as negative for VZV from vesicle swab and blood.   - Valtrex 1g TID from 11/1-11/3 with small new lesion on thumb.  With negative viral swab and longevity of symptoms less likely infectious cause.   - Possibly dermatitis?  Pt will monitor and we have now referred to dermatology and sent swab for HSV.  Will recommend starting daily Zyrtec.   - No new lesions 11/16  Old lesions are healing.     Diarrhea: New. Will tx with IVF and electrolytes. Will test stool for c.diff. and Gi pathogen panel.   ??  Neutropenic Fever:  02/03/20: Tmax 38.4 Cefepime (02/03/20 - 02/09/20)  - Cultures NGx 5 days  ??  02/13/20: Spiked again at 5am 9/26  - Cultures negative at 5 days  - Started on IV cefepime (9/26- 10/1) and IV  flagyl (anaerobic coverage given mucositis) (9/26-10/1). Received a dose of IV vanc but no indication to continue.   ??  Allergy:  - PCN allergy (hives) has tolerated Cefepime without issues    Seasonal Allergies:  - Flonase and Claritin daily prn.   ??  EBV:  - IgM Ab + 01/05/20 but viral load 01/05/20 not detected.   - Viral Load 10/25, not detected.    Full virals sent 11/9 given mild thrombocytopenia and rash this panel was negative.    GI:??  GERD prophylaxis:  - has now stopped pepcid and doing well off this  ??  Renal:   AKI:   -04/05/20: AKI  Worse and up to 2 today.    -04/07/20: AKI improve to 1.7.    Continue to hold OTC diuretics  I imagine he will just 3rd space fluids if we gave it to him.  So for now we are going to hold some meds as below to see if this helps his overall picture.     FEN:   Hyperkalemia:  Currently <5        Hypomagnesemia  - Mg 1.6 today.   ??  Hepatic:??  - No active issues.   - VOD prophylaxis with Actigall TID.     CV:    HTN: Likely tac induced  - Now resolved, currently not on any antihypertensives.     Pulm: DLCO 62%  - Former smoker, quit 01/12/20. Using nicotine patch currently. Has chronic dry cough but PFTs were acceptable.   07/24/19: CT chest w/ 0.6cm RUL nodules, no clear etiology.     ** Discussed with Dr. Oswald Hillock prior to transplant, ok to move forward, no plans to repeat unless new findings.       Neuro/Pain:??  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     Psychosocial:??  - Substance abuse (opioid history). Sober for 5 years.  - Followed by Frederik Schmidt in [facility], placed referral for [facility] while admitted, are not seeing inpatients, will follow- up with him post discharge.   - Insomnia: Continue Trazodone 50mg  nightly prn.     ** Not taking melatonin.   - Anxiety: Hospitalization related. Well controlled since discharge.     ** Klonopin 1mg  nightly-Discontinued 03/15/20 as not taking it any more    Bone health:  Dexa scan from 10/13 resulted and shows low bone density.   - Seen by pharmacy and discussed zometa and calcium supplemenation 10/22  - Zometa first dose on 03/23/20    ** He has a dental appointment Monday 11/1 but just for impressions for partial implant (it is a non-invasive dental procedure as the implants are clip on with a retainer, not surgically attached, so ok to proceed with Zometa)   ??  Summary:   Unclear of causes of edema. There is some mention of edema with prevymis.  Will continue holding Dapsone, fluconazole, Prevemis and Actigall.   Test stool for c.diff and GI pathogen panel.   IVF today and likely tomorrow.  Replete electrolytes.   Patient requested Rx for phenergan as needed.     I spoke with the patient and his girlfriend that he needs to notify us immediately about any new symptoms not just fever. He should have never stayed at home with diarrhea  for several days without letting us know.     Lanae Boast, MD  Associate Professor  Hematology BMTCTP    I personally spent 53 minutes face-to-face and non-face-to-face in the care of this patient, which includes all pre, intra, and post visit time on the date of service.

## 2020-04-18 NOTE — Unmapped (Signed)
Patient arrived to chair 5.  No complaints noted.  Access of CVC intact with blood return.  Patient receiving IVF bolus and mag and potassium replacement per therapy plan.    Patient completed and tolerated treatment.  AVS declined and patient discharged to home.

## 2020-04-18 NOTE — Unmapped (Signed)
If you feel like this is an emergency please call 911.  For appointments or questions Monday through Friday 8AM-5PM please call [phone] or Toll Free [phone]. For Medical questions or concerns ask for the Nurse Triage Line.  On Nights, Weekends, and Holidays call [phone] and ask for the Oncologist on Call.  Reasons to call the Nurse Triage Line:  Fever of 100.5 or greater  Nausea and/or vomiting not relived with nausea medicine  Diarrhea or constipation  Severe pain not relieved with usual pain regimen  Shortness of breath  Uncontrolled bleeding  Mental status changes    Lab on 04/18/2020   Component Date Value Ref Range Status   ??? Sodium 04/18/2020 138  135 - 145 mmol/L Final   ??? Potassium 04/18/2020 3.3* 3.4 - 4.5 mmol/L Final   ??? Chloride 04/18/2020 104  98 - 107 mmol/L Final   ??? Anion Gap 04/18/2020 8  5 - 14 mmol/L Final   ??? CO2 04/18/2020 26.0  20.0 - 31.0 mmol/L Final   ??? BUN 04/18/2020 13  9 - 23 mg/dL Final   ??? Creatinine 04/18/2020 1.28* 0.60 - 1.10 mg/dL Final   ??? BUN/Creatinine Ratio 04/18/2020 10   Final   ??? EGFR CKD-EPI Non-African American,* 04/18/2020 62  >=60 mL/min/1.47m2 Final   ??? EGFR CKD-EPI African American, Male 04/18/2020 71  >=60 mL/min/1.61m2 Final   ??? Glucose 04/18/2020 116  70 - 179 mg/dL Final   ??? Calcium 57/84/6962 8.7  8.7 - 10.4 mg/dL Final   ??? Albumin 95/28/4132 3.2* 3.4 - 5.0 g/dL Final   ??? Total Protein 04/18/2020 6.1  5.7 - 8.2 g/dL Final   ??? Total Bilirubin 04/18/2020 0.5  0.3 - 1.2 mg/dL Final   ??? AST 44/05/270 18  <=34 U/L Final   ??? ALT 04/18/2020 9* 10 - 49 U/L Final   ??? Alkaline Phosphatase 04/18/2020 63  46 - 116 U/L Final   ??? Magnesium 04/18/2020 1.5* 1.6 - 2.6 mg/dL Final   ??? WBC 53/66/4403 4.8  4.5 - 11.0 10*9/L Final   ??? RBC 04/18/2020 3.38* 4.50 - 5.90 10*12/L Final   ??? HGB 04/18/2020 12.2* 13.5 - 17.5 g/dL Final   ??? HCT 47/42/5956 37.6* 41.0 - 53.0 % Final   ??? MCV 04/18/2020 111.3* 80.0 - 100.0 fL Final   ??? MCH 04/18/2020 36.1* 26.0 - 34.0 pg Final ??? MCHC 04/18/2020 32.4  31.0 - 37.0 g/dL Final   ??? RDW 38/75/6433 14.6  12.0 - 15.0 % Final   ??? MPV 04/18/2020 12.0* 7.0 - 10.0 fL Final   ??? Platelet 04/18/2020 62* 150 - 440 10*9/L Final   ??? Neutrophils % 04/18/2020 74.6  % Final   ??? Lymphocytes % 04/18/2020 10.2  % Final   ??? Monocytes % 04/18/2020 9.5  % Final   ??? Eosinophils % 04/18/2020 0.2  % Final   ??? Basophils % 04/18/2020 0.2  % Final   ??? Absolute Neutrophils 04/18/2020 3.6  2.0 - 7.5 10*9/L Final   ??? Absolute Lymphocytes 04/18/2020 0.5* 1.5 - 5.0 10*9/L Final   ??? Absolute Monocytes 04/18/2020 0.5  0.2 - 0.8 10*9/L Final   ??? Absolute Eosinophils 04/18/2020 0.0  0.0 - 0.4 10*9/L Final   ??? Absolute Basophils 04/18/2020 0.0  0.0 - 0.1 10*9/L Final   ??? Large Unstained Cells 04/18/2020 5* 0 - 4 % Final   ??? Macrocytosis 04/18/2020 Marked* Not Present Final   ??? Hypochromasia 04/18/2020 Slight* Not Present Final

## 2020-04-19 ENCOUNTER — Encounter: Admit: 2020-04-19 | Discharge: 2020-04-19 | Payer: PRIVATE HEALTH INSURANCE

## 2020-04-19 ENCOUNTER — Other Ambulatory Visit: Admit: 2020-04-19 | Discharge: 2020-04-19 | Payer: PRIVATE HEALTH INSURANCE

## 2020-04-19 DIAGNOSIS — E876 Hypokalemia: Principal | ICD-10-CM

## 2020-04-19 DIAGNOSIS — R197 Diarrhea, unspecified: Principal | ICD-10-CM

## 2020-04-19 DIAGNOSIS — C9201 Acute myeloblastic leukemia, in remission: Principal | ICD-10-CM

## 2020-04-19 DIAGNOSIS — Z9484 Stem cells transplant status: Principal | ICD-10-CM

## 2020-04-19 DIAGNOSIS — Z79899 Other long term (current) drug therapy: Principal | ICD-10-CM

## 2020-04-19 DIAGNOSIS — G4700 Insomnia, unspecified: Principal | ICD-10-CM

## 2020-04-19 DIAGNOSIS — Z87891 Personal history of nicotine dependence: Principal | ICD-10-CM

## 2020-04-19 DIAGNOSIS — G5793 Unspecified mononeuropathy of bilateral lower limbs: Principal | ICD-10-CM

## 2020-04-19 DIAGNOSIS — Z48298 Encounter for aftercare following other organ transplant: Principal | ICD-10-CM

## 2020-04-19 DIAGNOSIS — Z880 Allergy status to penicillin: Principal | ICD-10-CM

## 2020-04-19 LAB — CBC W/ AUTO DIFF
BASOPHILS ABSOLUTE COUNT: 0 10*9/L (ref 0.0–0.1)
BASOPHILS RELATIVE PERCENT: 0.1 %
EOSINOPHILS ABSOLUTE COUNT: 0.1 10*9/L (ref 0.0–0.4)
EOSINOPHILS RELATIVE PERCENT: 2.2 %
HEMATOCRIT: 33.3 % — ABNORMAL LOW (ref 41.0–53.0)
HEMOGLOBIN: 10.8 g/dL — ABNORMAL LOW (ref 13.5–17.5)
LARGE UNSTAINED CELLS: 4 % (ref 0–4)
LYMPHOCYTES ABSOLUTE COUNT: 0.4 10*9/L — ABNORMAL LOW (ref 1.5–5.0)
LYMPHOCYTES RELATIVE PERCENT: 8.9 %
MEAN CORPUSCULAR HEMOGLOBIN CONC: 32.4 g/dL (ref 31.0–37.0)
MEAN CORPUSCULAR HEMOGLOBIN: 35.9 pg — ABNORMAL HIGH (ref 26.0–34.0)
MEAN CORPUSCULAR VOLUME: 110.8 fL — ABNORMAL HIGH (ref 80.0–100.0)
MEAN PLATELET VOLUME: 11.8 fL — ABNORMAL HIGH (ref 7.0–10.0)
MONOCYTES ABSOLUTE COUNT: 0.4 10*9/L (ref 0.2–0.8)
MONOCYTES RELATIVE PERCENT: 8.3 %
NEUTROPHILS ABSOLUTE COUNT: 3.6 10*9/L (ref 2.0–7.5)
NEUTROPHILS RELATIVE PERCENT: 76.1 %
PLATELET COUNT: 65 10*9/L — ABNORMAL LOW (ref 150–440)
RED BLOOD CELL COUNT: 3 10*12/L — ABNORMAL LOW (ref 4.50–5.90)
RED CELL DISTRIBUTION WIDTH: 14.1 % (ref 12.0–15.0)
WBC ADJUSTED: 4.7 10*9/L (ref 4.5–11.0)

## 2020-04-19 LAB — COMPREHENSIVE METABOLIC PANEL
ALBUMIN: 2.9 g/dL — ABNORMAL LOW (ref 3.4–5.0)
ALKALINE PHOSPHATASE: 61 U/L (ref 46–116)
ALT (SGPT): 7 U/L — ABNORMAL LOW (ref 10–49)
ANION GAP: 7 mmol/L (ref 5–14)
AST (SGOT): 25 U/L (ref <=34)
BILIRUBIN TOTAL: 0.3 mg/dL (ref 0.3–1.2)
BLOOD UREA NITROGEN: 14 mg/dL (ref 9–23)
BUN / CREAT RATIO: 12
CALCIUM: 8.4 mg/dL — ABNORMAL LOW (ref 8.7–10.4)
CHLORIDE: 107 mmol/L (ref 98–107)
CO2: 25 mmol/L (ref 20.0–31.0)
CREATININE: 1.19 mg/dL — ABNORMAL HIGH
EGFR CKD-EPI AA MALE: 78 mL/min/{1.73_m2} (ref >=60)
EGFR CKD-EPI NON-AA MALE: 67 mL/min/{1.73_m2} (ref >=60)
GLUCOSE RANDOM: 123 mg/dL (ref 70–179)
POTASSIUM: 3.2 mmol/L — ABNORMAL LOW (ref 3.4–4.5)
PROTEIN TOTAL: 5.5 g/dL — ABNORMAL LOW (ref 5.7–8.2)
SODIUM: 139 mmol/L (ref 135–145)

## 2020-04-19 LAB — TACROLIMUS LEVEL, TROUGH
TACROLIMUS, TROUGH: 2.7 ng/mL — ABNORMAL LOW (ref 5.0–15.0)
TACROLIMUS, TROUGH: 2.7 ng/mL — ABNORMAL LOW (ref 5.0–15.0)

## 2020-04-19 LAB — CMV DNA, QUANTITATIVE, PCR: CMV VIRAL LD: NOT DETECTED

## 2020-04-19 LAB — MAGNESIUM: MAGNESIUM: 1.6 mg/dL (ref 1.6–2.6)

## 2020-04-19 MED ORDER — DAPSONE 100 MG TABLET
ORAL_TABLET | Freq: Every day | ORAL | 5 refills | 30 days | Status: CP
Start: 2020-04-19 — End: 2020-10-16

## 2020-04-19 MED ORDER — PREGABALIN 75 MG CAPSULE
ORAL_CAPSULE | 0 refills | 0.00000 days | Status: CN
Start: 2020-04-19 — End: ?

## 2020-04-19 MED ADMIN — heparin, porcine (PF) 100 unit/mL injection 200 Units: 200 [IU] | INTRAVENOUS | @ 15:00:00 | Stop: 2020-04-19

## 2020-04-19 NOTE — Unmapped (Addendum)
Instructions:   - A BMT pharmacist will call you if I need to make changes to your tacrolimus dose.   - Pick up Dapsone.  This will prevent a type of pneumonia that transplant pts are susceptible to.  It is one pill a day and is at [facility].   - Restart Prevymis once daily to prevent CMV virus reactivation  - We will wait until next visit to restart Actigall   - We will call if anything comes up on the stool sample    Return to clinic on Friday to see one of the providers.     Covid:   Given ongoing novel coronavirus (COVID-19), it is strongly recommended to avoid travel and crowds.  If you develop fever, cough, shortness of breath and/or have known exposure (close contact < 3 feet) with someone who has tested positive, please notify us immediately.    ??? Your best defense is to stay home as much as possible and use good hand hygiene.    For prescription refills:   For refills, please check your medication bottles to see if you have additional refills left. If so, please call your pharmacy and follow the directions to request a refill. If you do not have any refills left, please make a request during your clinic visit or by submitting a request through MyChart or by calling [phone]. Please allow 24 hours if your request is made during the week or 48 hours if requests are made on the weekends or holidays.     Labs:   Lab Results   Component Value Date    WBC 4.7 04/19/2020    HGB 10.8 (L) 04/19/2020    HCT 33.3 (L) 04/19/2020    PLT 65 (L) 04/19/2020     Lab Results   Component Value Date    NA 139 04/19/2020    K 3.2 (L) 04/19/2020    CL 107 04/19/2020    CO2 25.0 04/19/2020    BUN 14 04/19/2020    CREATININE 1.19 (H) 04/19/2020    GLU 123 04/19/2020    CALCIUM 8.4 (L) 04/19/2020    MG 1.6 04/19/2020    PHOS 4.1 02/22/2020     Lab Results   Component Value Date    BILITOT 0.3 04/19/2020    BILIDIR 0.30 02/21/2020    PROT 5.5 (L) 04/19/2020    ALBUMIN 2.9 (L) 04/19/2020    ALT 7 (L) 04/19/2020    AST 25 04/19/2020 ALKPHOS 61 04/19/2020    GGT 21 01/26/2020     Lab Results   Component Value Date    INR 1.21 02/22/2020    APTT 33.2 02/17/2020       --------------------------------------------------------------------------------------------------------------------  For appointments & questions Monday through Friday 8 AM-4:30 PM     Please call [phone] or Toll free [phone]    On Nights, Weekends and Holidays  Call [phone] and ask for the oncologist on call    Please visit PrivacyF[employer]cz, a resource created just for family members and caregivers.  This website lists support services, how and where to ask for help. It has tools to assist you as you help [place] care for your loved one.    [facility]  [address]  [place], [place] [address]  [email or web address]

## 2020-04-19 NOTE — Unmapped (Signed)
[facility] Clinical Assessment & Refill Coordination Note    John Erickson, DOB: [birth year]  Phone: [phone] (home)     All above HIPAA information was verified with patient.     Was a Nurse, learning disability used for this call? No    Specialty Medication(s):   Infectious Disease: Prevymis     Current Outpatient Medications   Medication Sig Dispense Refill   ??? calcium carbonate (OS-CAL) 1,500 mg (600 mg elem calcium) tablet Take 1 tablet by mouth Two (2) times a day.     ??? cholecalciferol, vitamin D3-25 mcg, 1,000 unit,, (VITAMIN D3) 25 mcg (1,000 unit) capsule Take 25 mcg by mouth daily.      ??? dapsone 100 MG tablet Take 1 tablet (100 mg total) by mouth daily. 30 tablet 5   ??? letermovir (PREVYMIS) 480 mg tablet Take 1 tablet (480 mg total) by mouth daily. 28 tablet 2   ??? loratadine (CLARITIN) 10 mg tablet Take 1 tablet (10 mg total) by mouth daily. 30 tablet 2   ??? magnesium oxide-Mg AA chelate (MAGNESIUM, AMINO ACID CHELATE,) 133 mg Tab Take 1 tablet by mouth every morning before breakfast. 270 tablet 3   ??? ondansetron (ZOFRAN) 8 MG tablet Take 1 tablet (8 mg total) by mouth every eight (8) hours as needed for nausea. 30 tablet 2   ??? pregabalin (LYRICA) 75 MG capsule Take 75mg  (1 capsule) in the AM and 150mg  (2 capsules) in the PM 90 capsule 0   ??? promethazine (PHENERGAN) 25 MG tablet Take 1 tablet (25 mg total) by mouth every six (6) hours as needed for nausea. 30 tablet 1   ??? tacrolimus (PROGRAF) 0.5 MG capsule Take 2 capsules (1 mg total) by mouth two (2) times a day. 120 capsule 5   ??? traZODone (DESYREL) 50 MG tablet Take 1 tablet (50 mg total) by mouth nightly as needed for sleep (insomnia). 90 tablet 3   ??? ursodioL (ACTIGALL) 300 mg capsule Take 1 capsule (300 mg total) by mouth Three (3) times a day. 90 capsule 11   ??? valACYclovir (VALTREX) 500 MG tablet Take 1 tablet (500 mg total) by mouth daily. 30 tablet 0     No current facility-administered medications for this visit. Facility-Administered Medications Ordered in Other Visits   Medication Dose Route Frequency Provider Last Rate Last Admin   ??? heparin, porcine (PF) 100 unit/mL injection 200 Units  200 Units Intravenous Q30 Min PRN Artelia Laroche, MD   200 Units at 04/19/20 [phone]        Changes to medications: John Erickson reports starting the following medications: phenergan for nausea and dose increased for pregabalin    Allergies   Allergen Reactions   ??? Penicillins Hives   ??? Sulfa (Sulfonamide Antibiotics) Other (See Comments) and Rash     Unknown    Other reaction(s): Unknown       Changes to allergies: No    SPECIALTY MEDICATION ADHERENCE     Prevymis 480 mg: 5 days of medicine on hand   Tacrolimus 0.5 mg: plenty on hand - current dose reported as 1 mg BID      Medication Adherence    Patient reported X missed doses in the last month: 0  Specialty Medication: Tac 0.5 mg - 1 cap BID  Patient is on additional specialty medications: Yes  Additional Specialty Medications: letermovir 480 mg daily - was held.  Now restarting  Patient Reported Additional Medication X Missed Doses in  the Last Month: 0  Patient is on more than two specialty medications: No  Informant: patient  Reasons for non-adherence: instructed by provider to hold or take differently  Confirmed plan for next specialty medication refill: delivery by pharmacy          Specialty medication(s) dose(s) confirmed: Regimen is correct and unchanged.     Are there any concerns with adherence? No    Adherence counseling provided? Not needed    CLINICAL MANAGEMENT AND INTERVENTION      Clinical Benefit Assessment:    Do you feel the medicine is effective or helping your condition? Yes    Clinical Benefit counseling provided? Not needed    Adverse Effects Assessment:    Are you experiencing any side effects? No    Are you experiencing difficulty administering your medicine? No    Quality of Life Assessment:    How many days over the past month did your Allogeneic stem cell transplant  keep you from your normal activities? For example, brushing your teeth or getting up in the morning. 0    Have you discussed this with your provider? Not needed    Therapy Appropriateness:    Is therapy appropriate? Yes, therapy is appropriate and should be continued    DISEASE/MEDICATION-SPECIFIC INFORMATION      N/A    PATIENT SPECIFIC NEEDS     - Does the patient have any physical, cognitive, or cultural barriers? No    - Is the patient high risk? No    - Does the patient require a Care Management Plan? No     - Does the patient require physician intervention or other additional services (i.e. nutrition, smoking cessation, social work)? No      SHIPPING     Specialty Medication(s) to be Shipped:   Infectious Disease: Prevymis    Other medication(s) to be shipped: pregabalin (ref req) - pt aware signature required     Changes to insurance: No    Delivery Scheduled: Yes, Expected medication delivery date: 04/21/20.  However, Rx request for refills was sent to the provider as there are none remaining.     Medication will be delivered via Same Day Courier to the confirmed prescription address in [facility].    The patient will receive a drug information handout for each medication shipped and additional FDA Medication Guides as required.  Verified that patient has previously received a Conservation officer, historic buildings.    All of the patient's questions and concerns have been addressed.    Breck Coons Shared [facility] Specialty Pharmacist

## 2020-04-19 NOTE — Unmapped (Signed)
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  Transplant was tolerated relatively well and primarily complicated by mucositis requiring PCA.    Interval History:     John Erickson presents with his fiance to clinic for follow up and to drop off a stool sample.  He has had one stool since leaving clinic yesterday.  He was able to eat soup last night and strawberry shortcake and some banana and yogurt this morning with orange juice.  No abdominal pain but a little cramping this morning with his stool.  No rash or itching and previous vesicular rash has entirely resolved.  Feeling much better since yesterday and energy is doing good.      LE is much better and no facial swelling any longer.  He has been holding Dapsone, Fluconazole, Prevymis, and Actigall.  Now that he is 77 days from transplant he does not need to restart the fluconazole, and reports that he never actually picked up the Dapsone.      ROS:  A comprehensive ROS performed and is negative except for pertinent positives as listed above in interval history.     I reviewed and updated past medical, surgical, social, and family history as appropriate.     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,XY[1]    Variants of Known or Likely Clinical Significance  Gene Transcript  Predicted Protein  VAF (%)   CEBPA c.[id number] p.Ser251Argfs 41      Variants of Unknown Significance  Gene Transcript Predicted Protein VAF (%)   TET2 c.4946A>G p.Tyr1649Cys 49      -  FLT-3-ITD and FLT-3-TKD studies are negative    Pertinent Phenotypic data:    Disease-specific prognostic estimate:     Induction:   7+3+HD Dauno (C1D1 2/24)    Recovery Marrow:    Final Diagnosis   Date Value Ref Range Status   08/12/2019   Final    Bone marrow, right iliac, aspiration and biopsy  -  Normocellular bone marrow (60%) with trilineage hematopoiesis and 4% blasts by manual aspirate differential  -  Routine cytogenetic analysis is pending  -  Flow cytometric MRD analysis is pending    This electronic signature is attestation that the pathologist personally reviewed the submitted material(s) and the final diagnosis reflects that evaluation.            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           Molecular Genetics: No results  found for: MYELOIDMP       AML (acute myeloid leukemia) (CMS-HCC)   07/09/2019 Biopsy    Bone marrow, left iliac, aspiration and biopsy  -  Hypercellular bone marrow (95%) involved by acute myeloid leukemia (42% blasts by manual aspirate differential)   Abnormal Karyotype: 46,Y,t(X;8)(q26;q11.2)[19]/46,XY[1]  Variants of Known or Likely Clinical Significance  Gene Transcript  Predicted Protein  VAF (%)   CEBPA c.753_762del p.Ser251Argfs 41      Variants of Unknown Significance  Gene Transcript Predicted Protein VAF (%)   TET2 c.4946A>G p.Tyr1649Cys 49      -  FLT-3-ITD and FLT-3-TKD studies are negative     07/09/2019 Initial Diagnosis    AML (acute myeloid leukemia) (CMS-HCC)     07/14/2019 - 07/20/2019 Chemotherapy    IP LEUKEMIA 7+3 HIGH DOSE DAUNOrubicin  DAUNOrubicin 90 mg/m2 IV on Days 1, 2, 3  Cytarabine 100 mg/m2 CIVI on Days 1 to 7     07/27/2019 Biopsy    Bone marrow, right iliac, aspiration and biopsy  -  Hypocellular bone marrow (<5%) with treatment effect, markedly reduced trilineage hematopoiesis, and 3% blasts by manual aspirate differential (see Comment).     08/24/2019 - 09/28/2019 Chemotherapy    IP LEUKEMIA HIGH DOSE CYTARABINE CONSOLIDATION (3 G/M2)  cytarabine 3 g/m2 every 12 hours on days 1, 3 and 5 every 28 days     08/24/2019 - 12/03/2019 Chemotherapy    IP LEUKEMIA HIGH DOSE CYTARABINE CONSOLIDATION (3 G/M2) ON DAYS 1,2,3  cytarabine 3 g/m2 IV every 12 hours on days 1, 2, 3, every 28 days     01/19/2020 - 01/19/2020 Chemotherapy    BMT OP BUSULFAN TEST DOSE  Busulfan 0.8 mg/kg IV ONCE     01/26/2020 -  Chemotherapy    BMT IP MAC BUSULFAN / FLUDARABINE / rATG (MUD)   Busulfan IV Days -6 to -3  Fludarabine 40 mg/m2 IV Days -6 to -3  rATG 0.5 mg/kg IV Day -3, 1.5 mg/kg IV Day -2, 2.5 mg/kg IV Day -1       Physical exam:  Temp:  [36.8 ??C (98.3 ??F)-36.9 ??C (98.5 ??F)] 36.9 ??C (98.5 ??F)  Heart Rate:  [89-100] 89  BP: (131-156)/(85-101) 131/85   70, Cares for self; unable to carry on normal activity or to do active work (ECOG equivalent 1)    General: No acute distress noted.   Central Venous Access: Line c/d/i; No erythema, TTP, or drainage noted. Port not accesssed.   ENT: Moist mucous membranes. Oropharhynx without lesions, erythema or exudate.   Cardiovascular: Pulse normal rate, regularity and rhythm. S1 and S2 normal, without any murmur, rub, or gallop.  Lungs: Clear to auscultation bilaterally, without wheezes/crackles/rhonchi. Good air movement.   Skin: No new lesions. No rash or abrasions.  Psychiatry: Alert and oriented to person, place, and time.   Gastrointestinal/Abdomen: Normoactive bowel sounds, abdomen soft, non-tender   Musculoskeletal/Extremities: FROM throughout. Trace BLE edema and no facial swelling.    Neurologic: CNII-XII intact. Normal strength and sensation throughout    Labs:  I reviewed all labs from today in Epic. See EMR for lab results.    Lab Results   Component Value Date    WBC 4.8 04/18/2020    HGB 12.2 (L) 04/18/2020    HCT 37.6 (L) 04/18/2020    PLT 62 (L) 04/18/2020     Lab Results   Component Value Date    NA 138 04/18/2020    K 3.3 (L) 04/18/2020  CL 104 04/18/2020    CO2 26.0 04/18/2020    BUN 13 04/18/2020    CREATININE 1.28 (H) 04/18/2020    GLU 116 04/18/2020    CALCIUM 8.7 04/18/2020    MG 1.5 (L) 04/18/2020    PHOS 4.1 02/22/2020     Lab Results   Component Value Date    BILITOT 0.5 04/18/2020    BILIDIR 0.30 02/21/2020    PROT 6.1 04/18/2020    ALBUMIN 3.2 (L) 04/18/2020    ALT 9 (L) 04/18/2020    AST 18 04/18/2020    ALKPHOS 63 04/18/2020    GGT 21 01/26/2020     Lab Results   Component Value Date    PT 14.1 (H) 02/22/2020    INR 1.21 02/22/2020    APTT 33.2 02/17/2020     Assessment and Plan    BMT:??AML, CR MRD Negative  HCT-CI (age adjusted) 7??(psych, severe pulmonary dysfunction, and age)   Conditioning:??MAC Bu/Flu/ATG  Donor:??9/10, ABO O+, CMV+  Engraftment:??Date of last granix injection: 02/19/20    Chimerism:   - 04/07/20: 94% donor in CD3+ and >95% donor in UF.  Has Day 90 BmBx scheduled for 12/8.      GVHD prophylaxis:??  1.Tacrolimus being managed by pharmacy with a goal of 5-10 ng/mL  2. Methotrexate??15 mg/m2 IVP on day +1 then 10 mg/m2 on days +3, +6 and +11  3. ATG per [facility] standard dosing was included  ??  Heme:??  Transfusion criteria:??1 unit of PRBCs for Hgb<7 and 1 unit of platelets for Plt <10K or bleeding.   -??No history of transfusion reactions.   -Plts decreased, but now slowly improving.  Possibly related to whichever medication caused edema.     ID:??  Prophylaxis:  - Antiviral: Valtrex 500mg  daily   - Antifungal: None  - PJP: Begin Dapsone 100mg  PO daily (has Sulfa allergy) on 12/1  - CMV D+/R+: Restart Letermovir prophy on 12/1, 480mg  po daily, Continue through D+100.  *Monitor for swelling after restart.    Vesicular lesions:  - Initial AC fossa lesion 10/25, lesions were migratory and vesicular in a linear fashion  - Swab and blood both negative for VZV or HSV.  Trial of treatment dose Valtex did not change rash.    - As of 11/16 lesions resolved and no further rash.   ??  Allergy:  - PCN allergy (hives) has tolerated Cefepime without issues    Seasonal Allergies:  - Flonase and Claritin daily prn.   ??  EBV:  - IgM Ab + 01/05/20 but viral load 01/05/20 not detected.   - Viral Load 10/25, not detected.    *Full virals sent 11/9 given mild thrombocytopenia and rash this panel was negative.  ??  Renal:   AKI:   -04/05/20: AKI  Worse and max up to 2 today.  Was taking OTC diuretic to help with swelling.   - 04/19/20: Creatinine returning to baseline now that he has stopped diuretic and is drinking well.     FEN:   Hypokalemia: Mild, 3.2 today.  Will monitor but has hx of hyperkalemia so will hold off on supplements.      Hypomagnesemia  - Mag Chelate 1 tablet daily.   ??  Hepatic:??  - No active issues.   - VOD prophylaxis with Actigall TID on hold since 11/17 due to swelling/rash.  If doing well after starting Prevymis at next visit will restart Actigall.     Pulm: DLCO 62%  - Former  smoker, quit 01/12/20. Using nicotine patch currently. Has chronic dry cough but PFTs were acceptable.   07/24/19: CT chest w/ 0.6cm RUL nodules, no clear etiology.     ** Discussed with Dr. Oswald Hillock prior to transplant, ok to move forward, no plans to repeat unless new findings.       Neuro/Pain:??  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     Psychosocial:??  - Substance abuse (opioid history). Sober for 5 years.  - Followed by Frederik Schmidt in [facility], placed referral for [facility] while admitted, are not seeing inpatients, will follow- up with him post discharge.   - Insomnia: Continue Trazodone 50mg  nightly prn.     Bone health:  Dexa scan from 10/13 resulted and shows low bone density.   - Seen by pharmacy and discussed zometa and calcium supplemenation 10/22  - Zometa first dose on 03/23/20.  Will need to monitor for recurrence of swelling when we give second dose since it occurred right after 1st dose.    ** He has a dental appointment 12/9 but just for impressions for partial implant (it is a non-invasive dental procedure as the implants are clip on with a retainer, not surgically attached, so ok to proceed with Zometa)   ??  Summary:   - No need to restart Fluconazole  - Restart Prevymis  - Begin Dapsone  - Continue to hold Actigall till Friday to make sure he is not having swelling with Prevymis restart  - Will call if stool sample yields any results, but symptoms have entirely resolved from diarrhea this past weekend  - RTC Friday with APP and if doing well can return the following Wednesday for weekly visits    Megham Dwyer Elie Confer, [facility]  Physician Assistant  Adult Bone Marrow Transplantation    I personally spent 76 minutes face-to-face and non-face-to-face in the care of this patient, which includes all pre, intra, and post visit time on the date of service.

## 2020-04-20 DIAGNOSIS — A0472 Enterocolitis due to Clostridium difficile, not specified as recurrent: Principal | ICD-10-CM

## 2020-04-20 LAB — EBV QUANTITATIVE PCR, BLOOD: EBV VIRAL LOAD RESULT: NOT DETECTED

## 2020-04-20 MED ORDER — VANCOMYCIN 125 MG CAPSULE: 125 mg | capsule | Freq: Four times a day (QID) | 0 refills | 10 days | Status: AC

## 2020-04-20 MED ORDER — VANCOMYCIN 125 MG CAPSULE
ORAL_CAPSULE | Freq: Four times a day (QID) | ORAL | 0 refills | 10.00000 days | Status: CP
Start: 2020-04-20 — End: 2020-05-03
  Filled 2020-04-20: qty 40, 10d supply, fill #0

## 2020-04-20 MED FILL — VANCOMYCIN 125 MG CAPSULE: 10 days supply | Qty: 40 | Fill #0 | Status: AC

## 2020-04-20 NOTE — Unmapped (Signed)
Bone Marrow Transplant and Cellular Therapy Program  Immunosuppressive Therapy Note    [name] is a 59 y.o. 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 [name] is currently day +77.    Current dose: tacrolimus 1 mg PO BID (dose increased on 11/23)    Goal tacrolimus Level: 5-10 ng/mL    Resulted level: 2.7 ng/mL (fiancee states that patient has had diarrhea and missed few doses due to feeling sick to his stomach)    Lab Results   Component Value Date/Time    TACROLIMUS 2.7 (L) 04/19/2020 09:50 AM    TACROLIMUS 2.7 (L) 04/18/2020 12:51 PM    TACROLIMUS 3.7 (L) 04/11/2020 08:28 AM     Lab Results   Component Value Date/Time    CREATININE 1.19 (H) 04/19/2020 09:50 AM    CREATININE 1.28 (H) 04/18/2020 12:51 PM    CREATININE 1.22 (H) 04/11/2020 08:28 AM     Recommendation: Tacrolimus level is low despite recent dose increases. Of note patient has had diarrhea and upset stomach over the past few days. C diff positive from yesterday. Plan to start vancomycin 125 mg PO QID x 10 days to treat C diff. LFTs stable/WNL. Anticipate that tacrolimus level will trend up as we treat C diff and diarrhea improves. In the mean time, instructed fiancee to give him tacrolimus 1.5 mg this evening and then continue tacrolimus to 1 mg PO BID and recheck tacrolimus level and renal function in clinic tomorrow. Fiancee verbalized understanding of the above plan and confirmed that they will pick up vancomycin to start taking today.       We will continue to monitor levels. Patient will be followed for changes in renal and hepatic function, toxicity, and efficacy.     Rulon Abide, PharmD BCOP  BMT Clinical Pharmacist Practitioner

## 2020-04-21 ENCOUNTER — Encounter: Admit: 2020-04-21 | Discharge: 2020-04-21 | Payer: PRIVATE HEALTH INSURANCE

## 2020-04-21 DIAGNOSIS — C9201 Acute myeloblastic leukemia, in remission: Principal | ICD-10-CM

## 2020-04-21 DIAGNOSIS — Z880 Allergy status to penicillin: Principal | ICD-10-CM

## 2020-04-21 DIAGNOSIS — G5793 Unspecified mononeuropathy of bilateral lower limbs: Principal | ICD-10-CM

## 2020-04-21 DIAGNOSIS — Z79899 Other long term (current) drug therapy: Principal | ICD-10-CM

## 2020-04-21 DIAGNOSIS — Z5181 Encounter for therapeutic drug level monitoring: Principal | ICD-10-CM

## 2020-04-21 DIAGNOSIS — Z9484 Stem cells transplant status: Principal | ICD-10-CM

## 2020-04-21 DIAGNOSIS — Z9481 Bone marrow transplant status: Principal | ICD-10-CM

## 2020-04-21 DIAGNOSIS — C9200 Acute myeloblastic leukemia, not having achieved remission: Principal | ICD-10-CM

## 2020-04-21 DIAGNOSIS — R197 Diarrhea, unspecified: Principal | ICD-10-CM

## 2020-04-21 LAB — CBC W/ AUTO DIFF
BASOPHILS ABSOLUTE COUNT: 0 10*9/L (ref 0.0–0.1)
BASOPHILS RELATIVE PERCENT: 0.2 %
EOSINOPHILS ABSOLUTE COUNT: 0.2 10*9/L (ref 0.0–0.4)
EOSINOPHILS RELATIVE PERCENT: 5.3 %
HEMATOCRIT: 34.6 % — ABNORMAL LOW (ref 41.0–53.0)
HEMOGLOBIN: 11.2 g/dL — ABNORMAL LOW (ref 13.5–17.5)
LARGE UNSTAINED CELLS: 3 % (ref 0–4)
LYMPHOCYTES ABSOLUTE COUNT: 0.5 10*9/L — ABNORMAL LOW (ref 1.5–5.0)
LYMPHOCYTES RELATIVE PERCENT: 10.7 %
MEAN CORPUSCULAR HEMOGLOBIN CONC: 32.5 g/dL (ref 31.0–37.0)
MEAN CORPUSCULAR HEMOGLOBIN: 36.7 pg — ABNORMAL HIGH (ref 26.0–34.0)
MEAN CORPUSCULAR VOLUME: 112.9 fL — ABNORMAL HIGH (ref 80.0–100.0)
MEAN PLATELET VOLUME: 10.8 fL — ABNORMAL HIGH (ref 7.0–10.0)
MONOCYTES ABSOLUTE COUNT: 0.3 10*9/L (ref 0.2–0.8)
MONOCYTES RELATIVE PERCENT: 6.3 %
NEUTROPHILS ABSOLUTE COUNT: 3.2 10*9/L (ref 2.0–7.5)
NEUTROPHILS RELATIVE PERCENT: 75 %
PLATELET COUNT: 63 10*9/L — ABNORMAL LOW (ref 150–440)
RED BLOOD CELL COUNT: 3.06 10*12/L — ABNORMAL LOW (ref 4.50–5.90)
RED CELL DISTRIBUTION WIDTH: 14.8 % (ref 12.0–15.0)
WBC ADJUSTED: 4.3 10*9/L — ABNORMAL LOW (ref 4.5–11.0)

## 2020-04-21 LAB — COMPREHENSIVE METABOLIC PANEL
ALBUMIN: 3 g/dL — ABNORMAL LOW (ref 3.4–5.0)
ALKALINE PHOSPHATASE: 68 U/L (ref 46–116)
ALT (SGPT): 12 U/L (ref 10–49)
ANION GAP: 5 mmol/L (ref 5–14)
AST (SGOT): 23 U/L (ref <=34)
BILIRUBIN TOTAL: 0.3 mg/dL (ref 0.3–1.2)
BLOOD UREA NITROGEN: 13 mg/dL (ref 9–23)
BUN / CREAT RATIO: 11
CALCIUM: 8.7 mg/dL (ref 8.7–10.4)
CHLORIDE: 111 mmol/L — ABNORMAL HIGH (ref 98–107)
CO2: 26 mmol/L (ref 20.0–31.0)
CREATININE: 1.18 mg/dL — ABNORMAL HIGH
EGFR CKD-EPI AA MALE: 79 mL/min/{1.73_m2} (ref >=60)
EGFR CKD-EPI NON-AA MALE: 68 mL/min/{1.73_m2} (ref >=60)
GLUCOSE RANDOM: 128 mg/dL (ref 70–179)
POTASSIUM: 3.4 mmol/L (ref 3.4–4.5)
PROTEIN TOTAL: 5.5 g/dL — ABNORMAL LOW (ref 5.7–8.2)
SODIUM: 142 mmol/L (ref 135–145)

## 2020-04-21 LAB — MAGNESIUM: MAGNESIUM: 1.5 mg/dL — ABNORMAL LOW (ref 1.6–2.6)

## 2020-04-21 LAB — EBV QUANTITATIVE PCR, BLOOD: EBV VIRAL LOAD RESULT: NOT DETECTED

## 2020-04-21 LAB — TACROLIMUS LEVEL, TROUGH: TACROLIMUS, TROUGH: 4.3 ng/mL — ABNORMAL LOW (ref 5.0–15.0)

## 2020-04-21 MED ORDER — PREGABALIN 75 MG CAPSULE
ORAL_CAPSULE | ORAL | 0 refills | 30.00000 days | Status: CP
Start: 2020-04-21 — End: 2020-05-18
  Filled 2020-04-21: qty 90, 30d supply, fill #0

## 2020-04-21 MED ORDER — TACROLIMUS 0.5 MG CAPSULE, IMMEDIATE-RELEASE
ORAL_CAPSULE | ORAL | 5 refills | 30.00000 days | Status: CP
Start: 2020-04-21 — End: 2020-04-26

## 2020-04-21 MED ADMIN — heparin, porcine (PF) 100 unit/mL injection 200 Units: 200 [IU] | INTRAVENOUS | @ 13:00:00 | Stop: 2020-04-21

## 2020-04-21 MED FILL — PREGABALIN 75 MG CAPSULE: 30 days supply | Qty: 90 | Fill #0 | Status: AC

## 2020-04-21 MED FILL — PREVYMIS 480 MG TABLET: 28 days supply | Qty: 28 | Fill #2 | Status: AC

## 2020-04-21 MED FILL — PREVYMIS 480 MG TABLET: ORAL | 28 days supply | Qty: 28 | Fill #2

## 2020-04-21 NOTE — Unmapped (Signed)
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  Transplant was tolerated relatively well and primarily complicated by mucositis requiring PCA.    Interval History:     John Erickson presents with his fiance to clinic for follow up after being diagnosed yesterday with C.Diff.  He is overall doing really well.     He reports having intermittent nausea and that phenergan is working for him.  He is still having formed stools and had two yesterday.  He is feeling better overall and started vancomycin 12/2.  His appetite is doing better and he is eating better.  No rash.     He restarted Prevymis and began Dapsone for the first time on Wednesday 12/1.  No increase in swelling.  Has not restarted Actigall, but now that he is Day 66 will likely not restart this.      ROS:  A comprehensive ROS performed and is negative except for pertinent positives as listed above in interval history.     I reviewed and updated past medical, surgical, social, and family history as appropriate.     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,XY[1]    Variants of Known or Likely Clinical Significance  Gene Transcript  Predicted Protein  VAF (%)   CEBPA c.[id number] p.Ser251Argfs 41      Variants of Unknown Significance  Gene Transcript Predicted Protein VAF (%)   TET2 c.4946A>G p.Tyr1649Cys 49      - FLT-3-ITD and FLT-3-TKD studies are negative    Pertinent Phenotypic data:    Disease-specific prognostic estimate:     Induction:   7+3+HD Dauno (C1D1 2/24)    Recovery Marrow:    Final Diagnosis   Date Value Ref Range Status   08/12/2019   Final    Bone marrow, right iliac, aspiration and biopsy  -  Normocellular bone marrow (60%) with trilineage hematopoiesis and 4% blasts by manual aspirate differential  -  Routine cytogenetic analysis is pending  -  Flow cytometric MRD analysis is pending    This electronic signature is attestation that the pathologist personally reviewed the submitted material(s) and the final diagnosis reflects that evaluation.            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           Molecular Genetics: No results found for: MYELOIDMP       AML (acute myeloid leukemia) (CMS-HCC)   07/09/2019 Biopsy    Bone marrow, left iliac,  aspiration and biopsy  -  Hypercellular bone marrow (95%) involved by acute myeloid leukemia (42% blasts by manual aspirate differential)   Abnormal Karyotype: 46,Y,t(X;8)(q26;q11.2)[19]/46,XY[1]  Variants of Known or Likely Clinical Significance  Gene Transcript  Predicted Protein  VAF (%)   CEBPA c.753_762del p.Ser251Argfs 41      Variants of Unknown Significance  Gene Transcript Predicted Protein VAF (%)   TET2 c.4946A>G p.Tyr1649Cys 49      -  FLT-3-ITD and FLT-3-TKD studies are negative     07/09/2019 Initial Diagnosis    AML (acute myeloid leukemia) (CMS-HCC)     07/14/2019 - 07/20/2019 Chemotherapy    IP LEUKEMIA 7+3 HIGH DOSE DAUNOrubicin  DAUNOrubicin 90 mg/m2 IV on Days 1, 2, 3  Cytarabine 100 mg/m2 CIVI on Days 1 to 7     07/27/2019 Biopsy    Bone marrow, right iliac, aspiration and biopsy  - Hypocellular bone marrow (<5%) with treatment effect, markedly reduced trilineage hematopoiesis, and 3% blasts by manual aspirate differential (see Comment).     08/24/2019 - 09/28/2019 Chemotherapy    IP LEUKEMIA HIGH DOSE CYTARABINE CONSOLIDATION (3 G/M2)  cytarabine 3 g/m2 every 12 hours on days 1, 3 and 5 every 28 days     08/24/2019 - 12/03/2019 Chemotherapy    IP LEUKEMIA HIGH DOSE CYTARABINE CONSOLIDATION (3 G/M2) ON DAYS 1,2,3  cytarabine 3 g/m2 IV every 12 hours on days 1, 2, 3, every 28 days     01/19/2020 - 01/19/2020 Chemotherapy    BMT OP BUSULFAN TEST DOSE  Busulfan 0.8 mg/kg IV ONCE     01/26/2020 -  Chemotherapy    BMT IP MAC BUSULFAN / FLUDARABINE / rATG (MUD)   Busulfan IV Days -6 to -3  Fludarabine 40 mg/m2 IV Days -6 to -3  rATG 0.5 mg/kg IV Day -3, 1.5 mg/kg IV Day -2, 2.5 mg/kg IV Day -1       Physical exam:  Temp:  [37.1 ??C (98.8 ??F)] 37.1 ??C (98.8 ??F)  Heart Rate:  [86] 86  BP: (126)/(83) 126/83   70, Cares for self; unable to carry on normal activity or to do active work (ECOG equivalent 1)    General: No acute distress noted.   Central Venous Access: Line c/d/i; No erythema, TTP, or drainage noted. Port not accesssed.   ENT: Moist mucous membranes. Oropharhynx without lesions, erythema or exudate.   Cardiovascular: Pulse normal rate, regularity and rhythm. S1 and S2 normal, without any murmur, rub, or gallop.  Lungs: Clear to auscultation bilaterally, without wheezes/crackles/rhonchi. Good air movement.   Skin: No new lesions. No rash or abrasions.  Psychiatry: Alert and oriented to person, place, and time.   Gastrointestinal/Abdomen: Normoactive bowel sounds, abdomen soft, non-tender   Musculoskeletal/Extremities: FROM throughout. Trace BLE edema and no facial swelling.    Neurologic: CNII-XII intact. Normal strength and sensation throughout    Labs:  I reviewed all labs from today in Epic. See EMR for lab results.    Lab Results   Component Value Date    WBC 4.7 04/19/2020    HGB 10.8 (L) 04/19/2020 HCT 33.3 (L) 04/19/2020    PLT 65 (L) 04/19/2020     Lab Results   Component Value Date    NA 139 04/19/2020    K 3.2 (L) 04/19/2020    CL 107 04/19/2020    CO2 25.0 04/19/2020    BUN 14 04/19/2020    CREATININE 1.19 (H) 04/19/2020    GLU 123 04/19/2020  CALCIUM 8.4 (L) 04/19/2020    MG 1.6 04/19/2020    PHOS 4.1 02/22/2020     Lab Results   Component Value Date    BILITOT 0.3 04/19/2020    BILIDIR 0.30 02/21/2020    PROT 5.5 (L) 04/19/2020    ALBUMIN 2.9 (L) 04/19/2020    ALT 7 (L) 04/19/2020    AST 25 04/19/2020    ALKPHOS 61 04/19/2020    GGT 21 01/26/2020     Lab Results   Component Value Date    PT 14.1 (H) 02/22/2020    INR 1.21 02/22/2020    APTT 33.2 02/17/2020     Assessment and Plan    BMT:??AML, CR MRD Negative  HCT-CI (age adjusted) 5??(psych, severe pulmonary dysfunction, and age)   Conditioning:??MAC Bu/Flu/ATG  Donor:??9/10, ABO O+, CMV+  Engraftment:??Date of last granix injection: 02/19/20    Chimerism:   - 04/07/20: 94% donor in CD3+ and >95% donor in UF.  Has Day 90 BmBx scheduled for 12/8.      GVHD prophylaxis:??  1.Tacrolimus being managed by pharmacy with a goal of 5-10 ng/mL  2. Methotrexate??15 mg/m2 IVP on day +1 then 10 mg/m2 on days +3, +6 and +11  3. ATG per John Erickson standard dosing was included  ??  Heme:??  Transfusion criteria:??1 unit of PRBCs for Hgb<7 and 1 unit of platelets for Plt <10K or bleeding.   -??No history of transfusion reactions.   -Plts decreased, but now slowly improving.  Possibly related to whichever medication caused edema or slow engraftment.     ID:??  Prophylaxis:  - Antiviral: Valtrex 500mg  daily   - Antifungal: None  - PJP: Begin Dapsone 100mg  PO daily (has Sulfa allergy) on 12/1  - CMV D+/R+: Restarted Letermovir prophy on 12/1, 480mg  po daily, Continue through D+100.  *Monitor for swelling after restart.    C.Diff:   - Mild symptoms, positive on 12/1  - Oral Vancomycin (12/2- ) plan 10 day course    Vesicular lesions:  - Initial AC fossa lesion 10/25, lesions were migratory and vesicular in a linear fashion  - Swab and blood both negative for VZV or HSV.  Trial of treatment dose Valtex did not change rash.    - As of 11/16 lesions resolved and no further rash.   ??  Allergy:  - PCN allergy (hives) has tolerated Cefepime without issues    Seasonal Allergies:  - Flonase and Claritin daily prn.   ??  EBV:  - IgM Ab + 01/05/20 but viral load 01/05/20 not detected.   - Viral Load 10/25, not detected.    *Full virals sent 11/9 given mild thrombocytopenia and rash this panel was negative.  ??  Renal:   AKI:   -04/05/20: AKI  Worse and max up to 2 today.  Was taking OTC diuretic to help with swelling.   - 04/19/20: Creatinine returning to baseline now that he has stopped diuretic and is drinking well.     FEN:   Hypokalemia: WNL today.  Will monitor but has hx of hyperkalemia so will hold off on supplements.      Hypomagnesemia  - Mag Chelate 1 tablet daily.   ??  Hepatic:??  - No active issues.   - VOD prophylaxis with Actigall TID on hold since 11/17 due to swelling/rash.  Will not restart now that he is already Day 48 and not likely to provide much benefit at this point.     Pulm: DLCO 62%  -  Former smoker, quit 01/12/20. Using nicotine patch currently. Has chronic dry cough but PFTs were acceptable.   07/24/19: CT chest w/ 0.6cm RUL nodules, no clear etiology.     ** Discussed with Dr. Oswald Hillock prior to transplant, ok to move forward, no plans to repeat unless new findings.       Neuro/Pain:??  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     Psychosocial:??  - Substance abuse (opioid history). Sober for 5 years.  - Followed by Frederik Schmidt in [facility], placed referral for [facility] while admitted, are not seeing inpatients, will follow- up with him post discharge.   - Insomnia: Continue Trazodone 50mg  nightly prn.     Bone health:  Dexa scan from 10/13 resulted and shows low bone density.   - Seen by pharmacy and discussed zometa and calcium supplemenation 10/22  - Zometa first dose on 03/23/20.  Will need to monitor for recurrence of swelling when we give second dose since it occurred right after 1st dose.    ** He has a dental appointment 12/9 but just for impressions for partial implant (it is a non-invasive dental procedure as the implants are clip on with a retainer, not surgically attached, so ok to proceed with Zometa)   ??  Summary:   - Continue oral vancomycin  - Day 90 marrow scheduled for next week, after he would like to have line removed prior to christmas if results good and no other issues  - Continue to hold Actigall   - RTC for weekly visits    Higinio Grow Elie Confer, [facility]  Physician Assistant  Adult Bone Marrow Transplantation    I personally spent 45 minutes face-to-face and non-face-to-face in the care of this patient, which includes all pre, intra, and post visit time on the date of service.

## 2020-04-21 NOTE — Unmapped (Signed)
Instructions:   - A BMT pharmacist will call you if I need to make changes to your tacrolimus dose.   - Continue the vancomycin  - Call if any GI sx's get worse  - We will get your biopsy done next week    Return to clinic on Wednesday to see one of the providers.     Covid:   Given ongoing novel coronavirus (COVID-19), it is strongly recommended to avoid travel and crowds.  If you develop fever, cough, shortness of breath and/or have known exposure (close contact < 3 feet) with someone who has tested positive, please notify us immediately.    ??? Your best defense is to stay home as much as possible and use good hand hygiene.    For prescription refills:   For refills, please check your medication bottles to see if you have additional refills left. If so, please call your pharmacy and follow the directions to request a refill. If you do not have any refills left, please make a request during your clinic visit or by submitting a request through MyChart or by calling [phone]. Please allow 24 hours if your request is made during the week or 48 hours if requests are made on the weekends or holidays.     Labs:   Lab Results   Component Value Date    WBC 4.3 (L) 04/21/2020    HGB 11.2 (L) 04/21/2020    HCT 34.6 (L) 04/21/2020    PLT 63 (L) 04/21/2020     Lab Results   Component Value Date    NA 139 04/19/2020    K 3.2 (L) 04/19/2020    CL 107 04/19/2020    CO2 25.0 04/19/2020    BUN 14 04/19/2020    CREATININE 1.19 (H) 04/19/2020    GLU 123 04/19/2020    CALCIUM 8.4 (L) 04/19/2020    MG 1.6 04/19/2020    PHOS 4.1 02/22/2020     Lab Results   Component Value Date    BILITOT 0.3 04/19/2020    BILIDIR 0.30 02/21/2020    PROT 5.5 (L) 04/19/2020    ALBUMIN 2.9 (L) 04/19/2020    ALT 7 (L) 04/19/2020    AST 25 04/19/2020    ALKPHOS 61 04/19/2020    GGT 21 01/26/2020     Lab Results   Component Value Date    INR 1.21 02/22/2020    APTT 33.2 02/17/2020 --------------------------------------------------------------------------------------------------------------------  For appointments & questions Monday through Friday 8 AM-4:30 PM     Please call [phone] or Toll free [phone]    On Nights, Weekends and Holidays  Call [phone] and ask for the oncologist on call    Please visit PrivacyFever.cz, a resource created just for family members and caregivers.  This website lists support services, how and where to ask for help. It has tools to assist you as you help [place] care for your loved one.    [facility]  [address]  [place], [place] [address]  [email or web address]

## 2020-04-21 NOTE — Unmapped (Signed)
Bone Marrow Transplant and Cellular Therapy Program  Immunosuppressive Therapy Note    [name] is a 59 y.o. 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 [name] is currently day +79.    Current dose: tacrolimus 1 mg in AM and 1.5 in PM (dose increased on 12/2)    Goal tacrolimus Level: 5-10 ng/mL    Resulted level: 4.3 ng/mL (fiancee states that they did not increase evening dose of tacrolimus as instructed yesterday)     Lab Results   Component Value Date/Time    TACROLIMUS 4.3 (L) 04/21/2020 07:52 AM    TACROLIMUS 2.7 (L) 04/19/2020 09:50 AM    TACROLIMUS 2.7 (L) 04/18/2020 12:51 PM     Lab Results   Component Value Date/Time    CREATININE 1.18 (H) 04/21/2020 07:52 AM    CREATININE 1.19 (H) 04/19/2020 09:50 AM    CREATININE 1.28 (H) 04/18/2020 12:51 PM     Recommendation: Tacrolimus level is trending up and patient will start increased dose this evening. He is also now on therapy for C diff and diarrhea is slowing down.  LFTs stable/WNL. Given above, plan to continue tacrolimus 1 mg in AM and 1.5 mg in PM and recheck tacrolimus level and renal function in clinic next week.       We will continue to monitor levels. Patient will be followed for changes in renal and hepatic function, toxicity, and efficacy.     Rulon Abide, PharmD BCOP  BMT Clinical Pharmacist Practitioner

## 2020-04-25 NOTE — Unmapped (Signed)
Bone Marrow Transplant and Cellular Therapy Program  Telephone Note    John Erickson  [phone]    Oncologic diagnosis:   AML     Reason for call: Caregiver is ill  [name] and his caregiver Cassie sent a message through our triage inbasket to report that his caregiver (Cassie, significant other) was having GI symptoms after he was diagnosed with C.Diff last week.  His symptoms have resolved prior to beginning treatment and he is now on oral vancomycin.  She reports waking this morning with diarrhea and profuse vomiting.  They had gone to the [facility] ER but had waited 3 hrs and due to feeling so poorly they left and were considering coming to [facility] ER.  She is describing severe abdominal pain in addition to profuse vomiting.      She had called the [facility] ER and was told there was a 4hr wait that she did not think she could tolerate that and was likely going to call for an ambulance to get care urgently.      ROS:  Patient is feeling well    Current Outpatient Medications   Medication Sig Dispense Refill   ??? calcium carbonate (OS-CAL) 1,500 mg (600 mg elem calcium) tablet Take 1 tablet by mouth Two (2) times a day.     ??? cholecalciferol, vitamin D3-25 mcg, 1,000 unit,, (VITAMIN D3) 25 mcg (1,000 unit) capsule Take 25 mcg by mouth daily.      ??? dapsone 100 MG tablet Take 1 tablet (100 mg total) by mouth daily. 30 tablet 5   ??? letermovir (PREVYMIS) 480 mg tablet Take 1 tablet (480 mg total) by mouth daily. 28 tablet 2   ??? loratadine (CLARITIN) 10 mg tablet Take 1 tablet (10 mg total) by mouth daily. 30 tablet 2   ??? magnesium oxide-Mg AA chelate (MAGNESIUM, AMINO ACID CHELATE,) 133 mg Tab Take 1 tablet by mouth every morning before breakfast. 270 tablet 3   ??? ondansetron (ZOFRAN) 8 MG tablet Take 1 tablet (8 mg total) by mouth every eight (8) hours as needed for nausea. 30 tablet 2   ??? pregabalin (LYRICA) 75 MG capsule Take 1 capsule (75 mg total) by mouth every morning AND 2 capsules (150 mg total) every evening. 90 capsule 0   ??? promethazine (PHENERGAN) 25 MG tablet Take 1 tablet (25 mg total) by mouth every six (6) hours as needed for nausea. 30 tablet 1   ??? tacrolimus (PROGRAF) 0.5 MG capsule Take 2 capsules (1 mg total) by mouth daily AND 3 capsules (1.5 mg total) nightly. 150 capsule 5   ??? traZODone (DESYREL) 50 MG tablet Take 1 tablet (50 mg total) by mouth nightly as needed for sleep (insomnia). 90 tablet 3   ??? ursodioL (ACTIGALL) 300 mg capsule Take 1 capsule (300 mg total) by mouth Three (3) times a day. 90 capsule 11   ??? vancomycin (VANCOCIN) 125 MG capsule Take 1 capsule (125 mg total) by mouth four (4) times a day. 40 capsule 0     No current facility-administered medications for this visit.     Allergies   Allergen Reactions   ??? Penicillins Hives   ??? Sulfa (Sulfonamide Antibiotics) Other (See Comments) and Rash     Unknown    Other reaction(s): Unknown       Lab Results   Component Value Date    WBC 4.3 (L) 04/21/2020    HGB 11.2 (L) 04/21/2020    HCT 34.6 (L)  04/21/2020    PLT 63 (L) 04/21/2020       Lab Results   Component Value Date    NA 142 04/21/2020    K 3.4 04/21/2020    CL 111 (H) 04/21/2020    CO2 26.0 04/21/2020    BUN 13 04/21/2020    CREATININE 1.18 (H) 04/21/2020    GLU 128 04/21/2020    CALCIUM 8.7 04/21/2020    MG 1.5 (L) 04/21/2020    PHOS 4.1 02/22/2020       Lab Results   Component Value Date    BILITOT 0.3 04/21/2020    BILIDIR 0.30 02/21/2020    PROT 5.5 (L) 04/21/2020    ALBUMIN 3.0 (L) 04/21/2020    ALT 12 04/21/2020    AST 23 04/21/2020    ALKPHOS 68 04/21/2020    GGT 21 01/26/2020       Lab Results   Component Value Date    INR 1.21 02/22/2020    APTT 33.2 02/17/2020       Assessment and plan:  Patient's caregiver will be assessed in ER and instructed patient to keep [place] informed of the situation once she is assessed and if he has caregiver needs or other symptoms himself in case this is not entirely due to C.Diff.      Disposition:  Return to clinic at previously scheduled interval.    I spent 15 minutes assessing problem and developing a plan.    Park Breed, PA  [facility]

## 2020-04-26 ENCOUNTER — Encounter: Admit: 2020-04-26 | Discharge: 2020-04-27 | Payer: PRIVATE HEALTH INSURANCE

## 2020-04-26 ENCOUNTER — Encounter
Admit: 2020-04-26 | Discharge: 2020-04-27 | Payer: PRIVATE HEALTH INSURANCE | Attending: Primary Care | Primary: Primary Care

## 2020-04-26 DIAGNOSIS — Z79899 Other long term (current) drug therapy: Principal | ICD-10-CM

## 2020-04-26 DIAGNOSIS — Z880 Allergy status to penicillin: Principal | ICD-10-CM

## 2020-04-26 DIAGNOSIS — M62571 Muscle wasting and atrophy, not elsewhere classified, right ankle and foot: Principal | ICD-10-CM

## 2020-04-26 DIAGNOSIS — Z87891 Personal history of nicotine dependence: Principal | ICD-10-CM

## 2020-04-26 DIAGNOSIS — A0472 Enterocolitis due to Clostridium difficile, not specified as recurrent: Principal | ICD-10-CM

## 2020-04-26 DIAGNOSIS — N179 Acute kidney failure, unspecified: Principal | ICD-10-CM

## 2020-04-26 DIAGNOSIS — M62572 Muscle wasting and atrophy, not elsewhere classified, left ankle and foot: Principal | ICD-10-CM

## 2020-04-26 DIAGNOSIS — Z48298 Encounter for aftercare following other organ transplant: Principal | ICD-10-CM

## 2020-04-26 DIAGNOSIS — D849 Immunodeficiency, unspecified: Principal | ICD-10-CM

## 2020-04-26 DIAGNOSIS — C9201 Acute myeloblastic leukemia, in remission: Principal | ICD-10-CM

## 2020-04-26 DIAGNOSIS — Z9484 Stem cells transplant status: Principal | ICD-10-CM

## 2020-04-26 DIAGNOSIS — J302 Other seasonal allergic rhinitis: Principal | ICD-10-CM

## 2020-04-26 LAB — CBC W/ AUTO DIFF
BASOPHILS ABSOLUTE COUNT: 0 10*9/L (ref 0.0–0.1)
BASOPHILS RELATIVE PERCENT: 0.2 %
EOSINOPHILS ABSOLUTE COUNT: 0.1 10*9/L (ref 0.0–0.4)
EOSINOPHILS RELATIVE PERCENT: 2.6 %
HEMATOCRIT: 34.4 % — ABNORMAL LOW (ref 41.0–53.0)
HEMOGLOBIN: 11.5 g/dL — ABNORMAL LOW (ref 13.5–17.5)
LARGE UNSTAINED CELLS: 3 % (ref 0–4)
LYMPHOCYTES ABSOLUTE COUNT: 0.5 10*9/L — ABNORMAL LOW (ref 1.5–5.0)
LYMPHOCYTES RELATIVE PERCENT: 10.3 %
MEAN CORPUSCULAR HEMOGLOBIN CONC: 33.4 g/dL (ref 31.0–37.0)
MEAN CORPUSCULAR HEMOGLOBIN: 36.6 pg — ABNORMAL HIGH (ref 26.0–34.0)
MEAN CORPUSCULAR VOLUME: 109.8 fL — ABNORMAL HIGH (ref 80.0–100.0)
MEAN PLATELET VOLUME: 11.7 fL — ABNORMAL HIGH (ref 7.0–10.0)
MONOCYTES ABSOLUTE COUNT: 0.3 10*9/L (ref 0.2–0.8)
MONOCYTES RELATIVE PERCENT: 5.8 %
NEUTROPHILS ABSOLUTE COUNT: 3.8 10*9/L (ref 2.0–7.5)
NEUTROPHILS RELATIVE PERCENT: 78.5 %
PLATELET COUNT: 68 10*9/L — ABNORMAL LOW (ref 150–440)
RED BLOOD CELL COUNT: 3.13 10*12/L — ABNORMAL LOW (ref 4.50–5.90)
RED CELL DISTRIBUTION WIDTH: 14.2 % (ref 12.0–15.0)
WBC ADJUSTED: 4.8 10*9/L (ref 4.5–11.0)

## 2020-04-26 LAB — COMPREHENSIVE METABOLIC PANEL
ALBUMIN: 2.9 g/dL — ABNORMAL LOW (ref 3.4–5.0)
ALKALINE PHOSPHATASE: 77 U/L (ref 46–116)
ALT (SGPT): 28 U/L (ref 10–49)
ANION GAP: 1 mmol/L — ABNORMAL LOW (ref 5–14)
AST (SGOT): 37 U/L — ABNORMAL HIGH (ref <=34)
BILIRUBIN TOTAL: 0.3 mg/dL (ref 0.3–1.2)
BLOOD UREA NITROGEN: 18 mg/dL (ref 9–23)
BUN / CREAT RATIO: 12
CALCIUM: 9 mg/dL (ref 8.7–10.4)
CHLORIDE: 108 mmol/L — ABNORMAL HIGH (ref 98–107)
CO2: 30 mmol/L (ref 20.0–31.0)
CREATININE: 1.53 mg/dL — ABNORMAL HIGH
EGFR CKD-EPI AA MALE: 58 mL/min/{1.73_m2} — ABNORMAL LOW (ref >=60)
EGFR CKD-EPI NON-AA MALE: 50 mL/min/{1.73_m2} — ABNORMAL LOW (ref >=60)
GLUCOSE RANDOM: 111 mg/dL (ref 70–179)
POTASSIUM: 3.5 mmol/L (ref 3.4–4.5)
PROTEIN TOTAL: 5.3 g/dL — ABNORMAL LOW (ref 5.7–8.2)
SODIUM: 139 mmol/L (ref 135–145)

## 2020-04-26 LAB — SLIDE REVIEW

## 2020-04-26 LAB — MAGNESIUM: MAGNESIUM: 1.5 mg/dL — ABNORMAL LOW (ref 1.6–2.6)

## 2020-04-26 LAB — TACROLIMUS LEVEL, TROUGH: TACROLIMUS, TROUGH: 6.5 ng/mL (ref 5.0–15.0)

## 2020-04-26 MED ORDER — TACROLIMUS 0.5 MG CAPSULE, IMMEDIATE-RELEASE
ORAL_CAPSULE | ORAL | 5 refills | 38 days
Start: 2020-04-26 — End: 2020-05-03

## 2020-04-26 MED ORDER — VALACYCLOVIR 500 MG TABLET
ORAL_TABLET | Freq: Every day | ORAL | 0 refills | 30 days
Start: 2020-04-26 — End: 2020-05-26

## 2020-04-26 MED ADMIN — heparin, porcine (PF) 100 unit/mL injection 200 Units: 200 [IU] | INTRAVENOUS | @ 14:00:00 | Stop: 2020-04-26

## 2020-04-26 NOTE — Unmapped (Signed)
Bone Marrow Transplant and Cellular Therapy Program  Immunosuppressive Therapy Note    [name] is a 59 y.o. 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 [name] is currently day +84.    Current dose: tacrolimus 1 mg in AM and 1.5 in PM (dose increased on 12/2)    Goal tacrolimus Level: 5-10 ng/mL    Resulted level: 6.5 ng/mL     Lab Results   Component Value Date/Time    TACROLIMUS 6.5 04/26/2020 09:22 AM    TACROLIMUS 4.3 (L) 04/21/2020 07:52 AM    TACROLIMUS 2.7 (L) 04/19/2020 09:50 AM     Lab Results   Component Value Date/Time    CREATININE 1.53 (H) 04/26/2020 09:22 AM    CREATININE 1.18 (H) 04/21/2020 07:52 AM    CREATININE 1.19 (H) 04/19/2020 09:50 AM     Recommendation: Tacrolimus level is now therapeutic. His creatinine has increased from last week. LFTs stable/WNL. Given above, plan to continue tacrolimus 1 mg in AM and 1.5 mg in PM and recheck tacrolimus level and renal function in clinic next week. If his creatinine continue trend ing up, plan to reduce his dose of tacrolimus next week.     We will continue to monitor levels. Patient will be followed for changes in renal and hepatic function, toxicity, and efficacy.     Rulon Abide, PharmD BCOP  BMT Clinical Pharmacist Practitioner

## 2020-04-26 NOTE — Unmapped (Deleted)
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  Transplant was tolerated relatively well and primarily complicated by mucositis requiring PCA.    Interval History:     John Erickson presents with his fiance to clinic for follow up after being diagnosed with C.Diff last week, he taking a course of oral vancomycin.      ROS:  A comprehensive ROS performed and is negative except for pertinent positives as listed above in interval history.     I reviewed and updated past medical, surgical, social, and family history as appropriate.     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,XY[1]    Variants of Known or Likely Clinical Significance  Gene Transcript  Predicted Protein  VAF (%)   CEBPA c.[id number] p.Ser251Argfs 41      Variants of Unknown Significance  Gene Transcript Predicted Protein VAF (%)   TET2 c.4946A>G p.Tyr1649Cys 49      -  FLT-3-ITD and FLT-3-TKD studies are negative    Pertinent Phenotypic data:    Disease-specific prognostic estimate:     Induction:   7+3+HD Dauno (C1D1 2/24)    Recovery Marrow:    Final Diagnosis   Date Value Ref Range Status   08/12/2019   Final    Bone marrow, right iliac, aspiration and biopsy  -  Normocellular bone marrow (60%) with trilineage hematopoiesis and 4% blasts by manual aspirate differential  -  Routine cytogenetic analysis is pending  -  Flow cytometric MRD analysis is pending    This electronic signature is attestation that the pathologist personally reviewed the submitted material(s) and the final diagnosis reflects that evaluation.            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,XY[1]  Variants of Known or Likely Clinical Significance  Gene Transcript  Predicted Protein  VAF (%)   CEBPA c.753_762del p.Ser251Argfs 41      Variants of Unknown Significance  Gene Transcript Predicted Protein VAF (%)   TET2 c.4946A>G p.Tyr1649Cys 49      -  FLT-3-ITD and FLT-3-TKD studies are negative  07/09/2019 Initial Diagnosis    AML (acute myeloid leukemia) (CMS-HCC)     07/14/2019 - 07/20/2019 Chemotherapy    IP LEUKEMIA 7+3 HIGH DOSE DAUNOrubicin  DAUNOrubicin 90 mg/m2 IV on Days 1, 2, 3  Cytarabine 100 mg/m2 CIVI on Days 1 to 7     07/27/2019 Biopsy    Bone marrow, right iliac, aspiration and biopsy  -  Hypocellular bone marrow (<5%) with treatment effect, markedly reduced trilineage hematopoiesis, and 3% blasts by manual aspirate differential (see Comment).     08/24/2019 - 09/28/2019 Chemotherapy    IP LEUKEMIA HIGH DOSE CYTARABINE CONSOLIDATION (3 G/M2)  cytarabine 3 g/m2 every 12 hours on days 1, 3 and 5 every 28 days     08/24/2019 - 12/03/2019 Chemotherapy    IP LEUKEMIA HIGH DOSE CYTARABINE CONSOLIDATION (3 G/M2) ON DAYS 1,2,3  cytarabine 3 g/m2 IV every 12 hours on days 1, 2, 3, every 28 days     01/19/2020 - 01/19/2020 Chemotherapy    BMT OP BUSULFAN TEST DOSE  Busulfan 0.8 mg/kg IV ONCE     01/26/2020 -  Chemotherapy    BMT IP MAC BUSULFAN / FLUDARABINE / rATG (MUD)   Busulfan IV Days -6 to -3  Fludarabine 40 mg/m2 IV Days -6 to -3  rATG 0.5 mg/kg IV Day -3, 1.5 mg/kg IV Day -2, 2.5 mg/kg IV Day -1       Physical exam:      72, Cares for self; unable to carry on normal activity or to do active work (ECOG equivalent 1)    General: No acute distress noted.   Central Venous Access: Line c/d/i; No erythema, TTP, or drainage noted. Port not accesssed.   ENT: Moist mucous membranes. Oropharhynx without lesions, erythema or exudate.   Cardiovascular: Pulse normal rate, regularity and rhythm. S1 and S2 normal, without any murmur, rub, or gallop.  Lungs: Clear to auscultation bilaterally, without wheezes/crackles/rhonchi. Good air movement.   Skin: No new lesions. No rash or abrasions.  Psychiatry: Alert and oriented to person, place, and time.   Gastrointestinal/Abdomen: Normoactive bowel sounds, abdomen soft, non-tender   Musculoskeletal/Extremities: FROM throughout. Trace BLE edema and no facial swelling.    Neurologic: CNII-XII intact. Normal strength and sensation throughout    Labs:  I reviewed all labs from today in Epic. See EMR for lab results.    Lab Results   Component Value Date    WBC 4.3 (L) 04/21/2020    HGB 11.2 (L) 04/21/2020    HCT 34.6 (L) 04/21/2020    PLT 63 (L) 04/21/2020     Lab Results   Component Value Date    NA 142 04/21/2020    K 3.4 04/21/2020    CL 111 (H) 04/21/2020    CO2 26.0 04/21/2020    BUN 13 04/21/2020    CREATININE 1.18 (H) 04/21/2020    GLU 128 04/21/2020    CALCIUM 8.7 04/21/2020    MG 1.5 (L) 04/21/2020    PHOS 4.1 02/22/2020     Lab Results   Component Value Date    BILITOT 0.3 04/21/2020    BILIDIR 0.30 02/21/2020    PROT 5.5 (L) 04/21/2020    ALBUMIN 3.0 (L) 04/21/2020    ALT 12 04/21/2020    AST 23 04/21/2020    ALKPHOS 68 04/21/2020    GGT 21 01/26/2020     Lab Results   Component Value Date    PT 14.1 (H) 02/22/2020    INR 1.21 02/22/2020  APTT 33.2 02/17/2020     Assessment and Plan    BMT:??AML, CR MRD Negative  HCT-CI (age adjusted) 7??(psych, severe pulmonary dysfunction, and age)   Conditioning:??MAC Bu/Flu/ATG  Donor:??9/10, ABO O+, CMV+  Engraftment:??Date of last granix injection: 02/19/20    Chimerism:   - 04/07/20: 94% donor in CD3+ and >95% donor in UF.  Has Day 90 BmBx scheduled for 12/8.      GVHD prophylaxis:??  1.Tacrolimus being managed by pharmacy with a goal of 5-10 ng/mL  2. Methotrexate??15 mg/m2 IVP on day +1 then 10 mg/m2 on days +3, +6 and +11  3. ATG per [facility] standard dosing was included  ??  Heme:??  Transfusion criteria:??1 unit of PRBCs for Hgb<7 and 1 unit of platelets for Plt <10K or bleeding.   -??No history of transfusion reactions.   -Plts decreased, but now slowly improving.  Possibly related to whichever medication caused edema or slow engraftment.     ID:??  Prophylaxis:  - Antiviral: Valtrex 500mg  daily   - Antifungal: None  - PJP: Begin Dapsone 100mg  PO daily (has Sulfa allergy) on 12/1  - CMV D+/R+: Restarted Letermovir prophy on 12/1, 480mg  po daily, Continue through D+100.  *Monitor for swelling after restart.    C.Diff:   - Mild symptoms, positive on 12/1  - Oral Vancomycin (12/2- ) plan 10 day course    Vesicular lesions:  - Initial AC fossa lesion 10/25, lesions were migratory and vesicular in a linear fashion  - Swab and blood both negative for VZV or HSV.  Trial of treatment dose Valtex did not change rash.    - As of 11/16 lesions resolved and no further rash.   ??  Allergy:  - PCN allergy (hives) has tolerated Cefepime without issues    Seasonal Allergies:  - Flonase and Claritin daily prn.   ??  EBV:  - IgM Ab + 01/05/20 but viral load 01/05/20 not detected.   - Viral Load 12/1, not detected.    *Full virals sent 11/9 given mild thrombocytopenia and rash this panel was negative.  ??  Renal:   AKI: -04/05/20: AKI  Worse and max up to 2 today.  Was taking OTC diuretic to help with swelling.   - 04/19/20: Creatinine returning to baseline now that he has stopped diuretic and is drinking well.     FEN:   Hypokalemia: WNL today.  Will monitor but has hx of hyperkalemia so will hold off on supplements.      Hypomagnesemia  - Mag Chelate 1 tablet daily.   ??  Hepatic:??  - No active issues.   - VOD prophylaxis with Actigall TID on hold since 11/17 due to swelling/rash.  Will not restart now that he is already Day 6 and not likely to provide much benefit at this point.     Pulm: DLCO 62%  - Former smoker, quit 01/12/20. Using nicotine patch currently. Has chronic dry cough but PFTs were acceptable.   07/24/19: CT chest w/ 0.6cm RUL nodules, no clear etiology.     ** Discussed with Dr. Oswald Hillock prior to transplant, ok to move forward, no plans to repeat unless new findings.       Neuro/Pain:??  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     Psychosocial:??  -  Substance abuse (opioid history). Sober for 5 years.  - Followed by Frederik Schmidt in [facility], placed referral for [facility] while admitted, are not seeing inpatients, will follow- up with him post discharge.   - Insomnia: Continue Trazodone 50mg  nightly prn.     Bone health:  Dexa scan from 10/13 resulted and shows low bone density.   - Seen by pharmacy and discussed zometa and calcium supplemenation 10/22  - Zometa first dose on 03/23/20.  Will need to monitor for recurrence of swelling when we give second dose since it occurred right after 1st dose.    ** He has a dental appointment 12/9 but just for impressions for partial implant (it is a non-invasive dental procedure as the implants are clip on with a retainer, not surgically attached, so ok to proceed with Zometa)   ??  Summary:   - Continue oral vancomycin  - Day 90 marrow this afternoon, after he would like to have line removed prior to Christmas if results good and no other issues.   - RTC for weekly visits

## 2020-04-26 NOTE — Unmapped (Signed)
Labs look good with exception of kidney function which is too high. Make sure you are drinking enough fluids, you probably need about 2L.     I will call you if I need to make changes to your tacrolimus dose.        -------------------------------------------------------------------------------  Return to clinic on Wednesday to see one of the providers. You will receive a time when you check out today.    Lab Results   Component Value Date    WBC 4.8 04/26/2020    HGB 11.5 (L) 04/26/2020    HCT 34.4 (L) 04/26/2020    PLT 68 (L) 04/26/2020     Lab Results   Component Value Date    NA 139 04/26/2020    K 3.5 04/26/2020    CL 108 (H) 04/26/2020    CO2 30.0 04/26/2020    BUN 18 04/26/2020    CREATININE 1.53 (H) 04/26/2020    GLU 111 04/26/2020    CALCIUM 9.0 04/26/2020    MG 1.5 (L) 04/26/2020    PHOS 4.1 02/22/2020     Lab Results   Component Value Date    BILITOT 0.3 04/26/2020    BILIDIR 0.30 02/21/2020    PROT 5.3 (L) 04/26/2020    ALBUMIN 2.9 (L) 04/26/2020    ALT 28 04/26/2020    AST 37 (H) 04/26/2020    ALKPHOS 77 04/26/2020    GGT 21 01/26/2020     Lab Results   Component Value Date    INR 1.21 02/22/2020    APTT 33.2 02/17/2020       For prescription refills:   For refills, please check your medication bottles to see if you have additional refills left. If so, please call your pharmacy and follow the directions to request a refill. If you do not have any refills left, please make a request during your clinic visit or by submitting a request through MyChart or by calling [phone]. Please allow 24 hours if your request is made during the week or 48 hours if requests are made on the weekends or holidays.     --------------------------------------------------------------------------------------------------------------------  For appointments & questions Monday through Friday 8 AM-4:30 PM     Please call [phone] or Toll free [phone]    On Nights, Weekends and Holidays  Call [phone] and ask for the oncologist on call    Please visit [employer], a resource created just for family members and caregivers.  This website lists support services, how and where to ask for help. It has tools to assist you as you help [place] care for your loved one.    [facility]  [address]  [place], [place] [address]  www.[facility].org

## 2020-04-26 NOTE — Unmapped (Signed)
Patient John Erickson was contacted today regarding rescheduling 04/26/2020 appt.'s to 05/03/2020 per pt.'s VM request. Voicemail was left for patient with information and to call back with any questions or concerns.

## 2020-04-27 LAB — CMV DNA, QUANTITATIVE, PCR
CMV QUANT: <50 [IU]/mL — ABNORMAL HIGH (ref <0)
CMV VIRAL LD: DETECTED — AB

## 2020-04-27 NOTE — Unmapped (Signed)
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  Transplant was tolerated relatively well and primarily complicated by mucositis requiring PCA.    Interval History:   John Erickson presents to clinic for follow up visit. He was diagnosed with C.Diff on 12/1 and has been taking a course of oral vancomycin. He never really had significant diarrhea or GI issues and he is feeling much better. He will complete his oral vanco on Saturday. He is now having 1-2 formed to soft stools, but yesterday did not have any. His fiance also had abdominal pain and diarrhea similar to his presentation a few days after he was diagnosed. She was tested, but was C.diff negative and her GI symptoms resolved after 24 hr, so may have just been a GI bug.   He does have some mild nausea intermittently, no vomiting. He takes phenergen mostly as precaution once a day in the mornings. He is eating ok, he has been needing to push himself to drink fluids. This may have fallen off in the past few days. He thinks maybe getting in about 1000 ml daily. His weight is up about 6 lb from his last visit.    He continues to have some swelling in his ankles/shins, but he thinks this is stable. He also has some residual swelling in his face, most noticeable around his eyes. This was much worse several weeks ago, occurring after a Zometa infusion. Was unsure if allergy, but held letermovir, fluconazole, ursodiol and discovered at this time that he was not taking dapsone as he should be. He does now have the dapsone Rx, but has not started it. He did restart his letermovir, so will need to watch this swelling issue closely. He was due or nearly due to stop the fluconazole and ursodiol so these were discontinued. He did hold his tacrolimus dose today, but he reports taking 1 mg po bid, not the 1 mg AM and 1.5 mg PM that was listed on his medication sheet.     He denies any fevers, chills, sob or cough. He has no signs or symptoms of GVHD, specifically no rash, oral, occular, joint pain.     He was scheduled for bone marrow biopsy today, but was late arriving to visit so this was rescheduled to next week.     ROS:  A comprehensive ROS performed and is negative except for pertinent positives as listed above in interval history.     I reviewed and updated past medical, surgical, social, and family history as appropriate.     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,XY[1]    Variants of Known or Likely Clinical Significance  Gene Transcript  Predicted Protein  VAF (%)   CEBPA c.[id number] p.Ser251Argfs 41  Variants of Unknown Significance  Gene Transcript Predicted Protein VAF (%)   TET2 c.4946A>G p.Tyr1649Cys 49      -  FLT-3-ITD and FLT-3-TKD studies are negative    Pertinent Phenotypic data:    Disease-specific prognostic estimate:     Induction:   7+3+HD Dauno (C1D1 2/24)    Recovery Marrow:    Final Diagnosis   Date Value Ref Range Status   08/12/2019   Final    Bone marrow, right iliac, aspiration and biopsy  -  Normocellular bone marrow (60%) with trilineage hematopoiesis and 4% blasts by manual aspirate differential  -  Routine cytogenetic analysis is pending  -  Flow cytometric MRD analysis is pending    This electronic signature is attestation that the pathologist personally reviewed the submitted material(s) and the final diagnosis reflects that evaluation.            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,XY[1]  Variants of Known or Likely Clinical Significance  Gene Transcript  Predicted Protein  VAF (%)   CEBPA c.753_762del p.Ser251Argfs 41      Variants of Unknown Significance  Gene Transcript Predicted Protein VAF (%)   TET2 c.4946A>G p.Tyr1649Cys 49      -  FLT-3-ITD and FLT-3-TKD studies are negative     07/09/2019 Initial Diagnosis    AML (acute myeloid leukemia) (CMS-HCC)     07/14/2019 - 07/20/2019 Chemotherapy    IP LEUKEMIA 7+3 HIGH DOSE DAUNOrubicin  DAUNOrubicin 90 mg/m2 IV on Days 1, 2, 3  Cytarabine 100 mg/m2 CIVI on Days 1 to 7     07/27/2019 Biopsy    Bone marrow, right iliac, aspiration and biopsy  -  Hypocellular bone marrow (<5%) with treatment effect, markedly reduced trilineage hematopoiesis, and 3% blasts by manual aspirate differential (see Comment).     08/24/2019 - 09/28/2019 Chemotherapy    IP LEUKEMIA HIGH DOSE CYTARABINE CONSOLIDATION (3 G/M2)  cytarabine 3 g/m2 every 12 hours on days 1, 3 and 5 every 28 days     08/24/2019 - 12/03/2019 Chemotherapy    IP LEUKEMIA HIGH DOSE CYTARABINE CONSOLIDATION (3 G/M2) ON DAYS 1,2,3  cytarabine 3 g/m2 IV every 12 hours on days 1, 2, 3, every 28 days     01/19/2020 - 01/19/2020 Chemotherapy    BMT OP BUSULFAN TEST DOSE  Busulfan 0.8 mg/kg IV ONCE     01/26/2020 -  Chemotherapy BMT IP MAC BUSULFAN / FLUDARABINE / rATG (MUD)   Busulfan IV Days -6 to -3  Fludarabine 40 mg/m2 IV Days -6 to -3  rATG 0.5 mg/kg IV Day -3, 1.5 mg/kg IV Day -2, 2.5 mg/kg IV Day -1       Physical exam:  Temp:  [36.6 ??C (97.8 ??F)] 36.6 ??C (97.8 ??F)  Heart Rate:  [99] 99  BP: (121)/(82) 121/82  70, Cares for self; unable to carry on normal activity or to do active work (ECOG equivalent 1)    General: No acute distress noted.   Central Venous Access: Line c/d/i; No erythema, TTP, or drainage noted. Port not accesssed.   ENT: Moist mucous membranes. Oropharhynx without lesions, erythema or exudate.   Cardiovascular: Pulse normal rate, regularity and rhythm. S1 and S2 normal, without any murmur, rub, or gallop.  Lungs: Clear to auscultation bilaterally, without wheezes/crackles/rhonchi. Good air movement.   Skin: No new lesions. No rash or abrasions.  Psychiatry: Alert and oriented to person, place, and time.   Gastrointestinal/Abdomen: Normoactive bowel sounds, abdomen soft, non-tender   Musculoskeletal/Extremities: FROM throughout. +1 BLE edema ankles/shinks with mild facial swelling surrounding eyes most notably.     Neurologic: CNII-XII intact. Normal strength and sensation throughout    Labs:  I reviewed all labs from today in Epic. See EMR for lab results.    Lab Results   Component Value Date    WBC 4.8 04/26/2020    HGB 11.5 (L) 04/26/2020    HCT 34.4 (L) 04/26/2020    PLT 68 (L) 04/26/2020     Lab Results   Component Value Date    NA 139 04/26/2020    K 3.5 04/26/2020    CL 108 (H) 04/26/2020    CO2 30.0 04/26/2020    BUN 18 04/26/2020    CREATININE 1.53 (H) 04/26/2020    GLU 111 04/26/2020    CALCIUM 9.0 04/26/2020    MG 1.5 (L) 04/26/2020    PHOS 4.1 02/22/2020     Lab Results   Component Value Date    BILITOT 0.3 04/26/2020    BILIDIR 0.30 02/21/2020    PROT 5.3 (L) 04/26/2020    ALBUMIN 2.9 (L) 04/26/2020    ALT 28 04/26/2020    AST 37 (H) 04/26/2020    ALKPHOS 77 04/26/2020    GGT 21 01/26/2020     Lab Results   Component Value Date    PT 14.1 (H) 02/22/2020    INR 1.21 02/22/2020    APTT 33.2 02/17/2020     Assessment and Plan    BMT:??AML, CR MRD Negative  HCT-CI (age adjusted) 7??(psych, severe pulmonary dysfunction, and age)   Conditioning:??MAC Bu/Flu/ATG  Donor:??9/10, ABO O+, CMV+  Engraftment:??Date of last granix injection: 02/19/20    Chimerism:   - 04/07/20: 94% donor in CD3+ and >95% donor in UF.  Has Day 54 BmBx was scheduled for 12/8, but now moved to 12/15 since he was later arriving today.    Repeat chimerism studies next visit from PB and marrow.     GVHD prophylaxis:??  1.Tacrolimus being managed by pharmacy with a goal of 5-10 ng/mL  2. Methotrexate??15 mg/m2 IVP on day +1 then 10 mg/m2 on days +3, +6 and +11  3. ATG per [facility] standard dosing was included  ??  Heme:??  Transfusion criteria:??1 unit of PRBCs for Hgb<7 and 1 unit of platelets for Plt <10K or bleeding.   -??No history of transfusion reactions.   - Plts decreased, but now slowly improving.  Possibly related to whichever medication caused edema or slow engraftment.     ID:??  Prophylaxis:  - Antiviral: Valtrex 500mg  daily   - Antifungal: None  - PJP: Begin Dapsone 100mg  PO daily (has Sulfa allergy) on 12/1. He has supply but still needs to start taking this.   - CMV D+/R+: Restarted Letermovir prophy on 12/1, 480mg  po daily, Continue  through D+100.  *Monitor for swelling after restart. If CMV is negative this week and swelling around eyes remains, would favor  just discontinue med and monitor CMV.     C.Diff:   - Mild symptoms, positive on 12/1  - Oral Vancomycin (12/2-12/11 ) plan 10 day course    Vesicular lesions:  - Initial AC fossa lesion 10/25, lesions were migratory and vesicular in a linear fashion  - Swab and blood both negative for VZV or HSV.  Trial of treatment dose Valtex did not change rash.    - As of 11/16 lesions resolved and no further rash.   ??  Allergy:  - PCN allergy (hives) has tolerated Cefepime without issues    Seasonal Allergies:  - Flonase and Claritin daily prn.   ??  EBV:  - IgM Ab + 01/05/20 but viral load 01/05/20 not detected.   - Viral Load 12/1, not detected, pending today.    *Full virals sent 11/9 given mild thrombocytopenia and rash this panel was negative.  ??  Renal:   AKI:   -04/05/20: AKI  Worse and max up to 2 today.  Was taking OTC diuretic to help with swelling.   - 04/19/20: Creatinine returning to baseline now that he has stopped diuretic and is drinking well.   -04/26/20: Creatinine is trending back up, reminded he should drink more. His diarrhea has improved, but he thinks he has only been getting in about 1L per day.     FEN:   Hypomagnesemia  - Mag Chelate 1 tablet daily.   ??  Hepatic:??  - No active issues.   - VOD prophylaxis with Actigall TID on hold since 11/17 due to swelling/rash.  Will not restart now that he is nearing day +100.     Pulm: DLCO 62%  - Former smoker, quit 01/12/20. Using nicotine patch currently. Has chronic dry cough but PFTs were acceptable.   07/24/19: CT chest w/ 0.6cm RUL nodules, no clear etiology.     ** Discussed with Dr. Oswald Hillock prior to transplant, ok to move forward, no plans to repeat unless new findings.       Neuro/Pain:??  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     Psychosocial:??  - Substance abuse (opioid history). Sober for 5 years.  - Followed by Frederik Schmidt in [facility], placed referral for [facility] while admitted, are not seeing inpatients, will follow- up with him post discharge.   - Insomnia: Continue Trazodone 50mg  nightly prn.     Bone health:  Dexa scan from 10/13 resulted and shows low bone density.   - Seen by pharmacy and discussed zometa and calcium supplemenation 10/22  - Zometa first dose on 03/23/20.  Will need to monitor for recurrence of swelling when we give second dose since it occurred right after 1st dose.    ** He has a dental appointment 12/9 but just for impressions for partial implant (it is a non-invasive dental procedure as the implants are clip on with a retainer, not surgically attached, so ok to proceed with Zometa)   ??  Summary:   - Continue oral vancomycin to complete course on Saturday  - Day 90 marrow moved to next week since he was late this morning, if this looks good he would like to  have line removed prior to Christmas if results good and no other issues. He has a port in place so this should not be an issue to get it removed. Can request to schedule at next visit.    - He needs to start his dapsone, he has supply just needs to take it.   - Reminded that he should drink more water  - CMV pending, if negative would favor stopping Letermovir as he has some facial swelling that has returned and unsure if could be from this medication, but he is close to day +100 so would be stopping anyway  - RTC for weekly visits, next on 12/15    I personally spent 65 minutes face-to-face and non-face-to-face in the care of this patient, which includes all pre, intra, and post visit time on the date of service.    Almira Bar Adult Nurse Practitioner  04/26/2020  4:33 PM

## 2020-04-28 LAB — EBV QUANTITATIVE PCR, BLOOD: EBV VIRAL LOAD RESULT: NOT DETECTED

## 2020-05-01 NOTE — Unmapped (Signed)
Cassie called and left a message stating that John Erickson was still having nausea, cramping and loose stools when he put anything on his stomach, spoke with B. Wynona Neat and called them back to tell them to plan on bringing a stool sample with them on Wednesday to his clinic appointment, we would like to check for C. Diff again and see if he needs more antibiotics, he stated he definitely felt like symptoms were better but they were still lingering, he verbalized understanding and will bring sample with him to clinic

## 2020-05-03 ENCOUNTER — Encounter: Admit: 2020-05-03 | Discharge: 2020-05-04 | Payer: PRIVATE HEALTH INSURANCE

## 2020-05-03 ENCOUNTER — Encounter
Admit: 2020-05-03 | Discharge: 2020-05-04 | Payer: PRIVATE HEALTH INSURANCE | Attending: Primary Care | Primary: Primary Care

## 2020-05-03 DIAGNOSIS — C9201 Acute myeloblastic leukemia, in remission: Principal | ICD-10-CM

## 2020-05-03 DIAGNOSIS — R197 Diarrhea, unspecified: Principal | ICD-10-CM

## 2020-05-03 DIAGNOSIS — Z9484 Stem cells transplant status: Principal | ICD-10-CM

## 2020-05-03 DIAGNOSIS — D84822 Immunocompromised state associated with stem cell transplant (CMS-HCC): Principal | ICD-10-CM

## 2020-05-03 DIAGNOSIS — D61818 Other pancytopenia: Principal | ICD-10-CM

## 2020-05-03 LAB — CBC W/ AUTO DIFF
BASOPHILS ABSOLUTE COUNT: 0 10*9/L (ref 0.0–0.1)
BASOPHILS RELATIVE PERCENT: 0.3 %
EOSINOPHILS ABSOLUTE COUNT: 0.1 10*9/L (ref 0.0–0.4)
EOSINOPHILS RELATIVE PERCENT: 2 %
HEMATOCRIT: 31.7 % — ABNORMAL LOW (ref 41.0–53.0)
HEMOGLOBIN: 10.4 g/dL — ABNORMAL LOW (ref 13.5–17.5)
LARGE UNSTAINED CELLS: 3 % (ref 0–4)
LYMPHOCYTES ABSOLUTE COUNT: 0.5 10*9/L — ABNORMAL LOW (ref 1.5–5.0)
LYMPHOCYTES RELATIVE PERCENT: 11.7 %
MEAN CORPUSCULAR HEMOGLOBIN CONC: 32.6 g/dL (ref 31.0–37.0)
MEAN CORPUSCULAR HEMOGLOBIN: 36.2 pg — ABNORMAL HIGH (ref 26.0–34.0)
MEAN CORPUSCULAR VOLUME: 111 fL — ABNORMAL HIGH (ref 80.0–100.0)
MEAN PLATELET VOLUME: 11.8 fL — ABNORMAL HIGH (ref 7.0–10.0)
MONOCYTES ABSOLUTE COUNT: 0.2 10*9/L (ref 0.2–0.8)
MONOCYTES RELATIVE PERCENT: 5.5 %
NEUTROPHILS ABSOLUTE COUNT: 3.2 10*9/L (ref 2.0–7.5)
NEUTROPHILS RELATIVE PERCENT: 77.6 %
PLATELET COUNT: 57 10*9/L — ABNORMAL LOW (ref 150–440)
RED BLOOD CELL COUNT: 2.86 10*12/L — ABNORMAL LOW (ref 4.50–5.90)
RED CELL DISTRIBUTION WIDTH: 14.1 % (ref 12.0–15.0)
WBC ADJUSTED: 4.1 10*9/L — ABNORMAL LOW (ref 4.5–11.0)

## 2020-05-03 LAB — COMPREHENSIVE METABOLIC PANEL
ALBUMIN: 2.9 g/dL — ABNORMAL LOW (ref 3.4–5.0)
ALKALINE PHOSPHATASE: 72 U/L (ref 46–116)
ALT (SGPT): 15 U/L (ref 10–49)
ANION GAP: 5 mmol/L (ref 5–14)
AST (SGOT): 26 U/L (ref <=34)
BILIRUBIN TOTAL: 0.3 mg/dL (ref 0.3–1.2)
BLOOD UREA NITROGEN: 12 mg/dL (ref 9–23)
BUN / CREAT RATIO: 10
CALCIUM: 8.2 mg/dL — ABNORMAL LOW (ref 8.7–10.4)
CHLORIDE: 112 mmol/L — ABNORMAL HIGH (ref 98–107)
CO2: 26 mmol/L (ref 20.0–31.0)
CREATININE: 1.18 mg/dL — ABNORMAL HIGH
EGFR CKD-EPI AA MALE: 79 mL/min/{1.73_m2} (ref >=60)
EGFR CKD-EPI NON-AA MALE: 68 mL/min/{1.73_m2} (ref >=60)
GLUCOSE RANDOM: 143 mg/dL (ref 70–179)
POTASSIUM: 4.1 mmol/L (ref 3.4–4.5)
PROTEIN TOTAL: 5.1 g/dL — ABNORMAL LOW (ref 5.7–8.2)
SODIUM: 143 mmol/L (ref 135–145)

## 2020-05-03 LAB — TACROLIMUS LEVEL: TACROLIMUS BLOOD: 4.5 ng/mL

## 2020-05-03 LAB — MAGNESIUM: MAGNESIUM: 1.5 mg/dL — ABNORMAL LOW (ref 1.6–2.6)

## 2020-05-03 MED ORDER — PROMETHAZINE 25 MG TABLET
ORAL_TABLET | Freq: Four times a day (QID) | ORAL | 1 refills | 8.00000 days | Status: CP | PRN
Start: 2020-05-03 — End: 2020-05-19

## 2020-05-03 MED ORDER — TACROLIMUS 0.5 MG CAPSULE, IMMEDIATE-RELEASE
ORAL_CAPSULE | ORAL | 5 refills | 30.00000 days
Start: 2020-05-03 — End: 2020-05-10

## 2020-05-03 MED ADMIN — MORPhine 4 mg/mL injection 4 mg: 4 mg | INTRAVENOUS | @ 17:00:00 | Stop: 2020-05-03

## 2020-05-03 MED ADMIN — heparin, porcine (PF) 100 unit/mL injection 200 Units: 200 [IU] | INTRAVENOUS | @ 16:00:00 | Stop: 2020-05-03

## 2020-05-03 MED ADMIN — lidocaine (XYLOCAINE) 20 mg/mL (2 %) injection 10 mL: 10 mL | INTRADERMAL | @ 17:00:00 | Stop: 2020-05-03

## 2020-05-03 NOTE — Unmapped (Unsigned)
Patient arrived to chair 2 for BMBX.  No complaints noted.  Access of CVC intact with blood return.       Patient tolerated BMBX and discharged to home.

## 2020-05-03 NOTE — Unmapped (Unsigned)
Bone Marrow Biopsy and Aspiration    Service Date:May 03, 2020 1:14 PM     Performing Clinician: Marcelline Deist, ANP-BC   Performing Service:MEDICINE::HEMATOLOGY/ONCOLOGY      Patient   Name: John Erickson   MRNO: [id number]     Indications:  AML   Indications : Post-transplant sample day +90     Procedure   Time Out: Performed immediately prior to the procedure       Name: Bone Marrow Aspiration and Biopsy      Description:   The procedure risks and alternatives of the procedure were explained to the patient.   John Erickson was made aware that he is not to drive for the next 24 hours if receiving any IV medications associated with this procedure. He verbalized understanding and signed informed consent. After a time-out in which his patient identifiers were checked by 2 providers, the patient was laid in the prone position on the table. The left posterior superior iliac spine and iliac crest were cleaned, prepped and draped in the usual sterile fashion.     Site: left posterior superior iliac spine.     Aseptic preparation, draping, and technique was used.     Anesthesia: 2% Lidocaine.    Periprocedural medications: Morphine 4 mg iv once for pain.    Core Biopsy was obtained.     Aspirate was obtained.     Pressure applied and hemostasis achieved. Sterile bandage applied.    Specimen was sent for routine histopathologic stains and sectioning, flow cytometry, cytogenetics and molecular analysis for MRD.       Complications   None.     Specimen(s)   Bone marrow aspirate and core biopsy.     Additional post procedure instructions:  The patient was given verbal instructions for wound care, such as to keep the biopsy site dry, covered for 24 hours, and to call the provider with including but not limited to: bleeding from the site, redness and/or puss drainage from the site and fever.      The results will be reviewed with the patient when complete.

## 2020-05-03 NOTE — Unmapped (Addendum)
- Counts look good, bone marrow biopsy today.     - Will request removal of the Hickman for next week.     - We will call you if we need to make any changes in your tacrolimus dose.     - Continue letermovir for now, your CMV has still been low level detected in the blood    - Follow up with Dr. Oswald Hillock next week.       --------------------------------------  21st Century Cures Act   Regarding labs and other test results, please know that due to federal laws, all test results are now released and available for review on MyChart immediately upon becoming available. This means that unlike before, you will now receive results before we have had the opportunity to review them and either call to discuss or send you message/letter with an explanation after all results are back.     For some patients, seeing test results without explanation can cause anxiety about those results and even misunderstanding if a patient interprets them incorrectly. We regret any such anxiety you may experience if you choose to review labs before we have discussed them with you. Please note, however, that to prevent anxiety, we recommend that you consider waiting to review your results until you receive a call, message, or letter from [place]. That makes sure that you receive the interpretation with your labs, which can help to avoid misunderstood results and perhaps undue anxiety. Either way, please know we monitor your test results closely.  ------------------------------------------  COVID-19 information:  Given ongoing novel coronavirus (SARS-CoV-2/COVID-19), it is strongly recommended to avoid travel and crowds.  If you develop fever, cough, shortness of breath and/or have known exposure (close contact < 6 feet) with someone who has tested positive, please notify us immediately.    ??? Your best defenses are to stay home as much as possible and use good hand hygiene.  ??? We recommend that you wear a mask (N95 mask preferred) when leaving your home  ??? Important:   o A previous negative test does not mean you will never become infected  o It is unknown if a prior COVID-19 infection provides immunity against future infections    It is important for you to know that the available vaccines are safe.  However, they have not been tested in transplant/cellular therapy patients.  Therefore, we do not know if transplant/cellular therapy patients will develop an immune response (make antibody) against the virus.  We still recommend you obtain the vaccine as long as you are at least 3 months after your transplant/cellular therapy.  We do not recommend the vaccine sooner than 3 months as it is very unlikely that you will respond.        We continue to strongly recommended you avoid travel and crowds.  You should always wear a mask (N95) if you are around anyone outside of your own home, use good hand hygiene, and practice social distancing (>6 feet).  For the foreseeable future, these things should continue even after you are vaccinated. If you develop fever, cough, shortness of breath and/or have known exposure (close contact < 6 feet) with someone who has tested positive, please notify us immediately.          Click Here to Visit [facility] Covid-19 Vaccine Hub  Get the latest facts on the COVID-19 vaccines.      Or visit [facility]???s COVID-19 Vaccine Hub at www.[email or web address] to review the latest facts about the vaccines.     -------------------------------------------------------------------------------  Lab Results   Component Value Date    WBC 4.1 (L) 05/03/2020    HGB 10.4 (L) 05/03/2020    HCT 31.7 (L) 05/03/2020    PLT 57 (L) 05/03/2020     Lab Results   Component Value Date    NA 143 05/03/2020    K 4.1 05/03/2020    CL 112 (H) 05/03/2020    CO2 26.0 05/03/2020    BUN 12 05/03/2020    CREATININE 1.18 (H) 05/03/2020    GLU 143 05/03/2020    CALCIUM 8.2 (L) 05/03/2020    MG 1.5 (L) 05/03/2020    PHOS 4.1 02/22/2020     Lab Results   Component Value Date    BILITOT 0.3 05/03/2020    BILIDIR 0.30 02/21/2020    PROT 5.1 (L) 05/03/2020    ALBUMIN 2.9 (L) 05/03/2020    ALT 15 05/03/2020    AST 26 05/03/2020    ALKPHOS 72 05/03/2020    GGT 21 01/26/2020     Lab Results   Component Value Date    INR 1.21 02/22/2020    APTT 33.2 02/17/2020       For prescription refills:   For refills, please check your medication bottles to see if you have additional refills left. If so, please call your pharmacy and follow the directions to request a refill. If you do not have any refills left, please make a request during your clinic visit or by submitting a request through MyChart or by calling [phone]. Please allow 24 hours if your request is made during the week or 48 hours if requests are made on the weekends or holidays.     --------------------------------------------------------------------------------------------------------------------  For appointments & questions Monday through Friday 8 AM-4:30 PM     Please call [phone] or Toll free [phone]    On Nights, Weekends and Holidays  Call [phone] and ask for the oncologist on call    Please visit PrivacyFever.cz, a resource created just for family members and caregivers.  This website lists support services, how and where to ask for help. It has tools to assist you as you help [place] care for your loved one.    [facility]  [address]  [place], [place] [address]  www.[email or web address]

## 2020-05-03 NOTE — Unmapped (Signed)
Bone Marrow Transplant and Cellular Therapy Program  Immunosuppressive Therapy Note    [name] is a 59 y.o. 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 [name] is currently day +91.    Current dose: tacrolimus 1 mg in AM and 1.5 in PM (instructed to increase on 12/2, but per caregiver patient has continued taking 1 mg BID)    Goal tacrolimus Level: 5-10 ng/mL    Resulted level: 4.5 ng/mL (this level is based on 1 mg BID dosing, no missed doses)     Lab Results   Component Value Date/Time    TACROLIMUS 4.5 05/03/2020 10:50 AM    TACROLIMUS 6.5 04/26/2020 09:22 AM    TACROLIMUS 4.3 (L) 04/21/2020 07:52 AM    TACROLIMUS 2.7 (L) 04/19/2020 09:50 AM     Lab Results   Component Value Date/Time    CREATININE 1.18 (H) 05/03/2020 10:50 AM    CREATININE 1.53 (H) 04/26/2020 09:22 AM    CREATININE 1.18 (H) 04/21/2020 07:52 AM     Recommendation: Tacrolimus level was drawn mid morning and true trough is likely closer to 5. His creatinine has improved significantly from last week. LFTs stable/WNL. Given above, instructed patient and caregiver to start taking tacrolimus at increased dose of 1 mg in AM and 1.5 mg in PM and recheck tacrolimus level and renal function in clinic next week. Patient and caregiver verbalized understanding of this plan.     We will continue to monitor levels. Patient will be followed for changes in renal and hepatic function, toxicity, and efficacy.     Rulon Abide, PharmD BCOP  BMT Clinical Pharmacist Practitioner

## 2020-05-03 NOTE — Unmapped (Signed)
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  Transplant was tolerated relatively well and primarily complicated by mucositis requiring PCA.    Interval History:   John Erickson presents to clinic for follow up visit. He was diagnosed with C.Diff on 12/1 and completed his course of oral vancomycin last Saturday. He called on Monday and was still having some loose stools with abdominal cramping, so was told to bring another stool sample with him this morning. However, today he notes he has not had further issues since Monday and has been feeling better. He did not have any abdominal cramping yesterday or today. He had a normal soft stool this morning, he did bring a sample for repeat testing. He never really had significant diarrhea or GI issues.  He does have some mild nausea intermittently, no vomiting. He takes phenergen mostly as precaution once a day in the mornings, he would like this refilled today. He is eating ok, this has improved over the past week. He has been working on increasing fluid intake. He had lost some weight with the diarrhea, but today is stable from last week.   He continues to have some swelling in his ankles/shins, this appears stable from last week.  He also continues to have some residual swelling in his face, most noticeable around his eyes, this is also stable from last week.It does not seem worse with restarting meds. This was much worse several weeks ago, occurring after a Zometa infusion. Was unsure if allergy, but held letermovir, fluconazole, ursodiol and discovered at this time that he was not taking dapsone as he should be. He does now have the dapsone and started this last Thursday. He also did restart his letermovir last week. Swelling seems to be about the same and no worse.      He denies any fevers, chills, sob or cough. He has no signs or symptoms of GVHD, specifically no rash, oral, occular, joint pain.     He is schedule for a bone marrow biopsy this afternoon.     ROS:  A comprehensive ROS performed and is negative except for pertinent positives as listed above in interval history.     I reviewed and updated past medical, surgical, social, and family history as appropriate.     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,XY[1]    Variants of Known or Likely Clinical Significance  Gene Transcript  Predicted Protein  VAF (%)   CEBPA c.[id number] p.Ser251Argfs 41      Variants of Unknown Significance  Gene Transcript Predicted Protein VAF (%)   TET2 c.4946A>G p.Tyr1649Cys 49      -  FLT-3-ITD and FLT-3-TKD studies are negative    Pertinent Phenotypic data:    Disease-specific prognostic estimate:  Induction:   7+3+HD Dauno (C1D1 2/24)    Recovery Marrow:    Final Diagnosis   Date Value Ref Range Status   08/12/2019   Final    Bone marrow, right iliac, aspiration and biopsy  -  Normocellular bone marrow (60%) with trilineage hematopoiesis and 4% blasts by manual aspirate differential  -  Routine cytogenetic analysis is pending  -  Flow cytometric MRD analysis is pending    This electronic signature is attestation that the pathologist personally reviewed the submitted material(s) and the final diagnosis reflects that evaluation.            Genetics:   Karyotype/FISH:   RESULTS   Date Value Ref Range Status   07/09/2019   Final Abnormal Karyotype: 46,Y,t(X;8)(q26;q11.2)[19]/46,XY[1]    Normal FISH:  An interphase FISH assay shows no evidence of a rearrangement involving the KMT2A (MLL) gene region in the 200 nuclei scored (see below).           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,XY[1]  Variants of Known or Likely Clinical Significance  Gene Transcript  Predicted Protein  VAF (%)   CEBPA c.753_762del p.Ser251Argfs 41      Variants of Unknown Significance  Gene Transcript Predicted Protein VAF (%)   TET2 c.4946A>G p.Tyr1649Cys 49      -  FLT-3-ITD and FLT-3-TKD studies are negative     07/09/2019 Initial Diagnosis    AML (acute myeloid leukemia) (CMS-HCC)     07/14/2019 - 07/20/2019 Chemotherapy    IP LEUKEMIA 7+3 HIGH DOSE DAUNOrubicin  DAUNOrubicin 90 mg/m2 IV on Days 1, 2, 3  Cytarabine 100 mg/m2 CIVI on Days 1 to 7     07/27/2019 Biopsy    Bone marrow, right iliac, aspiration and biopsy  -  Hypocellular bone marrow (<5%) with treatment effect, markedly reduced trilineage hematopoiesis, and 3% blasts by manual aspirate differential (see Comment).     08/24/2019 - 09/28/2019 Chemotherapy    IP LEUKEMIA HIGH DOSE CYTARABINE CONSOLIDATION (3 G/M2)  cytarabine 3 g/m2 every 12 hours on days 1, 3 and 5 every 28 days     08/24/2019 - 12/03/2019 Chemotherapy    IP LEUKEMIA HIGH DOSE CYTARABINE CONSOLIDATION (3 G/M2) ON DAYS 1,2,3  cytarabine 3 g/m2 IV every 12 hours on days 1, 2, 3, every 28 days     01/19/2020 - 01/19/2020 Chemotherapy    BMT OP BUSULFAN TEST DOSE  Busulfan 0.8 mg/kg IV ONCE     01/26/2020 -  Chemotherapy    BMT IP MAC BUSULFAN / FLUDARABINE / rATG (MUD)   Busulfan IV Days -6 to -3  Fludarabine 40 mg/m2 IV Days -6 to -3  rATG 0.5 mg/kg IV Day -3, 1.5 mg/kg IV Day -2, 2.5 mg/kg IV Day -1       Physical exam:  Temp:  [36.6 ??C (97.9 ??F)-36.7 ??C (98 ??F)] 36.7 ??C (98 ??F)  Heart Rate:  [90-96] 90  BP: (104-114)/(66-81) 104/66 Temp:  [36.6 ??C (97.9 ??F)-36.7 ??C (98 ??F)] 36.7 ??C (98 ??F)  Heart Rate:  [90-96] 90  BP: (104-114)/(66-81) 104/66   Wt: 172 lb  70, Cares for self; unable to carry on normal activity or to do active work (ECOG  equivalent 1)    General: No acute distress noted.   Central Venous Access: Line c/d/i; No erythema, TTP, or drainage noted. Port not accesssed.   ENT: Moist mucous membranes. Oropharhynx without lesions, erythema or exudate.   Cardiovascular: Pulse normal rate, regularity and rhythm. S1 and S2 normal, without any murmur, rub, or gallop.  Lungs: Clear to auscultation bilaterally, without wheezes/crackles/rhonchi. Good air movement.   Skin: No new lesions. No rash or abrasions. Bronzy skin color.   Psychiatry: Alert and oriented to person, place, and time.   Gastrointestinal/Abdomen: Normoactive bowel sounds, abdomen soft, non-tender   Musculoskeletal/Extremities: FROM throughout. +1 BLE edema ankles/shinks with mild facial swelling surrounding eyes most notably.     Neurologic: CNII-XII intact. Normal strength and sensation throughout    Labs:  I reviewed all labs from today in Epic. See EMR for lab results.    Lab Results   Component Value Date    WBC 4.1 (L) 05/03/2020    HGB 10.4 (L) 05/03/2020    HCT 31.7 (L) 05/03/2020    PLT 57 (L) 05/03/2020     Lab Results   Component Value Date    NA 143 05/03/2020    K 4.1 05/03/2020    CL 112 (H) 05/03/2020    CO2 26.0 05/03/2020    BUN 12 05/03/2020    CREATININE 1.18 (H) 05/03/2020    GLU 143 05/03/2020    CALCIUM 8.2 (L) 05/03/2020    MG 1.5 (L) 05/03/2020    PHOS 4.1 02/22/2020     Lab Results   Component Value Date    BILITOT 0.3 05/03/2020    BILIDIR 0.30 02/21/2020    PROT 5.1 (L) 05/03/2020    ALBUMIN 2.9 (L) 05/03/2020    ALT 15 05/03/2020    AST 26 05/03/2020    ALKPHOS 72 05/03/2020 GGT 21 01/26/2020     Lab Results   Component Value Date    PT 14.1 (H) 02/22/2020    INR 1.21 02/22/2020    APTT 33.2 02/17/2020     Assessment and Plan    BMT:??AML, CR MRD Negative  HCT-CI (age adjusted) 7??(psych, severe pulmonary dysfunction, and age)   Conditioning:??MAC Bu/Flu/ATG  Donor:??9/10, ABO O+, CMV+  Engraftment:??Date of last granix injection: 02/19/20    Chimerism:   - 04/07/20: 94% donor in CD3+ and >95% donor in UF.  Has Day 29 BmBx was scheduled for 12/8, but now moved to 12/15 since he was later arriving today.    Bone marrow biopsy with repeat chimerism studies pending today from PB and marrow.     GVHD prophylaxis:??  1.Tacrolimus being managed by pharmacy with a goal of 5-10 ng/mL  2. Methotrexate??15 mg/m2 IVP on day +1 then 10 mg/m2 on days +3, +6 and +11  3. ATG per [facility] standard dosing was included  ??  Heme:??  Transfusion criteria:??1 unit of PRBCs for Hgb<7 and 1 unit of platelets for Plt <10K or bleeding.   -??No history of transfusion reactions.   - Plts decreased, but now slowly improving.  Possibly related to whichever medication caused edema or slow engraftment.     ID:??  Prophylaxis:  - Antiviral: Valtrex 500mg  daily   - Antifungal: None  - PJP: Begin Dapsone 100mg  PO daily (has Sulfa allergy) on 12/1. He has supply but still needs to start taking this.   - CMV D+/R+: Restarted Letermovir prophy on 12/1, 480mg  po daily, Continue through D+100.  *Monitor for swelling after restart. If CMV  is negative this week and swelling around eyes remains, would favor  just discontinue med and monitor CMV.  - CMV was detected <50 last week, repeat pending today, continue Letermovir for now.     C.Diff:   - Mild symptoms, positive on 12/1  - Oral Vancomycin (12/2-12/11) completed 10 day course  - Repeat C.diff 12/15 was negative    Vesicular lesions:  - Initial AC fossa lesion 10/25, lesions were migratory and vesicular in a linear fashion  - Swab and blood both negative for VZV or HSV.  Trial of treatment dose Valtex did not change rash.    - As of 11/16 lesions resolved and no further rash.   ??  Allergy:  - PCN allergy (hives) has tolerated Cefepime without issues    Seasonal Allergies:  - Flonase and Claritin daily prn.   ??  EBV:  - IgM Ab + 01/05/20 but viral load 01/05/20 not detected.   - Viral Load 12/8, not detected    *Full virals sent 11/9 given mild thrombocytopenia and rash this panel was negative.  ??  Renal:   AKI:   - Scr improved from last week with increased oral hydration.     FEN:   Hypomagnesemia  - Mag Chelate 1 tablet daily.   ??  Hepatic:??  - No active issues.   - VOD prophylaxis with Actigall TID on hold since 11/17 due to swelling/rash.  Will not restart now that he is nearing day +100.     Pulm: DLCO 62%  - Former smoker, quit 01/12/20. Using nicotine patch currently. Has chronic dry cough but PFTs were acceptable.   07/24/19: CT chest w/ 0.6cm RUL nodules, no clear etiology.     ** Discussed with Dr. Oswald Hillock prior to transplant, ok to move forward, no plans to repeat unless new findings.       Neuro/Pain:??  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     Psychosocial:??  - Substance abuse (opioid history). Sober for 5 years.  - Followed by Frederik Schmidt in [facility], placed referral for [facility] while admitted, are not seeing inpatients, will follow- up with him post discharge.   - Insomnia: Continue Trazodone 50mg  nightly prn.     Bone health:  Dexa scan from 10/13 resulted and shows low bone density.   - Seen by pharmacy and discussed zometa and calcium supplemenation 10/22  - Zometa first dose on 03/23/20.  Will need to monitor for recurrence of swelling when we give second dose since it occurred right after 1st dose.    ** He has a dental appointment 12/9 but just for impressions for partial implant (it is a non-invasive dental procedure as the implants are clip on with a retainer, not surgically attached, so ok to proceed with Zometa)   ??  Summary:   - Day 90 marrow today, chimerism studies and MRD testing sent.   - He would like Hickman removed. He has a port in place so this should not be an issue to get it removed. Will send request to get this scheduled next week after provider visit.   - Repeat C.diff testing today was negative.   - Follow up next week with Dr. Oswald Hillock.     I personally spent 60 minutes face-to-face and non-face-to-face in  the care of this patient, which includes all pre, intra, and post visit time on the date of service.        Almira Bar Adult Nurse Practitioner  05/03/2020  2:35 PM

## 2020-05-03 NOTE — Unmapped (Signed)
-  Please do not drive for 24 hours after the procedure if you received medications through your IV.  -Keep the biopsy site dry and covered for 24 hours.   -After 24 hours, you may remove the dressing and cover the site with a bandage.  -Call the clinic if you have: bleeding from the site that does not stop after 10 minutes of firm pressure, redness and/or puss drainage from the site, and fever.      BMT clinic: [phone]  Inpatient unit: [phone] (call this number if after hours)

## 2020-05-04 LAB — EBV QUANTITATIVE PCR, BLOOD: EBV VIRAL LOAD RESULT: NOT DETECTED

## 2020-05-04 LAB — CMV DNA, QUANTITATIVE, PCR: CMV VIRAL LD: NOT DETECTED

## 2020-05-09 DIAGNOSIS — Z9484 Stem cells transplant status: Principal | ICD-10-CM

## 2020-05-10 ENCOUNTER — Encounter: Admit: 2020-05-10 | Discharge: 2020-05-10 | Payer: PRIVATE HEALTH INSURANCE

## 2020-05-10 DIAGNOSIS — N289 Disorder of kidney and ureter, unspecified: Principal | ICD-10-CM

## 2020-05-10 DIAGNOSIS — C9200 Acute myeloblastic leukemia, not having achieved remission: Principal | ICD-10-CM

## 2020-05-10 DIAGNOSIS — R197 Diarrhea, unspecified: Principal | ICD-10-CM

## 2020-05-10 DIAGNOSIS — Z9484 Stem cells transplant status: Principal | ICD-10-CM

## 2020-05-10 DIAGNOSIS — T865 Complications of stem cell transplant: Principal | ICD-10-CM

## 2020-05-10 DIAGNOSIS — Z880 Allergy status to penicillin: Principal | ICD-10-CM

## 2020-05-10 DIAGNOSIS — D849 Immunodeficiency, unspecified: Principal | ICD-10-CM

## 2020-05-10 DIAGNOSIS — R109 Unspecified abdominal pain: Principal | ICD-10-CM

## 2020-05-10 DIAGNOSIS — C9201 Acute myeloblastic leukemia, in remission: Principal | ICD-10-CM

## 2020-05-10 LAB — COMPREHENSIVE METABOLIC PANEL
ALBUMIN: 3.2 g/dL — ABNORMAL LOW (ref 3.4–5.0)
ALBUMIN: 3.3 g/dL — ABNORMAL LOW (ref 3.4–5.0)
ALKALINE PHOSPHATASE: 61 U/L (ref 46–116)
ALKALINE PHOSPHATASE: 65 U/L (ref 46–116)
ALT (SGPT): 12 U/L (ref 10–49)
ALT (SGPT): 9 U/L — ABNORMAL LOW (ref 10–49)
ANION GAP: 6 mmol/L (ref 5–14)
ANION GAP: 8 mmol/L (ref 5–14)
AST (SGOT): 17 U/L (ref <=34)
AST (SGOT): 16 U/L (ref <=34)
BILIRUBIN TOTAL: 0.3 mg/dL (ref 0.3–1.2)
BILIRUBIN TOTAL: 0.3 mg/dL (ref 0.3–1.2)
BLOOD UREA NITROGEN: 22 mg/dL (ref 9–23)
BLOOD UREA NITROGEN: 22 mg/dL (ref 9–23)
BUN / CREAT RATIO: 8
BUN / CREAT RATIO: 8
CALCIUM: 8.7 mg/dL (ref 8.7–10.4)
CALCIUM: 8.7 mg/dL (ref 8.7–10.4)
CHLORIDE: 106 mmol/L (ref 98–107)
CHLORIDE: 105 mmol/L (ref 98–107)
CO2: 26 mmol/L (ref 20.0–31.0)
CO2: 24 mmol/L (ref 20.0–31.0)
CREATININE: 2.89 mg/dL — ABNORMAL HIGH
CREATININE: 2.87 mg/dL — ABNORMAL HIGH
EGFR CKD-EPI AA MALE: 27 mL/min/{1.73_m2} — ABNORMAL LOW (ref >=60)
EGFR CKD-EPI AA MALE: 27 mL/min/{1.73_m2} — ABNORMAL LOW (ref >=60)
EGFR CKD-EPI NON-AA MALE: 23 mL/min/{1.73_m2} — ABNORMAL LOW (ref >=60)
EGFR CKD-EPI NON-AA MALE: 23 mL/min/{1.73_m2} — ABNORMAL LOW (ref >=60)
GLUCOSE RANDOM: 96 mg/dL (ref 70–179)
GLUCOSE RANDOM: 100 mg/dL (ref 70–179)
POTASSIUM: 3.9 mmol/L (ref 3.4–4.5)
POTASSIUM: 3.7 mmol/L (ref 3.4–4.5)
PROTEIN TOTAL: 5.5 g/dL — ABNORMAL LOW (ref 5.7–8.2)
PROTEIN TOTAL: 5.7 g/dL (ref 5.7–8.2)
SODIUM: 138 mmol/L (ref 135–145)
SODIUM: 137 mmol/L (ref 135–145)

## 2020-05-10 LAB — CBC W/ AUTO DIFF
BASOPHILS ABSOLUTE COUNT: 0 10*9/L (ref 0.0–0.1)
BASOPHILS RELATIVE PERCENT: 0.4 %
EOSINOPHILS ABSOLUTE COUNT: 0.1 10*9/L (ref 0.0–0.4)
EOSINOPHILS RELATIVE PERCENT: 1.9 %
HEMATOCRIT: 31.4 % — ABNORMAL LOW (ref 41.0–53.0)
HEMOGLOBIN: 10.2 g/dL — ABNORMAL LOW (ref 13.5–17.5)
LARGE UNSTAINED CELLS: 3 % (ref 0–4)
LYMPHOCYTES ABSOLUTE COUNT: 0.6 10*9/L — ABNORMAL LOW (ref 1.5–5.0)
LYMPHOCYTES RELATIVE PERCENT: 13.2 %
MEAN CORPUSCULAR HEMOGLOBIN CONC: 32.4 g/dL (ref 31.0–37.0)
MEAN CORPUSCULAR HEMOGLOBIN: 35.6 pg — ABNORMAL HIGH (ref 26.0–34.0)
MEAN CORPUSCULAR VOLUME: 109.9 fL — ABNORMAL HIGH (ref 80.0–100.0)
MEAN PLATELET VOLUME: 10.8 fL — ABNORMAL HIGH (ref 7.0–10.0)
MONOCYTES ABSOLUTE COUNT: 0.4 10*9/L (ref 0.2–0.8)
MONOCYTES RELATIVE PERCENT: 8.2 %
NEUTROPHILS ABSOLUTE COUNT: 3.1 10*9/L (ref 2.0–7.5)
NEUTROPHILS RELATIVE PERCENT: 73.8 %
PLATELET COUNT: 58 10*9/L — ABNORMAL LOW (ref 150–440)
RED BLOOD CELL COUNT: 2.86 10*12/L — ABNORMAL LOW (ref 4.50–5.90)
RED CELL DISTRIBUTION WIDTH: 14.6 % (ref 12.0–15.0)
WBC ADJUSTED: 4.2 10*9/L — ABNORMAL LOW (ref 4.5–11.0)

## 2020-05-10 LAB — MAGNESIUM: MAGNESIUM: 1.3 mg/dL — ABNORMAL LOW (ref 1.6–2.6)

## 2020-05-10 LAB — TACROLIMUS LEVEL: TACROLIMUS BLOOD: 4.2 ng/mL

## 2020-05-10 LAB — SLIDE REVIEW

## 2020-05-10 MED ORDER — MG-PLUS-PROTEIN 133 MG TABLET
ORAL_TABLET | Freq: Two times a day (BID) | ORAL | 3 refills | 135.00000 days | Status: CP
Start: 2020-05-10 — End: 2020-06-13

## 2020-05-10 MED ORDER — TRAZODONE 50 MG TABLET
ORAL_TABLET | Freq: Every evening | ORAL | 3 refills | 90 days | Status: CP | PRN
Start: 2020-05-10 — End: 2020-06-13

## 2020-05-10 MED ORDER — TACROLIMUS 0.5 MG CAPSULE, IMMEDIATE-RELEASE
ORAL_CAPSULE | Freq: Two times a day (BID) | ORAL | 5 refills | 30 days | Status: CP
Start: 2020-05-10 — End: 2020-06-07
  Filled 2020-05-11: qty 120, 30d supply, fill #0

## 2020-05-10 MED ADMIN — sodium chloride 0.9% (NS) bolus 1,000 mL: 1000 mL | INTRAVENOUS | @ 18:00:00 | Stop: 2020-05-10

## 2020-05-10 MED ADMIN — magnesium sulfate 2gm/50mL IVPB: 2 g | INTRAVENOUS | @ 18:00:00 | Stop: 2020-05-10

## 2020-05-10 MED ADMIN — heparin, porcine (PF) 100 unit/mL injection 500 Units: 500 [IU] | INTRAVENOUS | @ 20:00:00 | Stop: 2020-05-10

## 2020-05-10 MED ADMIN — heparin, porcine (PF) 100 unit/mL injection 500 Units: 500 [IU] | INTRAVENOUS | @ 15:00:00 | Stop: 2020-05-10

## 2020-05-10 NOTE — Unmapped (Addendum)
You have high creatinine, which means that your kidney function is impaired.   This is most likely due high tacrolimus level and poor hydration.   Please do not take any tacrolimus until further notice.    We will schedule you to come to our infusion suite every day for IV fluids and monitoring until resolution of renal insufficiency. We anticipate rapid improvement in kidney function with holding tacrolimus and hydration. If not observed we may need to perform further work up.      Please make sure you drink at least 2 liters and preferably 3 liters of fluids a day.       Increase the dose of magnesium to two capsules a day.       Results for orders placed or performed in visit on 05/10/20   Comprehensive Metabolic Panel   Result Value Ref Range    Sodium 137 135 - 145 mmol/L    Potassium 3.7 3.4 - 4.5 mmol/L    Chloride 105 98 - 107 mmol/L    Anion Gap 8 5 - 14 mmol/L    CO2 24.0 20.0 - 31.0 mmol/L    BUN 22 9 - 23 mg/dL    Creatinine 5.40 (H) 0.60 - 1.10 mg/dL    BUN/Creatinine Ratio 8     EGFR CKD-EPI Non-African American, Male 23 (L) >=60 mL/min/1.71m2    EGFR CKD-EPI African American, Male 27 (L) >=60 mL/min/1.41m2    Glucose 100 70 - 179 mg/dL    Calcium 8.7 8.7 - 98.1 mg/dL    Albumin 3.3 (L) 3.4 - 5.0 g/dL    Total Protein 5.7 5.7 - 8.2 g/dL    Total Bilirubin 0.3 0.3 - 1.2 mg/dL    AST 16 <=19 U/L    ALT 9 (L) 10 - 49 U/L    Alkaline Phosphatase 65 46 - 116 U/L

## 2020-05-10 NOTE — Unmapped (Signed)
See same day documentation note.

## 2020-05-10 NOTE — Unmapped (Signed)
Patient treatment visit uneventful, tolerated well. CVC lumen/s hep flushed and clamped prior to leaving. Patient left without questions/concerns.

## 2020-05-10 NOTE — Unmapped (Signed)
Bone Marrow Transplant and Cellular Therapy Program  Immunosuppressive Therapy Note    John Erickson is a 59 y.o. 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 John Erickson is currently day +98.    Current dose: tacrolimus 1 mg in AM and 1.5 in PM     Goal tacrolimus Level: 3-5 ng/mL    Resulted level: 4.2 ng/mL     Lab Results   Component Value Date/Time    TACROLIMUS 4.2 05/10/2020 09:54 AM    TACROLIMUS 4.5 05/03/2020 10:50 AM    TACROLIMUS 6.5 04/26/2020 09:22 AM    TACROLIMUS 4.3 (L) 04/21/2020 07:52 AM    TACROLIMUS 2.7 (L) 04/19/2020 09:50 AM     Lab Results   Component Value Date/Time    CREATININE 2.87 (H) 05/10/2020 10:58 AM    CREATININE 2.89 (H) 05/10/2020 09:54 AM    CREATININE 1.18 (H) 05/03/2020 10:50 AM     Recommendation: Tacrolimus level was drawn mid morning and true trough is still likely closer to 5. His creatinine has worsened significantly from last week. LFTs stable/WNL.  Discussed with Dr. Oswald Hillock and given worsening SCr no GVHD at this time, will allow for trough to run closer to 3-5 ng/mL.      Called pt to alert of this change however received VM with full mailbox.  Called fiancee who expressed frustration with changes in schedule required to accommodate hydration appoints in infusion.  Explained that the # of visits required for hydration/SCr monitoring will be dependent on SCr trend over the next few days.  Also told her that we would be calling to schedule a renal US for tomorrow.  Pt stated it has been a frustrating day and needed to get off the phone at that time.      Updated Rx for tacro sent to [facility] per pt request.      We will continue to monitor levels. Patient will be followed for changes in renal and hepatic function, toxicity, and efficacy.     Bettey Costa, PharmD, BCPS, BCOP  BMT Clinical Pharmacist Practitioner

## 2020-05-10 NOTE — Unmapped (Signed)
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  Transplant was tolerated well, complicated by mucositis requiring PCA.    Interval History:   John Erickson presents to clinic for follow up visit.   He was diagnosed with C.Diff on 12/1 and completed his course of oral vancomycin. During his last visit here he still c/o of some loose bowel movements but all these since resolved. Today he reports feeling very well. He is eating well, denies N/V/D or any type of abdominal discomfort.  He admits to not drinking much because it is not a habit of his but is eating well.     He denies peripheral edema although legs are still swollen. Having said that, his face does look back to baseline.     He denies any fevers, chills, sob or cough. He has no signs or symptoms of GVHD, specifically no rash, oral, occular, joint pain.     ROS:  A comprehensive ROS performed and is negative except for pertinent positives as listed above in interval history.     I reviewed and updated past medical, surgical, social, and family history as appropriate.     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,XY[1]    Variants of Known or Likely Clinical Significance  Gene Transcript  Predicted Protein  VAF (%)   CEBPA c.[id number] p.Ser251Argfs 41      Variants of Unknown Significance  Gene Transcript Predicted Protein VAF (%)   TET2 c.4946A>G p.Tyr1649Cys 49      -  FLT-3-ITD and FLT-3-TKD studies are negative    Pertinent Phenotypic data:    Disease-specific prognostic estimate:     Induction:   7+3+HD Dauno (C1D1 2/24)    Recovery Marrow:    Final Diagnosis   Date Value Ref Range Status   08/12/2019   Final    Bone marrow, right iliac, aspiration and biopsy  -  Normocellular bone marrow (60%) with trilineage hematopoiesis and 4% blasts by manual aspirate differential  -  Routine cytogenetic analysis is pending  -  Flow cytometric MRD analysis is pending    This electronic signature is attestation that the pathologist personally reviewed the submitted material(s) and the final diagnosis reflects that evaluation.            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           Molecular Genetics: No results found for: MYELOIDMP       AML (acute myeloid leukemia) (  CMS-HCC)   07/09/2019 Biopsy    Bone marrow, left iliac, aspiration and biopsy  -  Hypercellular bone marrow (95%) involved by acute myeloid leukemia (42% blasts by manual aspirate differential)   Abnormal Karyotype: 46,Y,t(X;8)(q26;q11.2)[19]/46,XY[1]  Variants of Known or Likely Clinical Significance  Gene Transcript  Predicted Protein  VAF (%)   CEBPA c.753_762del p.Ser251Argfs 41      Variants of Unknown Significance  Gene Transcript Predicted Protein VAF (%)   TET2 c.4946A>G p.Tyr1649Cys 49      -  FLT-3-ITD and FLT-3-TKD studies are negative     07/09/2019 Initial Diagnosis    AML (acute myeloid leukemia) (CMS-HCC)     07/14/2019 - 07/20/2019 Chemotherapy    IP LEUKEMIA 7+3 HIGH DOSE DAUNOrubicin  DAUNOrubicin 90 mg/m2 IV on Days 1, 2, 3  Cytarabine 100 mg/m2 CIVI on Days 1 to 7     07/27/2019 Biopsy Bone marrow, right iliac, aspiration and biopsy  -  Hypocellular bone marrow (<5%) with treatment effect, markedly reduced trilineage hematopoiesis, and 3% blasts by manual aspirate differential (see Comment).     08/24/2019 - 09/28/2019 Chemotherapy    IP LEUKEMIA HIGH DOSE CYTARABINE CONSOLIDATION (3 G/M2)  cytarabine 3 g/m2 every 12 hours on days 1, 3 and 5 every 28 days     08/24/2019 - 12/03/2019 Chemotherapy    IP LEUKEMIA HIGH DOSE CYTARABINE CONSOLIDATION (3 G/M2) ON DAYS 1,2,3  cytarabine 3 g/m2 IV every 12 hours on days 1, 2, 3, every 28 days     01/19/2020 - 01/19/2020 Chemotherapy    BMT OP BUSULFAN TEST DOSE  Busulfan 0.8 mg/kg IV ONCE     01/26/2020 -  Chemotherapy    BMT IP MAC BUSULFAN / FLUDARABINE / rATG (MUD)   Busulfan IV Days -6 to -3  Fludarabine 40 mg/m2 IV Days -6 to -3  rATG 0.5 mg/kg IV Day -3, 1.5 mg/kg IV Day -2, 2.5 mg/kg IV Day -1       Physical exam:       BP (S) 151/101  - Pulse 77  - Temp 36.9 ??C (98.4 ??F) (Oral)  - Resp 16  - Wt 75.2 kg (165 lb 12.6 oz)  - SpO2 96%  - BMI 25.96 kg/m??   Wt: 172 lb  70, Cares for self; unable to carry on normal activity or to do active work (ECOG equivalent 1)    General: No acute distress noted.   Central Venous Access: Line c/d/i; No erythema, TTP, or drainage noted. Port not accesssed.   ENT: Moist mucous membranes. Oropharhynx without lesions, erythema or exudate.   Cardiovascular: Pulse normal rate, regularity and rhythm. S1 and S2 normal, without any murmur, rub, or gallop.  Lungs: Clear to auscultation bilaterally, without wheezes/crackles/rhonchi. Good air movement.   Skin: No new lesions. No rash or abrasions. Bronzy skin color.   Psychiatry: Alert and oriented to person, place, and time.   Gastrointestinal/Abdomen: Normoactive bowel sounds, abdomen soft, non-tender   Musculoskeletal/Extremities: FROM throughout. +1 BLE edema ankles/shins.     Neurologic: CNII-XII intact. Normal strength and sensation throughout    Labs:  I reviewed all labs from today in Epic. See EMR for lab results.    Lab Results   Component Value Date    WBC 4.1 (L) 05/03/2020    HGB 10.4 (L) 05/03/2020    HCT 31.7 (L) 05/03/2020    PLT 57 (L) 05/03/2020     Lab Results   Component Value Date    NA 143  05/03/2020    K 4.1 05/03/2020    CL 112 (H) 05/03/2020    CO2 26.0 05/03/2020    BUN 12 05/03/2020    CREATININE 1.18 (H) 05/03/2020    GLU 143 05/03/2020    CALCIUM 8.2 (L) 05/03/2020    MG 1.5 (L) 05/03/2020    PHOS 4.1 02/22/2020     Lab Results   Component Value Date    BILITOT 0.3 05/03/2020    BILIDIR 0.30 02/21/2020    PROT 5.1 (L) 05/03/2020    ALBUMIN 2.9 (L) 05/03/2020    ALT 15 05/03/2020    AST 26 05/03/2020    ALKPHOS 72 05/03/2020    GGT 21 01/26/2020     Lab Results   Component Value Date    PT 14.1 (H) 02/22/2020    INR 1.21 02/22/2020    APTT 33.2 02/17/2020     Assessment and Plan    BMT:??AML, CR MRD Negative  HCT-CI (age adjusted) 7??(psych, severe pulmonary dysfunction, and age)   Conditioning:??MAC Bu/Flu/ATG  Donor:??9/10, ABO O+, CMV+  Engraftment:??Date of last granix injection: 02/19/20    Chimerism:   - 04/07/20: 94% donor in CD3+ and >95% donor in UF.  Has Day 72 BmBx was scheduled for 12/8, but now moved to 12/15 since he was later arriving today.    - 05/03/20: Bone marrow biopsy confirms ongoing CR1 with MRD-. 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     GVHD prophylaxis:??  1.Tacrolimus being managed by pharmacy with a goal of 5-10 ng/mL.   05/10/20: The dose has been increased to aim for more therapeutic level - in face of persistent fluid retention and persistent GI symptoms.   Today tacrolimus level is pending. Cr is 2.89, which is new. We wonder if increase in tacrolimus level and his relatively poor oral intake is not responsible fot this. Will hold tacrolimus pending level.   2. Methotrexate??15 mg/m2 IVP on day +1 then 10 mg/m2 on days +3, +6 and +11  3. ATG per [facility] standard dosing was included  ??  Heme:??  Transfusion criteria:??1 unit of PRBCs for Hgb<7 and 1 unit of platelets for Plt <10K or bleeding.   -??No history of transfusion reactions.   - Plts decreased, but now slowly improving.  Possibly related to whichever medication caused edema or slow engraftment.     ID:??  Prophylaxis:  - Antiviral: Valtrex 500mg  daily   - Antifungal: None  - PJP: Begin Dapsone 100mg  PO daily (has Sulfa allergy) on 12/1. He has supply but still needs to start taking this.   - CMV D+/R+: Restarted Letermovir prophy on 12/1, 480mg  po daily, Continue through D+100.  *Monitor for swelling after restart. If CMV is negative this week and swelling around eyes remains, would favor  just discontinue med and monitor CMV.  - CMV was undetectable last week, repeat pending today, continue Letermovir for now.     C.Diff:   - Mild symptoms, positive on 12/1  - Oral Vancomycin (12/2-12/11) completed 10 day course  - Repeat C.diff 12/15 was negative    Vesicular lesions:  - Initial AC fossa lesion 10/25, lesions were migratory and vesicular in a linear fashion  - Swab and blood both negative for VZV or HSV.  Trial of treatment dose Valtex did not change rash.    - As of 11/16 lesions resolved and no further rash.   ??  Allergy:  - PCN allergy (hives) has tolerated Cefepime without issues  Seasonal Allergies:  - Flonase and Claritin daily prn.   ??  EBV:  - IgM Ab + 01/05/20 but viral load 01/05/20 not detected.   - Viral Load 12/8, not detected    *Full virals sent 11/9 given mild thrombocytopenia and rash this panel was negative.  ??  Renal:   AKI:   - 05/10/20: Scr 2.89 today. Will hold tacrolimus and start IVF. Patient was instructed to drink plenty of fluids, preferably more than 3L/day, if possible.    FEN:   Hypomagnesemia  - Mag Chelate 1 tablet daily.   ??  Hepatic:??  - No active issues.   - VOD prophylaxis with Actigall TID on hold since 11/17 due to swelling/rash.  Will not restart now that he is nearing day +100.     Pulm: DLCO 62%  - Former smoker, quit 01/12/20. Using nicotine patch currently. Has chronic dry cough but PFTs were acceptable.   07/24/19: CT chest w/ 0.6cm RUL nodules, no clear etiology.     ** Discussed with Dr. Oswald Hillock prior to transplant, ok to move forward, no plans to repeat unless new findings.       Neuro/Pain:??  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     Psychosocial:??  - Substance abuse (opioid history). Sober for 5 years.  - Followed by Frederik Schmidt in [facility], placed referral for [facility] while admitted, are not seeing inpatients, will follow- up with him post discharge.   - Insomnia: Continue Trazodone 50mg  nightly prn.     Bone health:  Dexa scan from 10/13 resulted and shows low bone density.   - Seen by pharmacy and discussed zometa and calcium supplemenation 10/22  - Zometa first dose on 03/23/20.  Will need to monitor for recurrence of swelling when we give second dose since it occurred right after 1st dose.    ** He has a dental appointment 12/9 but just for impressions for partial implant (it is a non-invasive dental procedure as the implants are clip on with a retainer, not surgically attached, so ok to proceed with Zometa)   ??  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   - Was scheduled for removal of Hickman catheter. He has a single lumen port in place.   However in light of acute renal insufficiency will delay pulling out Hickman .   - AKI: Will schedule for daily IVF until resolution of renal insufficiency. Will hold tacrolimus anticipating high trough.  If this is not the case and cr does not improve by tomorrow, he may need a work up including US of the kidneys and possible hospitalization.       I personally spent 90 minutes face-to-face and non-face-to-face in the care of this patient, which includes all pre, intra, and post visit time on the date of service.        Willaim Bane Adult Nurse Practitioner  05/10/2020  10:08 AM

## 2020-05-11 ENCOUNTER — Ambulatory Visit: Admit: 2020-05-11 | Discharge: 2020-05-12 | Payer: PRIVATE HEALTH INSURANCE

## 2020-05-11 ENCOUNTER — Encounter: Admit: 2020-05-11 | Discharge: 2020-05-12 | Payer: PRIVATE HEALTH INSURANCE

## 2020-05-11 DIAGNOSIS — Z9484 Stem cells transplant status: Principal | ICD-10-CM

## 2020-05-11 DIAGNOSIS — C9201 Acute myeloblastic leukemia, in remission: Principal | ICD-10-CM

## 2020-05-11 LAB — CBC W/ AUTO DIFF
BASOPHILS ABSOLUTE COUNT: 0 10*9/L (ref 0.0–0.1)
BASOPHILS RELATIVE PERCENT: 0.2 %
EOSINOPHILS ABSOLUTE COUNT: 0.2 10*9/L (ref 0.0–0.4)
EOSINOPHILS RELATIVE PERCENT: 3.5 %
HEMATOCRIT: 32.6 % — ABNORMAL LOW (ref 41.0–53.0)
HEMOGLOBIN: 10.3 g/dL — ABNORMAL LOW (ref 13.5–17.5)
LARGE UNSTAINED CELLS: 4 % (ref 0–4)
LYMPHOCYTES ABSOLUTE COUNT: 0.9 10*9/L — ABNORMAL LOW (ref 1.5–5.0)
LYMPHOCYTES RELATIVE PERCENT: 17.1 %
MEAN CORPUSCULAR HEMOGLOBIN CONC: 31.7 g/dL (ref 31.0–37.0)
MEAN CORPUSCULAR HEMOGLOBIN: 34.9 pg — ABNORMAL HIGH (ref 26.0–34.0)
MEAN CORPUSCULAR VOLUME: 110.2 fL — ABNORMAL HIGH (ref 80.0–100.0)
MEAN PLATELET VOLUME: 11.3 fL — ABNORMAL HIGH (ref 7.0–10.0)
MONOCYTES ABSOLUTE COUNT: 0.4 10*9/L (ref 0.2–0.8)
MONOCYTES RELATIVE PERCENT: 7.5 %
NEUTROPHILS ABSOLUTE COUNT: 3.6 10*9/L (ref 2.0–7.5)
NEUTROPHILS RELATIVE PERCENT: 68 %
PLATELET COUNT: 52 10*9/L — ABNORMAL LOW (ref 150–440)
RED BLOOD CELL COUNT: 2.96 10*12/L — ABNORMAL LOW (ref 4.50–5.90)
RED CELL DISTRIBUTION WIDTH: 14.7 % (ref 12.0–15.0)
WBC ADJUSTED: 5.2 10*9/L (ref 4.5–11.0)

## 2020-05-11 LAB — COMPREHENSIVE METABOLIC PANEL
ALBUMIN: 3 g/dL — ABNORMAL LOW (ref 3.4–5.0)
ALKALINE PHOSPHATASE: 66 U/L (ref 46–116)
ALT (SGPT): 13 U/L (ref 10–49)
ANION GAP: 8 mmol/L (ref 5–14)
AST (SGOT): 21 U/L (ref <=34)
BILIRUBIN TOTAL: 0.3 mg/dL (ref 0.3–1.2)
BLOOD UREA NITROGEN: 21 mg/dL (ref 9–23)
BUN / CREAT RATIO: 9
CALCIUM: 8.8 mg/dL (ref 8.7–10.4)
CHLORIDE: 107 mmol/L (ref 98–107)
CO2: 22 mmol/L (ref 20.0–31.0)
CREATININE: 2.39 mg/dL — ABNORMAL HIGH
EGFR CKD-EPI AA MALE: 34 mL/min/{1.73_m2} — ABNORMAL LOW (ref >=60)
EGFR CKD-EPI NON-AA MALE: 29 mL/min/{1.73_m2} — ABNORMAL LOW (ref >=60)
GLUCOSE RANDOM: 93 mg/dL (ref 70–179)
POTASSIUM: 3.8 mmol/L (ref 3.4–4.5)
PROTEIN TOTAL: 5.5 g/dL — ABNORMAL LOW (ref 5.7–8.2)
SODIUM: 137 mmol/L (ref 135–145)

## 2020-05-11 LAB — CMV DNA, QUANTITATIVE, PCR: CMV VIRAL LD: NOT DETECTED

## 2020-05-11 LAB — MAGNESIUM: MAGNESIUM: 1.8 mg/dL (ref 1.6–2.6)

## 2020-05-11 LAB — TACROLIMUS LEVEL, TROUGH: TACROLIMUS, TROUGH: 2.5 ng/mL — ABNORMAL LOW (ref 5.0–15.0)

## 2020-05-11 LAB — EBV QUANTITATIVE PCR, BLOOD: EBV VIRAL LOAD RESULT: NOT DETECTED

## 2020-05-11 MED ADMIN — sodium chloride 0.9% (NS) bolus 1,000 mL: 1000 mL | INTRAVENOUS | @ 16:00:00 | Stop: 2020-05-11

## 2020-05-11 MED ADMIN — heparin, porcine (PF) 100 unit/mL injection 200 Units: 200 [IU] | INTRAVENOUS | @ 17:00:00 | Stop: 2020-05-11

## 2020-05-11 MED ADMIN — heparin, porcine (PF) 100 unit/mL injection 200 Units: 200 [IU] | INTRAVENOUS | @ 15:00:00 | Stop: 2020-05-11

## 2020-05-11 MED FILL — TACROLIMUS 0.5 MG CAPSULE, IMMEDIATE-RELEASE: 30 days supply | Qty: 120 | Fill #0 | Status: AC

## 2020-05-11 NOTE — Unmapped (Signed)
Bone Marrow Transplant and Cellular Therapy Program  Immunosuppressive Therapy Note    [name] is a 59 y.o. 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 [name] is currently day +99.    Current dose: tacrolimus 1 mg BID (decreased 12/22)     Goal tacrolimus Level: 3-5 ng/mL (goal reduced 12/22 for worsening SCr and no signs/symptoms of GVHD at this time)    Resulted level: 2.5 ng/mL     Lab Results   Component Value Date/Time    TACROLIMUS 2.5 (L) 05/11/2020 10:00 AM    TACROLIMUS 4.2 05/10/2020 09:54 AM    TACROLIMUS 4.5 05/03/2020 10:50 AM    TACROLIMUS 6.5 04/26/2020 09:22 AM    TACROLIMUS 4.3 (L) 04/21/2020 07:52 AM     Lab Results   Component Value Date/Time    CREATININE 2.39 (H) 05/11/2020 10:00 AM    CREATININE 2.87 (H) 05/10/2020 10:58 AM    CREATININE 2.89 (H) 05/10/2020 09:54 AM     Recommendation: Tacrolimus level was drawn mid morning and true trough is likely closer to 3 and at the lower end of his new target goal range. Level has downtrended some following dose decrease made yesterday, which is not at steady state yet. He currently has an AKI and his creatinine has improved from yesterday (2.87 down to 2.39). He is receiving daily IVFs in the infusion center. LFTs stable/WNL. Given the above, will continue current dose of 1 mg PO BID for now. Anticipate he may need dose increase in the next few days if his renal function continues to improve. Plan to recheck level at next visit tomorrow on 12/24.     We will continue to monitor levels. Patient will be followed for changes in renal and hepatic function, toxicity, and efficacy.     Dallas Schimke, PharmD, CPP  BMT Clinical Pharmacist Practitioner

## 2020-05-11 NOTE — Unmapped (Signed)
Patient treatment visit uneventful, tolerated well. CVC lumen/s hep flushed and clamped prior to leaving. Patient left without questions/concerns.

## 2020-05-11 NOTE — Unmapped (Signed)
[facility] Refill Coordination Note    Specialty Medication(s) to be Shipped:   Transplant: tacrolimus 0.5mg     Other medication(s) to be shipped: No additional medications requested for fill at this time   **PT's wife denied all other meds       John Erickson, DOB: 01/07/1961  Phone: [phone] (home)       All above HIPAA information was verified with patient's caregiver, wife     Was a translator used for this call? No    Completed refill call assessment today to schedule patient's medication shipment from the [facility] [phone]).       Specialty medication(s) and dose(s) confirmed: Tacrolimus dose change to 2 CAPS BID   Changes to medications: [name] reports no changes at this time.  Changes to insurance: No  Questions for the pharmacist: No    Confirmed patient received Welcome Packet with first shipment. The patient will receive a drug information handout for each medication shipped and additional FDA Medication Guides as required.       DISEASE/MEDICATION-SPECIFIC INFORMATION        N/A    SPECIALTY MEDICATION ADHERENCE     Medication Adherence    Patient reported X missed doses in the last month: 0                Tacrolimus 0.5mg : 0 days worth of medication on hand.          SHIPPING     Shipping address confirmed in Epic.     Delivery Scheduled: Yes, Expected medication delivery date: 05/11/20.     Medication will be delivered via Same Day Courier to the prescription address in Epic WAM.    [place] A Mert Dietrick   [facility] Specialty Technician

## 2020-05-11 NOTE — Unmapped (Signed)
Per Morrie Sheldon, NP, give IVF today per therapy plan for elevated cr.

## 2020-05-11 NOTE — Unmapped (Signed)
Attempted to call patient about 12/24 infusion appointment. Patient did not answer and the mailbox was full.    John Erickson

## 2020-05-11 NOTE — Unmapped (Signed)
Clinical Assessment Needed For: Dose Change  Medication: Tacrolimus 0.5mg  capsule  Last Fill Date/Day Supply: 02/23/20 / 30 days  Copay $0  Was previous dose already scheduled to fill: No    Notes to Pharmacist:

## 2020-05-12 DIAGNOSIS — Z9484 Stem cells transplant status: Principal | ICD-10-CM

## 2020-05-12 MED ORDER — LETERMOVIR 480 MG TABLET
ORAL_TABLET | Freq: Every day | ORAL | 2 refills | 28.00000 days | Status: CN
Start: 2020-05-12 — End: ?

## 2020-05-14 DIAGNOSIS — G5793 Unspecified mononeuropathy of bilateral lower limbs: Principal | ICD-10-CM

## 2020-05-14 DIAGNOSIS — Z9484 Stem cells transplant status: Principal | ICD-10-CM

## 2020-05-14 MED ORDER — PREGABALIN 75 MG CAPSULE
ORAL_CAPSULE | ORAL | 0 refills | 30 days | Status: CN
Start: 2020-05-14 — End: ?

## 2020-05-14 NOTE — Unmapped (Signed)
Bone Marrow Transplant and Cellular Therapy Program  Telephone Note    John Erickson  [phone]    Oncologic diagnosis:   AML     Reason for call: John Erickson (fiance of patient John Erickson) returning call about missed infusion appointments and request by medical team to call back to re-schedule    ROS:  (not required)    Current Outpatient Medications   Medication Sig Dispense Refill   ??? calcium carbonate (OS-CAL) 1,500 mg (600 mg elem calcium) tablet Take 1 tablet by mouth Two (2) times a day.     ??? cholecalciferol, vitamin D3-25 mcg, 1,000 unit,, (VITAMIN D3) 25 mcg (1,000 unit) capsule Take 25 mcg by mouth daily.      ??? dapsone 100 MG tablet Take 1 tablet (100 mg total) by mouth daily. 30 tablet 5   ??? letermovir (PREVYMIS) 480 mg tablet Take 1 tablet (480 mg total) by mouth daily. 28 tablet 2   ??? loratadine (CLARITIN) 10 mg tablet Take 1 tablet (10 mg total) by mouth daily. 30 tablet 2   ??? magnesium oxide-Mg AA chelate (MAGNESIUM, AMINO ACID CHELATE,) 133 mg Tab Take 1 tablet by mouth two (2) times a day. 270 tablet 3   ??? ondansetron (ZOFRAN) 8 MG tablet Take 1 tablet (8 mg total) by mouth every eight (8) hours as needed for nausea. 30 tablet 2   ??? pregabalin (LYRICA) 75 MG capsule Take 1 capsule (75 mg total) by mouth every morning AND 2 capsules (150 mg total) every evening. 90 capsule 0   ??? promethazine (PHENERGAN) 25 MG tablet Take 1 tablet (25 mg total) by mouth every six (6) hours as needed for nausea. 30 tablet 1   ??? tacrolimus (PROGRAF) 0.5 MG capsule Take 2 capsules (1 mg total) by mouth two (2) times a day. 120 capsule 5   ??? traZODone (DESYREL) 50 MG tablet Take 1 tablet (50 mg total) by mouth nightly as needed for sleep (insomnia). 90 tablet 3   ??? valACYclovir (VALTREX) 500 MG tablet Take 1 tablet (500 mg total) by mouth daily. 30 tablet 0     No current facility-administered medications for this visit.     Allergies   Allergen Reactions   ??? Penicillins Hives   ??? Sulfa (Sulfonamide Antibiotics) Other (See Comments) and Rash     Unknown    Other reaction(s): Unknown       Lab Results   Component Value Date    WBC 5.2 05/11/2020    HGB 10.3 (L) 05/11/2020    HCT 32.6 (L) 05/11/2020    PLT 52 (L) 05/11/2020       Lab Results   Component Value Date    NA 137 05/11/2020    K 3.8 05/11/2020    CL 107 05/11/2020    CO2 22.0 05/11/2020    BUN 21 05/11/2020    CREATININE 2.39 (H) 05/11/2020    GLU 93 05/11/2020    CALCIUM 8.8 05/11/2020    MG 1.8 05/11/2020    PHOS 4.1 02/22/2020       Lab Results   Component Value Date    BILITOT 0.3 05/11/2020    BILIDIR 0.30 02/21/2020    PROT 5.5 (L) 05/11/2020    ALBUMIN 3.0 (L) 05/11/2020    ALT 13 05/11/2020    AST 21 05/11/2020    ALKPHOS 66 05/11/2020    GGT 21 01/26/2020       Lab Results   Component Value Date  INR 1.21 02/22/2020    APTT 33.2 02/17/2020       Assessment and plan:  John Erickson was seen in infusion suite last Thursday 12/23 for iv fluids and lab follow up after being seen in clinic Wednesday 12/22 with acute rise in Serum creatinine from 1.18 at previous visit to 2.89. He received iv fluids both Wednesday and Thursday in clinic. His dose of tacrolimus was also held, but level was actually low. His dose was resumed at 1 mg twice daily on 12/23 as level had trended down. Goal for tac is (3-5) now that he is Day +102 with no s/s of GVHD and with worsening AKI. Plan was for him to be seen in infusion over holiday weekend on Friday 12/24 and Sunday 12/26 for further iv fluids and to re-evaluate his kidney function. He did not come to appointment on 12/24. Message was left by BMT team to call to reschedule. His fiance John called unit today to speak to me.   His fiance notes that John Erickson was very frustrated with his last BMT clinic visit on Tuesday. He was scheduled to have his Hickman line removed and this was delayed due to his kidney function being worse and him needing iv fluids that day. He also is frustrated that he did not receive or discuss the results of his bone marrow biopsy completed the previous week in clinic on Tuesday.  I reviewed this with his fiance over the phone today, that there was no evidence of leukemia, the minimal residual disease testing looked good with no evidence of his leukemia, and he continues to show full donor cell engraftment. I had also sent the patient a myChart message on 12/17 with preliminary results that were back at that point.   I also discussed the worry about his kidney function and need to come in to evaluate further and get more labs/possible fluids. Not following his labs closely could risk further damage to his kidney and potential needs for further interventions, appointments and referrals to kidney specialist.   Per his fiance, John Erickson does not want to come back in for labs/visit until he can have his hickman line removed. I will talk with Dr. Oswald Hillock and we can re-schedule the line removal, but he should not wait until this is done to return for labs. I re-iterated the need to check his lab and tomorrow would be my recommendation for him to come back in to clinic as we have not evaluated his kidney function or labs since Thursday last week. John will continue to try to persuade him and she will call back to clinic tomorrow morning. In the meantime I will send another request to have his line removal scheduled, he does have a port we can use for infusions if needed. However, he should not wait for this procedure to be done before we see him again or he is risking further damage to his kidney function that we could potentially mitigate .     Disposition:  Request sent to front desk to schedule for labs and infusion visit on Monday 12/27 and request also sent to reschedule line removal ( it was re-iterated that I have no control over getting this scheduled and my recommendation is come tomorrow for labs to evaluate kidney function)     I spent 25 minutes assessing problem and developing a plan.    Almira Bar, ANP  [place] [facility]

## 2020-05-15 DIAGNOSIS — Z9484 Stem cells transplant status: Principal | ICD-10-CM

## 2020-05-15 MED ORDER — LETERMOVIR 480 MG TABLET
ORAL_TABLET | Freq: Every day | ORAL | 2 refills | 28 days | Status: CN
Start: 2020-05-15 — End: ?

## 2020-05-18 ENCOUNTER — Encounter: Admit: 2020-05-18 | Discharge: 2020-05-18 | Payer: PRIVATE HEALTH INSURANCE

## 2020-05-18 DIAGNOSIS — Z452 Encounter for adjustment and management of vascular access device: Principal | ICD-10-CM

## 2020-05-18 DIAGNOSIS — Z9484 Stem cells transplant status: Principal | ICD-10-CM

## 2020-05-18 DIAGNOSIS — Z856 Personal history of leukemia: Principal | ICD-10-CM

## 2020-05-18 DIAGNOSIS — C9201 Acute myeloblastic leukemia, in remission: Principal | ICD-10-CM

## 2020-05-18 DIAGNOSIS — G5793 Unspecified mononeuropathy of bilateral lower limbs: Principal | ICD-10-CM

## 2020-05-18 LAB — COMPREHENSIVE METABOLIC PANEL
ALBUMIN: 3 g/dL — ABNORMAL LOW (ref 3.4–5.0)
ALKALINE PHOSPHATASE: 61 U/L (ref 46–116)
ALT (SGPT): 7 U/L — ABNORMAL LOW (ref 10–49)
ANION GAP: 5 mmol/L (ref 5–14)
AST (SGOT): 18 U/L (ref <=34)
BILIRUBIN TOTAL: 0.3 mg/dL (ref 0.3–1.2)
BLOOD UREA NITROGEN: 10 mg/dL (ref 9–23)
BUN / CREAT RATIO: 8
CALCIUM: 8 mg/dL — ABNORMAL LOW (ref 8.7–10.4)
CHLORIDE: 113 mmol/L — ABNORMAL HIGH (ref 98–107)
CO2: 26 mmol/L (ref 20.0–31.0)
CREATININE: 1.33 mg/dL — ABNORMAL HIGH
EGFR CKD-EPI AA MALE: 68 mL/min/{1.73_m2} (ref >=60)
EGFR CKD-EPI NON-AA MALE: 59 mL/min/{1.73_m2} — ABNORMAL LOW (ref >=60)
GLUCOSE RANDOM: 110 mg/dL (ref 70–179)
POTASSIUM: 3.9 mmol/L (ref 3.4–4.5)
PROTEIN TOTAL: 5.3 g/dL — ABNORMAL LOW (ref 5.7–8.2)
SODIUM: 144 mmol/L (ref 135–145)

## 2020-05-18 LAB — CBC W/ AUTO DIFF
BASOPHILS ABSOLUTE COUNT: 0 10*9/L (ref 0.0–0.1)
BASOPHILS RELATIVE PERCENT: 0.3 %
EOSINOPHILS ABSOLUTE COUNT: 0.1 10*9/L (ref 0.0–0.4)
EOSINOPHILS RELATIVE PERCENT: 1.6 %
HEMATOCRIT: 31 % — ABNORMAL LOW (ref 41.0–53.0)
HEMOGLOBIN: 10 g/dL — ABNORMAL LOW (ref 13.5–17.5)
LARGE UNSTAINED CELLS: 4 % (ref 0–4)
LYMPHOCYTES ABSOLUTE COUNT: 0.7 10*9/L — ABNORMAL LOW (ref 1.5–5.0)
LYMPHOCYTES RELATIVE PERCENT: 18 %
MEAN CORPUSCULAR HEMOGLOBIN CONC: 32.3 g/dL (ref 31.0–37.0)
MEAN CORPUSCULAR HEMOGLOBIN: 35 pg — ABNORMAL HIGH (ref 26.0–34.0)
MEAN CORPUSCULAR VOLUME: 108.5 fL — ABNORMAL HIGH (ref 80.0–100.0)
MEAN PLATELET VOLUME: 12 fL — ABNORMAL HIGH (ref 7.0–10.0)
MONOCYTES ABSOLUTE COUNT: 0.3 10*9/L (ref 0.2–0.8)
MONOCYTES RELATIVE PERCENT: 6.5 %
NEUTROPHILS ABSOLUTE COUNT: 2.7 10*9/L (ref 2.0–7.5)
NEUTROPHILS RELATIVE PERCENT: 70 %
PLATELET COUNT: 60 10*9/L — ABNORMAL LOW (ref 150–440)
RED BLOOD CELL COUNT: 2.86 10*12/L — ABNORMAL LOW (ref 4.50–5.90)
RED CELL DISTRIBUTION WIDTH: 14.9 % (ref 12.0–15.0)
WBC ADJUSTED: 3.9 10*9/L — ABNORMAL LOW (ref 4.5–11.0)

## 2020-05-18 LAB — MAGNESIUM: MAGNESIUM: 1.5 mg/dL — ABNORMAL LOW (ref 1.6–2.6)

## 2020-05-18 MED ADMIN — heparin, porcine (PF) 100 unit/mL injection 500 Units: 500 [IU] | INTRAVENOUS | @ 19:00:00 | Stop: 2020-05-18

## 2020-05-18 NOTE — Unmapped (Signed)
VIR Post-Procedure Note    Procedure Name: IR REMOVE TUNNELED CATHETER [[id number]]    Pre-Op Diagnosis: AML    Post-Op Diagnosis: Same as pre-operative diagnosis    VIR Providers    Attending: Dr. Braulio Conte  Assistant: Dr. Nicholes Calamity    Description of procedure: Removal of tunneled left IJ central line    Sedation: None. 6 ml local lidocaine administered.    Estimated Blood Loss: approximately 1 mL  Specimens: None   Contrast: 0 mL  Complications: None      See detailed procedure note with images in PACS (IMPAX).    The patient tolerated the procedure well without incident or complication and was returned to the home in stable condition.    Nicholes Calamity, MD  05/18/2020 1:25 PM

## 2020-05-18 NOTE — Unmapped (Signed)
Line removed using sterile technique by Dr. Ronelle Nigh. Sterile dressing applied. Discharge instructions reviewed and given to pt. Understanding verbalized. Home supplies given for dressing changes. Pt ambulatory without assistance. Discharged from PRU in stable condition.

## 2020-05-19 LAB — CMV DNA, QUANTITATIVE, PCR: CMV VIRAL LD: NOT DETECTED

## 2020-05-19 LAB — TACROLIMUS LEVEL: TACROLIMUS BLOOD: 2.4 ng/mL

## 2020-05-19 MED ORDER — PROMETHAZINE 25 MG TABLET
ORAL_TABLET | Freq: Four times a day (QID) | ORAL | 1 refills | 8 days | PRN
Start: 2020-05-19 — End: 2020-06-18

## 2020-05-19 NOTE — Unmapped (Addendum)
[facility]  Immunosuppressive Therapy Note    John Erickson is a 59 y.o. 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 John Erickson is currently day +107.    Current dose: tacrolimus 1 mg BID (decreased 12/22)     Goal tacrolimus Level: 3-5 ng/mL (goal reduced 12/22 for worsening SCr and no signs/symptoms of GVHD at this time)    Resulted level: 2.4 ng/mL, drawn at 1412 (as of 12/30)    Lab Results   Component Value Date/Time    TACROLIMUS 2.4 05/18/2020 02:12 PM    TACROLIMUS 2.5 (L) 05/11/2020 10:00 AM    TACROLIMUS 4.2 05/10/2020 09:54 AM    TACROLIMUS 4.5 05/03/2020 10:50 AM    TACROLIMUS 6.5 04/26/2020 09:22 AM    TACROLIMUS 4.3 (L) 04/21/2020 07:52 AM     Lab Results   Component Value Date/Time    CREATININE 1.33 (H) 05/18/2020 02:12 PM    CREATININE 2.39 (H) 05/11/2020 10:00 AM    CREATININE 2.87 (H) 05/10/2020 10:58 AM     Recommendation:   Tacrolimus level on 12/30 was drawn ~5 hours after normal dosing time so true trough likely on higher end of target goal range or slightly >5 ng/mL. His Scr has significantly improved (down 1.33 from 2.39 on 12/23); LFTs stable/WNL. Given significant improvement in renal function and tacrolimus level may be slightly above goal range, which was reduced due to worsening Scr, will continue tacrolimus dose at 1 mg PO BID. Plan to recheck level at next clinic visit.    We will continue to monitor levels. Patient will be followed for changes in renal and hepatic function, toxicity, and efficacy.     Chilton Greathouse, PharmD  PGY-2 Hematology/Oncology Pharmacy Resident

## 2020-05-20 LAB — EBV QUANTITATIVE PCR, BLOOD: EBV VIRAL LOAD RESULT: NOT DETECTED

## 2020-05-20 MED ORDER — PREGABALIN 75 MG CAPSULE
ORAL_CAPSULE | ORAL | 0 refills | 30 days | Status: CP
Start: 2020-05-20 — End: 2020-06-13
  Filled 2020-05-22: qty 90, 30d supply, fill #0

## 2020-05-22 MED FILL — PREGABALIN 75 MG CAPSULE: 30 days supply | Qty: 90 | Fill #0 | Status: AC

## 2020-05-23 ENCOUNTER — Encounter: Admit: 2020-05-23 | Discharge: 2020-05-24 | Payer: PRIVATE HEALTH INSURANCE

## 2020-05-23 DIAGNOSIS — R300 Dysuria: Principal | ICD-10-CM

## 2020-05-23 LAB — URINALYSIS
BACTERIA: NONE SEEN /HPF
BILIRUBIN UA: NEGATIVE
BLOOD UA: NEGATIVE
GLUCOSE UA: NEGATIVE
HYALINE CASTS: 1 /LPF (ref 0–1)
KETONES UA: NEGATIVE
LEUKOCYTE ESTERASE UA: NEGATIVE
NITRITE UA: NEGATIVE
PH UA: 7 (ref 5.0–9.0)
PROTEIN UA: NEGATIVE
RBC UA: 1 /HPF (ref <=3)
SPECIFIC GRAVITY UA: 1.014 (ref 1.003–1.030)
SQUAMOUS EPITHELIAL: <1 /HPF (ref 0–5)
UROBILINOGEN UA: 0.2
WBC UA: 2 /HPF (ref <=2)

## 2020-05-23 NOTE — Unmapped (Signed)
Cassie called to report that John Erickson was having burning and frequency with urination, told her that we would need a urine sample to check for infection and/or viruses, she said they would come today, message sent to add nurse visit for urine sample, spoke with B. Wynona Neat and urine orders placed

## 2020-05-23 NOTE — Unmapped (Signed)
Urine collected and sent to lab.

## 2020-05-23 NOTE — Unmapped (Signed)
I spoke with patient John Erickson to confirm appointments on the following date(s): 05/31/2020.  Pt confirmed date and times.    Danford Bad

## 2020-05-24 NOTE — Unmapped (Signed)
Patient caregiver called requesting U/A results from yesterday.  Informed U/A does not appear to have UTI.  Patient in background reports that he has had prostatitis before and this felt same, but, that he got up in the middle of last night and urinated and since then has had no further symptoms.  He is pushing fluids.  Reinforced to call if reoccurs and will await culture results.  He will come to BMT or go locally if reoccurs.

## 2020-05-25 LAB — BK VIRUS QUANTITATIVE PCR, URINE: BK URINE RESULT: NOT DETECTED

## 2020-05-31 ENCOUNTER — Encounter: Admit: 2020-05-31 | Discharge: 2020-05-31 | Payer: PRIVATE HEALTH INSURANCE

## 2020-05-31 ENCOUNTER — Ambulatory Visit: Admit: 2020-05-31 | Discharge: 2020-05-31 | Payer: PRIVATE HEALTH INSURANCE

## 2020-05-31 DIAGNOSIS — C9201 Acute myeloblastic leukemia, in remission: Principal | ICD-10-CM

## 2020-05-31 DIAGNOSIS — N179 Acute kidney failure, unspecified: Principal | ICD-10-CM

## 2020-05-31 DIAGNOSIS — Z9484 Stem cells transplant status: Principal | ICD-10-CM

## 2020-05-31 LAB — COMPREHENSIVE METABOLIC PANEL
ALBUMIN: 3 g/dL — ABNORMAL LOW (ref 3.4–5.0)
ALKALINE PHOSPHATASE: 74 U/L (ref 46–116)
ALT (SGPT): 33 U/L (ref 10–49)
ANION GAP: 5 mmol/L (ref 5–14)
AST (SGOT): 25 U/L (ref <=34)
BILIRUBIN TOTAL: 0.4 mg/dL (ref 0.3–1.2)
BLOOD UREA NITROGEN: 14 mg/dL (ref 9–23)
BUN / CREAT RATIO: 11
CALCIUM: 8.2 mg/dL — ABNORMAL LOW (ref 8.7–10.4)
CHLORIDE: 108 mmol/L — ABNORMAL HIGH (ref 98–107)
CO2: 24 mmol/L (ref 20.0–31.0)
CREATININE: 1.25 mg/dL — ABNORMAL HIGH
EGFR CKD-EPI AA MALE: 73 mL/min/{1.73_m2} (ref >=60)
EGFR CKD-EPI NON-AA MALE: 64 mL/min/{1.73_m2} (ref >=60)
GLUCOSE RANDOM: 153 mg/dL (ref 70–179)
POTASSIUM: 3.7 mmol/L (ref 3.4–4.5)
PROTEIN TOTAL: 5.5 g/dL — ABNORMAL LOW (ref 5.7–8.2)
SODIUM: 137 mmol/L (ref 135–145)

## 2020-05-31 LAB — CBC W/ AUTO DIFF
BASOPHILS ABSOLUTE COUNT: 0 10*9/L (ref 0.0–0.1)
BASOPHILS RELATIVE PERCENT: 0.3 %
EOSINOPHILS ABSOLUTE COUNT: 0.1 10*9/L (ref 0.0–0.4)
EOSINOPHILS RELATIVE PERCENT: 2.7 %
HEMATOCRIT: 30.2 % — ABNORMAL LOW (ref 41.0–53.0)
HEMOGLOBIN: 9.6 g/dL — ABNORMAL LOW (ref 13.5–17.5)
LARGE UNSTAINED CELLS: 3 % (ref 0–4)
LYMPHOCYTES ABSOLUTE COUNT: 0.6 10*9/L — ABNORMAL LOW (ref 1.5–5.0)
LYMPHOCYTES RELATIVE PERCENT: 12.4 %
MEAN CORPUSCULAR HEMOGLOBIN CONC: 31.9 g/dL (ref 31.0–37.0)
MEAN CORPUSCULAR HEMOGLOBIN: 35.2 pg — ABNORMAL HIGH (ref 26.0–34.0)
MEAN CORPUSCULAR VOLUME: 110.4 fL — ABNORMAL HIGH (ref 80.0–100.0)
MEAN PLATELET VOLUME: 10.4 fL — ABNORMAL HIGH (ref 7.0–10.0)
MONOCYTES ABSOLUTE COUNT: 0.3 10*9/L (ref 0.2–0.8)
MONOCYTES RELATIVE PERCENT: 6.9 %
NEUTROPHILS ABSOLUTE COUNT: 3.4 10*9/L (ref 2.0–7.5)
NEUTROPHILS RELATIVE PERCENT: 75.1 %
PLATELET COUNT: 91 10*9/L — ABNORMAL LOW (ref 150–440)
RED BLOOD CELL COUNT: 2.74 10*12/L — ABNORMAL LOW (ref 4.50–5.90)
RED CELL DISTRIBUTION WIDTH: 15.7 % — ABNORMAL HIGH (ref 12.0–15.0)
WBC ADJUSTED: 4.6 10*9/L (ref 4.5–11.0)

## 2020-05-31 LAB — TACROLIMUS LEVEL: TACROLIMUS BLOOD: 3.6 ng/mL

## 2020-05-31 LAB — MAGNESIUM: MAGNESIUM: 1.6 mg/dL (ref 1.6–2.6)

## 2020-05-31 LAB — SLIDE REVIEW

## 2020-05-31 MED ADMIN — heparin, porcine (PF) 100 unit/mL injection 500 Units: 500 [IU] | INTRAVENOUS | @ 15:00:00 | Stop: 2020-05-31

## 2020-05-31 NOTE — Unmapped (Signed)
Bone Marrow Transplant [facility] Cellular Therapy Program  Immunosuppressive Therapy Note    [name] is a 60 y.o. 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 [name] is currently day +119.    Current dose: tacrolimus 1 mg BID (decreased 12/22)     Goal tacrolimus Level: 3-5 ng/mL (goal reduced 12/22 for worsening SCr and no signs/symptoms of GVHD at this time)    Resulted level: 3.6 ng/mL (drawn mid AM so likely closer to 4.5)    Lab Results   Component Value Date/Time    TACROLIMUS 3.6 05/31/2020 09:53 AM    TACROLIMUS 2.4 05/18/2020 02:12 PM    TACROLIMUS 2.5 (L) 05/11/2020 10:00 AM    TACROLIMUS 4.2 05/10/2020 09:54 AM    TACROLIMUS 6.5 04/26/2020 09:22 AM    TACROLIMUS 4.3 (L) 04/21/2020 07:52 AM     Lab Results   Component Value Date/Time    CREATININE 1.25 (H) 05/31/2020 09:53 AM    CREATININE 1.33 (H) 05/18/2020 02:12 PM    CREATININE 2.39 (H) 05/11/2020 10:00 AM     Recommendation: Tacrolimus level was drawn mid morning and true trough is likely closer to upper end of goal range.  SCr continues to improve.   Continue current dose of 1 mg PO BID.   LFTs remain stable/WNL    We will continue to monitor levels. Patient will be followed for changes in renal and hepatic function, toxicity, and efficacy.     Bettey Costa, PharmD, BCPS, BCOP  BMT Clinical Pharmacist Practitioner

## 2020-05-31 NOTE — Unmapped (Addendum)
All lab results last 24 hours:     Recent Results (from the past 24 hour(s))   Comprehensive Metabolic Panel    Collection Time: 05/31/20  9:53 AM   Result Value Ref Range    Sodium 137 135 - 145 mmol/L    Potassium 3.7 3.4 - 4.5 mmol/L    Chloride 108 (H) 98 - 107 mmol/L    Anion Gap 5 5 - 14 mmol/L    CO2 24.0 20.0 - 31.0 mmol/L    BUN 14 9 - 23 mg/dL    Creatinine 1.61 (H) 0.60 - 1.10 mg/dL    BUN/Creatinine Ratio 11     EGFR CKD-EPI Non-African American, Male 47 >=60 mL/min/1.59m2    EGFR CKD-EPI African American, Male 66 >=60 mL/min/1.30m2    Glucose 153 70 - 179 mg/dL    Calcium 8.2 (L) 8.7 - 10.4 mg/dL    Albumin 3.0 (L) 3.4 - 5.0 g/dL    Total Protein 5.5 (L) 5.7 - 8.2 g/dL    Total Bilirubin 0.4 0.3 - 1.2 mg/dL    AST 25 <=09 U/L    ALT 33 10 - 49 U/L    Alkaline Phosphatase 74 46 - 116 U/L   Magnesium Level    Collection Time: 05/31/20  9:53 AM   Result Value Ref Range    Magnesium 1.6 1.6 - 2.6 mg/dL   CBC w/ Differential    Collection Time: 05/31/20  9:53 AM   Result Value Ref Range    WBC 4.6 4.5 - 11.0 10*9/L    RBC 2.74 (L) 4.50 - 5.90 10*12/L    HGB 9.6 (L) 13.5 - 17.5 g/dL    HCT 60.4 (L) 54.0 - 53.0 %    MCV 110.4 (H) 80.0 - 100.0 fL    MCH 35.2 (H) 26.0 - 34.0 pg    MCHC 31.9 31.0 - 37.0 g/dL    RDW 98.1 (H) 19.1 - 15.0 %    MPV 10.4 (H) 7.0 - 10.0 fL    Platelet 91 (L) 150 - 440 10*9/L    Neutrophils % 75.1 %    Lymphocytes % 12.4 %    Monocytes % 6.9 %    Eosinophils % 2.7 %    Basophils % 0.3 %    Absolute Neutrophils 3.4 2.0 - 7.5 10*9/L    Absolute Lymphocytes 0.6 (L) 1.5 - 5.0 10*9/L    Absolute Monocytes 0.3 0.2 - 0.8 10*9/L    Absolute Eosinophils 0.1 0.0 - 0.4 10*9/L    Absolute Basophils 0.0 0.0 - 0.1 10*9/L    Large Unstained Cells 3 0 - 4 %    Macrocytosis Marked (A) Not Present    Hypochromasia Marked (A) Not Present         Things look great. We will see you back next week.   Ok to stop prevymis

## 2020-05-31 NOTE — Unmapped (Signed)
[facility] Refill Coordination Note    Specialty Medication(s) to be Shipped:   Transplant: tacrolimus 0.5mg     Other medication(s) to be shipped: TRAZODONE 50 MG      John Erickson, DOB: 08-03-1960  Phone: [phone] (home)       All above HIPAA information was verified with patient.     Was a Nurse, learning disability used for this call? No    Completed refill call assessment today to schedule patient's medication shipment from the [facility] [phone]).       Specialty medication(s) and dose(s) confirmed: Regimen is correct and unchanged.   Changes to medications: [name] reports no changes at this time.  Changes to insurance: No  Questions for the pharmacist: No    Confirmed patient received Welcome Packet with first shipment. The patient will receive a drug information handout for each medication shipped and additional FDA Medication Guides as required.       DISEASE/MEDICATION-SPECIFIC INFORMATION        N/A    SPECIALTY MEDICATION ADHERENCE     Medication Adherence    Patient reported X missed doses in the last month: 0  Specialty Medication: tacrolimus 0.5mg    Patient is on additional specialty medications: No  Informant: patient  Confirmed plan for next specialty medication refill: delivery by pharmacy  Refills needed for supportive medications: not needed          Refill Coordination    Has the Patients' Contact Information Changed: No  Is the Shipping Address Different: No           TACROLIMUS 0.5 mg: 8 days of medicine on hand         SHIPPING     Shipping address confirmed in Epic.     Delivery Scheduled: Yes, Expected medication delivery date: 1/19.  However, Rx request for refills was sent to the provider as there are none remaining.     Medication will be delivered via Same Day Courier to the prescription address in Epic WAM.    Jolene Schimke   [facility] Specialty Technician

## 2020-06-02 LAB — EBV QUANTITATIVE PCR, BLOOD: EBV VIRAL LOAD RESULT: NOT DETECTED

## 2020-06-02 NOTE — Unmapped (Signed)
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  Transplant was tolerated well, complicated by mucositis requiring PCA.    Interval History:   John Erickson presents to clinic for follow up visit. He overall feels pretty good.  He has improved performance status although not baseline.  He Is drinking more fluids.  He has been eating fairly well.    He has not had NVD.  He has swelling to legs.  Central line has been removed since his last visit.  The site is healing well.   He denies any occular or oral GVHD sx.  He asks about 24 caregiver release. His partner has been with him 24/7 and he would like to have a bit more room to be independent.  He feels capable of doing the things he needs to do.  He denies any fevers, chills, sob or cough. He has no signs or symptoms of GVHD, specifically no rash, oral, occular, joint pain.     ROS:  A comprehensive ROS performed and is negative except for pertinent positives as listed above in interval history.     I reviewed and updated past medical, surgical, social, and family history as appropriate.     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,XY[1]    Variants of Known or Likely Clinical Significance  Gene Transcript  Predicted Protein  VAF (%)   CEBPA c.753_762del p.Ser251Argfs 41      Variants of Unknown Significance  Gene Transcript Predicted Protein VAF (%)   TET2 c.4946A>G p.Tyr1649Cys 49      -  FLT-3-ITD and FLT-3-TKD studies are negative    Pertinent Phenotypic data:    Disease-specific prognostic estimate:     Induction:   7+3+HD Dauno (C1D1 2/24)    Recovery Marrow:    Final Diagnosis   Date Value Ref Range Status   08/12/2019   Final    Bone marrow, right iliac, aspiration and biopsy  -  Normocellular bone marrow (60%) with trilineage hematopoiesis and 4% blasts by manual aspirate differential  -  Routine cytogenetic analysis is pending  -  Flow cytometric MRD analysis is pending    This electronic signature is attestation that the pathologist personally reviewed the submitted material(s) and the final diagnosis reflects that evaluation.            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;q11.2)[19]/46,XY[1]  Variants of Known or Likely Clinical Significance  Gene Transcript  Predicted Protein  VAF (%)   CEBPA c.753_762del p.Ser251Argfs 41      Variants of Unknown Significance  Gene Transcript Predicted Protein VAF (%)   TET2 c.4946A>G p.Tyr1649Cys 49      -  FLT-3-ITD and FLT-3-TKD studies are negative     07/09/2019 Initial Diagnosis    AML (acute myeloid leukemia) (CMS-HCC)     07/14/2019 - 07/20/2019 Chemotherapy    IP LEUKEMIA 7+3 HIGH DOSE DAUNOrubicin  DAUNOrubicin 90 mg/m2 IV on Days 1, 2, 3  Cytarabine 100 mg/m2 CIVI on Days 1 to 7 07/27/2019 Biopsy    Bone marrow, right iliac, aspiration and biopsy  -  Hypocellular bone marrow (<5%) with treatment effect, markedly reduced trilineage hematopoiesis, and 3% blasts by manual aspirate differential (see Comment).     08/24/2019 - 09/28/2019 Chemotherapy    IP LEUKEMIA HIGH DOSE CYTARABINE CONSOLIDATION (3 G/M2)  cytarabine 3 g/m2 every 12 hours on days 1, 3 and 5 every 28 days     08/24/2019 - 12/03/2019 Chemotherapy    IP LEUKEMIA HIGH DOSE CYTARABINE CONSOLIDATION (3 G/M2) ON DAYS 1,2,3  cytarabine 3 g/m2 IV every 12 hours on days 1, 2, 3, every 28 days     01/19/2020 - 01/19/2020 Chemotherapy    BMT OP BUSULFAN TEST DOSE  Busulfan 0.8 mg/kg IV ONCE     01/26/2020 -  Chemotherapy    BMT IP MAC BUSULFAN / FLUDARABINE / rATG (MUD)   Busulfan IV Days -6 to -3  Fludarabine 40 mg/m2 IV Days -6 to -3  rATG 0.5 mg/kg IV Day -3, 1.5 mg/kg IV Day -2, 2.5 mg/kg IV Day -1       Physical exam:       BP 120/82  - Pulse 91  - Temp 36.8 ??C (98.2 ??F) (Oral)  - Resp 18  - Wt 74.8 kg (164 lb 12.8 oz)  - SpO2 97%  - BMI 25.81 kg/m??   80, Normal activity with effort; some signs or symptoms of disease (ECOG equivalent 1)    General: No acute distress noted.   Central Venous Access: Port accesssed.   ENT: Moist mucous membranes. Oropharhynx without lesions, erythema or exudate.   Cardiovascular: Pulse normal rate, regularity and rhythm. S1 and S2 normal, without any murmur, rub, or gallop.  Lungs: Clear to auscultation bilaterally, without wheezes/crackles/rhonchi. Good air movement.   Skin: No new lesions. No rash or abrasions. Bronzy skin color.   Psychiatry: Alert and oriented to person, place, and time.   Gastrointestinal/Abdomen: Normoactive bowel sounds, abdomen soft, non-tender   Musculoskeletal/Extremities: FROM throughout. +2 BLE edema ankles/shins.     Neurologic: CNII-XII intact. Normal strength and sensation throughout    Labs:  I reviewed all labs from today in Epic. See EMR for lab results.    Lab Results Component Value Date    WBC 4.6 05/31/2020    HGB 9.6 (L) 05/31/2020    HCT 30.2 (L) 05/31/2020    PLT 91 (L) 05/31/2020     Lab Results   Component Value Date    NA 137 05/31/2020    K 3.7 05/31/2020    CL 108 (H) 05/31/2020    CO2  24.0 05/31/2020    BUN 14 05/31/2020    CREATININE 1.25 (H) 05/31/2020    GLU 153 05/31/2020    CALCIUM 8.2 (L) 05/31/2020    MG 1.6 05/31/2020    PHOS 4.1 02/22/2020     Lab Results   Component Value Date    BILITOT 0.4 05/31/2020    BILIDIR 0.30 02/21/2020    PROT 5.5 (L) 05/31/2020    ALBUMIN 3.0 (L) 05/31/2020    ALT 33 05/31/2020    AST 25 05/31/2020    ALKPHOS 74 05/31/2020    GGT 21 01/26/2020     Lab Results   Component Value Date    PT 14.1 (H) 02/22/2020    INR 1.21 02/22/2020    APTT 33.2 02/17/2020     Assessment and Plan    BMT:??AML, CR MRD Negative  HCT-CI (age adjusted) 7??(psych, severe pulmonary dysfunction, and age)   Conditioning:??MAC Bu/Flu/ATG  Donor:??9/10, ABO O+, CMV+  Engraftment:??Date of last granix injection: 02/19/20    Chimerism:   - 04/07/20: 94% donor in CD3+ and >95% donor in UF.  Has Day 40 BmBx was scheduled for 12/8, but now moved to 12/15 since he was later arriving today.    - 05/03/20: Bone marrow biopsy confirms ongoing CR1 with MRD-. 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     GVHD prophylaxis:??  1.Tacrolimus being managed by pharmacy with a goal of 5-10 ng/mL.   05/10/20: The dose has been increased to aim for more therapeutic level - in face of persistent fluid retention and persistent GI symptoms.   Today tacrolimus level is pending. Cr is 2.89, which is new. We wonder if increase in tacrolimus level and his relatively poor oral intake is not responsible fot this. Will hold tacrolimus pending level.     05/31/20: Dr. Oswald Hillock would like to start steroid taper next visit. He will decrease by 0.5 mg every 2 weeks:  1/19: 1 mg/0.5 mg  2/2:  0.5mg /0.5mg   2/16: 0.5 mg qPM  3/1: 0.5mg  every other PM  3/15: Stop    2. Methotrexate??15 mg/m2 IVP on day +1 then 10 mg/m2 on days +3, +6 and +11  3. ATG per [facility] standard dosing was included  ??  Heme:??  Transfusion criteria:??1 unit of PRBCs for Hgb<7 and 1 unit of platelets for Plt <10K or bleeding.   -??No history of transfusion reactions.   - Plts decreased, but now slowly improving.  Possibly related to whichever medication caused edema or slow engraftment.     ID:??  Prophylaxis:  - Antiviral: Valtrex 500mg  daily   - Antifungal: None  - PJP: Begin Dapsone 100mg  PO daily (has Sulfa allergy) on 12/1. He has supply but still needs to start taking this.   - CMV D+/R+: Restarted Letermovir prophy on 12/1, 480mg  po daily, Continue through D+100.  *Monitor for swelling after restart. If CMV is negative this week and swelling around eyes remains, would favor  just discontinue med and monitor CMV.  - CMV not detected. Can discontinue next visit.     C.Diff:   - Mild symptoms, positive on 12/1  - Oral Vancomycin (12/2-12/11) completed 10 day course  - Repeat C.diff 12/15 was negative    Vesicular lesions:  - Initial AC fossa lesion 10/25, lesions were migratory and vesicular in a linear fashion  - Swab and blood both negative for VZV or HSV.  Trial of treatment dose Valtex did not change rash.    - As of  11/16 lesions resolved and no further rash.   ??  Allergy:  - PCN allergy (hives) has tolerated Cefepime without issues    Seasonal Allergies:  - Flonase and Claritin daily prn.   ??  EBV:  - IgM Ab + 01/05/20 but viral load 01/05/20 not detected.   - Viral Load 12/8, not detected    *Full virals sent 11/9 given mild thrombocytopenia and rash this panel was negative.  ??  Renal:   AKI:   - 05/10/20: Scr 2.89 today. Will hold tacrolimus and start IVF. Patient was instructed to drink plenty of fluids, preferably more than 3L/day, if possible.  -06/01/20: Cr 1.2.    FEN:   Hypomagnesemia  - Mag Chelate 1 tablet daily.   ??  Hepatic:??  - No active issues.   - VOD prophylaxis with Actigall TID on hold since 11/17 due to swelling/rash.  Will not restart now that he is nearing day +100.     Pulm: DLCO 62%  - Former smoker, quit 01/12/20. Using nicotine patch currently. Has chronic dry cough but PFTs were acceptable.   07/24/19: CT chest w/ 0.6cm RUL nodules, no clear etiology.     ** Discussed with Dr. Oswald Hillock prior to transplant, ok to move forward, no plans to repeat unless new findings.       Neuro/Pain:??  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     Psychosocial:??  - Substance abuse (opioid history). Sober for 5 years.  - Followed by Frederik Schmidt in [facility], placed referral for [facility] while admitted, are not seeing inpatients, will follow- up with him post discharge.   - Insomnia: Continue Trazodone 50mg  nightly prn.     Bone health:  Dexa scan from 10/13 resulted and shows low bone density.   - Seen by pharmacy and discussed zometa and calcium supplemenation 10/22  - Zometa first dose on 03/23/20.  Will need to monitor for recurrence of swelling when we give second dose since it occurred right after 1st dose.    ** He has a dental appointment 12/9 but just for impressions for partial implant (it is a non-invasive dental procedure as the implants are clip on with a retainer, not surgically attached, so ok to proceed with Zometa)   ??  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   - AKI improving.   - Planning to taper tacrolimus at next visit.  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     I personally spent 60 minutes face-to-face and non-face-to-face in the care of this patient, which includes all pre, intra, and post visit time on the date of service.        Westley Hummer ANP-BC, AOCNP  [place] [facility]    Future Appointments   Date Time Provider Department Center   06/07/2020  2:15 PM [facility]CBMT APP B [facility]   07/04/2020 10:45 AM Artelia Laroche, MD [facility]

## 2020-06-02 NOTE — Unmapped (Signed)
Addended by: Billey Co F on: 06/02/2020 01:12 PM     Modules accepted: Orders

## 2020-06-03 LAB — CMV QUANTITATIVE PCR, BLOOD: CMV QUANT (MAYO): NOT DETECTED [IU]/mL

## 2020-06-07 ENCOUNTER — Encounter: Admit: 2020-06-07 | Discharge: 2020-06-08 | Payer: PRIVATE HEALTH INSURANCE

## 2020-06-07 DIAGNOSIS — Z9484 Stem cells transplant status: Principal | ICD-10-CM

## 2020-06-07 DIAGNOSIS — C9201 Acute myeloblastic leukemia, in remission: Principal | ICD-10-CM

## 2020-06-07 LAB — COMPREHENSIVE METABOLIC PANEL
ALBUMIN: 3.1 g/dL — ABNORMAL LOW (ref 3.4–5.0)
ALKALINE PHOSPHATASE: 62 U/L (ref 46–116)
ALT (SGPT): 8 U/L — ABNORMAL LOW (ref 10–49)
ANION GAP: 4 mmol/L — ABNORMAL LOW (ref 5–14)
AST (SGOT): 15 U/L (ref <=34)
BILIRUBIN TOTAL: 0.5 mg/dL (ref 0.3–1.2)
BLOOD UREA NITROGEN: 11 mg/dL (ref 9–23)
BUN / CREAT RATIO: 10
CALCIUM: 8.7 mg/dL (ref 8.7–10.4)
CHLORIDE: 108 mmol/L — ABNORMAL HIGH (ref 98–107)
CO2: 26 mmol/L (ref 20.0–31.0)
CREATININE: 1.07 mg/dL
EGFR CKD-EPI AA MALE: 89 mL/min/{1.73_m2} (ref >=60)
EGFR CKD-EPI NON-AA MALE: 77 mL/min/{1.73_m2} (ref >=60)
GLUCOSE RANDOM: 108 mg/dL (ref 70–179)
POTASSIUM: 4 mmol/L (ref 3.4–4.5)
PROTEIN TOTAL: 5.7 g/dL (ref 5.7–8.2)
SODIUM: 138 mmol/L (ref 135–145)

## 2020-06-07 LAB — MAGNESIUM: MAGNESIUM: 1.6 mg/dL (ref 1.6–2.6)

## 2020-06-07 LAB — CBC W/ AUTO DIFF
BASOPHILS ABSOLUTE COUNT: 0 10*9/L (ref 0.0–0.1)
BASOPHILS RELATIVE PERCENT: 0.3 %
EOSINOPHILS ABSOLUTE COUNT: 0 10*9/L (ref 0.0–0.4)
EOSINOPHILS RELATIVE PERCENT: 0.8 %
HEMATOCRIT: 28.7 % — ABNORMAL LOW (ref 41.0–53.0)
HEMOGLOBIN: 8.7 g/dL — ABNORMAL LOW (ref 13.5–17.5)
LARGE UNSTAINED CELLS: 3 % (ref 0–4)
LYMPHOCYTES ABSOLUTE COUNT: 0.5 10*9/L — ABNORMAL LOW (ref 1.5–5.0)
LYMPHOCYTES RELATIVE PERCENT: 13.7 %
MEAN CORPUSCULAR HEMOGLOBIN CONC: 30.4 g/dL — ABNORMAL LOW (ref 31.0–37.0)
MEAN CORPUSCULAR HEMOGLOBIN: 34.1 pg — ABNORMAL HIGH (ref 26.0–34.0)
MEAN CORPUSCULAR VOLUME: 112 fL — ABNORMAL HIGH (ref 80.0–100.0)
MEAN PLATELET VOLUME: 10.8 fL — ABNORMAL HIGH (ref 7.0–10.0)
MONOCYTES ABSOLUTE COUNT: 0.2 10*9/L (ref 0.2–0.8)
MONOCYTES RELATIVE PERCENT: 6.1 %
NEUTROPHILS ABSOLUTE COUNT: 2.9 10*9/L (ref 2.0–7.5)
NEUTROPHILS RELATIVE PERCENT: 75.9 %
PLATELET COUNT: 96 10*9/L — ABNORMAL LOW (ref 150–440)
RED BLOOD CELL COUNT: 2.56 10*12/L — ABNORMAL LOW (ref 4.50–5.90)
RED CELL DISTRIBUTION WIDTH: 17.6 % — ABNORMAL HIGH (ref 12.0–15.0)
WBC ADJUSTED: 3.9 10*9/L — ABNORMAL LOW (ref 4.5–11.0)

## 2020-06-07 MED ORDER — TACROLIMUS 0.5 MG CAPSULE, IMMEDIATE-RELEASE
ORAL_CAPSULE | 5 refills | 0 days
Start: 2020-06-07 — End: 2020-06-13

## 2020-06-07 MED ADMIN — heparin, porcine (PF) 100 unit/mL injection 500 Units: 500 [IU] | INTRAVENOUS | @ 20:00:00 | Stop: 2020-06-07

## 2020-06-07 NOTE — Unmapped (Addendum)
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  Transplant was tolerated well, complicated by mucositis requiring PCA.    Interval History:   John Erickson presents to clinic for follow up visit. He is doing well overall. His appetite is improving and he is drinking more fluids. He still has nausea occasionally and he takes phenergan a few times a week. He has regular bowel movement.  He goes to gym 4 times a week and is taking dance classes with his fiance'.   He denies any skin changes such as itching or rashes. Denies occular symptoms. Denies mouth sore (he did bite his the right side buccal mucosa a few days ago while eating). He still has swelling to his legs but no new onset joint pain.   He denies chest pain, shortness of breath, orthopnea, dizziness or syncope.  He denies  fevers, chills, cough, or other infectious symptoms.     ROS:  A comprehensive ROS performed and is negative except for pertinent positives as listed above in interval history.     I reviewed and updated past medical, surgical, social, and family history as appropriate.     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,XY[1]    Variants of Known or Likely Clinical Significance  Gene Transcript  Predicted Protein  VAF (%)   CEBPA c.[id number] p.Ser251Argfs 41      Variants of Unknown Significance  Gene Transcript Predicted Protein VAF (%)   TET2 c.4946A>G p.Tyr1649Cys 49      -  FLT-3-ITD and FLT-3-TKD studies are negative    Pertinent Phenotypic data:    Disease-specific prognostic estimate:     Induction:   7+3+HD Dauno (C1D1 2/24)    Recovery Marrow:    Final Diagnosis   Date Value Ref Range Status   08/12/2019   Final    Bone marrow, right iliac, aspiration and biopsy  -  Normocellular bone marrow (60%) with trilineage hematopoiesis and 4% blasts by manual aspirate differential  -  Routine cytogenetic analysis is pending  -  Flow cytometric MRD analysis is pending    This electronic signature is attestation that the pathologist personally reviewed the submitted material(s) and the final diagnosis reflects that evaluation.            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           Molecular Genetics: No results found for: MYELOIDMP       AML (acute myeloid  leukemia) (CMS-HCC)   07/09/2019 Biopsy    Bone marrow, left iliac, aspiration and biopsy  -  Hypercellular bone marrow (95%) involved by acute myeloid leukemia (42% blasts by manual aspirate differential)   Abnormal Karyotype: 46,Y,t(X;8)(q26;q11.2)[19]/46,XY[1]  Variants of Known or Likely Clinical Significance  Gene Transcript  Predicted Protein  VAF (%)   CEBPA c.753_762del p.Ser251Argfs 41      Variants of Unknown Significance  Gene Transcript Predicted Protein VAF (%)   TET2 c.4946A>G p.Tyr1649Cys 49      -  FLT-3-ITD and FLT-3-TKD studies are negative     07/09/2019 Initial Diagnosis    AML (acute myeloid leukemia) (CMS-HCC)     07/14/2019 - 07/20/2019 Chemotherapy    IP LEUKEMIA 7+3 HIGH DOSE DAUNOrubicin  DAUNOrubicin 90 mg/m2 IV on Days 1, 2, 3  Cytarabine 100 mg/m2 CIVI on Days 1 to 7     07/27/2019 Biopsy    Bone marrow, right iliac, aspiration and biopsy  -  Hypocellular bone marrow (<5%) with treatment effect, markedly reduced trilineage hematopoiesis, and 3% blasts by manual aspirate differential (see Comment).     08/24/2019 - 09/28/2019 Chemotherapy    IP LEUKEMIA HIGH DOSE CYTARABINE CONSOLIDATION (3 G/M2)  cytarabine 3 g/m2 every 12 hours on days 1, 3 and 5 every 28 days     08/24/2019 - 12/03/2019 Chemotherapy    IP LEUKEMIA HIGH DOSE CYTARABINE CONSOLIDATION (3 G/M2) ON DAYS 1,2,3  cytarabine 3 g/m2 IV every 12 hours on days 1, 2, 3, every 28 days     01/19/2020 - 01/19/2020 Chemotherapy    BMT OP BUSULFAN TEST DOSE  Busulfan 0.8 mg/kg IV ONCE     01/26/2020 -  Chemotherapy    BMT IP MAC BUSULFAN / FLUDARABINE / rATG (MUD)   Busulfan IV Days -6 to -3  Fludarabine 40 mg/m2 IV Days -6 to -3  rATG 0.5 mg/kg IV Day -3, 1.5 mg/kg IV Day -2, 2.5 mg/kg IV Day -1       Physical exam:  Temp:  [36.8 ??C (98.2 ??F)] 36.8 ??C (98.2 ??F)  Heart Rate:  [83] 83  BP: (115)/(81) 115/81 Temp:  [36.8 ??C (98.2 ??F)] 36.8 ??C (98.2 ??F)  Heart Rate:  [83] 83  BP: (115)/(81) 115/81  BP 115/81  - Pulse 83  - Temp 36.8 ??C (98.2 ??F) (Oral)  - Resp 18  - Ht 170.2 cm (5' 7.01)  - Wt 72.6 kg (160 lb)  - SpO2 97%  - BMI 25.05 kg/m??   80, Normal activity with effort; some signs or symptoms of disease (ECOG equivalent 1)    General: No acute distress noted.   Central Venous Access: Port accesssed.   ENT: Moist mucous membranes. Oropharhynx without lesions, erythema or exudate.   Cardiovascular: Pulse normal rate, regularity and rhythm. S1 and S2 normal, without any murmur, rub, or gallop.  Lungs: Clear to auscultation bilaterally, without wheezes/crackles/rhonchi. Good air movement.   Skin: No new lesions. No rash or abrasions. Bronzy skin color.   Psychiatry: Alert and oriented to person, place, and time.   Gastrointestinal/Abdomen: Normoactive bowel sounds, abdomen soft, non-tender   Musculoskeletal/Extremities: FROM throughout. +2 BLE edema ankles/shins.     Neurologic: CNII-XII intact. Normal strength and sensation throughout    Labs:  I reviewed all labs from today in Epic. See EMR for lab results.    Lab Results   Component Value Date    WBC 3.9 (L) 06/07/2020    HGB 8.7 (L) 06/07/2020  HCT 28.7 (L) 06/07/2020    PLT 96 (L) 06/07/2020     Lab Results   Component Value Date    NA 138 06/07/2020    K 4.0 06/07/2020    CL 108 (H) 06/07/2020    CO2 26.0 06/07/2020    BUN 11 06/07/2020    CREATININE 1.07 06/07/2020    GLU 108 06/07/2020    CALCIUM 8.7 06/07/2020    MG 1.6 06/07/2020    PHOS 4.1 02/22/2020     Lab Results   Component Value Date    BILITOT 0.5 06/07/2020    BILIDIR 0.30 02/21/2020    PROT 5.7 06/07/2020    ALBUMIN 3.1 (L) 06/07/2020    ALT 8 (L) 06/07/2020    AST 15 06/07/2020    ALKPHOS 62 06/07/2020    GGT 21 01/26/2020     Lab Results   Component Value Date    PT 14.1 (H) 02/22/2020    INR 1.21 02/22/2020    APTT 33.2 02/17/2020     Assessment and Plan    BMT:??AML, CR MRD Negative  HCT-CI (age adjusted) 5??(psych, severe pulmonary dysfunction, and age)   Conditioning:??MAC Bu/Flu/ATG  Donor:??9/10, ABO O+, CMV+  Engraftment:??Date of last granix injection: 02/19/20    Chimerism:   - 04/07/20: 94% donor in CD3+ and >95% donor in UF.  Has Day 8 BmBx was scheduled for 12/8, but now moved to 12/15 since he was later arriving today.    - 05/03/20: Bone marrow biopsy confirms ongoing CR1 with MRD-. 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     GVHD prophylaxis:??  1.Tacrolimus being managed by pharmacy with a goal of 5-10 ng/mL.   05/10/20: The dose has been increased to aim for more therapeutic level - in face of persistent fluid retention and persistent GI symptoms.   Today tacrolimus level is pending. Cr is 2.89, which is new. We wonder if increase in tacrolimus level and his relatively poor oral intake is not responsible fot this. Will hold tacrolimus pending level.     05/31/20: Dr. Oswald Hillock would like to start tacrolimus taper next visit. He will decrease by 0.5 mg every 2 weeks:  1/19: 1 mg/0.5 mg  2/2:  0.5mg /0.5mg   2/16: 0.5 mg qPM  3/1: 0.5mg  every other PM  3/15: Stop    06/07/2020: taper tacrolimus to 1mg /0.5mg     2. Methotrexate??15 mg/m2 IVP on day +1 then 10 mg/m2 on days +3, +6 and +11  3. ATG per [facility] standard dosing was included  ??  Heme:??  Transfusion criteria:??1 unit of PRBCs for Hgb<7 and 1 unit of platelets for Plt <10K or bleeding.   -??No history of transfusion reactions.   - Plts decreased, but now slowly improving.  Possibly related to whichever medication caused edema or slow engraftment.     ID:??  Prophylaxis:  - Antiviral: Valtrex 500mg  daily   - Antifungal: None  - PJP: Begin Dapsone 100mg  PO daily (has Sulfa allergy) on 12/1. He has supply but still needs to start taking this.   - CMV D+/R+: Restarted Letermovir prophy on 12/1, 480mg  po daily, Continue through D+100.  *Monitor for swelling after restart. If CMV is negative this week and swelling around eyes remains, would favor  just discontinue med and monitor CMV.  - CMV not detected. Can discontinue next visit.     C.Diff:   - Mild symptoms, positive on 12/1  - Oral Vancomycin (12/2-12/11) completed 10 day course  - Repeat C.diff  12/15 was negative    Vesicular lesions:  - Initial AC fossa lesion 10/25, lesions were migratory and vesicular in a linear fashion  - Swab and blood both negative for VZV or HSV.  Trial of treatment dose Valtex did not change rash.    - As of 11/16 lesions resolved and no further rash.   ??  Allergy:  - PCN allergy (hives) has tolerated Cefepime without issues    Seasonal Allergies:  - Flonase and Claritin daily prn.   ??  EBV:  - IgM Ab + 01/05/20 but viral load 01/05/20 not detected.   - Viral Load 12/8, not detected    *Full virals sent 11/9 given mild thrombocytopenia and rash this panel was negative.  ??  Renal:   AKI:   - 05/10/20: Scr 2.89 today. Held tacrolimus and started IVF. Patient was instructed to drink plenty of fluids, preferably more than 3L/day, if possible.  - 06/07/20: Cr 1    FEN:   Hypomagnesemia  - Mag Chelate 1 tablet daily.   ??  Hepatic:??  - No active issues.   - VOD prophylaxis with Actigall held since 11/17 due to swelling/rash.      Pulm: DLCO 62%  - Former smoker, quit 01/12/20. Using nicotine patch currently. Has chronic dry cough but PFTs were acceptable.   07/24/19: CT chest w/ 0.6cm RUL nodules, no clear etiology.     ** Discussed with Dr. Oswald Hillock prior to transplant, ok to move forward, no plans to repeat unless new findings.       Neuro/Pain:??  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     Psychosocial:??  - Substance abuse (opioid history). Sober for 5 years.  - Followed by Frederik Schmidt in [facility], placed referral for [facility] while admitted, are not seeing inpatients, will follow- up with him post discharge.   - Insomnia: Continue Trazodone 50mg  nightly prn.     Bone health:  Dexa scan from 10/13 resulted and shows low bone density.   - Seen by pharmacy and discussed zometa and calcium supplemenation 10/22  - Zometa first dose on 03/23/20.  Will need to monitor for recurrence of swelling when we give second dose since it occurred right after 1st dose.    ** He has a dental appointment 12/9 but just for impressions for partial implant (it is a non-invasive dental procedure as the implants are clip on with a retainer, not surgically attached, so ok to proceed with Zometa)   ??  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   - AKI resolved (Creatinine 1.07 06/07/2020)    - Taper tacrolimus to 1mg /0.5mg  (06/07/2020)  - Dr Oswald Hillock feels ok with him returning to work once tacrolimus taper is completed barring any complications during taper.     I personally spent 60 minutes face-to-face and non-face-to-face in the care of this patient, which includes all pre, intra, and post visit time on the date of service.    Pauline Aus  RN, AGNP student  [employer]    I saw and evaluated the patient with the student and reviewed the student's note. I agree with the findings and plan. Faith Elie Confer, PAC.    Faith Elie Confer, [facility]  Physician  Assistant  Adult Bone Marrow Transplantation      Future Appointments   Date Time Provider Department Center   06/13/2020 11:15 AM ONCBMT LABS [facility]   06/13/2020 12:15 PM ONCBMT APP B [facility]   06/20/2020  9:30 AM ONCBMT LABS [facility]   06/20/2020 10:00 AM ONCBMT APP A [facility]   06/27/2020  8:45 AM ONCBMT LABS [facility]   06/27/2020  9:00 AM ONCBMT APP A [facility]   07/04/2020 10:15 AM ONCBMT LABS [facility]   07/04/2020 10:45 AM Artelia Laroche, MD [facility]

## 2020-06-07 NOTE — Unmapped (Signed)
Instructions:   - Taper your tacrolimus to 1mg  in the morning and 0.5mg  in the evening.  We will continue to taper as long as you are feeling good without any symptoms of GVHD.  - I will give you information about Evusheld and if you are interested in getting this, give [place] a call the day before and we will see if we have supply available.  It will be one injection and an hour to observe while you are here.     Return to clinic on 1/25 to see one of the providers.     Covid:   Given ongoing novel coronavirus (COVID-19), it is strongly recommended to avoid travel and crowds.  If you develop fever, cough, shortness of breath and/or have known exposure (close contact < 3 feet) with someone who has tested positive, please notify us immediately.    ??? Your best defense is to stay home as much as possible and use good hand hygiene.    For prescription refills:   For refills, please check your medication bottles to see if you have additional refills left. If so, please call your pharmacy and follow the directions to request a refill. If you do not have any refills left, please make a request during your clinic visit or by submitting a request through MyChart or by calling [phone]. Please allow 24 hours if your request is made during the week or 48 hours if requests are made on the weekends or holidays.     Labs:   Lab Results   Component Value Date    WBC 3.9 (L) 06/07/2020    HGB 8.7 (L) 06/07/2020    HCT 28.7 (L) 06/07/2020    PLT 96 (L) 06/07/2020     Lab Results   Component Value Date    NA 138 06/07/2020    K 4.0 06/07/2020    CL 108 (H) 06/07/2020    CO2 26.0 06/07/2020    BUN 11 06/07/2020    CREATININE 1.07 06/07/2020    GLU 108 06/07/2020    CALCIUM 8.7 06/07/2020    MG 1.6 06/07/2020    PHOS 4.1 02/22/2020     Lab Results   Component Value Date    BILITOT 0.5 06/07/2020    BILIDIR 0.30 02/21/2020    PROT 5.7 06/07/2020    ALBUMIN 3.1 (L) 06/07/2020    ALT 8 (L) 06/07/2020    AST 15 06/07/2020    ALKPHOS 62 06/07/2020    GGT 21 01/26/2020     Lab Results   Component Value Date    INR 1.21 02/22/2020    APTT 33.2 02/17/2020       --------------------------------------------------------------------------------------------------------------------  For appointments & questions Monday through Friday 8 AM-4:30 PM     Please call [phone] or Toll free [phone]    On Nights, Weekends and Holidays  Call [phone] and ask for the oncologist on call    Please visit PrivacyF[employer]cz, a resource created just for family members and caregivers.  This website lists support services, how and where to ask for help. It has tools to assist you as you help [place] care for your loved one.    [facility]  [address]  [place], [place] [address]  [email or web address]

## 2020-06-08 LAB — EBV QUANTITATIVE PCR, BLOOD: EBV VIRAL LOAD RESULT: NOT DETECTED

## 2020-06-08 LAB — TACROLIMUS LEVEL: TACROLIMUS BLOOD: 2.2 ng/mL

## 2020-06-08 NOTE — Unmapped (Signed)
Bone Marrow Transplant and Cellular Therapy Program  Immunosuppressive Therapy Note    [name] is a 60 y.o. 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 [name] is currently day +126.    Current dose: tacrolimus 1 mg BID (decreased 12/22)     Goal tacrolimus Level: 3-5 ng/mL (goal reduced 12/22 for worsening SCr and no signs/symptoms of GVHD at this time)    Resulted level: 2.2 ng/mL (drawn early afternoon and true trough closer to 5)    Lab Results   Component Value Date/Time    TACROLIMUS 2.2 06/07/2020 02:39 PM    TACROLIMUS 3.6 05/31/2020 09:53 AM    TACROLIMUS 2.4 05/18/2020 02:12 PM    TACROLIMUS 2.5 (L) 05/11/2020 10:00 AM    TACROLIMUS 6.5 04/26/2020 09:22 AM    TACROLIMUS 4.3 (L) 04/21/2020 07:52 AM     Lab Results   Component Value Date/Time    CREATININE 1.07 06/07/2020 02:39 PM    CREATININE 1.25 (H) 05/31/2020 09:53 AM    CREATININE 1.33 (H) 05/18/2020 02:12 PM     Recommendation: Tacrolimus level was drawn early afternoon and true trough is likely closer to 5.  SCr continues to improve.   Continue current dose of 1 mg PO BID.   LFTs remain stable/WNL    We will continue to monitor levels. Patient will be followed for changes in renal and hepatic function, toxicity, and efficacy.     Rulon Abide, PharmD, BCOP  Clinical Pharmacist Practitioner, BMT

## 2020-06-09 LAB — CMV DNA, QUANTITATIVE, PCR: CMV VIRAL LD: NOT DETECTED

## 2020-06-13 ENCOUNTER — Encounter: Admit: 2020-06-13 | Discharge: 2020-06-14 | Payer: PRIVATE HEALTH INSURANCE

## 2020-06-13 DIAGNOSIS — Z9484 Stem cells transplant status: Principal | ICD-10-CM

## 2020-06-13 DIAGNOSIS — C9201 Acute myeloblastic leukemia, in remission: Principal | ICD-10-CM

## 2020-06-13 DIAGNOSIS — G5793 Unspecified mononeuropathy of bilateral lower limbs: Principal | ICD-10-CM

## 2020-06-13 DIAGNOSIS — D84822 Immunocompromised state associated with stem cell transplant (CMS-HCC): Principal | ICD-10-CM

## 2020-06-13 LAB — COMPREHENSIVE METABOLIC PANEL
ALBUMIN: 3.4 g/dL (ref 3.4–5.0)
ALKALINE PHOSPHATASE: 61 U/L (ref 46–116)
ALT (SGPT): <7 U/L — ABNORMAL LOW (ref 10–49)
ANION GAP: 5 mmol/L (ref 5–14)
AST (SGOT): 18 U/L (ref <=34)
BILIRUBIN TOTAL: 0.7 mg/dL (ref 0.3–1.2)
BLOOD UREA NITROGEN: 14 mg/dL (ref 9–23)
BUN / CREAT RATIO: 12
CALCIUM: 9.8 mg/dL (ref 8.7–10.4)
CHLORIDE: 108 mmol/L — ABNORMAL HIGH (ref 98–107)
CO2: 26 mmol/L (ref 20.0–31.0)
CREATININE: 1.18 mg/dL — ABNORMAL HIGH
EGFR CKD-EPI AA MALE: 79 mL/min/{1.73_m2} (ref >=60)
EGFR CKD-EPI NON-AA MALE: 68 mL/min/{1.73_m2} (ref >=60)
GLUCOSE RANDOM: 113 mg/dL (ref 70–179)
POTASSIUM: 4.3 mmol/L (ref 3.4–4.5)
PROTEIN TOTAL: 5.9 g/dL (ref 5.7–8.2)
SODIUM: 139 mmol/L (ref 135–145)

## 2020-06-13 LAB — CBC W/ AUTO DIFF
BASOPHILS ABSOLUTE COUNT: 0 10*9/L (ref 0.0–0.1)
BASOPHILS RELATIVE PERCENT: 0.3 %
EOSINOPHILS ABSOLUTE COUNT: 0 10*9/L (ref 0.0–0.4)
EOSINOPHILS RELATIVE PERCENT: 0.8 %
HEMATOCRIT: 31 % — ABNORMAL LOW (ref 41.0–53.0)
HEMOGLOBIN: 9.6 g/dL — ABNORMAL LOW (ref 13.5–17.5)
LARGE UNSTAINED CELLS: 2 % (ref 0–4)
LYMPHOCYTES ABSOLUTE COUNT: 0.5 10*9/L — ABNORMAL LOW (ref 1.5–5.0)
LYMPHOCYTES RELATIVE PERCENT: 9.2 %
MEAN CORPUSCULAR HEMOGLOBIN CONC: 31 g/dL (ref 31.0–37.0)
MEAN CORPUSCULAR HEMOGLOBIN: 35.1 pg — ABNORMAL HIGH (ref 26.0–34.0)
MEAN CORPUSCULAR VOLUME: 113.2 fL — ABNORMAL HIGH (ref 80.0–100.0)
MEAN PLATELET VOLUME: 11.2 fL — ABNORMAL HIGH (ref 7.0–10.0)
MONOCYTES ABSOLUTE COUNT: 0.3 10*9/L (ref 0.2–0.8)
MONOCYTES RELATIVE PERCENT: 5.6 %
NEUTROPHILS ABSOLUTE COUNT: 4.6 10*9/L (ref 2.0–7.5)
NEUTROPHILS RELATIVE PERCENT: 82.1 %
PLATELET COUNT: 100 10*9/L — ABNORMAL LOW (ref 150–440)
RED BLOOD CELL COUNT: 2.74 10*12/L — ABNORMAL LOW (ref 4.50–5.90)
RED CELL DISTRIBUTION WIDTH: 17.9 % — ABNORMAL HIGH (ref 12.0–15.0)
WBC ADJUSTED: 5.5 10*9/L (ref 4.5–11.0)

## 2020-06-13 LAB — MAGNESIUM: MAGNESIUM: 1.8 mg/dL (ref 1.6–2.6)

## 2020-06-13 LAB — TACROLIMUS LEVEL: TACROLIMUS BLOOD: 1.3 ng/mL

## 2020-06-13 LAB — SLIDE REVIEW

## 2020-06-13 MED ORDER — TACROLIMUS 0.5 MG CAPSULE, IMMEDIATE-RELEASE
ORAL_CAPSULE | Freq: Two times a day (BID) | ORAL | 5 refills | 30.00000 days | Status: CP
Start: 2020-06-13 — End: 2020-06-13
  Filled 2020-06-14: qty 120, 30d supply, fill #0

## 2020-06-13 MED ORDER — PREGABALIN 75 MG CAPSULE
ORAL_CAPSULE | ORAL | 0 refills | 30.00000 days | Status: CN
Start: 2020-06-13 — End: ?
  Filled 2020-06-19: qty 90, 30d supply, fill #0

## 2020-06-13 MED ORDER — TRAZODONE 50 MG TABLET
ORAL_TABLET | Freq: Every evening | ORAL | 3 refills | 90 days | Status: CP | PRN
Start: 2020-06-13 — End: ?

## 2020-06-13 MED ADMIN — heparin, porcine (PF) 100 unit/mL injection 200 Units: 200 [IU] | INTRAVENOUS | @ 16:00:00 | Stop: 2020-06-13

## 2020-06-13 NOTE — Unmapped (Signed)
John Erickson 's tacrolimus shipment will be delayed due to No refills We have contacted the patient and communicated the delivery change to patient/caregiver We will reschedule the medication for the delivery date that the patient agreed upon. We have confirmed the delivery date as 01/26 .

## 2020-06-13 NOTE — Unmapped (Addendum)
Stop magnesium.    Next Wednesday Feb 2nd we will decrease the tacrolimus again.      Labs are great today!       Recent Results (from the past 24 hour(s))   Comprehensive Metabolic Panel    Collection Time: 06/13/20 11:08 AM   Result Value Ref Range    Sodium 139 135 - 145 mmol/L    Potassium 4.3 3.4 - 4.5 mmol/L    Chloride 108 (H) 98 - 107 mmol/L    Anion Gap 5 5 - 14 mmol/L    CO2 26.0 20.0 - 31.0 mmol/L    BUN 14 9 - 23 mg/dL    Creatinine 1.61 (H) 0.60 - 1.10 mg/dL    BUN/Creatinine Ratio 12     EGFR CKD-EPI Non-African American, Male 66 >=60 mL/min/1.57m2    EGFR CKD-EPI African American, Male 64 >=60 mL/min/1.28m2    Glucose 113 70 - 179 mg/dL    Calcium 9.8 8.7 - 09.6 mg/dL    Albumin 3.4 3.4 - 5.0 g/dL    Total Protein 5.9 5.7 - 8.2 g/dL    Total Bilirubin 0.7 0.3 - 1.2 mg/dL    AST 18 <=04 U/L    ALT <7 (L) 10 - 49 U/L    Alkaline Phosphatase 61 46 - 116 U/L   Magnesium Level    Collection Time: 06/13/20 11:08 AM   Result Value Ref Range    Magnesium 1.8 1.6 - 2.6 mg/dL   CBC w/ Differential    Collection Time: 06/13/20 11:08 AM   Result Value Ref Range    WBC 5.5 4.5 - 11.0 10*9/L    RBC 2.74 (L) 4.50 - 5.90 10*12/L    HGB 9.6 (L) 13.5 - 17.5 g/dL    HCT 54.0 (L) 98.1 - 53.0 %    MCV 113.2 (H) 80.0 - 100.0 fL    MCH 35.1 (H) 26.0 - 34.0 pg    MCHC 31.0 31.0 - 37.0 g/dL    RDW 19.1 (H) 47.8 - 15.0 %    MPV 11.2 (H) 7.0 - 10.0 fL    Platelet 100 (L) 150 - 440 10*9/L    Neutrophils % 82.1 %    Lymphocytes % 9.2 %    Monocytes % 5.6 %    Eosinophils % 0.8 %    Basophils % 0.3 %    Absolute Neutrophils 4.6 2.0 - 7.5 10*9/L    Absolute Lymphocytes 0.5 (L) 1.5 - 5.0 10*9/L    Absolute Monocytes 0.3 0.2 - 0.8 10*9/L    Absolute Eosinophils 0.0 0.0 - 0.4 10*9/L    Absolute Basophils 0.0 0.0 - 0.1 10*9/L    Large Unstained Cells 2 0 - 4 %    Macrocytosis Marked (A) Not Present    Anisocytosis Slight (A) Not Present    Hypochromasia Marked (A) Not Present

## 2020-06-13 NOTE — Unmapped (Signed)
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  Transplant was tolerated well, complicated by mucositis requiring PCA.    Interval History:     [name] presents to clinic for follow up visit. He is doing well overall. He states that he started to be a bit more independent with the goal of being able to go back to work in the next few months.  He and his fiance are going to the gym a at least 3 x per week.  He is lifting weights. He is not doing much cardio at the gym.  They are planning a wedding and with that they have taken on dance lessons.  He is learning the [place] [employer], civil (consulting).       He has tapered tacrolimus as directed and has no GVHD sx at this time.  He has been eating pretty well. He has lost some weight but he has increased his activity quite a bit.  He has no NVD.      Discussed Evusheld and provided pt info last visit, pt wanted to think about it.  Has not been vaccinated.    ROS:  A comprehensive ROS performed and is negative except for pertinent positives as listed above in interval history.     I reviewed and updated past medical, surgical, social, and family history as appropriate.     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,XY[1]    Variants of Known or Likely Clinical Significance  Gene Transcript  Predicted Protein  VAF (%)   CEBPA c.753_762del p.Ser251Argfs 41      Variants of Unknown Significance  Gene Transcript Predicted Protein VAF (%)   TET2 c.4946A>G p.Tyr1649Cys 49      -  FLT-3-ITD and FLT-3-TKD studies are negative    Pertinent Phenotypic data:    Disease-specific prognostic estimate:     Induction:   7+3+HD Dauno (C1D1 2/24)    Recovery Marrow:    Final Diagnosis   Date Value Ref Range Status   08/12/2019   Final    Bone marrow, right iliac, aspiration and biopsy  -  Normocellular bone marrow (60%) with trilineage hematopoiesis and 4% blasts by manual aspirate differential  -  Routine cytogenetic analysis is pending  -  Flow cytometric MRD analysis is pending    This electronic signature is attestation that the pathologist personally reviewed the submitted material(s) and the final diagnosis reflects that evaluation.            Genetics:   Karyotype/FISH:   RESULTS   Date Value Ref Range Status   07/09/2019   Final    Abnormal Karyotype: 46,Y,t(X;8)(q26;q11.2)[19]/46,XY[1]    Normal FISH:  An interphase FISH assay shows no evidence of a rearrangement involving the  KMT2A (MLL) gene region in the 200 nuclei scored (see below).           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,XY[1]  Variants of Known or Likely Clinical Significance  Gene Transcript  Predicted Protein  VAF (%)   CEBPA c.753_762del p.Ser251Argfs 41      Variants of Unknown Significance  Gene Transcript Predicted Protein VAF (%)   TET2 c.4946A>G p.Tyr1649Cys 49      -  FLT-3-ITD and FLT-3-TKD studies are negative     07/09/2019 Initial Diagnosis    AML (acute myeloid leukemia) (CMS-HCC)     07/14/2019 - 07/20/2019 Chemotherapy    IP LEUKEMIA 7+3 HIGH DOSE DAUNOrubicin  DAUNOrubicin 90 mg/m2 IV on Days 1, 2, 3  Cytarabine 100 mg/m2 CIVI on Days 1 to 7     07/27/2019 Biopsy    Bone marrow, right iliac, aspiration and biopsy  -  Hypocellular bone marrow (<5%) with treatment effect, markedly reduced trilineage hematopoiesis, and 3% blasts by manual aspirate differential (see Comment).     08/24/2019 - 09/28/2019 Chemotherapy    IP LEUKEMIA HIGH DOSE CYTARABINE CONSOLIDATION (3 G/M2)  cytarabine 3 g/m2 every 12 hours on days 1, 3 and 5 every 28 days     08/24/2019 - 12/03/2019 Chemotherapy    IP LEUKEMIA HIGH DOSE CYTARABINE CONSOLIDATION (3 G/M2) ON DAYS 1,2,3  cytarabine 3 g/m2 IV every 12 hours on days 1, 2, 3, every 28 days     01/19/2020 - 01/19/2020 Chemotherapy    BMT OP BUSULFAN TEST DOSE  Busulfan 0.8 mg/kg IV ONCE     01/26/2020 -  Chemotherapy    BMT IP MAC BUSULFAN / FLUDARABINE / rATG (MUD)   Busulfan IV Days -6 to -3  Fludarabine 40 mg/m2 IV Days -6 to -3  rATG 0.5 mg/kg IV Day -3, 1.5 mg/kg IV Day -2, 2.5 mg/kg IV Day -1       Physical exam:       BP 136/79  - Pulse 84  - Temp 36.8 ??C (98.2 ??F) (Oral)  - Wt 70.6 kg (155 lb 9.6 oz)  - SpO2 92%  - BMI 24.36 kg/m??   80, Normal activity with effort; some signs or symptoms of disease (ECOG equivalent 1)    General: No acute distress noted.   Central Venous Access: Port accesssed.   ENT: Moist mucous membranes. Oropharhynx without lesions, erythema or exudate.   Cardiovascular: Pulse normal rate, regularity and rhythm. S1 and S2 normal, without any murmur, rub, or gallop.  Lungs: Clear to auscultation bilaterally, without wheezes/crackles/rhonchi. Good air movement.   Skin: No new lesions. No rash or abrasions. Bronzy skin color.   Psychiatry: Alert and oriented to person, place, and time.   Gastrointestinal/Abdomen: Normoactive bowel sounds, abdomen soft, non-tender   Musculoskeletal/Extremities: FROM throughout. +1.5 BLE edema ankles/shins.     Neurologic: CNII-XII intact. Normal strength and sensation throughout    Labs:  I reviewed all labs from today in Epic. See EMR for lab results.    Lab Results   Component Value Date    WBC 5.5 06/13/2020    HGB 9.6 (L) 06/13/2020    HCT 31.0 (L) 06/13/2020    PLT 100 (  L) 06/13/2020     Lab Results   Component Value Date    NA 139 06/13/2020    K 4.3 06/13/2020    CL 108 (H) 06/13/2020    CO2 26.0 06/13/2020    BUN 14 06/13/2020    CREATININE 1.18 (H) 06/13/2020    GLU 113 06/13/2020    CALCIUM 9.8 06/13/2020    MG 1.8 06/13/2020    PHOS 4.1 02/22/2020     Lab Results   Component Value Date    BILITOT 0.7 06/13/2020    BILIDIR 0.30 02/21/2020    PROT 5.9 06/13/2020    ALBUMIN 3.4 06/13/2020    ALT <7 (L) 06/13/2020    AST 18 06/13/2020    ALKPHOS 61 06/13/2020    GGT 21 01/26/2020     Lab Results   Component Value Date    PT 14.1 (H) 02/22/2020    INR 1.21 02/22/2020    APTT 33.2 02/17/2020     Assessment and Plan    BMT:??AML, CR MRD Negative  HCT-CI (age adjusted) 7??(psych, severe pulmonary dysfunction, and age)   Conditioning:??MAC Bu/Flu/ATG  Donor:??9/10, ABO O+, CMV+  Engraftment:??Date of last granix injection: 02/19/20    Chimerism:   - 04/07/20: 94% donor in CD3+ and >95% donor in UF.  Has Day 21 BmBx was scheduled for 12/8, but now moved to 12/15 since he was later arriving today.    - 05/03/20: Bone marrow biopsy confirms ongoing CR1 with MRD-. 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     GVHD prophylaxis:??  1.Tacrolimus being managed by pharmacy with a goal of 5-10 ng/mL.   05/10/20: The dose has been increased to aim for more therapeutic level - in face of persistent fluid retention and persistent GI symptoms.   Today tacrolimus level is pending. Cr is 2.89, which is new. We wonder if increase in tacrolimus level and his relatively poor oral intake is not responsible fot this. Will hold tacrolimus pending level.     05/31/20: Plan to taper tacrolimus by 0.5 mg every 2 weeks:  1/19: 1 mg/0.5 mg  2/2:  0.5mg /0.5mg   2/16: 0.5 mg qPM  3/1: 0.5mg  every other PM  3/15: Stop    2. Methotrexate??15 mg/m2 IVP on day +1 then 10 mg/m2 on days +3, +6 and +11  3. ATG per [facility] standard dosing was included  ??  Heme:??  Transfusion criteria:??1 unit of PRBCs for Hgb<7 and 1 unit of platelets for Plt <10K or bleeding.   -??No history of transfusion reactions.   - Plts decreased, but now slowly improving.  Possibly related to whichever medication caused edema or slow engraftment.     ID:??  Prophylaxis:  - Antiviral: Valtrex 500mg  daily   - Antifungal: None  - PJP: Begin Dapsone 100mg  PO daily (has Sulfa allergy) on 12/1. He has supply but still needs to start taking this.   - CMV D+/R+: Restarted Letermovir prophy on 12/1, 480mg  po daily, Continue through D+100. Tressia Miners has been stopped.     C.Diff:   - Mild symptoms, positive on 12/1  - Oral Vancomycin (12/2-12/11) completed 10 day course  - Repeat C.diff 12/15 was negative    Vesicular lesions:  - Initial AC fossa lesion 10/25, lesions were migratory and vesicular in a linear fashion  - Swab and blood both negative for VZV or HSV.  Trial of treatment dose Valtex did not change rash.    - As of 11/16 lesions resolved and no further  rash.   ??  Allergy:  - PCN allergy (hives) has tolerated Cefepime without issues    Seasonal Allergies:  - Flonase and Claritin daily prn.   ??  EBV:  - IgM Ab + 01/05/20 but viral load 01/05/20 not detected.   - Viral Load 12/8, not detected    *Full virals sent 11/9 given mild thrombocytopenia and rash this panel was negative.  ??  Renal:   AKI:   - 05/10/20: Scr 2.89 today. Held tacrolimus and started IVF. Patient was instructed to drink plenty of fluids, preferably more than 3L/day, if possible.  - 06/13/20: Cr 1.1    FEN:   Hypomagnesemia:  Stop supplement.   ??  Hepatic:??  - No active issues.   - VOD prophylaxis with Actigall held since 11/17 due to swelling/rash.      Pulm: DLCO 62%  - Former smoker, quit 01/12/20. Using nicotine patch currently. Has chronic dry cough but PFTs were acceptable.   07/24/19: CT chest w/ 0.6cm RUL nodules, no clear etiology.     ** Discussed with Dr. Oswald Hillock prior to transplant, ok to move forward, no plans to repeat unless new findings.       Neuro/Pain:??  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     Psychosocial:??  - Substance abuse (opioid history). Sober for 5 years.  - Followed by Frederik Schmidt in [facility], placed referral for [facility] while admitted, are not seeing inpatients, will follow- up with him post discharge.   - Insomnia: Continue Trazodone 50mg  nightly prn.     Bone health:  Dexa scan from 10/13 resulted and shows low bone density.   - Seen by pharmacy and discussed zometa and calcium supplemenation 10/22  - Zometa first dose on 03/23/20.  Will need to monitor for recurrence of swelling when we give second dose since it occurred right after 1st dose.    ** He has a dental appointment 12/9 but just for impressions for partial implant (it is a non-invasive dental procedure as the implants are clip on with a retainer, not surgically attached, so ok to proceed with Zometa)   ??  Summary:   - RTC weekly until completed tac taper  - Day 90 BMBx indicates ongoing CR1 MRD-. >95% donor chimera.   - AKI resolved (Creatinine 1.07 06/07/2020)    - Continue with tacrolimus to 1mg /0.5mg  (06/07/2020)  - Dr Oswald Hillock feels ok with him returning to work once tacrolimus taper is completed barring any complications during taper.     I personally spent 40 minutes face-to-face and non-face-to-face in the care of this patient, which includes all pre, intra, and post visit time on the date of service.        Westley Hummer ANP-BC, AOCNP  [facility]      Future Appointments   Date Time Provider Department Center   06/20/2020  9:30 AM [facility]CBMT LABS [facility]   06/20/2020 10:00 AM ONCBMT APP A [facility]   06/27/2020  8:45 AM [facility]MT LABS [facility]   06/27/2020  9:00 AM ONCBMT APP A [facility]   07/04/2020 10:15 AM [facility]CBMT LABS [facility]   07/04/2020 10:45 AM Artelia Laroche, MD [facility]

## 2020-06-14 LAB — EBV QUANTITATIVE PCR, BLOOD: EBV VIRAL LOAD RESULT: NOT DETECTED

## 2020-06-15 LAB — CMV DNA, QUANTITATIVE, PCR: CMV VIRAL LD: NOT DETECTED

## 2020-06-20 ENCOUNTER — Encounter: Admit: 2020-06-20 | Discharge: 2020-06-21 | Payer: PRIVATE HEALTH INSURANCE

## 2020-06-20 DIAGNOSIS — Z79899 Other long term (current) drug therapy: Principal | ICD-10-CM

## 2020-06-20 DIAGNOSIS — R053 Chronic cough: Principal | ICD-10-CM

## 2020-06-20 DIAGNOSIS — C9201 Acute myeloblastic leukemia, in remission: Principal | ICD-10-CM

## 2020-06-20 DIAGNOSIS — J302 Other seasonal allergic rhinitis: Principal | ICD-10-CM

## 2020-06-20 DIAGNOSIS — Z85828 Personal history of other malignant neoplasm of skin: Principal | ICD-10-CM

## 2020-06-20 DIAGNOSIS — M62571 Muscle wasting and atrophy, not elsewhere classified, right ankle and foot: Principal | ICD-10-CM

## 2020-06-20 DIAGNOSIS — Z9221 Personal history of antineoplastic chemotherapy: Principal | ICD-10-CM

## 2020-06-20 DIAGNOSIS — F1111 Opioid abuse, in remission: Principal | ICD-10-CM

## 2020-06-20 DIAGNOSIS — Z880 Allergy status to penicillin: Principal | ICD-10-CM

## 2020-06-20 DIAGNOSIS — R110 Nausea: Principal | ICD-10-CM

## 2020-06-20 DIAGNOSIS — Z9484 Stem cells transplant status: Principal | ICD-10-CM

## 2020-06-20 DIAGNOSIS — R918 Other nonspecific abnormal finding of lung field: Principal | ICD-10-CM

## 2020-06-20 DIAGNOSIS — G629 Polyneuropathy, unspecified: Principal | ICD-10-CM

## 2020-06-20 DIAGNOSIS — Z87891 Personal history of nicotine dependence: Principal | ICD-10-CM

## 2020-06-20 DIAGNOSIS — M8580 Other specified disorders of bone density and structure, unspecified site: Principal | ICD-10-CM

## 2020-06-20 DIAGNOSIS — D84822 Immunocompromised state associated with stem cell transplant (CMS-HCC): Principal | ICD-10-CM

## 2020-06-20 DIAGNOSIS — M62572 Muscle wasting and atrophy, not elsewhere classified, left ankle and foot: Principal | ICD-10-CM

## 2020-06-20 DIAGNOSIS — Z882 Allergy status to sulfonamides status: Principal | ICD-10-CM

## 2020-06-20 LAB — CBC W/ AUTO DIFF
BASOPHILS ABSOLUTE COUNT: 0 10*9/L (ref 0.0–0.1)
BASOPHILS RELATIVE PERCENT: 0.1 %
EOSINOPHILS ABSOLUTE COUNT: 0 10*9/L (ref 0.0–0.4)
EOSINOPHILS RELATIVE PERCENT: 0.9 %
HEMATOCRIT: 29.9 % — ABNORMAL LOW (ref 41.0–53.0)
HEMOGLOBIN: 9.4 g/dL — ABNORMAL LOW (ref 13.5–17.5)
LARGE UNSTAINED CELLS: 2 % (ref 0–4)
LYMPHOCYTES ABSOLUTE COUNT: 0.7 10*9/L — ABNORMAL LOW (ref 1.5–5.0)
LYMPHOCYTES RELATIVE PERCENT: 15 %
MEAN CORPUSCULAR HEMOGLOBIN CONC: 31.4 g/dL (ref 31.0–37.0)
MEAN CORPUSCULAR HEMOGLOBIN: 35.8 pg — ABNORMAL HIGH (ref 26.0–34.0)
MEAN CORPUSCULAR VOLUME: 114.2 fL — ABNORMAL HIGH (ref 80.0–100.0)
MEAN PLATELET VOLUME: 11.4 fL — ABNORMAL HIGH (ref 7.0–10.0)
MONOCYTES ABSOLUTE COUNT: 0.3 10*9/L (ref 0.2–0.8)
MONOCYTES RELATIVE PERCENT: 5.7 %
NEUTROPHILS ABSOLUTE COUNT: 3.5 10*9/L (ref 2.0–7.5)
NEUTROPHILS RELATIVE PERCENT: 76.3 %
PLATELET COUNT: 96 10*9/L — ABNORMAL LOW (ref 150–440)
RED BLOOD CELL COUNT: 2.62 10*12/L — ABNORMAL LOW (ref 4.50–5.90)
RED CELL DISTRIBUTION WIDTH: 17.4 % — ABNORMAL HIGH (ref 12.0–15.0)
WBC ADJUSTED: 4.6 10*9/L (ref 4.5–11.0)

## 2020-06-20 LAB — CMV DNA, QUANTITATIVE, PCR: CMV VIRAL LD: NOT DETECTED

## 2020-06-20 LAB — TACROLIMUS LEVEL: TACROLIMUS BLOOD: 1.3 ng/mL

## 2020-06-20 LAB — COMPREHENSIVE METABOLIC PANEL
ALBUMIN: 3.4 g/dL (ref 3.4–5.0)
ALKALINE PHOSPHATASE: 60 U/L (ref 46–116)
ALT (SGPT): 8 U/L — ABNORMAL LOW (ref 10–49)
ANION GAP: 5 mmol/L (ref 5–14)
AST (SGOT): 18 U/L (ref <=34)
BILIRUBIN TOTAL: 0.6 mg/dL (ref 0.3–1.2)
BLOOD UREA NITROGEN: 16 mg/dL (ref 9–23)
BUN / CREAT RATIO: 13
CALCIUM: 9.5 mg/dL (ref 8.7–10.4)
CHLORIDE: 107 mmol/L (ref 98–107)
CO2: 26 mmol/L (ref 20.0–31.0)
CREATININE: 1.19 mg/dL — ABNORMAL HIGH
EGFR CKD-EPI AA MALE: 78 mL/min/{1.73_m2} (ref >=60)
EGFR CKD-EPI NON-AA MALE: 67 mL/min/{1.73_m2} (ref >=60)
GLUCOSE RANDOM: 176 mg/dL (ref 70–179)
POTASSIUM: 3.6 mmol/L (ref 3.4–4.5)
PROTEIN TOTAL: 5.8 g/dL (ref 5.7–8.2)
SODIUM: 138 mmol/L (ref 135–145)

## 2020-06-20 LAB — MAGNESIUM: MAGNESIUM: 1.4 mg/dL — ABNORMAL LOW (ref 1.6–2.6)

## 2020-06-20 MED ORDER — PROMETHAZINE 25 MG TABLET
ORAL_TABLET | Freq: Four times a day (QID) | ORAL | 1 refills | 12.00000 days | Status: CP | PRN
Start: 2020-06-20 — End: 2020-07-20

## 2020-06-20 MED ORDER — TACROLIMUS 0.5 MG CAPSULE, IMMEDIATE-RELEASE
ORAL_CAPSULE | Freq: Two times a day (BID) | ORAL | 5 refills | 60 days | Status: CP
Start: 2020-06-20 — End: 2020-07-05

## 2020-06-20 MED ADMIN — sodium chloride (NS) 0.9 % infusion: 20 mL/h | INTRAVENOUS | @ 16:00:00 | Stop: 2020-06-20

## 2020-06-20 MED ADMIN — zoledronic acid (ZOMETA) 4 mg in sodium chloride (NS) 0.9 % 100 mL IVPB: 4 mg | INTRAVENOUS | @ 16:00:00 | Stop: 2020-06-20

## 2020-06-20 NOTE — Unmapped (Signed)
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  Transplant was tolerated well, complicated by mucositis requiring PCA.    Interval History:   John Erickson is here today for routine follow up.  He states that he has been feeling well. He has lost more weight. He has been trying to eat more.  He has 5 small meals.  Breakfast is yogurt, lunch is soup or left overs, at dinner he has protein and veggies.  At night he will eat a dessert.  Doing about 900 in calories burning much more.  He has nausea.  He takes phenergan about 1 x per day.  He had bad nausea yesterday and took 3 pills.   He has normal bowels. Skis is good. His partner instated a skin moisture regimen which has worked well.     Discussed Evusheld again today and he wants to give it another 3 weeks or so before he decides.  He has not been vaccinated.    ROS:  A comprehensive ROS performed and is negative except for pertinent positives as listed above in interval history.     I reviewed and updated past medical, surgical, social, and family history as appropriate.     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,XY[1]    Variants of Known or Likely Clinical Significance  Gene Transcript  Predicted Protein  VAF (%)   CEBPA c.[id number] p.Ser251Argfs 41      Variants of Unknown Significance  Gene Transcript Predicted Protein VAF (%)   TET2 c.4946A>G p.Tyr1649Cys 49      -  FLT-3-ITD and FLT-3-TKD studies are negative    Pertinent Phenotypic data:    Disease-specific prognostic estimate:     Induction:   7+3+HD Dauno (C1D1 2/24)    Recovery Marrow:    Final Diagnosis   Date Value Ref Range Status   08/12/2019   Final    Bone marrow, right iliac, aspiration and biopsy  -  Normocellular bone marrow (60%) with trilineage hematopoiesis and 4% blasts by manual aspirate differential  -  Routine cytogenetic analysis is pending  -  Flow cytometric MRD analysis is pending    This electronic signature is attestation that the pathologist personally reviewed the submitted material(s) and the final diagnosis reflects that evaluation.            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;q11.2)[19]/46,XY[1]  Variants of Known or Likely Clinical Significance  Gene Transcript  Predicted Protein  VAF (%)   CEBPA c.753_762del p.Ser251Argfs 41      Variants of Unknown Significance  Gene Transcript Predicted Protein VAF (%)   TET2 c.4946A>G p.Tyr1649Cys 49      -  FLT-3-ITD and FLT-3-TKD studies are negative     07/09/2019 Initial Diagnosis    AML (acute myeloid leukemia) (CMS-HCC)     07/14/2019 - 07/20/2019 Chemotherapy    IP LEUKEMIA 7+3 HIGH DOSE DAUNOrubicin  DAUNOrubicin 90 mg/m2 IV on Days 1, 2, 3  Cytarabine 100 mg/m2 CIVI on Days 1 to 7     07/27/2019 Biopsy Bone marrow, right iliac, aspiration and biopsy  -  Hypocellular bone marrow (<5%) with treatment effect, markedly reduced trilineage hematopoiesis, and 3% blasts by manual aspirate differential (see Comment).     08/24/2019 - 09/28/2019 Chemotherapy    IP LEUKEMIA HIGH DOSE CYTARABINE CONSOLIDATION (3 G/M2)  cytarabine 3 g/m2 every 12 hours on days 1, 3 and 5 every 28 days     08/24/2019 - 12/03/2019 Chemotherapy    IP LEUKEMIA HIGH DOSE CYTARABINE CONSOLIDATION (3 G/M2) ON DAYS 1,2,3  cytarabine 3 g/m2 IV every 12 hours on days 1, 2, 3, every 28 days     01/19/2020 - 01/19/2020 Chemotherapy    BMT OP BUSULFAN TEST DOSE  Busulfan 0.8 mg/kg IV ONCE     01/26/2020 -  Chemotherapy    BMT IP MAC BUSULFAN / FLUDARABINE / rATG (MUD)   Busulfan IV Days -6 to -3  Fludarabine 40 mg/m2 IV Days -6 to -3  rATG 0.5 mg/kg IV Day -3, 1.5 mg/kg IV Day -2, 2.5 mg/kg IV Day -1       Physical exam:  Temp:  [36.8 ??C (98.2 ??F)] 36.8 ??C (98.2 ??F)  Heart Rate:  [102] 102  BP: (141)/(80) 141/80 Temp:  [36.8 ??C (98.2 ??F)] 36.8 ??C (98.2 ??F)  Heart Rate:  [102] 102  BP: (141)/(80) 141/80  BP 141/80  - Pulse 102  - Temp 36.8 ??C (98.2 ??F) (Oral)  - Resp 18  - Ht 170.2 cm (5' 7)  - Wt 69.3 kg (152 lb 11.2 oz)  - SpO2 91%  - BMI 23.92 kg/m??   80, Normal activity with effort; some signs or symptoms of disease (ECOG equivalent 1)    General: No acute distress noted. Resolving alopecia.   Central Venous Access: Port accesssed.   ENT: Moist mucous membranes. Oropharhynx without lesions, erythema or exudate.   Cardiovascular: Pulse normal rate, regularity and rhythm. S1 and S2 normal, without any murmur, rub, or gallop.  Lungs: Clear to auscultation bilaterally, without wheezes/crackles/rhonchi. Good air movement.   Skin: No new lesions. No rash or abrasions. Bronzy skin color.   Psychiatry: Alert and oriented to person, place, and time.   Gastrointestinal/Abdomen: Normoactive bowel sounds, abdomen soft, non-tender   Musculoskeletal/Extremities: FROM throughout. +1.5 BLE edema ankles/shins.     Neurologic: CNII-XII intact. Normal strength and sensation throughout    Labs:  I reviewed all labs from today in Epic. See EMR for lab results.    Lab Results   Component Value Date    WBC 4.6 06/20/2020    HGB 9.4 (L) 06/20/2020  HCT 29.9 (L) 06/20/2020    PLT 96 (L) 06/20/2020     Lab Results   Component Value Date    NA 138 06/20/2020    K 3.6 06/20/2020    CL 107 06/20/2020    CO2 26.0 06/20/2020    BUN 16 06/20/2020    CREATININE 1.19 (H) 06/20/2020    GLU 176 06/20/2020    CALCIUM 9.5 06/20/2020    MG 1.4 (L) 06/20/2020    PHOS 4.1 02/22/2020     Lab Results   Component Value Date    BILITOT 0.6 06/20/2020    BILIDIR 0.30 02/21/2020    PROT 5.8 06/20/2020    ALBUMIN 3.4 06/20/2020    ALT 8 (L) 06/20/2020    AST 18 06/20/2020    ALKPHOS 60 06/20/2020    GGT 21 01/26/2020     Lab Results   Component Value Date    PT 14.1 (H) 02/22/2020    INR 1.21 02/22/2020    APTT 33.2 02/17/2020     Assessment and Plan    BMT:??AML, CR MRD Negative  HCT-CI (age adjusted) 5??(psych, severe pulmonary dysfunction, and age)   Conditioning:??MAC Bu/Flu/ATG  Donor:??9/10, ABO O+, CMV+  Engraftment:??Date of last granix injection: 02/19/20    Chimerism:   - 04/07/20: 94% donor in CD3+ and >95% donor in UF.  Has Day 1 BmBx was scheduled for 12/8, but now moved to 12/15 since he was later arriving today.    - 05/03/20: Bone marrow biopsy confirms ongoing CR1 with MRD-. 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     GVHD prophylaxis:??  1.Tacrolimus being managed by pharmacy with a goal of 5-10 ng/mL.   05/10/20: The dose has been increased to aim for more therapeutic level - in face of persistent fluid retention and persistent GI symptoms.   Today tacrolimus level is pending. Cr is 2.89, which is new. We wonder if increase in tacrolimus level and his relatively poor oral intake is not responsible fot this. Will hold tacrolimus pending level.     05/31/20: Plan to taper tacrolimus by 0.5 mg every 2 weeks:  1/19: 1 mg/0.5 mg  2/2:  0.5mg /0.5mg    2/16: 0.5 mg qPM  3/1: 0.5mg  every other PM  3/15: Stop    2. Methotrexate??15 mg/m2 IVP on day +1 then 10 mg/m2 on days +3, +6 and +11  3. ATG per [facility] standard dosing was included  ??  Heme:??  Transfusion criteria:??1 unit of PRBCs for Hgb<7 and 1 unit of platelets for Plt <10K or bleeding.   -??No history of transfusion reactions.   - Plts decreased, but now slowly improving.  Possibly related to whichever medication caused edema or slow engraftment.     ID:??  Prophylaxis:  - Antiviral: Valtrex 500mg  daily   - Antifungal: None  - PJP: Begin Dapsone 100mg  PO daily (has Sulfa allergy) on 12/1. He has supply but still needs to start taking this.   - CMV D+/R+: Restarted Letermovir prophy on 12/1, 480mg  po daily, Continue through D+100. Tressia Miners has been stopped.     C.Diff:   - Mild symptoms, positive on 12/1  - Oral Vancomycin (12/2-12/11) completed 10 day course  - Repeat C.diff 12/15 was negative    Vesicular lesions:  - Initial AC fossa lesion 10/25, lesions were migratory and vesicular in a linear fashion  - Swab and blood both negative for VZV or HSV.  Trial of treatment dose Valtex did not change rash.    -  As of 11/16 lesions resolved and no further rash.   ??  Allergy:  - PCN allergy (hives) has tolerated Cefepime without issues    Seasonal Allergies:  - Flonase and Claritin daily prn.   ??  EBV:  - IgM Ab + 01/05/20 but viral load 01/05/20 not detected.   - Viral Load 12/8, not detected    *Full virals sent 11/9 given mild thrombocytopenia and rash this panel was negative.  ??  Renal:   AKI:   - 05/10/20: Scr 2.89 today. Held tacrolimus and started IVF. Patient was instructed to drink plenty of fluids, preferably more than 3L/day, if possible.  - 06/20/20: Cr 1.1    FEN:   Hypomagnesemia:  Stopped supplement last week.  Mag 1.4  We will Futcher taper tac today.   ??  Hepatic:??  - No active issues.   - VOD prophylaxis with Actigall held since 11/17 due to swelling/rash. Pulm: DLCO 62%  - Former smoker, quit 01/12/20. Using nicotine patch currently. Has chronic dry cough but PFTs were acceptable.   07/24/19: CT chest w/ 0.6cm RUL nodules, no clear etiology.     ** Discussed with Dr. Oswald Hillock prior to transplant, ok to move forward, no plans to repeat unless new findings.       Neuro/Pain:??  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     Psychosocial:??  - Substance abuse (opioid history). Sober for 5 years.  - Followed by Frederik Schmidt in [facility], placed referral for [facility] while admitted, are not seeing inpatients, will follow- up with him post discharge.   - Insomnia: Continue Trazodone 50mg  nightly prn.     Bone health:  Dexa scan from 10/13 resulted and shows low bone density.   - Seen by pharmacy and discussed zometa and calcium supplemenation 10/22  - Zometa first dose on 03/23/20.  Will need to monitor for recurrence of swelling when we give second dose since it occurred right after 1st dose.    ** He has a dental appointment 12/9 but just for impressions for partial implant (it is a non-invasive dental procedure as the implants are clip on with a retainer, not surgically attached, so ok to proceed with Zometa)   ??  Summary:   - RTC in 2 weeks.   - Day 90 BMBx indicates ongoing CR1 MRD-. >95% donor chimera.   - Taper tacrolimus to 0.5 mg BID.  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   - Zometa today.     I personally spent 40 minutes face-to-face and non-face-to-face in the care of this patient, which includes all pre, intra, and post visit time on the date of service.        Westley Hummer ANP-BC, AOCNP  [facility]    I personally spent 45 minutes face-to-face and non-face-to-face in the care of this patient, which includes all pre, intra, and post visit time on the date of service.    Future Appointments   Date Time Provider Department Center   06/20/2020 11:00 AM ONCINF CHAIR 08 [id number]   06/27/2020  8:45 AM ONCBMT LABS [facility]   06/27/2020  9:00 AM ONCBMT APP A  [facility]   07/04/2020 10:15 AM ONCBMT LABS [facility]   07/04/2020 10:45 AM Artelia Laroche, MD [facility]

## 2020-06-20 NOTE — Unmapped (Signed)
Patient received Zometa infusion according to therapy plan protocol. His port was flushed, heparin locked, and de-accessed. He declined an AVS and was discharged.

## 2020-06-20 NOTE — Unmapped (Signed)
All lab results last 24 hours:    Recent Results (from the past 24 hour(s))   Comprehensive Metabolic Panel    Collection Time: 06/20/20  9:23 AM   Result Value Ref Range    Sodium 138 135 - 145 mmol/L    Potassium 3.6 3.4 - 4.5 mmol/L    Chloride 107 98 - 107 mmol/L    Anion Gap 5 5 - 14 mmol/L    CO2 26.0 20.0 - 31.0 mmol/L    BUN 16 9 - 23 mg/dL    Creatinine 2.95 (H) 0.60 - 1.10 mg/dL    BUN/Creatinine Ratio 13     EGFR CKD-EPI Non-African American, Male 62 >=60 mL/min/1.64m2    EGFR CKD-EPI African American, Male 30 >=60 mL/min/1.45m2    Glucose 176 70 - 179 mg/dL    Calcium 9.5 8.7 - 62.1 mg/dL    Albumin 3.4 3.4 - 5.0 g/dL    Total Protein 5.8 5.7 - 8.2 g/dL    Total Bilirubin 0.6 0.3 - 1.2 mg/dL    AST 18 <=30 U/L    ALT 8 (L) 10 - 49 U/L    Alkaline Phosphatase 60 46 - 116 U/L   Magnesium Level    Collection Time: 06/20/20  9:23 AM   Result Value Ref Range    Magnesium 1.4 (L) 1.6 - 2.6 mg/dL   CBC w/ Differential    Collection Time: 06/20/20  9:23 AM   Result Value Ref Range    WBC 4.6 4.5 - 11.0 10*9/L    RBC 2.62 (L) 4.50 - 5.90 10*12/L    HGB 9.4 (L) 13.5 - 17.5 g/dL    HCT 86.5 (L) 78.4 - 53.0 %    MCV 114.2 (H) 80.0 - 100.0 fL    MCH 35.8 (H) 26.0 - 34.0 pg    MCHC 31.4 31.0 - 37.0 g/dL    RDW 69.6 (H) 29.5 - 15.0 %    MPV 11.4 (H) 7.0 - 10.0 fL    Platelet 96 (L) 150 - 440 10*9/L    Neutrophils % 76.3 %    Lymphocytes % 15.0 %    Monocytes % 5.7 %    Eosinophils % 0.9 %    Basophils % 0.1 %    Absolute Neutrophils 3.5 2.0 - 7.5 10*9/L    Absolute Lymphocytes 0.7 (L) 1.5 - 5.0 10*9/L    Absolute Monocytes 0.3 0.2 - 0.8 10*9/L    Absolute Eosinophils 0.0 0.0 - 0.4 10*9/L    Absolute Basophils 0.0 0.0 - 0.1 10*9/L    Large Unstained Cells 2 0 - 4 %    Macrocytosis Marked (A) Not Present    Anisocytosis Slight (A) Not Present    Hypochromasia Marked (A) Not Present     No visit next week.   We are going to have you decrease the tacrolimus to 1 tab twice per day.

## 2020-06-22 LAB — EBV QUANTITATIVE PCR, BLOOD: EBV VIRAL LOAD RESULT: NOT DETECTED

## 2020-06-23 DIAGNOSIS — C9201 Acute myeloblastic leukemia, in remission: Principal | ICD-10-CM

## 2020-06-23 DIAGNOSIS — Z9484 Stem cells transplant status: Principal | ICD-10-CM

## 2020-07-03 NOTE — Unmapped (Unsigned)
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  Transplant was tolerated well, complicated by mucositis requiring PCA.    Interval History:   John Erickson is here today for routine follow up.  He states that he has been feeling well.   He is doing better but still not great.   He is eating better but still not good. He constantly feels on the verge of nausea and abdominal pain. He does vomit about twice a day.   He has 5 small meals.  Breakfast is yogurt, lunch is soup or left overs, at dinner he has protein and veggies.  At night he will eat a dessert.  Doing about 900 in calories burning much more.    He takes phenergan as needed 1-2 pills a day.   Denies diarrhea. Has 2-3 formed stools a day.   Skin is good. His partner instated a skin moisture regimen which has worked well.   C/O feet tingling.      Discussed Evusheld again today and he decided he will consent to take the drug next clinic visit.   He has not been vaccinated.    ROS:  A comprehensive ROS performed and is negative except for pertinent positives as listed above in interval history.     I reviewed and updated past medical, surgical, social, and family history as appropriate.     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,XY[1]    Variants of Known or Likely Clinical Significance  Gene Transcript Predicted Protein  VAF (%)   CEBPA c.753_762del p.Ser251Argfs 41      Variants of Unknown Significance  Gene Transcript Predicted Protein VAF (%)   TET2 c.4946A>G p.Tyr1649Cys 49      -  FLT-3-ITD and FLT-3-TKD studies are negative    Pertinent Phenotypic data:    Disease-specific prognostic estimate:     Induction:   7+3+HD Dauno (C1D1 2/24)    Recovery Marrow:    Final Diagnosis   Date Value Ref Range Status   08/12/2019   Final    Bone marrow, right iliac, aspiration and biopsy  -  Normocellular bone marrow (60%) with trilineage hematopoiesis and 4% blasts by manual aspirate differential  -  Routine cytogenetic analysis is pending  -  Flow cytometric MRD analysis is pending    This electronic signature is attestation that the pathologist personally reviewed the submitted material(s) and the final diagnosis reflects that evaluation.            Genetics:   Karyotype/FISH:   RESULTS   Date Value Ref Range Status   07/09/2019   Final    Abnormal Karyotype: 46,Y,t(X;8)(q26;q11.2)[19]/46,XY[1]    Normal FISH:  An interphase FISH assay shows no evidence of a rearrangement involving the  KMT2A (MLL) gene region in the 200 nuclei scored (see below).           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,XY[1]  Variants of Known or Likely Clinical Significance  Gene Transcript  Predicted Protein  VAF (%)   CEBPA c.753_762del p.Ser251Argfs 41      Variants of Unknown Significance  Gene Transcript Predicted Protein VAF (%)   TET2 c.4946A>G p.Tyr1649Cys 49      -  FLT-3-ITD and FLT-3-TKD studies are negative     07/09/2019 Initial Diagnosis    AML (acute myeloid leukemia) (CMS-HCC)     07/14/2019 - 07/20/2019 Chemotherapy    IP LEUKEMIA 7+3 HIGH DOSE DAUNOrubicin  DAUNOrubicin 90 mg/m2 IV on Days 1, 2, 3  Cytarabine 100 mg/m2 CIVI on Days 1 to 7     07/27/2019 Biopsy    Bone marrow, right iliac, aspiration and biopsy  -  Hypocellular bone marrow (<5%) with treatment effect, markedly reduced trilineage hematopoiesis, and 3% blasts by manual aspirate differential (see Comment).     08/24/2019 - 09/28/2019 Chemotherapy    IP LEUKEMIA HIGH DOSE CYTARABINE CONSOLIDATION (3 G/M2)  cytarabine 3 g/m2 every 12 hours on days 1, 3 and 5 every 28 days     08/24/2019 - 12/03/2019 Chemotherapy    IP LEUKEMIA HIGH DOSE CYTARABINE CONSOLIDATION (3 G/M2) ON DAYS 1,2,3  cytarabine 3 g/m2 IV every 12 hours on days 1, 2, 3, every 28 days     01/19/2020 - 01/19/2020 Chemotherapy    BMT OP BUSULFAN TEST DOSE  Busulfan 0.8 mg/kg IV ONCE     01/26/2020 -  Chemotherapy    BMT IP MAC BUSULFAN / FLUDARABINE / rATG (MUD)   Busulfan IV Days -6 to -3  Fludarabine 40 mg/m2 IV Days -6 to -3  rATG 0.5 mg/kg IV Day -3, 1.5 mg/kg IV Day -2, 2.5 mg/kg IV Day -1       Physical exam:  BP 123/82  - Pulse 77  - Temp 36.5 ??C (97.7 ??F) (Oral)  - Resp 18  - Wt 70.8 kg (156 lb)  - SpO2 97%  - BMI 24.43 kg/m??     80, Normal activity with effort; some signs or symptoms of disease (ECOG equivalent 1)    General: No acute distress noted. Resolving alopecia.   Central Venous Access: Port accesssed.   ENT: Moist mucous membranes. Oropharhynx without lesions, erythema or exudate.   Cardiovascular: Pulse normal rate, regularity and rhythm. S1 and S2 normal, without any murmur, rub, or gallop.  Lungs: Clear to auscultation bilaterally, without wheezes/crackles/rhonchi. Good air movement.   Skin: No new lesions. No rash or abrasions. Bronzy skin color.   Psychiatry: Alert and oriented to person, place, and time.   Gastrointestinal/Abdomen: Normoactive bowel sounds, abdomen soft, non-tender   Musculoskeletal/Extremities: FROM throughout. +1.5 BLE edema ankles/shins.     Neurologic: CNII-XII intact. Normal strength and sensation throughout    Labs:  I reviewed all labs from today in Epic. See EMR for lab results.    Lab Results   Component Value Date    WBC 4.6 06/20/2020    HGB 9.4 (L) 06/20/2020    HCT 29.9 (L) 06/20/2020    PLT  96 (L) 06/20/2020     Lab Results   Component Value Date    NA 138 06/20/2020    K 3.6 06/20/2020    CL 107 06/20/2020    CO2 26.0 06/20/2020    BUN 16 06/20/2020    CREATININE 1.19 (H) 06/20/2020    GLU 176 06/20/2020    CALCIUM 9.5 06/20/2020    MG 1.4 (L) 06/20/2020    PHOS 4.1 02/22/2020     Lab Results   Component Value Date    BILITOT 0.6 06/20/2020    BILIDIR 0.30 02/21/2020    PROT 5.8 06/20/2020    ALBUMIN 3.4 06/20/2020    ALT 8 (L) 06/20/2020    AST 18 06/20/2020    ALKPHOS 60 06/20/2020    GGT 21 01/26/2020     Lab Results   Component Value Date    PT 14.1 (H) 02/22/2020    INR 1.21 02/22/2020    APTT 33.2 02/17/2020       Shipping working Merchandiser, 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  Has Day 58 BmBx was scheduled for 12/8, but now moved to 12/15 since he was later arriving today.    - 05/03/20: Bone marrow biopsy confirms ongoing CR1 with MRD-. 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     GVHD prophylaxis:??  1.Tacrolimus being managed by pharmacy with a goal of 5-10 ng/mL.   05/10/20: The dose has been increased to aim for more therapeutic level - in face of persistent fluid retention and persistent GI symptoms.   Today tacrolimus level is pending. Cr is 2.89, which is new. We wonder if increase in tacrolimus level and his relatively poor oral intake is not responsible fot this. Will hold tacrolimus pending level.     05/31/20: Plan to taper tacrolimus by 0.5 mg every 2 weeks:  1/19: 1 mg/0.5 mg  2/2:  0.5mg /0.5mg   2/16: 0.5 mg qPM.   3/1: 0.5mg  every other PM  3/15: Stop    2. Methotrexate??15 mg/m2 IVP on day +1 then 10 mg/m2 on days +3, +6 and +11  3. ATG per [facility] standard dosing was included  ??  Heme:??  Transfusion criteria:??1 unit of PRBCs for Hgb<7 and 1 unit of platelets for Plt <10K or bleeding.   -??No history of transfusion reactions.   - Plts decreased, but now slowly improving.  Possibly related to whichever medication caused edema or slow engraftment.     ID:??  Prophylaxis:  - Antiviral: Valtrex 500mg  daily   - Antifungal: None  - PJP: Begin Dapsone 100mg  PO daily (has Sulfa allergy) on 12/1. He has supply but still needs to start taking this.   - CMV D+/R+: Restarted Letermovir prophy on 12/1, 480mg  po daily, Continue through D+100. Tressia Miners has been stopped.     C.Diff:   - Mild symptoms, positive on 12/1  - Oral Vancomycin (12/2-12/11) completed 10 day course  - Repeat C.diff 12/15 was negative    Vesicular lesions:  - Initial AC fossa  lesion 10/25, lesions were migratory and vesicular in a linear fashion  - Swab and blood both negative for VZV or HSV.  Trial of treatment dose Valtex did not change rash.    - As of 11/16 lesions resolved and no further rash.   ??  Allergy:  - PCN allergy (hives) has tolerated Cefepime without issues    Seasonal Allergies:  - Flonase and Claritin daily prn.   ??  EBV:  - IgM Ab + 01/05/20 but viral load 01/05/20 not detected.   - Viral Load 12/8, not detected    *Full virals sent 11/9 given mild thrombocytopenia and rash this panel was negative.  ??  Renal:   AKI:   - 05/10/20: Scr 2.89 today. Held tacrolimus and started IVF. Patient was instructed to drink plenty of fluids, preferably more than 3L/day, if possible.  - 06/20/20: Cr 1.1    FEN:   Hypomagnesemia:  Stopped supplement last week.  Mag 1.4  We will Futcher taper tac today.   ??  Hepatic:??  - No active issues.   - VOD prophylaxis with Actigall held since 11/17 due to swelling/rash.      Pulm: DLCO 62%  - Former smoker, quit 01/12/20. Using nicotine patch currently. Has chronic dry cough but PFTs were acceptable.   07/24/19: CT chest w/ 0.6cm RUL nodules, no clear etiology.     ** Discussed with Dr. Oswald Hillock prior to transplant, ok to move forward, no plans to repeat unless new findings.       Neuro/Pain:??  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     Psychosocial:??  - Substance abuse (opioid history). Sober for 5 years.  - Followed by Frederik Schmidt in [facility], placed referral for [facility] while admitted, are not seeing inpatients, will follow- up with him post discharge.   - Insomnia: Continue Trazodone 50mg  nightly prn.     Bone health:  Dexa scan from 10/13 resulted and shows low bone density.   - Seen by pharmacy and discussed zometa and calcium supplemenation 10/22  - Zometa first dose on 03/23/20.  Will need to monitor for recurrence of swelling when we give second dose since it occurred right after 1st dose.    ** He has a dental appointment 12/9 but just for impressions for partial implant (it is a non-invasive dental procedure as the implants are clip on with a retainer, not surgically attached, so ok to proceed with Zometa)   ??  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   - Tacrolimus taper. Follow closely. 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   - Consider Zyprexa.   - RTC in 2 weeks.       Future Appointments   Date Time Provider Department Center   07/04/2020 10:15 AM [facility]MT LABS [facility]   07/04/2020 10:45 AM Artelia Laroche, MD [facility] MD [facility]

## 2020-07-04 ENCOUNTER — Encounter: Admit: 2020-07-04 | Discharge: 2020-07-04 | Payer: PRIVATE HEALTH INSURANCE

## 2020-07-04 DIAGNOSIS — R110 Nausea: Principal | ICD-10-CM

## 2020-07-04 DIAGNOSIS — C9201 Acute myeloblastic leukemia, in remission: Principal | ICD-10-CM

## 2020-07-04 DIAGNOSIS — G4700 Insomnia, unspecified: Principal | ICD-10-CM

## 2020-07-04 DIAGNOSIS — Z9221 Personal history of antineoplastic chemotherapy: Principal | ICD-10-CM

## 2020-07-04 DIAGNOSIS — Z880 Allergy status to penicillin: Principal | ICD-10-CM

## 2020-07-04 DIAGNOSIS — J302 Other seasonal allergic rhinitis: Principal | ICD-10-CM

## 2020-07-04 DIAGNOSIS — Z87891 Personal history of nicotine dependence: Principal | ICD-10-CM

## 2020-07-04 DIAGNOSIS — Z9484 Stem cells transplant status: Principal | ICD-10-CM

## 2020-07-04 LAB — COMPREHENSIVE METABOLIC PANEL
ALBUMIN: 3.4 g/dL (ref 3.4–5.0)
ALKALINE PHOSPHATASE: 60 U/L (ref 46–116)
ALT (SGPT): 11 U/L (ref 10–49)
ANION GAP: 3 mmol/L — ABNORMAL LOW (ref 5–14)
AST (SGOT): 20 U/L (ref <=34)
BILIRUBIN TOTAL: 0.5 mg/dL (ref 0.3–1.2)
BLOOD UREA NITROGEN: 17 mg/dL (ref 9–23)
BUN / CREAT RATIO: 14
CALCIUM: 9 mg/dL (ref 8.7–10.4)
CHLORIDE: 111 mmol/L — ABNORMAL HIGH (ref 98–107)
CO2: 26 mmol/L (ref 20.0–31.0)
CREATININE: 1.22 mg/dL — ABNORMAL HIGH
EGFR CKD-EPI AA MALE: 76 mL/min/{1.73_m2} (ref >=60)
EGFR CKD-EPI NON-AA MALE: 65 mL/min/{1.73_m2} (ref >=60)
GLUCOSE RANDOM: 100 mg/dL (ref 70–179)
POTASSIUM: 4 mmol/L (ref 3.4–4.5)
PROTEIN TOTAL: 5.6 g/dL — ABNORMAL LOW (ref 5.7–8.2)
SODIUM: 140 mmol/L (ref 135–145)

## 2020-07-04 LAB — CBC W/ AUTO DIFF
BASOPHILS ABSOLUTE COUNT: 0 10*9/L (ref 0.0–0.1)
BASOPHILS RELATIVE PERCENT: 0.3 %
EOSINOPHILS ABSOLUTE COUNT: 0 10*9/L (ref 0.0–0.4)
EOSINOPHILS RELATIVE PERCENT: 0.8 %
HEMATOCRIT: 28.6 % — ABNORMAL LOW (ref 41.0–53.0)
HEMOGLOBIN: 9.3 g/dL — ABNORMAL LOW (ref 13.5–17.5)
LARGE UNSTAINED CELLS: 2 % (ref 0–4)
LYMPHOCYTES ABSOLUTE COUNT: 0.6 10*9/L — ABNORMAL LOW (ref 1.5–5.0)
LYMPHOCYTES RELATIVE PERCENT: 15.5 %
MEAN CORPUSCULAR HEMOGLOBIN CONC: 32.6 g/dL (ref 31.0–37.0)
MEAN CORPUSCULAR HEMOGLOBIN: 37.6 pg — ABNORMAL HIGH (ref 26.0–34.0)
MEAN CORPUSCULAR VOLUME: 115.2 fL — ABNORMAL HIGH (ref 80.0–100.0)
MEAN PLATELET VOLUME: 10.5 fL — ABNORMAL HIGH (ref 7.0–10.0)
MONOCYTES ABSOLUTE COUNT: 0.3 10*9/L (ref 0.2–0.8)
MONOCYTES RELATIVE PERCENT: 6.5 %
NEUTROPHILS ABSOLUTE COUNT: 3 10*9/L (ref 2.0–7.5)
NEUTROPHILS RELATIVE PERCENT: 75.3 %
PLATELET COUNT: 97 10*9/L — ABNORMAL LOW (ref 150–440)
RED BLOOD CELL COUNT: 2.49 10*12/L — ABNORMAL LOW (ref 4.50–5.90)
RED CELL DISTRIBUTION WIDTH: 16.5 % — ABNORMAL HIGH (ref 12.0–15.0)
WBC ADJUSTED: 4 10*9/L — ABNORMAL LOW (ref 4.5–11.0)

## 2020-07-04 LAB — SLIDE REVIEW

## 2020-07-04 LAB — VITAMIN B12: VITAMIN B-12: 292 pg/mL (ref 211–911)

## 2020-07-04 LAB — TACROLIMUS LEVEL: TACROLIMUS BLOOD: <1 ng/mL

## 2020-07-04 LAB — MAGNESIUM: MAGNESIUM: 1.6 mg/dL (ref 1.6–2.6)

## 2020-07-04 MED ORDER — OLANZAPINE 2.5 MG TABLET
ORAL_TABLET | Freq: Every evening | ORAL | 2 refills | 30 days | Status: CP
Start: 2020-07-04 — End: 2021-07-04

## 2020-07-04 MED ADMIN — heparin, porcine (PF) 100 unit/mL injection 500 Units: 500 [IU] | INTRAVENOUS | @ 15:00:00 | Stop: 2020-07-04

## 2020-07-05 DIAGNOSIS — Z9484 Stem cells transplant status: Principal | ICD-10-CM

## 2020-07-05 LAB — CMV DNA, QUANTITATIVE, PCR: CMV VIRAL LD: NOT DETECTED

## 2020-07-05 MED ORDER — TACROLIMUS 0.5 MG CAPSULE, IMMEDIATE-RELEASE
ORAL_CAPSULE | Freq: Every day | ORAL | 5 refills | 30 days | Status: CP
Start: 2020-07-05 — End: ?
  Filled 2020-07-11: qty 30, 30d supply, fill #0

## 2020-07-05 NOTE — Unmapped (Signed)
[facility] Refill Coordination Note    Specialty Medication(s) to be Shipped:   Transplant: tacrolimus 0.5mg     Other medication(s) to be shipped: No additional medications requested for fill at this time     John Erickson, DOB: 01-01-1961  Phone: [phone] (home)       All above HIPAA information was verified with patient.     Was a Nurse, learning disability used for this call? No    Completed refill call assessment today to schedule patient's medication shipment from the [facility] [phone]).       Specialty medication(s) and dose(s) confirmed: Patient reports changes to the regimen as follows: 1 CAPSULE DAILY    Changes to medications: John Erickson reports no changes at this time.  Changes to insurance: No  Questions for the pharmacist: No    Confirmed patient received Welcome Packet with first shipment. The patient will receive a drug information handout for each medication shipped and additional FDA Medication Guides as required.       DISEASE/MEDICATION-SPECIFIC INFORMATION        N/A    SPECIALTY MEDICATION ADHERENCE     Medication Adherence    Patient reported X missed doses in the last month: 0  Specialty Medication: TACROLIMUS 0.5MG    Patient is on additional specialty medications: No  Informant: patient  Confirmed plan for next specialty medication refill: delivery by pharmacy  Refills needed for supportive medications: not needed          Refill Coordination    Has the Patients' Contact Information Changed: No  Is the Shipping Address Different: No           TACROLIMUS 0.5 mg: 14 days of medicine on hand         SHIPPING     Shipping address confirmed in Epic.     Delivery Scheduled: Yes, Expected medication delivery date: 2/22.  However, Rx request for refills was sent to the provider as there are none remaining.     Medication will be delivered via UPS to the prescription address in Epic WAM.    Jolene Schimke   [facility] Specialty Technician

## 2020-07-05 NOTE — Unmapped (Signed)
Clinical Assessment Needed For: Dose Change  Medication: Tacrolimus 0.5mg  capsule  Last Fill Date/Day Supply: 06/14/2020 / 30 days  Refill Too Soon until 07/06/2020  Was previous dose already scheduled to fill: Yes    Notes to Pharmacist: Scheduled to fill 02/22. Will re-test on 02/17

## 2020-07-06 LAB — EBV QUANTITATIVE PCR, BLOOD: EBV VIRAL LOAD RESULT: NOT DETECTED

## 2020-07-06 NOTE — Unmapped (Signed)
[facility] Pharmacist Intervention    Type of intervention: Dose change    Medication: Tacrolimus 0.5 mg    Problem: Tac dose decreased.  [facility] received new script.    Intervention: Confirmed new dose.  Per provider notes:  05/31/20: Plan to taper tacrolimus by 0.5 mg every 2 weeks:  1/19: 1 mg/0.5 mg  2/2:  0.5mg /0.5mg  Current dose   2/16: 0.5 mg qPM  3/1: 0.5mg  every other PM  3/15: Stop    Follow up needed: not at this time    Approximate time spent: 5 minutes    John Erickson [facility] Specialty Pharmacist

## 2020-07-06 NOTE — Unmapped (Signed)
[name] called and left message requesting an appointment to receive Evushield, reviewed with pharmacy that he would need to be consented and called him back, he would like to come Monday and not wait until his next appointment in 2 weeks, appointment made for Monday with pharmacy and then injection appointment afterwards

## 2020-07-06 NOTE — Unmapped (Signed)
Therapy Update Follow Up: No issues - Copay = $0

## 2020-07-10 ENCOUNTER — Encounter
Admit: 2020-07-10 | Discharge: 2020-07-10 | Payer: PRIVATE HEALTH INSURANCE | Attending: Pharmacist Clinician (PhC)/ Clinical Pharmacy Specialist | Primary: Pharmacist Clinician (PhC)/ Clinical Pharmacy Specialist

## 2020-07-10 ENCOUNTER — Encounter: Admit: 2020-07-10 | Discharge: 2020-07-10 | Payer: PRIVATE HEALTH INSURANCE

## 2020-07-10 DIAGNOSIS — C9201 Acute myeloblastic leukemia, in remission: Principal | ICD-10-CM

## 2020-07-10 DIAGNOSIS — Z9484 Stem cells transplant status: Principal | ICD-10-CM

## 2020-07-10 MED ADMIN — tixagevimab-cilgavimab 150 mg/1.5 mL- 150 mg/1.5 mL injection 3 mL: 3 mL | INTRAMUSCULAR | @ 15:00:00 | Stop: 2020-07-10

## 2020-07-10 NOTE — Unmapped (Signed)
After identifying the patient as meeting the EUA indications and institutional guidelines for use of Evusheld (tixagevimab co-packaged with cilgavimab), I discussed with the patient the indications and alternates to Evusheld  as pre-exposure prophylaxis during the COVID-19 pandemic under the FDA Emergency Use Authorization.  I described the meaning of EUA status, the administration, potential toxicities and limitations of Evusheld for prevention of severe COVID-19 infection.  I provided the patient with the FDA EUA FAQ and information for patients and caregivers documents. After this discussion and review of the provided information, the patient elected to receive Evusheld.    Time spent with patient: 10 minutes     Rulon Abide, PharmD, BCOP  Clinical Pharmacist Practitioner, BMTCT    Lanae Boast, MD  Associate Professor  Hematology/Oncology BMTCTP

## 2020-07-14 DIAGNOSIS — G5793 Unspecified mononeuropathy of bilateral lower limbs: Principal | ICD-10-CM

## 2020-07-14 MED ORDER — OLANZAPINE 5 MG TABLET
ORAL_TABLET | Freq: Every evening | ORAL | 2 refills | 30 days | Status: CP
Start: 2020-07-14 — End: 2021-07-14

## 2020-07-14 MED ORDER — PREGABALIN 75 MG CAPSULE
ORAL_CAPSULE | ORAL | 0 refills | 30 days | Status: CN
Start: 2020-07-14 — End: ?

## 2020-07-18 ENCOUNTER — Other Ambulatory Visit: Admit: 2020-07-18 | Discharge: 2020-07-19 | Payer: PRIVATE HEALTH INSURANCE

## 2020-07-18 ENCOUNTER — Encounter
Admit: 2020-07-18 | Discharge: 2020-07-19 | Payer: PRIVATE HEALTH INSURANCE | Attending: Primary Care | Primary: Primary Care

## 2020-07-18 DIAGNOSIS — G5793 Unspecified mononeuropathy of bilateral lower limbs: Principal | ICD-10-CM

## 2020-07-18 DIAGNOSIS — Z9484 Stem cells transplant status: Principal | ICD-10-CM

## 2020-07-18 DIAGNOSIS — C9201 Acute myeloblastic leukemia, in remission: Principal | ICD-10-CM

## 2020-07-18 DIAGNOSIS — M545 Bilateral low back pain without sciatica, unspecified chronicity: Principal | ICD-10-CM

## 2020-07-18 DIAGNOSIS — D84822 Immunocompromised state associated with stem cell transplant (CMS-HCC): Principal | ICD-10-CM

## 2020-07-18 DIAGNOSIS — R110 Nausea: Principal | ICD-10-CM

## 2020-07-18 LAB — COMPREHENSIVE METABOLIC PANEL
ALBUMIN: 3.5 g/dL (ref 3.4–5.0)
ALKALINE PHOSPHATASE: 66 U/L (ref 46–116)
ALT (SGPT): 17 U/L (ref 10–49)
ANION GAP: 4 mmol/L — ABNORMAL LOW (ref 5–14)
AST (SGOT): 25 U/L (ref <=34)
BILIRUBIN TOTAL: 0.4 mg/dL (ref 0.3–1.2)
BLOOD UREA NITROGEN: 21 mg/dL (ref 9–23)
BUN / CREAT RATIO: 18
CALCIUM: 9.4 mg/dL (ref 8.7–10.4)
CHLORIDE: 107 mmol/L (ref 98–107)
CO2: 27 mmol/L (ref 20.0–31.0)
CREATININE: 1.15 mg/dL — ABNORMAL HIGH
EGFR CKD-EPI AA MALE: 81 mL/min/{1.73_m2} (ref >=60)
EGFR CKD-EPI NON-AA MALE: 70 mL/min/{1.73_m2} (ref >=60)
GLUCOSE RANDOM: 151 mg/dL (ref 70–179)
POTASSIUM: 3.9 mmol/L (ref 3.4–4.8)
PROTEIN TOTAL: 5.9 g/dL (ref 5.7–8.2)
SODIUM: 138 mmol/L (ref 135–145)

## 2020-07-18 LAB — CBC W/ AUTO DIFF
BASOPHILS ABSOLUTE COUNT: 0 10*9/L (ref 0.0–0.1)
BASOPHILS RELATIVE PERCENT: 0.2 %
EOSINOPHILS ABSOLUTE COUNT: 0 10*9/L (ref 0.0–0.4)
EOSINOPHILS RELATIVE PERCENT: 1.1 %
HEMATOCRIT: 30.8 % — ABNORMAL LOW (ref 41.0–53.0)
HEMOGLOBIN: 10 g/dL — ABNORMAL LOW (ref 13.5–17.5)
LARGE UNSTAINED CELLS: 4 % (ref 0–4)
LYMPHOCYTES ABSOLUTE COUNT: 0.5 10*9/L — ABNORMAL LOW (ref 1.5–5.0)
LYMPHOCYTES RELATIVE PERCENT: 14.4 %
MEAN CORPUSCULAR HEMOGLOBIN CONC: 32.4 g/dL (ref 31.0–37.0)
MEAN CORPUSCULAR HEMOGLOBIN: 36.7 pg — ABNORMAL HIGH (ref 26.0–34.0)
MEAN CORPUSCULAR VOLUME: 113 fL — ABNORMAL HIGH (ref 80.0–100.0)
MEAN PLATELET VOLUME: 10.3 fL — ABNORMAL HIGH (ref 7.0–10.0)
MONOCYTES ABSOLUTE COUNT: 0.3 10*9/L (ref 0.2–0.8)
MONOCYTES RELATIVE PERCENT: 10.2 %
NEUTROPHILS ABSOLUTE COUNT: 2.2 10*9/L (ref 2.0–7.5)
NEUTROPHILS RELATIVE PERCENT: 69.9 %
PLATELET COUNT: 102 10*9/L — ABNORMAL LOW (ref 150–440)
RED BLOOD CELL COUNT: 2.72 10*12/L — ABNORMAL LOW (ref 4.50–5.90)
RED CELL DISTRIBUTION WIDTH: 15.2 % — ABNORMAL HIGH (ref 12.0–15.0)
WBC ADJUSTED: 3.2 10*9/L — ABNORMAL LOW (ref 4.5–11.0)

## 2020-07-18 LAB — SLIDE REVIEW

## 2020-07-18 LAB — MAGNESIUM: MAGNESIUM: 1.8 mg/dL (ref 1.6–2.6)

## 2020-07-18 MED ORDER — PREGABALIN 75 MG CAPSULE
ORAL_CAPSULE | ORAL | 1 refills | 30 days | Status: CP
Start: 2020-07-18 — End: ?

## 2020-07-18 MED ORDER — TACROLIMUS 0.5 MG CAPSULE, IMMEDIATE-RELEASE
ORAL_CAPSULE | Freq: Two times a day (BID) | ORAL | 5 refills | 15 days | Status: CP
Start: 2020-07-18 — End: 2020-08-17

## 2020-07-18 MED ORDER — CYCLOBENZAPRINE 5 MG TABLET
ORAL_TABLET | Freq: Three times a day (TID) | ORAL | 0 refills | 10 days | Status: CP | PRN
Start: 2020-07-18 — End: 2020-08-17

## 2020-07-18 MED ADMIN — heparin, porcine (PF) 100 unit/mL injection 500 Units: 500 [IU] | INTRAVENOUS | @ 17:00:00 | Stop: 2020-07-18

## 2020-07-18 NOTE — Unmapped (Signed)
Keep tacrolimus 0.5 mg twice daily    Keep Zyprexa at bedtime or can split dose to twice daily    Let us know if nausea does not get better or if you start having diarrhea or rash.     If nausea persists may need to evaluate with upper EGD to look in stomach and make sure no GVHD    For back continue tylenol or ibuprofen, can also use flexeril as needed    We will see you back in 2 weeks.         Lab Results   Component Value Date    WBC 3.2 (L) 07/18/2020    HGB 10.0 (L) 07/18/2020    HCT 30.8 (L) 07/18/2020    PLT 102 (L) 07/18/2020     Lab Results   Component Value Date    NA 138 07/18/2020    K 3.9 07/18/2020    CL 107 07/18/2020    CO2 27.0 07/18/2020    BUN 21 07/18/2020    CREATININE 1.15 (H) 07/18/2020    GLU 151 07/18/2020    CALCIUM 9.4 07/18/2020    MG 1.8 07/18/2020    PHOS 4.1 02/22/2020     Lab Results   Component Value Date    BILITOT 0.4 07/18/2020    BILIDIR 0.30 02/21/2020    PROT 5.9 07/18/2020    ALBUMIN 3.5 07/18/2020    ALT 17 07/18/2020    AST 25 07/18/2020    ALKPHOS 66 07/18/2020    GGT 21 01/26/2020     Lab Results   Component Value Date    INR 1.21 02/22/2020    APTT 33.2 02/17/2020       For prescription refills:   For refills, please check your medication bottles to see if you have additional refills left. If so, please call your pharmacy and follow the directions to request a refill. If you do not have any refills left, please make a request during your clinic visit or by submitting a request through MyChart or by calling [phone]. Please allow 24 hours if your request is made during the week or 48 hours if requests are made on the weekends or holidays.     --------------------------------------------------------------------------------------------------------------------  For appointments & questions Monday through Friday 8 AM-4:30 PM     Please call [phone] or Toll free [phone]    On Nights, Weekends and Holidays  Call [phone] and ask for the oncologist on call    Please visit PrivacyFever.cz, a resource created just for family members and caregivers.  This website lists support services, how and where to ask for help. It has tools to assist you as you help [place] care for your loved one.    [facility]  [address]  [place], [place] [address]  [email or web address]

## 2020-07-19 LAB — EBV QUANTITATIVE PCR, BLOOD: EBV VIRAL LOAD RESULT: NOT DETECTED

## 2020-07-19 LAB — CMV DNA, QUANTITATIVE, PCR: CMV VIRAL LD: NOT DETECTED

## 2020-07-19 NOTE — Unmapped (Signed)
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  Transplant was tolerated well, complicated by mucositis requiring PCA.    Interval History:   John Erickson is here today for routine follow up. Since his last visit he had increased upper GI symptoms, nausea,dry heaves. He had decreased tacrolimus to 0.5 mg daily at visit on 2/16. End of last week self increased his dose back to 0.5 mg po bid. This was on Friday. Since then he has been feeling a little better. Weight is down, he is 150 lb today, previous visit was 159 lb, prior to this was 156 lb. He reports he is eating. He has had some vomiting. Gets full easily. Unable to eat carbs well, has a hard time with bread. He is drinking water and sweet tea. His meals are smaller kids meal size. He can do eggs, toast, hash browns, meat and veggies. He takes phenergan as needed 1-2 pills a day.   Denies diarrhea. Has 2-3 formed stools a day.   Skin is good without rash, just dry. He is having increased back pain after having dry heaves end of last week, thinks he pulled a back muscle. He has been using tylenol and ibuprofen.     ROS:  A comprehensive ROS performed and is negative except for pertinent positives as listed above in interval history.     I reviewed and updated past medical, surgical, social, and family history as appropriate.     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,XY[1]    Variants of Known or Likely Clinical Significance  Gene Transcript  Predicted Protein  VAF (%)   CEBPA c.753_762del p.Ser251Argfs 41      Variants of Unknown Significance  Gene Transcript Predicted Protein VAF (%)   TET2 c.4946A>G p.Tyr1649Cys 49      -  FLT-3-ITD and FLT-3-TKD studies are negative    Pertinent Phenotypic data:    Disease-specific prognostic estimate:     Induction:   7+3+HD Dauno (C1D1 2/24)    Recovery Marrow:    Final Diagnosis   Date Value Ref Range Status   08/12/2019   Final    Bone marrow, right iliac, aspiration and biopsy  -  Normocellular bone marrow (60%) with trilineage hematopoiesis and 4% blasts by manual aspirate differential  -  Routine cytogenetic analysis is pending  -  Flow cytometric MRD analysis is pending    This electronic signature is attestation that the pathologist personally reviewed the submitted material(s) and the final diagnosis reflects that evaluation.            Genetics:   Karyotype/FISH:   RESULTS   Date Value Ref Range Status   07/09/2019   Final    Abnormal Karyotype: 46,Y,t(X;8)(q26;q11.2)[19]/46,XY[1]  Normal FISH:  An interphase FISH assay shows no evidence of a rearrangement involving the KMT2A (MLL) gene region in the 200 nuclei scored (see below).           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,XY[1]  Variants of Known or Likely Clinical Significance  Gene Transcript  Predicted Protein  VAF (%)   CEBPA c.753_762del p.Ser251Argfs 41      Variants of Unknown Significance  Gene Transcript Predicted Protein VAF (%)   TET2 c.4946A>G p.Tyr1649Cys 49      -  FLT-3-ITD and FLT-3-TKD studies are negative     07/09/2019 Initial Diagnosis    AML (acute myeloid leukemia) (CMS-HCC)     07/14/2019 - 07/20/2019 Chemotherapy    IP LEUKEMIA 7+3 HIGH DOSE DAUNOrubicin  DAUNOrubicin 90 mg/m2 IV on Days 1, 2, 3  Cytarabine 100 mg/m2 CIVI on Days 1 to 7     07/27/2019 Biopsy    Bone marrow, right iliac, aspiration and biopsy  -  Hypocellular bone marrow (<5%) with treatment effect, markedly reduced trilineage hematopoiesis, and 3% blasts by manual aspirate differential (see Comment).     08/24/2019 - 09/28/2019 Chemotherapy    IP LEUKEMIA HIGH DOSE CYTARABINE CONSOLIDATION (3 G/M2)  cytarabine 3 g/m2 every 12 hours on days 1, 3 and 5 every 28 days     08/24/2019 - 12/03/2019 Chemotherapy    IP LEUKEMIA HIGH DOSE CYTARABINE CONSOLIDATION (3 G/M2) ON DAYS 1,2,3  cytarabine 3 g/m2 IV every 12 hours on days 1, 2, 3, every 28 days     01/19/2020 - 01/19/2020 Chemotherapy    BMT OP BUSULFAN TEST DOSE  Busulfan 0.8 mg/kg IV ONCE     01/26/2020 -  Chemotherapy    BMT IP MAC BUSULFAN / FLUDARABINE / rATG (MUD)   Busulfan IV Days -6 to -3  Fludarabine 40 mg/m2 IV Days -6 to -3  rATG 0.5 mg/kg IV Day -3, 1.5 mg/kg IV Day -2, 2.5 mg/kg IV Day -1       Physical exam:  BP 131/92  - Pulse 63  - Temp 36.8 ??C (98.2 ??F) (Oral)  - Resp 16  - Wt 68.3 kg (150 lb 8 oz)  - SpO2 99%  - BMI 23.57 kg/m??     80, Normal activity with effort; some signs or symptoms of disease (ECOG equivalent 1)    General: No acute distress noted. Resolving alopecia. Thin appearing.   Central Venous Access: Port accesssed.   ENT: Moist mucous membranes. Oropharhynx without lesions, erythema or exudate.   Cardiovascular: Pulse normal rate, regularity and rhythm. S1 and S2 normal, without any murmur, rub, or gallop.  Lungs: Clear to auscultation bilaterally, without wheezes/crackles/rhonchi. Good air movement.   Skin: Dry. No new lesions. No rash or abrasions. Bronzy skin color.   Psychiatry: Alert and oriented to person, place, and time.   Gastrointestinal/Abdomen: Normoactive bowel sounds, abdomen soft, non-tender   Musculoskeletal/Extremities: FROM throughout. Trace BLE edema ankles/shins.     Neurologic: CNII-XII intact. Normal strength and sensation throughout    Labs:  I reviewed all labs from today in Epic. See EMR for lab results.    Lab Results   Component Value Date    WBC  3.2 (L) 07/18/2020    HGB 10.0 (L) 07/18/2020    HCT 30.8 (L) 07/18/2020    PLT 102 (L) 07/18/2020     Lab Results   Component Value Date    NA 138 07/18/2020    K 3.9 07/18/2020    CL 107 07/18/2020    CO2 27.0 07/18/2020    BUN 21 07/18/2020    CREATININE 1.15 (H) 07/18/2020    GLU 151 07/18/2020    CALCIUM 9.4 07/18/2020    MG 1.8 07/18/2020    PHOS 4.1 02/22/2020     Lab Results   Component Value Date    BILITOT 0.4 07/18/2020    BILIDIR 0.30 02/21/2020    PROT 5.9 07/18/2020    ALBUMIN 3.5 07/18/2020    ALT 17 07/18/2020    AST 25 07/18/2020    ALKPHOS 66 07/18/2020    GGT 21 01/26/2020     Lab Results   Component Value Date    PT 14.1 (H) 02/22/2020    INR 1.21 02/22/2020    APTT 33.2 02/17/2020       Shipping working Merchandiser, 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  Has Day 72 BmBx was scheduled for 12/8, but now moved to 12/15 since he was later arriving today.    - 05/03/20: Bone marrow biopsy confirms ongoing CR1 with MRD-. 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     GVHD prophylaxis:??  Tacrolimus now being tapered as below    05/31/20: Plan to taper tacrolimus by 0.5 mg every 2 weeks:  1/19: 1 mg/0.5 mg  2/2:  0.5mg /0.5mg   2/16: 0.5 mg qPM.   2/25: Self increased back to 0.5 mg po bid as upper GI symptoms worsened. Continue at this dose, may need to hold for awhile. If no improvement will need EGD.     - Next taper when ready plan was for daily for 2 weeks then 0.5 mg every other day for 2 weeks then stop    2. Methotrexate??15 mg/m2 IVP on day +1 then 10 mg/m2 on days +3, +6 and +11  3. ATG per [facility] standard dosing was included  ??  Heme:??  Transfusion criteria:??1 unit of PRBCs for Hgb<7 and 1 unit of platelets for Plt <10K or bleeding.   -??No history of transfusion reactions.   - Plts decreased, but now slowly improving.  Possibly related to whichever medication caused edema or slow engraftment.     ID:??  Prophylaxis:  - Antiviral: Valtrex 500mg  daily   - Antifungal: None  - PJP: Begin Dapsone 100mg  PO daily (has Sulfa allergy) on 12/1.   - CMV D+/R+: Restarted Letermovir prophy on 12/1, 480mg  po daily, Continue through D+100. Tressia Miners has been stopped.     C.Diff:   - Mild symptoms, positive on 12/1  - Oral Vancomycin (12/2-12/11) completed 10 day course  - Repeat C.diff 12/15 was negative    Vesicular lesions:  - Initial AC fossa lesion 10/25, lesions were migratory and vesicular in a linear fashion  - Swab and  blood both negative for VZV or HSV.  Trial of treatment dose Valtex did not change rash.    - As of 11/16 lesions resolved and no further rash.   ??  Allergy:  - PCN allergy (hives) has tolerated Cefepime without issues    Seasonal Allergies:  - Flonase and Claritin daily prn.   ??  EBV:  - IgM Ab + 01/05/20 but viral load 01/05/20 not detected.   - Viral Load 12/8, not detected    *Full virals sent 11/9 given mild thrombocytopenia and rash this panel was negative.  ??  Renal:   AKI:   - 05/10/20: Scr 2.89 today. Held tacrolimus and started IVF. Patient was instructed to drink plenty of fluids, preferably more than 3L/day, if possible.  - 07/18/20: Cr 1.15    FEN:   Hypomagnesemia:  Off supplement, level 1.8 today.   ??  Hepatic:??  - No active issues.   - VOD prophylaxis with Actigall held since 11/17 due to swelling/rash.      Pulm: DLCO 62%  - Former smoker, quit 01/12/20. Using nicotine patch currently. Has chronic dry cough but PFTs were acceptable.   07/24/19: CT chest w/ 0.6cm RUL nodules, no clear etiology.     ** Discussed with Dr. Oswald Hillock prior to transplant, ok to move forward, no plans to repeat unless new findings.       Neuro/Pain:??  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   - Back pain today from pulled muscle: continue tylenol/ibuprophen. Flexeril prn Rx provided today.     Psychosocial:??  - Substance abuse (opioid history). Sober for 5 years.  - Followed by Frederik Schmidt in [facility], placed referral for [facility] while admitted, are not seeing inpatients, will follow- up with him post discharge.   - Insomnia: Continue Trazodone 50mg  nightly prn.     Bone health:  Dexa scan from 10/13 resulted and shows low bone density.   - Seen by pharmacy and discussed zometa and calcium supplemenation 10/22  - Zometa first dose on 03/23/20.  Will need to monitor for recurrence of swelling when we give second dose since it occurred right after 1st dose.    ** He has a dental appointment 12/9 but just for impressions for partial implant (it is a non-invasive dental procedure as the implants are clip on with a retainer, not surgically attached, so ok to proceed with Zometa)   ??  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   - Tacrolimus, holding taper for now with mild upper GI symptoms. Continue 0.5 mg po bid. Low threshold for upper EGD if symptoms persist or weight loss continues. No lower symptoms currently.   - Continue Zyprexa 5 mg nightly or can split 2.5 mg bid.    - RTC in 2 weeks with APP, requested next available with Dr. Oswald Hillock.     I personally spent 50 minutes face-to-face and non-face-to-face in the care of this patient, which includes all pre, intra, and post visit time on the date of service.      Future Appointments   Date Time Provider Department Center   08/01/2020  7:45 AM ONCBMT LABS [facility]   08/01/2020  8:15 AM ONCBMT APP B [facility]  09/20/2020  9:15 AM ONCBMT LABS [facility]   09/20/2020  9:45 AM Artelia Laroche, MD [facility]

## 2020-07-26 NOTE — Unmapped (Signed)
Bone Marrow Transplant Outpatient Clinical Social Work Progress Note:    SW received a message from patient requesting resources for Scholarships because he is interested in going back to school. Patient also inquired about the [employer] and reports he is coming to clinic on 08/01/2020 and was hoping to meet with this SW. SW encouraged patient to call this SW once he arrives and this SW will try to meet with him to complete the application. SW also sent him a Insurance underwriter for school via e-mail.    Rockwell Alexandria, MSW, LCSW  Bone Marrow Transplant Social Worker  [facility]  Office: [phone]

## 2020-07-27 MED ORDER — PROMETHAZINE 25 MG TABLET
ORAL_TABLET | Freq: Four times a day (QID) | ORAL | 1 refills | 0 days | PRN
Start: 2020-07-27 — End: ?

## 2020-08-01 ENCOUNTER — Ambulatory Visit: Admit: 2020-08-01 | Discharge: 2020-08-01 | Payer: PRIVATE HEALTH INSURANCE

## 2020-08-01 ENCOUNTER — Encounter: Admit: 2020-08-01 | Discharge: 2020-08-01 | Payer: PRIVATE HEALTH INSURANCE

## 2020-08-01 ENCOUNTER — Encounter: Admit: 2020-08-01 | Discharge: 2020-08-01 | Payer: PRIVATE HEALTH INSURANCE | Attending: Oncology | Primary: Oncology

## 2020-08-01 DIAGNOSIS — Z9484 Stem cells transplant status: Principal | ICD-10-CM

## 2020-08-01 DIAGNOSIS — C9201 Acute myeloblastic leukemia, in remission: Principal | ICD-10-CM

## 2020-08-01 DIAGNOSIS — R7989 Other specified abnormal findings of blood chemistry: Principal | ICD-10-CM

## 2020-08-01 DIAGNOSIS — G5793 Unspecified mononeuropathy of bilateral lower limbs: Principal | ICD-10-CM

## 2020-08-01 LAB — CBC W/ AUTO DIFF
BASOPHILS ABSOLUTE COUNT: 0 10*9/L (ref 0.0–0.1)
BASOPHILS RELATIVE PERCENT: 0.4 %
EOSINOPHILS ABSOLUTE COUNT: 0.1 10*9/L (ref 0.0–0.5)
EOSINOPHILS RELATIVE PERCENT: 1.9 %
HEMATOCRIT: 27.6 % — ABNORMAL LOW (ref 39.0–48.0)
HEMOGLOBIN: 9.6 g/dL — ABNORMAL LOW (ref 12.9–16.5)
LYMPHOCYTES ABSOLUTE COUNT: 1.5 10*9/L (ref 1.1–3.6)
LYMPHOCYTES RELATIVE PERCENT: 33.9 %
MEAN CORPUSCULAR HEMOGLOBIN CONC: 34.6 g/dL (ref 32.0–36.0)
MEAN CORPUSCULAR HEMOGLOBIN: 36.6 pg — ABNORMAL HIGH (ref 25.9–32.4)
MEAN CORPUSCULAR VOLUME: 105.6 fL — ABNORMAL HIGH (ref 77.6–95.7)
MEAN PLATELET VOLUME: 8.5 fL (ref 6.8–10.7)
MONOCYTES ABSOLUTE COUNT: 0.4 10*9/L (ref 0.3–0.8)
MONOCYTES RELATIVE PERCENT: 10.1 %
NEUTROPHILS ABSOLUTE COUNT: 2.4 10*9/L (ref 1.8–7.8)
NEUTROPHILS RELATIVE PERCENT: 53.7 %
PLATELET COUNT: 88 10*9/L — ABNORMAL LOW (ref 150–450)
RED BLOOD CELL COUNT: 2.62 10*12/L — ABNORMAL LOW (ref 4.26–5.60)
RED CELL DISTRIBUTION WIDTH: 15.2 % (ref 12.2–15.2)
WBC ADJUSTED: 4.5 10*9/L (ref 3.6–11.2)

## 2020-08-01 LAB — COMPREHENSIVE METABOLIC PANEL
ALBUMIN: 3.5 g/dL (ref 3.4–5.0)
ALKALINE PHOSPHATASE: 75 U/L (ref 46–116)
ALT (SGPT): 20 U/L (ref 10–49)
ANION GAP: 3 mmol/L — ABNORMAL LOW (ref 5–14)
AST (SGOT): 27 U/L (ref <=34)
BILIRUBIN TOTAL: 0.5 mg/dL (ref 0.3–1.2)
BLOOD UREA NITROGEN: 17 mg/dL (ref 9–23)
BUN / CREAT RATIO: 16
CALCIUM: 9.1 mg/dL (ref 8.7–10.4)
CHLORIDE: 111 mmol/L — ABNORMAL HIGH (ref 98–107)
CO2: 26 mmol/L (ref 20.0–31.0)
CREATININE: 1.06 mg/dL
EGFR CKD-EPI AA MALE: 90 mL/min/{1.73_m2} (ref >=60)
EGFR CKD-EPI NON-AA MALE: 78 mL/min/{1.73_m2} (ref >=60)
GLUCOSE RANDOM: 100 mg/dL (ref 70–179)
POTASSIUM: 3.9 mmol/L (ref 3.4–4.8)
PROTEIN TOTAL: 6 g/dL (ref 5.7–8.2)
SODIUM: 140 mmol/L (ref 135–145)

## 2020-08-01 LAB — MAGNESIUM: MAGNESIUM: 1.8 mg/dL (ref 1.6–2.6)

## 2020-08-01 MED ORDER — OLANZAPINE 5 MG TABLET
ORAL_TABLET | Freq: Every evening | ORAL | 2 refills | 30.00000 days
Start: 2020-08-01 — End: 2021-08-01

## 2020-08-01 MED ORDER — PREGABALIN 75 MG CAPSULE
ORAL_CAPSULE | Freq: Two times a day (BID) | ORAL | 1 refills | 23 days | Status: CP
Start: 2020-08-01 — End: ?

## 2020-08-01 MED ORDER — VALACYCLOVIR 500 MG TABLET
ORAL_TABLET | Freq: Every day | ORAL | 0 refills | 30 days | Status: CP
Start: 2020-08-01 — End: 2020-08-31

## 2020-08-01 MED ORDER — PROMETHAZINE 25 MG TABLET
ORAL_TABLET | Freq: Four times a day (QID) | ORAL | 1 refills | 12 days | PRN
Start: 2020-08-01 — End: 2020-08-31

## 2020-08-01 MED ORDER — TRAZODONE 50 MG TABLET
ORAL_TABLET | Freq: Every evening | ORAL | 3 refills | 30 days | Status: CP | PRN
Start: 2020-08-01 — End: 2020-08-31

## 2020-08-01 MED ADMIN — tixagevimab-cilgavimab 150 mg/1.5 mL- 150 mg/1.5 mL injection 3 mL: 3 mL | INTRAMUSCULAR | @ 13:00:00 | Stop: 2020-08-01

## 2020-08-01 MED ADMIN — heparin, porcine (PF) 100 unit/mL injection 500 Units: 500 [IU] | INTRAVENOUS | @ 12:00:00 | Stop: 2020-08-01

## 2020-08-01 NOTE — Unmapped (Signed)
Bone Marrow Transplant Outpatient Clinical Social Work Progress Note:    SW met with patient in clinic waiting room area to briefly introduce self and provide contact information for this SW for any questions/concerns moving forward. Patient states he forgot the [employer] form at home, but will send this SW the form via e-mail or next time he comes into clinic.     Rockwell Alexandria, MSW, LCSW  Bone Marrow Transplant Social Worker  [facility]  Office: [phone]

## 2020-08-01 NOTE — Unmapped (Signed)
Increase lyrica to 2 tabs twice a day.    All lab results last 24 hours:    Recent Results (from the past 24 hour(s))   Magnesium Level    Collection Time: 08/01/20  7:45 AM   Result Value Ref Range    Magnesium 1.8 1.6 - 2.6 mg/dL   Comprehensive Metabolic Panel    Collection Time: 08/01/20  7:45 AM   Result Value Ref Range    Sodium 140 135 - 145 mmol/L    Potassium 3.9 3.4 - 4.8 mmol/L    Chloride 111 (H) 98 - 107 mmol/L    Anion Gap 3 (L) 5 - 14 mmol/L    CO2 26.0 20.0 - 31.0 mmol/L    BUN 17 9 - 23 mg/dL    Creatinine 1.61 0.96 - 1.10 mg/dL    BUN/Creatinine Ratio 16     EGFR CKD-EPI Non-African American, Male 49 >=60 mL/min/1.82m2    EGFR CKD-EPI African American, Male 90 >=60 mL/min/1.56m2    Glucose 100 70 - 179 mg/dL    Calcium 9.1 8.7 - 04.5 mg/dL    Albumin 3.5 3.4 - 5.0 g/dL    Total Protein 6.0 5.7 - 8.2 g/dL    Total Bilirubin 0.5 0.3 - 1.2 mg/dL    AST 27 <=40 U/L    ALT 20 10 - 49 U/L    Alkaline Phosphatase 75 46 - 116 U/L   CBC w/ Differential    Collection Time: 08/01/20  7:45 AM   Result Value Ref Range    WBC 4.5 3.6 - 11.2 10*9/L    RBC 2.62 (L) 4.26 - 5.60 10*12/L    HGB 9.6 (L) 12.9 - 16.5 g/dL    HCT 98.1 (L) 19.1 - 48.0 %    MCV 105.6 (H) 77.6 - 95.7 fL    MCH 36.6 (H) 25.9 - 32.4 pg    MCHC 34.6 32.0 - 36.0 g/dL    RDW 47.8 29.5 - 62.1 %    MPV 8.5 6.8 - 10.7 fL    Platelet 88 (L) 150 - 450 10*9/L    Neutrophils % 53.7 %    Lymphocytes % 33.9 %    Monocytes % 10.1 %    Eosinophils % 1.9 %    Basophils % 0.4 %    Absolute Neutrophils 2.4 1.8 - 7.8 10*9/L    Absolute Lymphocytes 1.5 1.1 - 3.6 10*9/L    Absolute Monocytes 0.4 0.3 - 0.8 10*9/L    Absolute Eosinophils 0.1 0.0 - 0.5 10*9/L    Absolute Basophils 0.0 0.0 - 0.1 10*9/L    Macrocytosis Slight (A) Not Present

## 2020-08-01 NOTE — Unmapped (Cosign Needed)
Pharmacy - Post-transplant Vaccines Visit    Patient Name/ID: John Erickson  MRN: [id number]  DOB: April 26, 1961    Encounter Date: 08/01/2020    BMT Attending MD: Dr. Oswald Hillock    Patient/Caregiver Phone:  [phone] (home)     Interval History:    [name] is a 60 y.o. YO male with a h/o AML who is now day +181 s/p MAC with Flu/Bu/ATG f/b MMUD allogeneic stem cell transplant on 02/02/2020. he is presenting to clinic for his 6-month follow up visit and I am seeing him today to discuss initiation of his post-transplant vaccinations.    [name] presents to clinic today alone and is feeling well overall. He denies any sick symptoms or contacts in the last 2 weeks.     Vaccine Discussion and Recommendation:     I discussed the rationale for post-transplant vaccines and the goal of restoring immunity to vaccine-preventable diseases and infections that may have been lost following his stem cell transplant. We discussed each specific vaccine that he will be getting and the timeline/schedule for when they will be due.     The vaccine schedule will be as follows:     Vaccination 6 months 12 months 18 months 24 months Comments      Inactivated Influenza Vaccine (IIV) X       Should receive annually starting 6 months post-transplant      DTaP X X X   Administered as Pediarix triple vaccine      Hep B X X X   Administered as Pediarix triple vaccine   Haemophilus influenza type B  (Hib) X X X       Inactivated polio vaccine (IPV) X X X   Administered as Pediarix triple vaccine   Pneumoccocal conjugate vaccine 13-valent (PCV 13) X X X       Adjuvanted herpes zoster vaccine  (Shingrix) X  X        Pneumococcal polysaccharide vaccine 23-valent (PPSV23)       X     Measles, Mumps, Rubella (MMR)       X       Today, [name] received immunization education. [name] would like to reschedule his 38-month vaccine administration for one day in the next couple of weeks at his next visit.     I reviewed the difference between inactivated vaccines and the live attenuated vaccines and why they are given at the time points that they are, allowing for adequate immune reconstitution. I also reviewed that the timing for these vaccines line up with when he should be returning to the BMT clinic for his routine post-transplant f/u visits. Thus, he should plan to get all vaccines in the [facility] BMT clinic so that we can assure he is getting all necessary post-transplant vaccines.     We also discussed the side effects associated with vaccines including potential injection site reactions (pain, redness, swelling) or fevers. If these occur they are typically transient and can be managed symptomatically.     I completed a medication review and I discussed the need to continue to monitor his medications as some medications (including IV) may warrant a delay in vaccine administration. he will update [place] about any new oral medications or infusions that he has received at each visit so that we can adjust his vaccine schedule accordingly.     I will provide him with a vaccine sheet at the time of immunization to track administration of each of the required  post-transplant vaccines. While vaccine administration will be documented in his [facility] record, I have asked that he bring this vaccine sheet to his clinic appointments so that we can confirm what he has already received in the case that he does end up getting any vaccines at  an outside doctor's office or in a different [facility] clinic.     [name] verbalized understanding of the above.     **f/u:    Appointment requested for ~08/15/20; plan for 30-month vaccine administration at that time; then in 6 months and at that time he will be due for his 44-month vaccines.     I spent 15 minutes in direct patient care with this patient reviewing the information above.     Dallas Schimke, PharmD, CPP  BMT Clinical Pharmacist Practitioner

## 2020-08-01 NOTE — Unmapped (Unsigned)
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  Transplant was tolerated well, complicated by mucositis requiring PCA.    Interval History:   John Erickson is here today for routine follow up.  He has been waking up early around 2 AM.    Feet have a bit more neuropathy than before.      Last visit he hd some GI sx.  He increased tac dose and his sx were better in increased tac dose.    Appetite is increasing. He is eating ok.  He had chicken mini biscuits for breakfast; cheeseburger kids meal but not all of it; and for dinner he had salmon, rice and asparagus.  Continues to do light exercise. Continues with dance lessons.  Starting school in May at local community college to take classed in supply chain and logistics.    Has had low T in the past and wants to know how to have this evaluated.    No GVHD sx to report.     ROS:  A comprehensive ROS performed and is negative except for pertinent positives as listed above in interval history.     I reviewed and updated past medical, surgical, social, and family history as appropriate.     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,XY[1]    Variants of Known or Likely Clinical Significance  Gene Transcript  Predicted Protein  VAF (%)   CEBPA c.[id number] p.Ser251Argfs 41      Variants of Unknown Significance  Gene Transcript Predicted Protein VAF (%)   TET2 c.4946A>G p.Tyr1649Cys 49      -  FLT-3-ITD and FLT-3-TKD studies are negative    Pertinent Phenotypic data:    Disease-specific prognostic estimate:     Induction:   7+3+HD Dauno (C1D1 2/24)    Recovery Marrow:    Final Diagnosis   Date Value Ref Range Status   08/12/2019   Final    Bone marrow, right iliac, aspiration and biopsy  -  Normocellular bone marrow (60%) with trilineage hematopoiesis and 4% blasts by manual aspirate differential  -  Routine cytogenetic analysis is pending  -  Flow cytometric MRD analysis is pending    This electronic signature is attestation that the pathologist personally reviewed the submitted material(s) and the final diagnosis reflects that evaluation.            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           Molecular  Genetics: No results found for: MYELOIDMP       AML (acute myeloid leukemia) (CMS-HCC)   07/09/2019 Biopsy    Bone marrow, left iliac, aspiration and biopsy  -  Hypercellular bone marrow (95%) involved by acute myeloid leukemia (42% blasts by manual aspirate differential)   Abnormal Karyotype: 46,Y,t(X;8)(q26;q11.2)[19]/46,XY[1]  Variants of Known or Likely Clinical Significance  Gene Transcript  Predicted Protein  VAF (%)   CEBPA c.753_762del p.Ser251Argfs 41      Variants of Unknown Significance  Gene Transcript Predicted Protein VAF (%)   TET2 c.4946A>G p.Tyr1649Cys 49      -  FLT-3-ITD and FLT-3-TKD studies are negative     07/09/2019 Initial Diagnosis    AML (acute myeloid leukemia) (CMS-HCC)     07/14/2019 - 07/20/2019 Chemotherapy    IP LEUKEMIA 7+3 HIGH DOSE DAUNOrubicin  DAUNOrubicin 90 mg/m2 IV on Days 1, 2, 3  Cytarabine 100 mg/m2 CIVI on Days 1 to 7     07/27/2019 Biopsy Bone marrow, right iliac, aspiration and biopsy  -  Hypocellular bone marrow (<5%) with treatment effect, markedly reduced trilineage hematopoiesis, and 3% blasts by manual aspirate differential (see Comment).     08/24/2019 - 09/28/2019 Chemotherapy    IP LEUKEMIA HIGH DOSE CYTARABINE CONSOLIDATION (3 G/M2)  cytarabine 3 g/m2 every 12 hours on days 1, 3 and 5 every 28 days     08/24/2019 - 12/03/2019 Chemotherapy    IP LEUKEMIA HIGH DOSE CYTARABINE CONSOLIDATION (3 G/M2) ON DAYS 1,2,3  cytarabine 3 g/m2 IV every 12 hours on days 1, 2, 3, every 28 days     01/19/2020 - 01/19/2020 Chemotherapy    BMT OP BUSULFAN TEST DOSE  Busulfan 0.8 mg/kg IV ONCE     01/26/2020 -  Chemotherapy    BMT IP MAC BUSULFAN / FLUDARABINE / rATG (MUD)   Busulfan IV Days -6 to -3  Fludarabine 40 mg/m2 IV Days -6 to -3  rATG 0.5 mg/kg IV Day -3, 1.5 mg/kg IV Day -2, 2.5 mg/kg IV Day -1       Physical exam:  BP 117/80  - Pulse 67  - Temp 36.5 ??C (97.7 ??F) (Oral)  - Ht 170.2 cm (5' 7)  - Wt 70.4 kg (155 lb 4.8 oz)  - SpO2 95%  - BMI 24.32 kg/m??     80, Normal activity with effort; some signs or symptoms of disease (ECOG equivalent 1)    General: No acute distress noted.  Central Venous Access: Port accesssed.   ENT: Moist mucous membranes. Oropharhynx without lesions, erythema or exudate.   Cardiovascular: Pulse normal rate, regularity and rhythm. S1 and S2 normal, without any murmur, rub, or gallop.  Lungs: Clear to auscultation bilaterally, without wheezes/crackles/rhonchi. Good air movement.   Skin: Dry. No new lesions. No rash or abrasions. Bronzy skin color.   Psychiatry: Alert and oriented to person, place, and time.   Gastrointestinal/Abdomen: Normoactive bowel sounds, abdomen soft, non-tender   Musculoskeletal/Extremities: FROM throughout. Trace BLE edema ankles/shins.     Neurologic: CNII-XII intact. Normal strength and sensation throughout    Labs:  I reviewed all labs from today in Epic. See EMR for lab results.    Lab Results   Component Value Date    WBC 4.5 08/01/2020    HGB 9.6 (L) 08/01/2020    HCT 27.6 (L) 08/01/2020    PLT 88 (L) 08/01/2020     Lab Results   Component Value Date    NA 140 08/01/2020  K 3.9 08/01/2020    CL 111 (H) 08/01/2020    CO2 26.0 08/01/2020    BUN 17 08/01/2020    CREATININE 1.06 08/01/2020    GLU 100 08/01/2020    CALCIUM 9.1 08/01/2020    MG 1.8 08/01/2020    PHOS 4.1 02/22/2020     Lab Results   Component Value Date    BILITOT 0.5 08/01/2020    BILIDIR 0.30 02/21/2020    PROT 6.0 08/01/2020    ALBUMIN 3.5 08/01/2020    ALT 20 08/01/2020    AST 27 08/01/2020    ALKPHOS 75 08/01/2020    GGT 21 01/26/2020     Lab Results   Component Value Date    PT 14.1 (H) 02/22/2020    INR 1.21 02/22/2020    APTT 33.2 02/17/2020     Shipping working Merchandiser, 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  Has Day 90 BmBx was scheduled for 12/8, but now moved to 12/15 since he was later arriving today.    - 05/03/20: Bone marrow biopsy confirms ongoing CR1 with MRD-. 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     GVHD prophylaxis:??  Tacrolimus now being tapered as below    05/31/20: Plan to taper tacrolimus by 0.5 mg every 2 weeks:  1/19: 1 mg/0.5 mg  2/2:  0.5mg /0.5mg   2/16: 0.5 mg qPM.   2/25: Self increased back to 0.5 mg po bid as upper GI symptoms worsened. Continue at this dose, may need to hold for awhile. If no improvement will need EGD.   3/16: Continue with current dose and plan to taper again at next visit to 0.5 mg daily.  GI sx improved.     - Next taper when ready plan was for daily for 2 weeks then 0.5 mg every other day for 2 weeks then stop    2. Methotrexate??15 mg/m2 IVP on day +1 then 10 mg/m2 on days +3, +6 and +11  3. ATG per [facility] standard dosing was included  ??  Heme:??  Transfusion criteria:??1 unit of PRBCs for Hgb<7 and 1 unit of platelets for Plt <10K or bleeding.   -??No history of transfusion reactions.   - Plts decreased, but now slowly improving.  Possibly related to whichever medication caused edema or slow engraftment.     ID:??  Prophylaxis:  - Antiviral: Valtrex 500mg  daily   - Antifungal: None  - PJP: Begin Dapsone 100mg  PO daily (has Sulfa allergy) on 12/1.   - CMV D+/R+: Restarted Letermovir prophy on 12/1, 480mg  po daily, Continue through D+100. Tressia Miners has been stopped.     C.Diff:   - Mild symptoms, positive on 12/1  - Oral Vancomycin (12/2-12/11) completed 10 day course  - Repeat C.diff 12/15 was negative    Vesicular lesions:  - Initial AC fossa lesion 10/25, lesions were migratory and vesicular in a linear fashion  - Swab and blood both negative for VZV or HSV.  Trial of treatment dose Valtex did not change rash.    - As of  11/16 lesions resolved and no further rash.   ??  Allergy:  - PCN allergy (hives) has tolerated Cefepime without issues    Seasonal Allergies:  - Flonase and Claritin daily prn.   ??  EBV:  - IgM Ab + 01/05/20 but viral load 01/05/20 not detected.   - Viral Load 12/8, not detected    *Full virals sent 11/9 given mild thrombocytopenia and rash this panel was negative.  ??  Renal:   AKI:   - 05/10/20: Scr 2.89 today. Held tacrolimus and started IVF. Patient was instructed to drink plenty of fluids, preferably more than 3L/day, if possible.  - 08/01/20: Cr 1    FEN:   Hypomagnesemia:  Off supplement, level 1.8 today.   ??  Hepatic:??  - No active issues.   - VOD prophylaxis with Actigall held since 11/17 due to swelling/rash.      Pulm: DLCO 62%  - Former smoker, quit 01/12/20. Using nicotine patch currently. Has chronic dry cough but PFTs were acceptable.   07/24/19: CT chest w/ 0.6cm RUL nodules, no clear etiology. ** Discussed with Dr. Oswald Hillock prior to transplant, ok to move forward, no plans to repeat unless new findings.       Neuro/Pain:??  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   - Back pain today from pulled muscle: continue tylenol/ibuprophen. Flexeril prn Rx provided today.     Psychosocial:??  - Substance abuse (opioid history). Sober for 5 years.  - Followed by Frederik Schmidt in [facility], placed referral for [facility] while admitted, are not seeing inpatients, will follow- up with him post discharge.   - Insomnia: Continue Trazodone 50mg  nightly prn.     Bone health:  Dexa scan from 10/13 resulted and shows low bone density.   - Seen by pharmacy and discussed zometa and calcium supplemenation 10/22  - Zometa first dose on 03/23/20.  Will need to monitor for recurrence of swelling when we give second dose since it occurred right after 1st dose.    ** He has a dental appointment 12/9 but just for impressions for partial implant (it is a non-invasive dental procedure as the implants are clip on with a retainer, not surgically attached, so ok to proceed with Zometa)   ??  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   - Tacrolimus, holding taper for now with mild upper GI symptoms. Continue 0.5 mg po bid. Consider decreasing at next visit.  - Continue Zyprexa 2.5 mg nightly     - RTC in 2 weeks     I personally spent 40 minutes face-to-face and non-face-to-face in the care of this patient, which includes all pre, intra, and post visit time on the date of service.      Future Appointments   Date Time Provider Department Center   08/01/2020  9:30 AM ONCBMT NURSE [facility]   09/20/2020  9:15 AM ONCBMT LABS [facility]   09/20/2020  9:45 AM Artelia Laroche, MD [facility] mild upper GI symptoms. Continue 0.5 mg po bid. Low threshold for upper EGD if symptoms persist or weight loss continues. No lower symptoms currently.   - Continue Zyprexa 5 mg nightly or can split 2.5 mg bid.    -  RTC in 2 weeks with APP, requested next available with Dr. Oswald Hillock.     I personally spent 50 minutes face-to-face and non-face-to-face in the care of this patient, which includes all pre, intra, and post visit time on the date of service.      Future Appointments   Date Time Provider Department Center   08/01/2020  9:30 AM ONCBMT NURSE [facility]   09/20/2020  9:15 AM ONCBMT LABS [facility]   09/20/2020  9:45 AM Artelia Laroche, MD [facility]

## 2020-08-01 NOTE — Unmapped (Signed)
Patient arrived free of complaints. Tolerated injection well, patient refused to wait one hour observation in no acute distress. Patient states he did not have reaction the last injection Provider notified.

## 2020-08-02 DIAGNOSIS — Z9484 Stem cells transplant status: Principal | ICD-10-CM

## 2020-08-02 LAB — CMV DNA, QUANTITATIVE, PCR
CMV QUANT LOG10: 1.89 {Log_IU}/mL — ABNORMAL HIGH (ref <0.00)
CMV QUANT: 77 [IU]/mL — ABNORMAL HIGH (ref <0)

## 2020-08-02 LAB — EBV QUANTITATIVE PCR, BLOOD: EBV VIRAL LOAD RESULT: NOT DETECTED

## 2020-08-02 MED ORDER — TACROLIMUS 0.5 MG CAPSULE, IMMEDIATE-RELEASE
ORAL_CAPSULE | Freq: Two times a day (BID) | ORAL | 5 refills | 30.00000 days | Status: CP
Start: 2020-08-02 — End: ?
  Filled 2020-08-07: qty 60, 30d supply, fill #0

## 2020-08-02 NOTE — Unmapped (Signed)
[facility] Refill Coordination Note    Specialty Medication(s) to be Shipped:   Transplant: tacrolimus 0.5mg     Other medication(s) to be shipped: No additional medications requested for fill at this time     John Erickson, DOB: 03-18-1961  Phone: [phone] (home)       All above HIPAA information was verified with patient.     Was a Nurse, learning disability used for this call? No    Completed refill call assessment today to schedule patient's medication shipment from the [facility] [phone]).       Specialty medication(s) and dose(s) confirmed: Patient reports changes to the regimen as follows: 1 TWICE DAILY   Changes to medications: [name] reports no changes at this time.  Changes to insurance: No  Questions for the pharmacist: No    Confirmed patient received Welcome Packet with first shipment. The patient will receive a drug information handout for each medication shipped and additional FDA Medication Guides as required.       DISEASE/MEDICATION-SPECIFIC INFORMATION        N/A    SPECIALTY MEDICATION ADHERENCE     Medication Adherence    Patient reported X missed doses in the last month: 0  Specialty Medication: TACROLIMUS 0.5MG    Patient is on additional specialty medications: No  Informant: patient  Confirmed plan for next specialty medication refill: delivery by pharmacy  Refills needed for supportive medications: yes, ordered or provider notified          Refill Coordination    Has the Patients' Contact Information Changed: No  Is the Shipping Address Different: No           TACROLIMUS 0.5 mg: 8 days of medicine on hand         SHIPPING     Shipping address confirmed in Epic.     Delivery Scheduled: Yes, Expected medication delivery date: 3/22.  However, Rx request for refills was sent to the provider as there are none remaining.     Medication will be delivered via UPS to the prescription address in Epic WAM.    Jolene Schimke   [facility] Specialty Technician

## 2020-08-03 MED ORDER — DAPSONE 100 MG TABLET
ORAL_TABLET | Freq: Every day | ORAL | 5 refills | 30 days | Status: CP
Start: 2020-08-03 — End: 2021-01-30

## 2020-08-03 NOTE — Unmapped (Signed)
Clinical Assessment Needed For: Dose Change  Medication: Tacrolimus 0.5mg  capsule  Last Fill Date/Day Supply: 07/11/2020 / 30 days  Copay $0  Was previous dose already scheduled to fill: Yes    Notes to Pharmacist: Scheduled to fill 03/21. I've left in work request since it's the only med.

## 2020-08-10 MED ORDER — OLANZAPINE 5 MG TABLET
ORAL_TABLET | Freq: Every evening | ORAL | 0 refills | 30 days | Status: CP
Start: 2020-08-10 — End: ?

## 2020-08-17 ENCOUNTER — Encounter: Admit: 2020-08-17 | Discharge: 2020-08-17 | Payer: PRIVATE HEALTH INSURANCE

## 2020-08-17 ENCOUNTER — Ambulatory Visit
Admit: 2020-08-17 | Discharge: 2020-08-17 | Payer: PRIVATE HEALTH INSURANCE | Attending: Primary Care | Primary: Primary Care

## 2020-08-17 DIAGNOSIS — D61818 Other pancytopenia: Principal | ICD-10-CM

## 2020-08-17 DIAGNOSIS — C9201 Acute myeloblastic leukemia, in remission: Principal | ICD-10-CM

## 2020-08-17 DIAGNOSIS — Z9484 Stem cells transplant status: Principal | ICD-10-CM

## 2020-08-17 DIAGNOSIS — R7989 Other specified abnormal findings of blood chemistry: Principal | ICD-10-CM

## 2020-08-17 DIAGNOSIS — Z23 Encounter for immunization: Principal | ICD-10-CM

## 2020-08-17 DIAGNOSIS — N179 Acute kidney failure, unspecified: Principal | ICD-10-CM

## 2020-08-17 DIAGNOSIS — D84822 Immunocompromised state associated with stem cell transplant (CMS-HCC): Principal | ICD-10-CM

## 2020-08-17 DIAGNOSIS — G5793 Unspecified mononeuropathy of bilateral lower limbs: Principal | ICD-10-CM

## 2020-08-17 LAB — COMPREHENSIVE METABOLIC PANEL
ALBUMIN: 3.6 g/dL (ref 3.4–5.0)
ALKALINE PHOSPHATASE: 75 U/L (ref 46–116)
ALT (SGPT): 23 U/L (ref 10–49)
ANION GAP: 3 mmol/L — ABNORMAL LOW (ref 5–14)
AST (SGOT): 26 U/L (ref <=34)
BILIRUBIN TOTAL: 0.4 mg/dL (ref 0.3–1.2)
BLOOD UREA NITROGEN: 36 mg/dL — ABNORMAL HIGH (ref 9–23)
BUN / CREAT RATIO: 27
CALCIUM: 9.4 mg/dL (ref 8.7–10.4)
CHLORIDE: 108 mmol/L — ABNORMAL HIGH (ref 98–107)
CO2: 27 mmol/L (ref 20.0–31.0)
CREATININE: 1.31 mg/dL — ABNORMAL HIGH
EGFR CKD-EPI AA MALE: 69 mL/min/{1.73_m2} (ref >=60)
EGFR CKD-EPI NON-AA MALE: 60 mL/min/{1.73_m2} (ref >=60)
GLUCOSE RANDOM: 117 mg/dL (ref 70–179)
POTASSIUM: 4.5 mmol/L (ref 3.5–5.1)
PROTEIN TOTAL: 6.3 g/dL (ref 5.7–8.2)
SODIUM: 138 mmol/L (ref 135–145)

## 2020-08-17 LAB — FOLATE: FOLATE: 11.3 ng/mL (ref >=5.4)

## 2020-08-17 LAB — CBC W/ AUTO DIFF
BASOPHILS ABSOLUTE COUNT: 0 10*9/L (ref 0.0–0.1)
BASOPHILS RELATIVE PERCENT: 0.4 %
EOSINOPHILS ABSOLUTE COUNT: 0 10*9/L (ref 0.0–0.5)
EOSINOPHILS RELATIVE PERCENT: 0.6 %
HEMATOCRIT: 29.9 % — ABNORMAL LOW (ref 39.0–48.0)
HEMOGLOBIN: 10.5 g/dL — ABNORMAL LOW (ref 12.9–16.5)
LYMPHOCYTES ABSOLUTE COUNT: 1.7 10*9/L (ref 1.1–3.6)
LYMPHOCYTES RELATIVE PERCENT: 28.1 %
MEAN CORPUSCULAR HEMOGLOBIN CONC: 34.9 g/dL (ref 32.0–36.0)
MEAN CORPUSCULAR HEMOGLOBIN: 36.6 pg — ABNORMAL HIGH (ref 25.9–32.4)
MEAN CORPUSCULAR VOLUME: 104.7 fL — ABNORMAL HIGH (ref 77.6–95.7)
MEAN PLATELET VOLUME: 8.2 fL (ref 6.8–10.7)
MONOCYTES ABSOLUTE COUNT: 0.4 10*9/L (ref 0.3–0.8)
MONOCYTES RELATIVE PERCENT: 7.2 %
NEUTROPHILS ABSOLUTE COUNT: 3.8 10*9/L (ref 1.8–7.8)
NEUTROPHILS RELATIVE PERCENT: 63.7 %
PLATELET COUNT: 135 10*9/L — ABNORMAL LOW (ref 150–450)
RED BLOOD CELL COUNT: 2.86 10*12/L — ABNORMAL LOW (ref 4.26–5.60)
RED CELL DISTRIBUTION WIDTH: 15.4 % — ABNORMAL HIGH (ref 12.2–15.2)
WBC ADJUSTED: 6 10*9/L (ref 3.6–11.2)

## 2020-08-17 LAB — MAGNESIUM: MAGNESIUM: 1.8 mg/dL (ref 1.6–2.6)

## 2020-08-17 LAB — TESTOSTERONE: TESTOSTERONE TOTAL: 380 ng/dL

## 2020-08-17 MED ORDER — TACROLIMUS 0.5 MG CAPSULE, IMMEDIATE-RELEASE
ORAL_CAPSULE | Freq: Every day | ORAL | 5 refills | 60.00000 days
Start: 2020-08-17 — End: ?

## 2020-08-17 MED ORDER — PREGABALIN 75 MG CAPSULE
ORAL_CAPSULE | 1 refills | 0 days
Start: 2020-08-17 — End: ?

## 2020-08-17 MED ADMIN — heparin, porcine (PF) 100 unit/mL injection 500 Units: 500 [IU] | INTRAVENOUS | @ 12:00:00 | Stop: 2020-08-17

## 2020-08-17 NOTE — Unmapped (Addendum)
-   Decrease tacrolimus to 0.5 mg once daily    - Decrease Lyrica to 1 in the AM and keep 2 in PM    Blood counts look great today.    We are checking your folate and testosterone today    Your kidney function is worse, make sure you are drinking at least 2L of water and then the above med changes should also help.       6 month vaccines today:  Today you received your next set of immunizations which includes:  Haemophilus influenza B  Hepatitis B  Diphtheria/Tetanus/Pertussis  Inactivated polio  Prevnar-11  1st dose of Shingrix    Tylenol post vaccines is fine to take for pain or low grade temps    Next vaccines due at 1 year mark    -------------------------------------------------------------------------------  Return to clinic in 2 weeks to see one of the providers. You will receive a time when you check out today.    Lab Results   Component Value Date    WBC 6.0 08/17/2020    HGB 10.5 (L) 08/17/2020    HCT 29.9 (L) 08/17/2020    PLT 135 (L) 08/17/2020     Lab Results   Component Value Date    NA 138 08/17/2020    K 4.5 08/17/2020    CL 108 (H) 08/17/2020    CO2 27.0 08/17/2020    BUN 36 (H) 08/17/2020    CREATININE 1.31 (H) 08/17/2020    GLU 117 08/17/2020    CALCIUM 9.4 08/17/2020    MG 1.8 08/17/2020    PHOS 4.1 02/22/2020     Lab Results   Component Value Date    BILITOT 0.4 08/17/2020    BILIDIR 0.30 02/21/2020    PROT 6.3 08/17/2020    ALBUMIN 3.6 08/17/2020    ALT 23 08/17/2020    AST 26 08/17/2020    ALKPHOS 75 08/17/2020    GGT 21 01/26/2020     Lab Results   Component Value Date    INR 1.21 02/22/2020    APTT 33.2 02/17/2020       For prescription refills:   For refills, please check your medication bottles to see if you have additional refills left. If so, please call your pharmacy and follow the directions to request a refill. If you do not have any refills left, please make a request during your clinic visit or by submitting a request through MyChart or by calling [phone]. Please allow 24 hours if your request is made during the week or 48 hours if requests are made on the weekends or holidays.     --------------------------------------------------------------------------------------------------------------------  For appointments & questions Monday through Friday 8 AM-4:30 PM     Please call [phone] or Toll free [phone]    On Nights, Weekends and Holidays  Call [phone] and ask for the oncologist on call    Please visit PrivacyFever.cz, a resource created just for family members and caregivers.  This website lists support services, how and where to ask for help. It has tools to assist you as you help [place] care for your loved one.    [facility]  [address]  [place], [place] [address]  [email or web address]

## 2020-08-17 NOTE — Unmapped (Signed)
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  Transplant was tolerated well, complicated by mucositis requiring PCA.    Interval History:   John Erickson is here today for routine follow up. Have been tapering tacrolimus, current dose is 0.5 mg po bid. He has been on this dose since 07/14/20. He tried dropping to 0.5 mg daily, but had increased GI sx, so self increased. He has ben doing well on the 0.5 mg po bid dose. He feels GI symptoms improved. He does still have issues with some GI distress, but feels these issues preceded transplant. He is doing his best to work on nutrition. He is drinking 1 protein shake daily, in addition he usually eats a meat and a side for lunch and dinner. He is having more issues with carbohydrate foods swallowing and digesting those are harder. He is also doing 1 milkshake at night. He is not having any diarrhea. Continues to do light exercise. He was able to do exercises, he went hiking with his brother several weeks ago. He is staying busy. Starting school in May at local community college to take classed in supply chain and logistics.Has had low T in the past and wants to know how to have this evaluated, level is pending today.    No GVHD sx to report.     ROS:  A comprehensive ROS performed and is negative except for pertinent positives as listed above in interval history.     I reviewed and updated past medical, surgical, social, and family history as appropriate.     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,XY[1]    Variants of Known or Likely Clinical Significance  Gene Transcript  Predicted Protein  VAF (%)   CEBPA c.[id number] p.Ser251Argfs 41      Variants of Unknown Significance  Gene Transcript Predicted Protein VAF (%)   TET2 c.4946A>G p.Tyr1649Cys 49      -  FLT-3-ITD and FLT-3-TKD studies are negative    Pertinent Phenotypic data:    Disease-specific prognostic estimate:     Induction:   7+3+HD Dauno (C1D1 2/24)    Recovery Marrow:    Final Diagnosis   Date Value Ref Range Status   08/12/2019   Final    Bone marrow, right iliac, aspiration and biopsy  -  Normocellular bone marrow (60%) with trilineage hematopoiesis and 4% blasts by manual aspirate differential  -  Routine cytogenetic analysis is pending  -  Flow cytometric MRD analysis is pending    This electronic signature is attestation that the pathologist personally reviewed the submitted material(s) and the final diagnosis reflects that evaluation.            Genetics:   Karyotype/FISH:   RESULTS   Date Value  Ref Range Status   07/09/2019   Final    Abnormal Karyotype: 46,Y,t(X;8)(q26;q11.2)[19]/46,XY[1]    Normal FISH:  An interphase FISH assay shows no evidence of a rearrangement involving the KMT2A (MLL) gene region in the 200 nuclei scored (see below).           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,XY[1]  Variants of Known or Likely Clinical Significance  Gene Transcript  Predicted Protein  VAF (%)   CEBPA c.753_762del p.Ser251Argfs 41      Variants of Unknown Significance  Gene Transcript Predicted Protein VAF (%)   TET2 c.4946A>G p.Tyr1649Cys 49      -  FLT-3-ITD and FLT-3-TKD studies are negative     07/09/2019 Initial Diagnosis    AML (acute myeloid leukemia) (CMS-HCC)     07/14/2019 - 07/20/2019 Chemotherapy    IP LEUKEMIA 7+3 HIGH DOSE DAUNOrubicin  DAUNOrubicin 90 mg/m2 IV on Days 1, 2, 3  Cytarabine 100 mg/m2 CIVI on Days 1 to 7     07/27/2019 Biopsy    Bone marrow, right iliac, aspiration and biopsy  -  Hypocellular bone marrow (<5%) with treatment effect, markedly reduced trilineage hematopoiesis, and 3% blasts by manual aspirate differential (see Comment).     08/24/2019 - 09/28/2019 Chemotherapy    IP LEUKEMIA HIGH DOSE CYTARABINE CONSOLIDATION (3 G/M2)  cytarabine 3 g/m2 every 12 hours on days 1, 3 and 5 every 28 days     08/24/2019 - 12/03/2019 Chemotherapy    IP LEUKEMIA HIGH DOSE CYTARABINE CONSOLIDATION (3 G/M2) ON DAYS 1,2,3  cytarabine 3 g/m2 IV every 12 hours on days 1, 2, 3, every 28 days     01/19/2020 - 01/19/2020 Chemotherapy    BMT OP BUSULFAN TEST DOSE  Busulfan 0.8 mg/kg IV ONCE     01/26/2020 -  Chemotherapy    BMT IP MAC BUSULFAN / FLUDARABINE / rATG (MUD)   Busulfan IV Days -6 to -3  Fludarabine 40 mg/m2 IV Days -6 to -3  rATG 0.5 mg/kg IV Day -3, 1.5 mg/kg IV Day -2, 2.5 mg/kg IV Day -1       Physical exam:  BP 122/79  - Pulse 71  - Temp 36.6 ??C (97.9 ??F) (Oral)  - Resp 18  - Wt 67.3 kg (148 lb 6.4 oz)  - SpO2 96%  - BMI 23.24 kg/m??     80, Normal activity with effort; some signs or symptoms of disease (ECOG equivalent 1)    General: No acute distress noted.  Central Venous Access: Port accesssed.   ENT: Moist mucous membranes. Left eye with mild ptosis. Oropharhynx without lesions, erythema or exudate.   Cardiovascular: Pulse normal rate, regularity and rhythm. S1 and S2 normal, without any murmur, rub, or gallop.  Lungs: Clear to auscultation bilaterally, without wheezes/crackles/rhonchi. Good air movement.   Skin: Dry. No new lesions. No rash or abrasions. Bronzy skin color. Psychiatry: Alert and oriented to person, place, and time.   Gastrointestinal/Abdomen: Normoactive bowel sounds, abdomen soft, non-tender   Musculoskeletal/Extremities: FROM throughout. Trace BLE edema ankles/shins.     Neurologic: CNII-XII intact. Normal strength and sensation throughout    Labs:  I reviewed all labs from today in Epic. See EMR for  lab results.    Lab Results   Component Value Date    WBC 6.0 08/17/2020    HGB 10.5 (L) 08/17/2020    HCT 29.9 (L) 08/17/2020    PLT 135 (L) 08/17/2020     Lab Results   Component Value Date    NA 138 08/17/2020    K 4.5 08/17/2020    CL 108 (H) 08/17/2020    CO2 27.0 08/17/2020    BUN 36 (H) 08/17/2020    CREATININE 1.31 (H) 08/17/2020    GLU 117 08/17/2020    CALCIUM 9.4 08/17/2020    MG 1.8 08/17/2020    PHOS 4.1 02/22/2020     Lab Results   Component Value Date    BILITOT 0.4 08/17/2020    BILIDIR 0.30 02/21/2020    PROT 6.3 08/17/2020    ALBUMIN 3.6 08/17/2020    ALT 23 08/17/2020    AST 26 08/17/2020    ALKPHOS 75 08/17/2020    GGT 21 01/26/2020     Lab Results   Component Value Date    PT 14.1 (H) 02/22/2020    INR 1.21 02/22/2020    APTT 33.2 02/17/2020     Shipping working Merchandiser, 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  Has Day 27 BmBx was scheduled for 12/8, but now moved to 12/15 since he was later arriving today.    - 05/03/20: Bone marrow biopsy confirms ongoing CR1 with MRD-. 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     GVHD prophylaxis:??  Tacrolimus now being tapered as below    05/31/20: Plan to taper tacrolimus by 0.5 mg every 2 weeks:  1/19: 1 mg/0.5 mg  2/2: 0.5mg /0.5mg   2/16: 0.5 mg qPM.   2/25: Self increased back to 0.5 mg po bid as upper GI symptoms worsened. Continue at this dose, may need to hold for awhile. If no improvement will need EGD.   3/15: Continue with current dose and plan to taper again at next visit to 0.5 mg daily.  GI sx improved.   3/31: Decrease tacrolimus to 0.5 mg daily for 2 weeks.     - Next taper when ready plan was for daily for 2 weeks then 0.5 mg every other day for 2 weeks then stop    2. Methotrexate??15 mg/m2 IVP on day +1 then 10 mg/m2 on days +3, +6 and +11  3. ATG per [facility] standard dosing was included  ??  Heme:??  Transfusion criteria:??1 unit of PRBCs for Hgb<7 and 1 unit of platelets for Plt <10K or bleeding.   -??No history of transfusion reactions.   - Plts decreased, but now slowly improving.  Possibly related to whichever medication caused edema or slow engraftment.   - Mild anemia, folate level pending 3/31    ID:??  Prophylaxis:  - Antiviral: Valtrex 500mg  daily   - Antifungal: None  - PJP: Begin Dapsone 100mg  PO daily (has Sulfa allergy) on 12/1.   - CMV D+/R+: Restarted Letermovir prophy on 12/1, 480mg  po daily, Continue through D+100. Letermovir  has been stopped.     Vaccines:  -08/17/20: 6 month vaccines + #1 Shingrix given today  Has not received Covid vaccines, but did receive Evusheld 150 mg x 2 doses on 2/21 and 08/01/20    C.Diff:   - Mild symptoms, positive on 12/1  - Oral Vancomycin (12/2-12/11) completed 10 day course  - Repeat C.diff 12/15 was negative    Vesicular lesions:  - Initial AC fossa lesion 10/25, lesions were migratory and vesicular in a linear fashion  - Swab and blood both negative for VZV or HSV.  Trial of treatment dose Valtex did not change rash.    - As of 11/16 lesions resolved and no further rash.   ??  Allergy:  - PCN allergy (hives) has tolerated Cefepime without issues    Seasonal Allergies:  - Flonase and Claritin daily prn.   ??  EBV:  - IgM Ab + 01/05/20 but viral load 01/05/20 not detected.   - Viral Load 12/8, not detected    *Full virals sent 11/9 given mild thrombocytopenia and rash this panel was negative.  ??  Renal:   AKI:   - 05/10/20: Scr 2.89 today. Held tacrolimus and started IVF. Patient was instructed to drink plenty of fluids, preferably more than 3L/day, if possible.  - 08/01/20: Cr 1  -08/17/20: Cr 1.31, encouraged to push fluids and adjusting Lyrica as below    FEN:   Hypomagnesemia:  Off supplement, level 1.8 today.   ??  Hepatic:??  - No active issues.   - VOD prophylaxis with Actigall held since 11/17 due to swelling/rash.      Pulm: DLCO 62%  - Former smoker, quit 01/12/20. Using nicotine patch currently. Has chronic dry cough but PFTs were acceptable.   07/24/19: CT chest w/ 0.6cm RUL nodules, no clear etiology.     ** Discussed with Dr. Oswald Hillock prior to transplant, ok to move forward, no plans to repeat unless new findings.       Neuro/Pain:??  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 Flexeril prn    Psychosocial:??  - Substance abuse (opioid history). Sober for 5 years.  - Followed by Frederik Schmidt in [facility], placed referral for [facility] while admitted, are not seeing inpatients, will follow- up with him post discharge.   - Insomnia: Continue Trazodone 50mg  nightly prn.     Bone health:  Dexa scan from 10/13 resulted and shows low bone density.   - Seen by pharmacy and discussed zometa and calcium supplemenation 10/22  - Zometa first dose on 03/23/20.  Will need to monitor for recurrence of swelling when we give second dose since it occurred right after 1st dose.    ** He has a dental appointment 12/9 but just for impressions for partial implant (it is a non-invasive dental procedure as the implants are clip on with a retainer, not surgically attached, so ok to proceed with Zometa)     Endocrine:   Pt Requested to check Testosterone level, pending from 3/31  ??  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   - 6 month post transplant vaccines + Shingrix given  today  - AKI Creatinine up to 1.31 today, he admits to not being as focused on fluid intake. Also Lyrica dose was increased at last visit. We will go back down on morning dose to 75 mg and continue 150 mg in the evening when his PN symptoms are the worst.   - Tacrolimus tapering, decrease dose to 0.5 mg daily.   - Continue Zyprexa 5 mg nightly   - Follow up testosterone, folate and CMV levels from today    - RTC in 2 weeks     I personally spent 60 minutes face-to-face and non-face-to-face in the care of this patient, which includes all pre, intra, and post visit time on the date of service.      Future Appointments   Date Time Provider Department Center   08/29/2020  9:45 AM [facility]CBMT LABS [facility]   08/29/2020 10:15 AM ONCBMT APP B [facility]   09/12/2020  8:45 AM [facility]   09/12/2020  9:15 AM ONCBMT APP B [facility]   09/20/2020  9:15 AM [facility] LABS [facility]   09/20/2020  9:45 AM Artelia Laroche, MD [facility]

## 2020-08-18 LAB — CMV DNA, QUANTITATIVE, PCR
CMV QUANT: <50 [IU]/mL — ABNORMAL HIGH (ref <0)
CMV VIRAL LD: DETECTED — AB

## 2020-08-19 LAB — EBV QUANTITATIVE PCR, BLOOD: EBV VIRAL LOAD RESULT: NOT DETECTED

## 2020-08-29 ENCOUNTER — Encounter: Admit: 2020-08-29 | Discharge: 2020-08-30 | Payer: PRIVATE HEALTH INSURANCE

## 2020-08-29 DIAGNOSIS — Z9484 Stem cells transplant status: Principal | ICD-10-CM

## 2020-08-29 DIAGNOSIS — C9201 Acute myeloblastic leukemia, in remission: Principal | ICD-10-CM

## 2020-08-29 LAB — CBC W/ AUTO DIFF
BASOPHILS ABSOLUTE COUNT: 0 10*9/L (ref 0.0–0.1)
BASOPHILS RELATIVE PERCENT: 0.3 %
EOSINOPHILS ABSOLUTE COUNT: 0 10*9/L (ref 0.0–0.5)
EOSINOPHILS RELATIVE PERCENT: 0.4 %
HEMATOCRIT: 28.2 % — ABNORMAL LOW (ref 39.0–48.0)
HEMOGLOBIN: 9.5 g/dL — ABNORMAL LOW (ref 12.9–16.5)
LYMPHOCYTES ABSOLUTE COUNT: 1 10*9/L — ABNORMAL LOW (ref 1.1–3.6)
LYMPHOCYTES RELATIVE PERCENT: 16.6 %
MEAN CORPUSCULAR HEMOGLOBIN CONC: 33.8 g/dL (ref 32.0–36.0)
MEAN CORPUSCULAR HEMOGLOBIN: 35.9 pg — ABNORMAL HIGH (ref 25.9–32.4)
MEAN CORPUSCULAR VOLUME: 106.2 fL — ABNORMAL HIGH (ref 77.6–95.7)
MEAN PLATELET VOLUME: 8.4 fL (ref 6.8–10.7)
MONOCYTES ABSOLUTE COUNT: 0.4 10*9/L (ref 0.3–0.8)
MONOCYTES RELATIVE PERCENT: 6.5 %
NEUTROPHILS ABSOLUTE COUNT: 4.7 10*9/L (ref 1.8–7.8)
NEUTROPHILS RELATIVE PERCENT: 76.2 %
PLATELET COUNT: 122 10*9/L — ABNORMAL LOW (ref 150–450)
RED BLOOD CELL COUNT: 2.66 10*12/L — ABNORMAL LOW (ref 4.26–5.60)
RED CELL DISTRIBUTION WIDTH: 15.2 % (ref 12.2–15.2)
WBC ADJUSTED: 6.2 10*9/L (ref 3.6–11.2)

## 2020-08-29 LAB — COMPREHENSIVE METABOLIC PANEL
ALBUMIN: 3.5 g/dL (ref 3.4–5.0)
ALKALINE PHOSPHATASE: 69 U/L (ref 46–116)
ALT (SGPT): 18 U/L (ref 10–49)
ANION GAP: 3 mmol/L — ABNORMAL LOW (ref 5–14)
AST (SGOT): 25 U/L (ref <=34)
BILIRUBIN TOTAL: 0.3 mg/dL (ref 0.3–1.2)
BLOOD UREA NITROGEN: 30 mg/dL — ABNORMAL HIGH (ref 9–23)
BUN / CREAT RATIO: 28
CALCIUM: 9.1 mg/dL (ref 8.7–10.4)
CHLORIDE: 109 mmol/L — ABNORMAL HIGH (ref 98–107)
CO2: 26 mmol/L (ref 20.0–31.0)
CREATININE: 1.07 mg/dL
EGFR CKD-EPI AA MALE: 89 mL/min/{1.73_m2} (ref >=60)
EGFR CKD-EPI NON-AA MALE: 77 mL/min/{1.73_m2} (ref >=60)
GLUCOSE RANDOM: 112 mg/dL (ref 70–179)
POTASSIUM: 4.3 mmol/L (ref 3.4–4.8)
PROTEIN TOTAL: 6.2 g/dL (ref 5.7–8.2)
SODIUM: 138 mmol/L (ref 135–145)

## 2020-08-29 LAB — MAGNESIUM: MAGNESIUM: 1.7 mg/dL (ref 1.6–2.6)

## 2020-08-29 MED ORDER — FAMOTIDINE 20 MG TABLET
ORAL_TABLET | Freq: Two times a day (BID) | ORAL | 1 refills | 30 days | Status: CP
Start: 2020-08-29 — End: 2021-08-29

## 2020-08-29 MED ADMIN — heparin, porcine (PF) 100 unit/mL injection 500 Units: 500 [IU] | INTRAVENOUS | @ 14:00:00 | Stop: 2020-08-29

## 2020-08-29 NOTE — Unmapped (Signed)
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  Transplant was tolerated well, complicated by mucositis requiring PCA.    Interval History:     John Erickson is here today for routine follow up. Have been tapering tacrolimus, dropped himself back down to 0.5 mg po once daily since last appt on 3/31. He reports he has tolerated this well.     He reports he has good and bad days with his nausea. Nausea is his only GI symptom currently, and when he has a day where he is nauseated, he doesn't push it and barely eats anything. He attributes this partly to his inability to vomit since his fundoplication so he is wary of letting himself get too nauseated. He has nausea like this maybe 2 -3 x per week.  He continues to do his best on nutrition- he is drinking 1 protein shake daily, in addition to a meat and a side for lunch and dinner. He is having more issues with carbohydrate foods; swallowing and digesting those are harder. He is also doing 1 milkshake at night. He is not having any diarrhea. Continues to do light exercise. He is looking forward to starting school in May at local community college to take classes in supply chain and logistics.     John Erickson denies fever, chills, cough, SOB, chest pain, diarrhea, or any other infectious symptoms.    ROS:  A comprehensive ROS performed and is negative except for pertinent positives as listed above in interval history.     I reviewed and updated past medical, surgical, social, and family history as appropriate.     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,XY[1]    Variants of Known or Likely Clinical Significance  Gene Transcript  Predicted Protein  VAF (%)   CEBPA c.753_762del p.Ser251Argfs 41      Variants of Unknown Significance  Gene Transcript Predicted Protein VAF (%)   TET2 c.4946A>G p.Tyr1649Cys 49      -  FLT-3-ITD and FLT-3-TKD studies are negative    Pertinent Phenotypic data:    Disease-specific prognostic estimate:     Induction:   7+3+HD Dauno (C1D1 2/24)    Recovery Marrow:    Final Diagnosis   Date Value Ref Range Status   08/12/2019   Final    Bone marrow, right iliac, aspiration and biopsy  -  Normocellular bone marrow (60%) with trilineage hematopoiesis and 4% blasts by manual aspirate differential  -  Routine cytogenetic analysis is pending  -  Flow cytometric MRD analysis is pending    This electronic signature is attestation that the pathologist personally reviewed the submitted material(s) and the final diagnosis reflects that evaluation.            Genetics:  Karyotype/FISH:   RESULTS   Date Value Ref Range Status   07/09/2019   Final    Abnormal Karyotype: 46,Y,t(X;8)(q26;q11.2)[19]/46,XY[1]    Normal FISH:  An interphase FISH assay shows no evidence of a rearrangement involving the KMT2A (MLL) gene region in the 200 nuclei scored (see below).           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,XY[1]  Variants of Known or Likely Clinical Significance  Gene Transcript  Predicted Protein  VAF (%)   CEBPA c.753_762del p.Ser251Argfs 41      Variants of Unknown Significance  Gene Transcript Predicted Protein VAF (%)   TET2 c.4946A>G p.Tyr1649Cys 49      -  FLT-3-ITD and FLT-3-TKD studies are negative     07/09/2019 Initial Diagnosis    AML (acute myeloid leukemia) (CMS-HCC)     07/14/2019 - 07/20/2019 Chemotherapy    IP LEUKEMIA 7+3 HIGH DOSE DAUNOrubicin  DAUNOrubicin 90 mg/m2 IV on Days 1, 2, 3  Cytarabine 100 mg/m2 CIVI on Days 1 to 7     07/27/2019 Biopsy    Bone marrow, right iliac, aspiration and biopsy  -  Hypocellular bone marrow (<5%) with treatment effect, markedly reduced trilineage hematopoiesis, and 3% blasts by manual aspirate differential (see Comment).     08/24/2019 - 09/28/2019 Chemotherapy    IP LEUKEMIA HIGH DOSE CYTARABINE CONSOLIDATION (3 G/M2)  cytarabine 3 g/m2 every 12 hours on days 1, 3 and 5 every 28 days     08/24/2019 - 12/03/2019 Chemotherapy    IP LEUKEMIA HIGH DOSE CYTARABINE CONSOLIDATION (3 G/M2) ON DAYS 1,2,3  cytarabine 3 g/m2 IV every 12 hours on days 1, 2, 3, every 28 days     01/19/2020 - 01/19/2020 Chemotherapy    BMT OP BUSULFAN TEST DOSE  Busulfan 0.8 mg/kg IV ONCE     01/26/2020 -  Chemotherapy    BMT IP MAC BUSULFAN / FLUDARABINE / rATG (MUD)   Busulfan IV Days -6 to -3  Fludarabine 40 mg/m2 IV Days -6 to -3  rATG 0.5 mg/kg IV Day -3, 1.5 mg/kg IV Day -2, 2.5 mg/kg IV Day -1       Physical exam:  BP 110/69  - Pulse 74  - Temp 36.8 ??C (98.3 ??F) (Oral)  - Ht 172.7 cm (5' 8)  - Wt 68.9 kg (152 lb)  - SpO2 97%  - BMI 23.11 kg/m??     80, Normal activity with effort; some signs or symptoms of disease (ECOG equivalent 1)    General: No acute distress noted.  Central Venous Access: Port accessed-Site is clean dry and intact.   ENT: Moist mucous membranes. Left eye with mild ptosis. Oropharhynx without lesions, erythema or exudate.   Cardiovascular: Pulse normal rate, regularity and rhythm. S1 and S2 normal, without any murmur, rub, or gallop.  Lungs: Clear to auscultation bilaterally, without wheezes/crackles/rhonchi. Good air movement.   Skin: Dry. No new lesions. No rash or abrasions.  Psychiatry: Alert and oriented to person, place, and time.   Gastrointestinal/Abdomen: Normoactive bowel sounds, abdomen soft, non-tender   Musculoskeletal/Extremities: FROM and equal strength bilaterally.     Neurologic: Cranial nerves grossly intact. Grossly normal strength and sensation throughout.  Labs:  I reviewed all labs from today in Epic. See EMR for lab results.    Lab Results   Component Value Date    WBC 6.2 08/29/2020    HGB 9.5 (L) 08/29/2020    HCT 28.2 (L) 08/29/2020    PLT 122 (L) 08/29/2020     Lab Results   Component Value Date    NA 138 08/29/2020    K 4.3 08/29/2020    CL 109 (H) 08/29/2020    CO2 26.0 08/29/2020    BUN 30 (H) 08/29/2020    CREATININE 1.07 08/29/2020    GLU 112 08/29/2020    CALCIUM 9.1 08/29/2020    MG 1.7 08/29/2020    PHOS 4.1 02/22/2020     Lab Results   Component Value Date    BILITOT 0.3 08/29/2020    BILIDIR 0.30 02/21/2020    PROT 6.2 08/29/2020    ALBUMIN 3.5 08/29/2020    ALT 18 08/29/2020    AST 25 08/29/2020    ALKPHOS 69 08/29/2020    GGT 21 01/26/2020     Lab Results   Component Value Date    PT 14.1 (H) 02/22/2020    INR 1.21 02/22/2020    APTT 33.2 02/17/2020     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 Immediate 4 people.    Assessment and Plan    BMT:??AML, CR MRD Negative  HCT-CI (age adjusted) 7??(psych, severe pulmonary dysfunction, and age)   Conditioning:??MAC Bu/Flu/ATG  Donor:??9/10, ABO O+, CMV+  Engraftment:??Date of last granix injection: 02/19/20    Chimerism:   - 04/07/20: 94% donor in CD3+ and >95% donor in UF.  Has Day 42 BmBx was scheduled for 12/8, but now moved to 12/15 since he was later arriving today.    - 05/03/20: Bone marrow biopsy confirms ongoing CR1 with MRD-. 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     GVHD prophylaxis:??  Tacrolimus now being tapered as below    05/31/20: Plan to taper tacrolimus by 0.5 mg every 2 weeks:  1/19: 1 mg/0.5 mg  2/2:  0.5mg /0.5mg   2/16: 0.5 mg qPM.   4/12: Has bounced up to 0.5 mg BID and back down to 0.5 mg once daily a couple times now. Will try adding pepcid 20 mg BID to help with acid stomach that patient reports is contributing to or causing nausea. If no relief, will discuss EGD.    - Next taper when ready was for 0.5 mg every other day for 2 weeks then stop    2. Methotrexate??15 mg/m2 IVP on day +1 then 10 mg/m2 on days +3, +6 and +11  3. ATG per [facility] standard dosing was included  ??  Heme:??  Transfusion criteria:??1 unit of PRBCs for Hgb<7 and 1 unit of platelets for Plt <10K or bleeding.   -??No history of transfusion reactions.   - Plts decreased, but now slowly improving.  Possibly related to whichever medication caused edema or slow engraftment.   - Mild anemia, folate level pending 3/31    ID:??  Prophylaxis:  - Antiviral: Valtrex 500mg  daily   - Antifungal: None  - PJP: Begin Dapsone 100mg  PO daily (has Sulfa allergy) on 12/1.   - CMV D+/R+: Restarted Letermovir prophy on 12/1, 480mg  po daily, Continue through D+100. Tressia Miners has been stopped.     Vaccines:  -08/17/20: 6 month vaccines + #  1 Shingrix given today  Has not received Covid vaccines, but did receive Evusheld 150 mg x 2 doses on 2/21 and 08/01/20    C.Diff:   - Mild symptoms, positive on 12/1  - Oral Vancomycin (12/2-12/11) completed 10 day course  - Repeat C.diff 12/15 was negative    Vesicular lesions:  - Initial AC fossa lesion 10/25, lesions were migratory and vesicular in a linear fashion  - Swab and blood both negative for VZV or HSV. Trial of treatment dose Valtrex did not change rash.    - As of 11/16 lesions resolved and no further rash.   ??  Allergy:  - PCN allergy (hives) has tolerated Cefepime without issues    Seasonal Allergies:  - Flonase and Claritin daily prn.   ??  EBV:  - IgM Ab + 01/05/20 but viral load 01/05/20 not detected.   - Viral Load 12/8, not detected    *Full virals sent 11/9 given mild thrombocytopenia and rash this panel was negative.  ??  Renal:   AKI:   - 05/10/20: Scr 2.89 today. Held tacrolimus and started IVF. Patient was instructed to drink plenty of fluids, preferably more than 3L/day, if possible.  - 08/01/20: Cr 1  - 08/17/20: Cr 1.31, encouraged to push fluids and adjusting Lyrica as below  - 08/29/20: Pt reports not sure if he's drinking enough. However, Cr down to 1.07.    FEN:   Hypomagnesemia:  Off supplement, level 1.7 today 4/12.   ??  Hepatic:??  - No active issues.   - VOD prophylaxis with Actigall held since 11/17 due to swelling/rash.      Pulm: DLCO 62%  - Former smoker, quit 01/12/20. Using nicotine patch currently. Has chronic dry cough but PFTs were acceptable.   07/24/19: CT chest w/ 0.6cm RUL nodules, no clear etiology.     ** Discussed with Dr. Oswald Hillock prior to transplant, ok to move forward, no plans to repeat unless new findings.       Neuro/Pain:??  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 Flexeril prn    Psychosocial:??  - Substance abuse (opioid history). Sober for 5 years.  - Followed by Frederik Schmidt in [facility], placed referral for [facility] while admitted, are not seeing inpatients, will follow- up with him post discharge.   - Insomnia: Continue Trazodone 50mg  nightly prn.     Bone health:  Dexa scan from 10/13 resulted and shows low bone density.   - Seen by pharmacy and discussed zometa and calcium supplemenation 10/22  - Zometa first dose on 03/23/20.  Will need to monitor for recurrence of swelling when we give second dose since it occurred right after 1st dose.    Endocrine:   Pt Requested to check Testosterone level on 3/31, resulted normal at 380.  ??  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   - Cont lyrica morning dose to 75 mg and continue 150 mg in the evening when his PN symptoms are the worst.   - Tacrolimus tapering, continue dose to 0.5 mg daily. Adding pepcid 20 mg BID to treat acid stomach with goal to  stop tacrolimus as soon as possible.  - Continue Zyprexa 5 mg nightly   - RTC in 2 weeks    Kearney Hard, BSN, RN, OCN  AGPCNP Student, Oncology [employer]  [facility]    I have seen and assessed this patient with Tedra Senegal NP student. I agree with the plan as documented above.        Westley Hummer ANP-BC, AOCNP  Bone Marrow Transplant Nurse Practitioner    I personally spent 30 minutes face-to-face and non-face-to-face in the care of this patient, which includes all pre, intra, and post visit time on the date of service.    Future Appointments   Date Time Provider Department Center   09/12/2020  8:45 AM ONCBMT LABS [facility]   09/12/2020  9:15 AM ONCBMT APP B [facility]   09/20/2020  9:15 AM ONCBMT LABS [facility]   09/20/2020  9:45 AM Artelia Laroche, MD [facility]

## 2020-08-29 NOTE — Unmapped (Signed)
We're going to have you start pepcid 20mg  twice a day and see if the acid control helps with your nausea. Pay attention to how many bad nausea days you have and we can reassess at your next appointment.    Keep up the good work!      Lab Results   Component Value Date    WBC 6.2 08/29/2020    HGB 9.5 (L) 08/29/2020    HCT 28.2 (L) 08/29/2020    PLT 122 (L) 08/29/2020       Lab Results   Component Value Date    NA 138 08/29/2020    K 4.3 08/29/2020    CL 109 (H) 08/29/2020    CO2 26.0 08/29/2020    BUN 30 (H) 08/29/2020    CREATININE 1.07 08/29/2020    GLU 112 08/29/2020    CALCIUM 9.1 08/29/2020    MG 1.7 08/29/2020    PHOS 4.1 02/22/2020       Lab Results   Component Value Date    BILITOT 0.3 08/29/2020    BILIDIR 0.30 02/21/2020    PROT 6.2 08/29/2020    ALBUMIN 3.5 08/29/2020    ALT 18 08/29/2020    AST 25 08/29/2020    ALKPHOS 69 08/29/2020    GGT 21 01/26/2020       Lab Results   Component Value Date    PT 14.1 (H) 02/22/2020    INR 1.21 02/22/2020    APTT 33.2 02/17/2020

## 2020-08-30 LAB — EBV QUANTITATIVE PCR, BLOOD: EBV VIRAL LOAD RESULT: NOT DETECTED

## 2020-08-30 NOTE — Unmapped (Signed)
[facility] Clinical Assessment & Refill Coordination Note    Rolly Pancake, DOB: 05/28/1960  Phone: [phone] (home)     All above HIPAA information was verified with patient.     Was a Nurse, learning disability used for this call? No    Specialty Medication(s):   Transplant: tacrolimus 0.5mg      Current Outpatient Medications   Medication Sig Dispense Refill   ??? calcium carbonate (OS-CAL) 1,500 mg (600 mg elem calcium) tablet Take 1 tablet by mouth Two (2) times a day.     ??? cholecalciferol, vitamin D3-25 mcg, 1,000 unit,, (VITAMIN D3) 25 mcg (1,000 unit) capsule Take 25 mcg by mouth daily.      ??? dapsone 100 MG tablet Take 1 tablet (100 mg total) by mouth daily. 30 tablet 5   ??? famotidine (PEPCID) 20 MG tablet Take 1 tablet (20 mg total) by mouth Two (2) times a day. 60 tablet 1   ??? loratadine (CLARITIN) 10 mg tablet Take 1 tablet (10 mg total) by mouth daily. 30 tablet 2   ??? OLANZapine (ZYPREXA) 5 MG tablet Take 1 tablet (5 mg total) by mouth nightly. 30 tablet 0   ??? pregabalin (LYRICA) 75 MG capsule 1 tablet in the morning and 2 tablet in the evening 90 capsule 1   ??? tacrolimus (PROGRAF) 0.5 MG capsule Take 1 capsule (0.5 mg total) by mouth daily. 60 capsule 5   ??? traZODone (DESYREL) 50 MG tablet Take 2 tablets (100 mg total) by mouth nightly as needed for sleep (insomnia). 60 tablet 3   ??? valACYclovir (VALTREX) 500 MG tablet Take 1 tablet (500 mg total) by mouth daily. 30 tablet 0     No current facility-administered medications for this visit.        Changes to medications: [name] reports starting the following medications: Pepcid 20mg     Allergies   Allergen Reactions   ??? Penicillins Hives   ??? Sulfa (Sulfonamide Antibiotics) Other (See Comments) and Rash     Unknown    Other reaction(s): Unknown       Changes to allergies: No    SPECIALTY MEDICATION ADHERENCE     Tacrolimus 0.5 mg: 30 days of medicine on hand       Medication Adherence    Patient reported X missed doses in the last month: 0  Specialty Medication: Tacrolimus 0.5mg   Informant: patient  Confirmed plan for next specialty medication refill: delivery by pharmacy  Refills needed for supportive medications: not needed          Specialty medication(s) dose(s) confirmed: Regimen is correct and unchanged.     Are there any concerns with adherence? No    Adherence counseling provided? Not needed    CLINICAL MANAGEMENT AND INTERVENTION      Clinical Benefit Assessment:    Do you feel the medicine is effective or helping your condition? Yes    Clinical Benefit counseling provided? Not needed    Adverse Effects Assessment:    Are you experiencing any side effects? Yes, patient reports experiencing stomach acid. Side effect counseling provided: started Pepcid yesterday (04/12)    Are you experiencing difficulty administering your medicine? No    Quality of Life Assessment:    How many days over the past month did your condition/medication  keep you from your normal activities? For example, brushing your teeth or getting up in the morning. Patient declined to answer    Have you discussed this with your provider?  Not needed    Acute Infection Status:    Acute infections noted within Epic:  No active infections  Patient reported infection: None    Therapy Appropriateness:    Is therapy appropriate? Yes, therapy is appropriate and should be continued    DISEASE/MEDICATION-SPECIFIC INFORMATION      N/A    PATIENT SPECIFIC NEEDS     - Does the patient have any physical, cognitive, or cultural barriers? No    - Is the patient high risk? No    - Does the patient require a Care Management Plan? No     - Does the patient require physician intervention or other additional services (i.e. nutrition, smoking cessation, social work)? No      SHIPPING     Specialty Medication(s) to be Shipped:   Transplant: n/a    Other medication(s) to be shipped: No additional medications requested for fill at this time     Changes to insurance: No    Delivery Scheduled: Patient declined refill at this time due to has one month of capsules due to taper down.     Medication will be delivered via Next Day Courier to the confirmed prescription address in [facility].    The patient will receive a drug information handout for each medication shipped and additional FDA Medication Guides as required.  Verified that patient has previously received a Conservation officer, historic buildings and a Surveyor, mining.    All of the patient's questions and concerns have been addressed.    Xzaria Teo Vangie Bicker   [facility] Specialty Pharmacist

## 2020-08-31 LAB — CMV DNA, QUANTITATIVE, PCR: CMV VIRAL LD: NOT DETECTED

## 2020-09-08 ENCOUNTER — Encounter: Admit: 2020-09-08 | Discharge: 2020-09-09 | Payer: PRIVATE HEALTH INSURANCE

## 2020-09-08 DIAGNOSIS — R7989 Other specified abnormal findings of blood chemistry: Principal | ICD-10-CM

## 2020-09-08 DIAGNOSIS — R5382 Chronic fatigue, unspecified: Principal | ICD-10-CM

## 2020-09-08 MED ORDER — OLANZAPINE 5 MG TABLET
ORAL_TABLET | Freq: Every evening | ORAL | 0 refills | 30.00000 days | Status: CP
Start: 2020-09-08 — End: ?

## 2020-09-08 NOTE — Unmapped (Signed)
I placed the referral to Endocrinology. Here is the number: [phone]   Call this Monday and see what the time frame will be. If it is too far out, let me know if I need to make an external referral so you can see you Urologist. Or we can try to get you to see our Urology team, but I think Endo will be the best bet.

## 2020-09-08 NOTE — Unmapped (Signed)
[facility] at [facility]       Type of visit:  face to face    Reason for visit: follow up    Questions / Concerns that need to be addressed: testerone     General Consent to Treat (GCT) for non-epic video visits only: Verbal consent      Screening BP- 119/66-75               Allergies reviewed: Yes    Medication reviewed: Yes  Pended refills? No        HCDM reviewed and updated in Epic:    We are working to make sure all of our patients??? wishes are updated in Epic and part of that is documenting a Environmental health practitioner for each patient  A Health Care Decision John Erickson is someone you choose who can make health care decisions for you if you are not able ??? who would you most want to do this for you????  is already up to date.            COVID-19 Vaccine Summary  Which COVID-19 Vaccine was administered  Type:  Dates Given:          Date last mAB infusion:  08/01/2020              Immunization History   Administered Date(s) Administered   ??? DTaP / Hep B / IPV (Pediarix) 08/17/2020   ??? HiB-PRP-T 08/17/2020   ??? Influenza Virus Vaccine, unspecified formulation 03/23/2015, 03/11/2019   ??? Pneumococcal Conjugate 13-Valent 08/17/2020   ??? SHINGRIX-ZOSTER VACCINE (HZV), RECOMBINANT,SUB-UNIT,ADJUVANTED IM 08/17/2020       __________________________________________________________________________________________    SCREENINGS COMPLETED IN FLOWSHEETS    HARK Screening       AUDIT       PHQ2       PHQ9          P4 Suicidality Screener                GAD7       COPD Assessment       Falls Risk       .imcres

## 2020-09-08 NOTE — Unmapped (Signed)
Internal Medicine Clinic Visit    Reason for visit: Fatigue, c/f low testosterone    A/P:       1. Chronic Fatigue, ED, Decreased Libido - C/f Symptomatic Low Testosterone  He reported that he has had symptomatic low testosterone at these levels in the past. He used to get treatment from his Urologist, but this provider is now out of network. He would like to start supplementation, however I was cautious since his levels were in the normal range. We discussed referral to Endocrine, and he was agreeable with this plan. I want to be extra careful given his transplant status.     Return if symptoms worsen or fail to improve.    Staffed with Dr. Thomasene Mohair, discussed    __________________________________________________________    HPI:    He has hx of low T. Reported that his urologist (Dr. Durene Fruits at [facility]) wanted his testosterone levels 510-550. Most recent testosterone was 380. His BMT team was not worried and suggested he come to his PCP.     He is experiencing all the previous symptoms he had with low T: fatigue, weight loss, decrease muscle mass, mood swing, decreased libido, ED. All these symptoms improved with testosterone treatments. This has been going on for at least 6 weeks. He wakes up feeling ok, but after 10-11am he drops off. He is discouraged that he is lethargic all the time.     In the past has used creams and subcutaneous pellets prescribed by his urologist.   __________________________________________________________    Problem List:  Patient Active Problem List   Diagnosis   ??? Pancytopenia (CMS-HCC)   ??? Tobacco use disorder   ??? History of opioid abuse (CMS-HCC)   ??? AML (acute myeloid leukemia) (CMS-HCC)   ??? Anxiety   ??? Depression   ??? History of allogeneic stem cell transplant (CMS-HCC)   ??? Osteopenia determined by x-ray   ??? Immunization due       Medications:  Reviewed in EPIC  __________________________________________________________    Physical Exam:   Vital Signs:  Vitals: 09/08/20 1616   BP: 119/66   Pulse: 75   Temp: 36.2 ??C   TempSrc: Temporal   SpO2: 93%   Weight: 67.9 kg (149 lb 12.8 oz)   Height: 170.2 cm (5' 7)          General Appearance: NAD, sitting up in chair comfortably  Resp: no increased WOB, able to speak in full sentences, no use of accessory muscles, no cough or audible wheeze  Skin: No obvious rashes or lesions.  Psych: No agitation or anxiety. Mood appropriate.  Neuro: AOAx3. Able to engage in conversation. Face symmetric. Normal gait. Moves all extremities spontaneously.        Medication adherence and barriers to the treatment plan have been addressed. Opportunities to optimize healthy behaviors have been discussed. Patient / caregiver voiced understanding.

## 2020-09-13 DIAGNOSIS — G5793 Unspecified mononeuropathy of bilateral lower limbs: Principal | ICD-10-CM

## 2020-09-13 MED ORDER — PREGABALIN 75 MG CAPSULE
ORAL_CAPSULE | 1 refills | 0 days | Status: CN
Start: 2020-09-13 — End: ?

## 2020-09-15 DIAGNOSIS — G5793 Unspecified mononeuropathy of bilateral lower limbs: Principal | ICD-10-CM

## 2020-09-15 DIAGNOSIS — Z9484 Stem cells transplant status: Principal | ICD-10-CM

## 2020-09-15 MED ORDER — TACROLIMUS 0.5 MG CAPSULE, IMMEDIATE-RELEASE
ORAL_CAPSULE | Freq: Every day | ORAL | 5 refills | 30 days | Status: CP
Start: 2020-09-15 — End: ?
  Filled 2020-09-21: qty 30, 30d supply, fill #0

## 2020-09-15 MED ORDER — OLANZAPINE 5 MG TABLET
ORAL_TABLET | 0 refills | 0 days
Start: 2020-09-15 — End: ?

## 2020-09-15 MED ORDER — PREGABALIN 75 MG CAPSULE
ORAL_CAPSULE | 1 refills | 0.00000 days | Status: CP
Start: 2020-09-15 — End: ?

## 2020-09-15 NOTE — Unmapped (Signed)
[facility] Refill Coordination Note    Specialty Medication(s) to be Shipped:   Transplant: tacrolimus 0.5mg     Other medication(s) to be shipped: No additional medications requested for fill at this time     John Erickson, DOB: [birth year]  Phone: [phone] (home)       All above HIPAA information was verified with patient.     Was a Nurse, learning disability used for this call? No    Completed refill call assessment today to schedule patient's medication shipment from the [facility] [phone]).  All relevant notes have been reviewed.     Specialty medication(s) and dose(s) confirmed: Regimen is correct and unchanged.   Changes to medications: John Erickson reports no changes at this time.  Changes to insurance: No  New side effects reported not previously addressed with a pharmacist or physician: None reported  Questions for the pharmacist: No    Confirmed patient received a Conservation officer, historic buildings and a Surveyor, mining with first shipment. The patient will receive a drug information handout for each medication shipped and additional FDA Medication Guides as required.       DISEASE/MEDICATION-SPECIFIC INFORMATION        N/A    SPECIALTY MEDICATION ADHERENCE     Medication Adherence    Patient reported X missed doses in the last month: 0  Specialty Medication: tacrolimus 0.5 mg   Patient is on additional specialty medications: No  Informant: patient  Confirmed plan for next specialty medication refill: delivery by pharmacy  Refills needed for supportive medications: yes, ordered or provider notified          Refill Coordination    Has the Patients' Contact Information Changed: No  Is the Shipping Address Different: No         Were doses missed due to medication being on hold? No    tacrolimus 0.5 mg: 21 days of medicine on hand       REFERRAL TO PHARMACIST     Referral to the pharmacist: Not needed      [facility]     Shipping address confirmed in Epic.     Delivery Scheduled: Yes, Expected medication delivery date: 5/6.  However, Rx request for refills was sent to the provider as there are none remaining.     Medication will be delivered via Next Day Courier to the prescription address in Epic WAM.    Jolene Schimke   [facility] Specialty Technician

## 2020-09-15 NOTE — Unmapped (Signed)
Clinical Assessment Needed For: Dose Change  Medication: Tacrolimus 0.5mg  capsule  Last Fill Date/Day Supply: 08/07/2020 / 30 days  Copay $0  Was previous dose already scheduled to fill: Yes    Notes to Pharmacist: Scheduled to fill 05/05. Leaving in work request. Dose was changed 03/31, so patient is probably already aware.

## 2020-09-18 NOTE — Unmapped (Signed)
[facility] Pharmacist Intervention    Type of intervention: Dose change    Medication: tacrolimus 0.5 mg once daily    Problem: [facility] received new prescription for tacrolimus 0.5 mg caps with different dose than previous prescription.    Intervention: Per Epic notes, patient has been on tacrolimus 0.5 mg once daily since 08/17/20.  Confirmed directions at appt on 4/12 with Westley Hummer.    Follow up needed: not at this time.  Prescription is scheduled for delivery on 09/22/20    Approximate time spent: 5 minutes    Cerena Baine [facility] Specialty Pharmacist

## 2020-09-20 ENCOUNTER — Encounter: Admit: 2020-09-20 | Discharge: 2020-09-21 | Payer: PRIVATE HEALTH INSURANCE

## 2020-09-20 ENCOUNTER — Ambulatory Visit: Admit: 2020-09-20 | Discharge: 2020-09-21 | Payer: PRIVATE HEALTH INSURANCE

## 2020-09-20 DIAGNOSIS — C9201 Acute myeloblastic leukemia, in remission: Principal | ICD-10-CM

## 2020-09-20 DIAGNOSIS — Z9484 Stem cells transplant status: Principal | ICD-10-CM

## 2020-09-20 LAB — COMPREHENSIVE METABOLIC PANEL
ALBUMIN: 3.7 g/dL (ref 3.4–5.0)
ALKALINE PHOSPHATASE: 57 U/L (ref 46–116)
ALT (SGPT): 20 U/L (ref 10–49)
ANION GAP: 5 mmol/L (ref 5–14)
AST (SGOT): 26 U/L (ref <=34)
BILIRUBIN TOTAL: 0.4 mg/dL (ref 0.3–1.2)
BLOOD UREA NITROGEN: 27 mg/dL — ABNORMAL HIGH (ref 9–23)
BUN / CREAT RATIO: 22
CALCIUM: 9.4 mg/dL (ref 8.7–10.4)
CHLORIDE: 107 mmol/L (ref 98–107)
CO2: 26 mmol/L (ref 20.0–31.0)
CREATININE: 1.25 mg/dL — ABNORMAL HIGH
EGFR CKD-EPI AA MALE: 73 mL/min/{1.73_m2} (ref >=60)
EGFR CKD-EPI NON-AA MALE: 64 mL/min/{1.73_m2} (ref >=60)
GLUCOSE RANDOM: 97 mg/dL (ref 70–179)
POTASSIUM: 4.3 mmol/L (ref 3.4–4.8)
PROTEIN TOTAL: 6.5 g/dL (ref 5.7–8.2)
SODIUM: 138 mmol/L (ref 135–145)

## 2020-09-20 LAB — CBC W/ AUTO DIFF
BASOPHILS ABSOLUTE COUNT: 0 10*9/L (ref 0.0–0.1)
BASOPHILS RELATIVE PERCENT: 0.3 %
EOSINOPHILS ABSOLUTE COUNT: 0 10*9/L (ref 0.0–0.5)
EOSINOPHILS RELATIVE PERCENT: 0.6 %
HEMATOCRIT: 30.3 % — ABNORMAL LOW (ref 39.0–48.0)
HEMOGLOBIN: 10.2 g/dL — ABNORMAL LOW (ref 12.9–16.5)
LYMPHOCYTES ABSOLUTE COUNT: 1.4 10*9/L (ref 1.1–3.6)
LYMPHOCYTES RELATIVE PERCENT: 24 %
MEAN CORPUSCULAR HEMOGLOBIN CONC: 33.8 g/dL (ref 32.0–36.0)
MEAN CORPUSCULAR HEMOGLOBIN: 36.7 pg — ABNORMAL HIGH (ref 25.9–32.4)
MEAN CORPUSCULAR VOLUME: 108.6 fL — ABNORMAL HIGH (ref 77.6–95.7)
MEAN PLATELET VOLUME: 8.1 fL (ref 6.8–10.7)
MONOCYTES ABSOLUTE COUNT: 0.4 10*9/L (ref 0.3–0.8)
MONOCYTES RELATIVE PERCENT: 7.6 %
NEUTROPHILS ABSOLUTE COUNT: 3.8 10*9/L (ref 1.8–7.8)
NEUTROPHILS RELATIVE PERCENT: 67.5 %
PLATELET COUNT: 128 10*9/L — ABNORMAL LOW (ref 150–450)
RED BLOOD CELL COUNT: 2.79 10*12/L — ABNORMAL LOW (ref 4.26–5.60)
RED CELL DISTRIBUTION WIDTH: 14.7 % (ref 12.2–15.2)
WBC ADJUSTED: 5.6 10*9/L (ref 3.6–11.2)

## 2020-09-20 LAB — MAGNESIUM: MAGNESIUM: 1.8 mg/dL (ref 1.6–2.6)

## 2020-09-20 MED ORDER — FAMOTIDINE 20 MG TABLET
ORAL_TABLET | 1 refills | 0 days | Status: CP
Start: 2020-09-20 — End: ?

## 2020-09-20 MED ADMIN — heparin, porcine (PF) 100 unit/mL injection 500 Units: 500 [IU] | INTRAVENOUS | @ 14:00:00 | Stop: 2020-09-20

## 2020-09-20 NOTE — Unmapped (Addendum)
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  Transplant was tolerated well, complicated by mucositis requiring PCA.    Interval History:   John Erickson is here today for routine follow up. Have been tapering tacrolimus. He reports he has tolerated this well.     He reports he has good and bad days with his nausea. But over all not very bothersome.   He continues to do his best on nutrition- he is drinking 1 protein shake daily, in addition to a meat and a side for lunch and dinner. He is having more issues with carbohydrate foods; swallowing and digesting those are harder. He is also doing 1 milkshake at night. He is not having any diarrhea. Continues to do light exercise.    John Erickson denies fever, chills, cough, SOB, chest pain, diarrhea, or any other infectious symptoms.    ROS:  A comprehensive ROS performed and is negative except for pertinent positives as listed above in interval history.     I reviewed and updated past medical, surgical, social, and family history as appropriate.     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,XY[1]    Variants of Known or Likely Clinical Significance  Gene Transcript  Predicted Protein  VAF (%)   CEBPA c.753_762del p.Ser251Argfs 41      Variants of Unknown Significance  Gene Transcript Predicted Protein VAF (%)   TET2 c.4946A>G p.Tyr1649Cys 49      -  FLT-3-ITD and FLT-3-TKD studies are negative    Pertinent Phenotypic data:    Disease-specific prognostic estimate:     Induction:   7+3+HD Dauno (C1D1 2/24)    Recovery Marrow:    Final Diagnosis   Date Value Ref Range Status   08/12/2019   Final    Bone marrow, right iliac, aspiration and biopsy  -  Normocellular bone marrow (60%) with trilineage hematopoiesis and 4% blasts by manual aspirate differential  -  Routine cytogenetic analysis is pending  -  Flow cytometric MRD analysis is pending    This electronic signature is attestation that the pathologist personally reviewed the submitted material(s) and the final diagnosis reflects that evaluation.            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           Molecular Genetics: No results found for: MYELOIDMP       AML (acute myeloid leukemia) (CMS-HCC)   07/09/2019 Biopsy    Bone marrow, left iliac,  aspiration and biopsy  -  Hypercellular bone marrow (95%) involved by acute myeloid leukemia (42% blasts by manual aspirate differential)   Abnormal Karyotype: 46,Y,t(X;8)(q26;q11.2)[19]/46,XY[1]  Variants of Known or Likely Clinical Significance  Gene Transcript  Predicted Protein  VAF (%)   CEBPA c.753_762del p.Ser251Argfs 41      Variants of Unknown Significance  Gene Transcript Predicted Protein VAF (%)   TET2 c.4946A>G p.Tyr1649Cys 49      -  FLT-3-ITD and FLT-3-TKD studies are negative     07/09/2019 Initial Diagnosis    AML (acute myeloid leukemia) (CMS-HCC)     07/14/2019 - 07/20/2019 Chemotherapy    IP LEUKEMIA 7+3 HIGH DOSE DAUNOrubicin  DAUNOrubicin 90 mg/m2 IV on Days 1, 2, 3  Cytarabine 100 mg/m2 CIVI on Days 1 to 7     07/27/2019 Biopsy    Bone marrow, right iliac, aspiration and biopsy  -  Hypocellular bone marrow (<5%) with treatment effect, markedly reduced trilineage hematopoiesis, and 3% blasts by manual aspirate differential (see Comment).     08/24/2019 - 09/28/2019 Chemotherapy    IP LEUKEMIA HIGH DOSE CYTARABINE CONSOLIDATION (3 G/M2)  cytarabine 3 g/m2 every 12 hours on days 1, 3 and 5 every 28 days     08/24/2019 - 12/03/2019 Chemotherapy    IP LEUKEMIA HIGH DOSE CYTARABINE CONSOLIDATION (3 G/M2) ON DAYS 1,2,3  cytarabine 3 g/m2 IV every 12 hours on days 1, 2, 3, every 28 days     01/19/2020 - 01/19/2020 Chemotherapy    BMT OP BUSULFAN TEST DOSE  Busulfan 0.8 mg/kg IV ONCE     01/26/2020 -  Chemotherapy    BMT IP MAC BUSULFAN / FLUDARABINE / rATG (MUD)   Busulfan IV Days -6 to -3  Fludarabine 40 mg/m2 IV Days -6 to -3  rATG 0.5 mg/kg IV Day -3, 1.5 mg/kg IV Day -2, 2.5 mg/kg IV Day -1       Physical exam:  BP 125/89  - Pulse 70  - Temp 36.4 ??C (97.6 ??F) (Oral)  - Resp 19  - Ht 170.2 cm (5' 7)  - Wt 69.8 kg (153 lb 12.8 oz)  - SpO2 97%  - BMI 24.09 kg/m??     80, Normal activity with effort; some signs or symptoms of disease (ECOG equivalent 1)    General: No acute distress noted.  Central Venous Access: Port accessed-Site is clean dry and intact.   ENT: Moist mucous membranes. Left eye with mild ptosis. Oropharhynx without lesions, erythema or exudate.   Cardiovascular: Pulse normal rate, regularity and rhythm. S1 and S2 normal, without any murmur, rub, or gallop.  Lungs: Clear to auscultation bilaterally, without wheezes/crackles/rhonchi. Good air movement.   Skin: Dry. No new lesions. No rash or abrasions.  Psychiatry: Alert and oriented to person, place, and time.   Gastrointestinal/Abdomen: Normoactive bowel sounds, abdomen soft, non-tender   Musculoskeletal/Extremities: FROM and equal strength bilaterally.     Neurologic: Cranial nerves grossly intact. Grossly normal strength and sensation throughout.    Labs:  I reviewed all labs from today in Epic. See EMR for lab results.    Lab Results   Component Value Date    WBC 6.2 08/29/2020    HGB 9.5 (L) 08/29/2020    HCT 28.2 (L) 08/29/2020    PLT 122 (L) 08/29/2020     Lab Results   Component Value Date    NA 138 08/29/2020    K 4.3 08/29/2020    CL 109 (H) 08/29/2020  CO2 26.0 08/29/2020    BUN 30 (H) 08/29/2020    CREATININE 1.07 08/29/2020    GLU 112 08/29/2020    CALCIUM 9.1 08/29/2020    MG 1.7 08/29/2020    PHOS 4.1 02/22/2020     Lab Results   Component Value Date    BILITOT 0.3 08/29/2020    BILIDIR 0.30 02/21/2020    PROT 6.2 08/29/2020    ALBUMIN 3.5 08/29/2020    ALT 18 08/29/2020    AST 25 08/29/2020    ALKPHOS 69 08/29/2020    GGT 21 01/26/2020     Lab Results   Component Value Date    PT 14.1 (H) 02/22/2020    INR 1.21 02/22/2020    APTT 33.2 02/17/2020     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 Immediate 4 people.    Assessment and Plan    BMT:??AML, CR MRD Negative  HCT-CI (age adjusted) 7??(psych, severe pulmonary dysfunction, and age)   Conditioning:??MAC Bu/Flu/ATG  Donor:??9/10, ABO O+, CMV+  Engraftment:??Date of last granix injection: 02/19/20    Chimerism:   - 04/07/20: 94% donor in CD3+ and >95% donor in UF.  Has Day 37 BmBx was scheduled for 12/8, but now moved to 12/15 since he was later arriving today.    - 05/03/20: Bone marrow biopsy confirms ongoing CR1 with MRD-. 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     GVHD prophylaxis:??  1. Tacrolimus now being tapered as below  2. Methotrexate??15 mg/m2 IVP on day +1 then 10 mg/m2 on days +3, +6 and +11  3. ATG per [facility] standard dosing was included    05/31/20: Plan to taper tacrolimus by 0.5 mg every 2 weeks:  06/07/20: 1 mg/0.5 mg  06/21/20:  0.5mg /0.5mg   07/05/20: 0.5 mg qPM.   08/29/20: Has bounced up to 0.5 mg BID and back down to 0.5 mg once daily a couple times now. Will try adding Pepcid 20 mg BID to help with acid stomach that patient reports is contributing to or causing nausea. If no relief, will discuss EGD.  09/15/20: Tac 0.5 mg every day. Continue x 2 weeks then on 09/29/20 change to 0.5 mg every other day then consider to DC.   ??  Heme:??  Transfusion criteria:??1 unit of PRBCs for Hgb<7 and 1 unit of platelets for Plt <10K or bleeding.   -??No history of transfusion reactions.   - Plts decreased, but now slowly improving.  Possibly related to whichever medication caused edema or slow engraftment.   - Mild anemia and thrombocytopenia    ID:??  Prophylaxis:  - Antiviral: Valtrex 500mg  daily   - Antifungal: None  - PJP: Begin Dapsone 100mg  PO daily (has Sulfa allergy) on 12/1.   - CMV D+/R+: Restarted Letermovir prophy on 12/1, 480mg  po daily, Continue through D+100. Tressia Miners has been stopped.     Vaccines:  -08/17/20: 6 month vaccines + #1 Shingrix given today  Has not received Covid vaccines, but did receive Evusheld 150 mg x 2 doses on 2/21 and 08/01/20    C.Diff:   - Mild symptoms, positive on 12/1  - Oral Vancomycin (12/2-12/11) completed 10 day course  - Repeat C.diff 12/15 was negative    Vesicular lesions:  - Initial AC fossa lesion 10/25, lesions were migratory and vesicular in a linear fashion  - Swab and blood both negative for VZV  or HSV. Trial of treatment dose Valtrex did not change rash.    - As of 11/16 lesions resolved and no further rash.   ??  Allergy:  - PCN allergy (hives) has tolerated Cefepime without issues    Seasonal Allergies:  - Flonase and Claritin daily prn.   ??  EBV:  - IgM Ab + 01/05/20 but viral load 01/05/20 not detected.   - Viral Load 12/8, not detected    *Full virals sent 11/9 given mild thrombocytopenia and rash this panel was negative.  ??  Renal:   AKI:   - 05/10/20: Scr 2.89 today. Held tacrolimus and started IVF. Patient was instructed to drink plenty of fluids, preferably more than 3L/day, if possible.  - 08/01/20: Cr 1  - 08/17/20: Cr 1.31, encouraged to push fluids and adjusting Lyrica as below  - 08/29/20: Pt reports not sure if he's drinking enough. However, Cr remain elevated.     FEN:   Hypomagnesemia:  Off supplement, level 1.7 today 4/12.   ??  Hepatic:??  - No active issues.   - VOD prophylaxis with Actigall held since 11/17 due to swelling/rash.      Pulm: DLCO 62%  - Former smoker, quit 01/12/20. Using nicotine patch currently. Has chronic dry cough but PFTs were acceptable.   07/24/19: CT chest w/ 0.6cm RUL nodules, no clear etiology.     ** Discussed with Dr. Oswald Hillock prior to transplant, ok to move forward, no plans to repeat unless new findings.       Neuro/Pain:??  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 Flexeril prn    Psychosocial:??  - Substance abuse (opioid history). Sober for 5 years.  - Followed by Frederik Schmidt in [facility], placed referral for [facility] while admitted, are not seeing inpatients, will follow- up with him post discharge.   - Insomnia: Continue Trazodone 50mg  nightly prn.     Bone health:  Dexa scan from 10/13 resulted and shows low bone density.   - Seen by pharmacy and discussed zometa and calcium supplemenation 10/22  - Zometa first dose on 03/23/20.  Will need to monitor for recurrence of swelling when we give second dose since it occurred right after 1st dose.    Endocrine:   Pt Requested to check Testosterone level on 3/31, resulted normal at 380.  ??  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   - Cont lyrica morning dose to 75 mg and continue 150 mg in the evening when his PN symptoms are the worst.   - Tacrolimus tapering as above.  - Continue Zyprexa 5 mg nightly   - RTC in 2 weeks      Lanae Boast, MD  Associate Professor  Hematology [facility]

## 2020-09-20 NOTE — Unmapped (Signed)
09/15/20: Tac reduced to 0.5 mg every day. Continue x 2 weeks then on 09/29/20 change to 0.5 mg every other day.

## 2020-09-21 LAB — CMV DNA, QUANTITATIVE, PCR: CMV VIRAL LD: NOT DETECTED

## 2020-09-21 LAB — EBV QUANTITATIVE PCR, BLOOD: EBV VIRAL LOAD RESULT: NOT DETECTED

## 2020-09-25 DIAGNOSIS — R7989 Other specified abnormal findings of blood chemistry: Principal | ICD-10-CM

## 2020-10-03 NOTE — Unmapped (Signed)
Patient John Erickson was contacted today regarding rescheduling appt.'s from 10/03/2020. Voicemail was left for patient with information to call back.

## 2020-10-06 MED ORDER — OLANZAPINE 5 MG TABLET
ORAL_TABLET | 2 refills | 0 days | Status: CP
Start: 2020-10-06 — End: ?

## 2020-10-17 ENCOUNTER — Encounter: Admit: 2020-10-17 | Discharge: 2020-10-17 | Payer: PRIVATE HEALTH INSURANCE

## 2020-10-17 DIAGNOSIS — C9201 Acute myeloblastic leukemia, in remission: Principal | ICD-10-CM

## 2020-10-17 DIAGNOSIS — Z9484 Stem cells transplant status: Principal | ICD-10-CM

## 2020-10-17 LAB — CBC W/ AUTO DIFF
BASOPHILS ABSOLUTE COUNT: 0 10*9/L (ref 0.0–0.1)
BASOPHILS RELATIVE PERCENT: 0.3 %
EOSINOPHILS ABSOLUTE COUNT: 0 10*9/L (ref 0.0–0.5)
EOSINOPHILS RELATIVE PERCENT: 0.5 %
HEMATOCRIT: 28.2 % — ABNORMAL LOW (ref 39.0–48.0)
HEMOGLOBIN: 9.7 g/dL — ABNORMAL LOW (ref 12.9–16.5)
LYMPHOCYTES ABSOLUTE COUNT: 1.1 10*9/L (ref 1.1–3.6)
LYMPHOCYTES RELATIVE PERCENT: 20.1 %
MEAN CORPUSCULAR HEMOGLOBIN CONC: 34.3 g/dL (ref 32.0–36.0)
MEAN CORPUSCULAR HEMOGLOBIN: 37.5 pg — ABNORMAL HIGH (ref 25.9–32.4)
MEAN CORPUSCULAR VOLUME: 109.4 fL — ABNORMAL HIGH (ref 77.6–95.7)
MEAN PLATELET VOLUME: 7.8 fL (ref 6.8–10.7)
MONOCYTES ABSOLUTE COUNT: 0.5 10*9/L (ref 0.3–0.8)
MONOCYTES RELATIVE PERCENT: 9.4 %
NEUTROPHILS ABSOLUTE COUNT: 4 10*9/L (ref 1.8–7.8)
NEUTROPHILS RELATIVE PERCENT: 69.7 %
PLATELET COUNT: 117 10*9/L — ABNORMAL LOW (ref 150–450)
RED BLOOD CELL COUNT: 2.58 10*12/L — ABNORMAL LOW (ref 4.26–5.60)
RED CELL DISTRIBUTION WIDTH: 13.4 % (ref 12.2–15.2)
WBC ADJUSTED: 5.7 10*9/L (ref 3.6–11.2)

## 2020-10-17 LAB — COMPREHENSIVE METABOLIC PANEL
ALBUMIN: 3.7 g/dL (ref 3.4–5.0)
ALKALINE PHOSPHATASE: 64 U/L (ref 46–116)
ALT (SGPT): 17 U/L (ref 10–49)
ANION GAP: 1 mmol/L — ABNORMAL LOW (ref 5–14)
AST (SGOT): 22 U/L (ref <=34)
BILIRUBIN TOTAL: 0.4 mg/dL (ref 0.3–1.2)
BLOOD UREA NITROGEN: 35 mg/dL — ABNORMAL HIGH (ref 9–23)
BUN / CREAT RATIO: 30
CALCIUM: 8.8 mg/dL (ref 8.7–10.4)
CHLORIDE: 110 mmol/L — ABNORMAL HIGH (ref 98–107)
CO2: 26 mmol/L (ref 20.0–31.0)
CREATININE: 1.16 mg/dL — ABNORMAL HIGH
EGFR CKD-EPI (2021) MALE: 73 mL/min/{1.73_m2} (ref >=60)
GLUCOSE RANDOM: 101 mg/dL (ref 70–179)
POTASSIUM: 4.2 mmol/L (ref 3.4–4.8)
PROTEIN TOTAL: 6.4 g/dL (ref 5.7–8.2)
SODIUM: 137 mmol/L (ref 135–145)

## 2020-10-17 LAB — MAGNESIUM: MAGNESIUM: 2 mg/dL (ref 1.6–2.6)

## 2020-10-17 MED ORDER — FAMOTIDINE 20 MG TABLET
ORAL_TABLET | Freq: Two times a day (BID) | ORAL | 2 refills | 90 days | Status: CP
Start: 2020-10-17 — End: 2021-01-15

## 2020-10-17 MED ADMIN — heparin, porcine (PF) 100 unit/mL injection 500 Units: 500 [IU] | INTRAVENOUS | @ 18:00:00 | Stop: 2020-10-17

## 2020-10-17 NOTE — Unmapped (Signed)
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  Transplant was generally well tolerated, tolerated, complicated by mucositis requiring PCA.    Interval History:   [name] is here today for routine follow up. Has been tapering tacrolimus:    09/15/20: Tac reduced to 0.5 mg every day. Continue x 2 weeks then on 09/29/20 change to 0.5 mg every other day.     He reports that he tapered tacrolimus as instructed. He is now taking 0.5 mg every other day.   He has been doing very well. He did not notice any new s/s with taper.   He has had some epigastric discomfort, which improved and nearly resolved with famotidine.   He denies N/V/D or abdominal discomfort.   Denies rash.  Denies dry eyes or dry mouth.   Continues to do light exercise.      [name] denies fever, chills, cough, SOB, chest pain, diarrhea, or any other infectious symptoms.    ROS:  A comprehensive ROS performed and is negative except for pertinent positives as listed above in interval history.     I reviewed and updated past medical, surgical, social, and family history as appropriate.     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,XY[1]    Variants of Known or Likely Clinical Significance  Gene Transcript  Predicted Protein  VAF (%)   CEBPA c.[id number] p.Ser251Argfs 41      Variants of Unknown Significance  Gene Transcript Predicted Protein VAF (%)   TET2 c.4946A>G p.Tyr1649Cys 49      -  FLT-3-ITD and FLT-3-TKD studies are negative    Pertinent Phenotypic data:    Disease-specific prognostic estimate:     Induction:   7+3+HD Dauno (C1D1 2/24)    Recovery Marrow:    Final Diagnosis   Date Value Ref Range Status   08/12/2019   Final    Bone marrow, right iliac, aspiration and biopsy  -  Normocellular bone marrow (60%) with trilineage hematopoiesis and 4% blasts by manual aspirate differential  -  Routine cytogenetic analysis is pending  -  Flow cytometric MRD analysis is pending    This electronic signature is attestation that the pathologist personally reviewed the submitted material(s) and the final diagnosis reflects that evaluation.            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;q11.2)[19]/46,XY[1]  Variants of Known or Likely Clinical Significance  Gene Transcript  Predicted Protein  VAF (%)   CEBPA c.753_762del p.Ser251Argfs 41      Variants of Unknown Significance  Gene Transcript Predicted Protein VAF (%)   TET2 c.4946A>G p.Tyr1649Cys 49      -  FLT-3-ITD and FLT-3-TKD studies are negative     07/09/2019 Initial Diagnosis    AML (acute myeloid leukemia) (CMS-HCC)     07/14/2019 - 07/20/2019 Chemotherapy    IP LEUKEMIA 7+3 HIGH DOSE DAUNOrubicin  DAUNOrubicin 90 mg/m2 IV on Days 1, 2, 3  Cytarabine 100 mg/m2 CIVI on Days 1 to 7     07/27/2019 Biopsy    Bone marrow, right iliac, aspiration and biopsy  -  Hypocellular bone marrow (<5%) with treatment effect, markedly reduced trilineage hematopoiesis, and 3% blasts by manual aspirate differential (see Comment).     08/24/2019 - 09/28/2019 Chemotherapy    IP LEUKEMIA HIGH DOSE CYTARABINE CONSOLIDATION (3 G/M2)  cytarabine 3 g/m2 every 12 hours on days 1, 3 and 5 every 28 days     08/24/2019 - 12/03/2019 Chemotherapy    IP LEUKEMIA HIGH DOSE CYTARABINE CONSOLIDATION (3 G/M2) ON DAYS 1,2,3  cytarabine 3 g/m2 IV every 12 hours on days 1, 2, 3, every 28 days     01/19/2020 - 01/19/2020 Chemotherapy    BMT OP BUSULFAN TEST DOSE  Busulfan 0.8 mg/kg IV ONCE     01/26/2020 -  Chemotherapy    BMT IP MAC BUSULFAN / FLUDARABINE / rATG (MUD)   Busulfan IV Days -6 to -3  Fludarabine 40 mg/m2 IV Days -6 to -3  rATG 0.5 mg/kg IV Day -3, 1.5 mg/kg IV Day -2, 2.5 mg/kg IV Day -1         Current Outpatient Medications:   ???  calcium carbonate (OS-CAL) 1,500 mg (600 mg elem calcium) tablet, Take 1 tablet by mouth Two (2) times a day., Disp: , Rfl:   ???  cholecalciferol, vitamin D3-25 mcg, 1,000 unit,, (VITAMIN D3) 25 mcg (1,000 unit) capsule, Take 25 mcg by mouth daily. , Disp: , Rfl:   ???  dapsone 100 MG tablet, Take 1 tablet (100 mg total) by mouth daily., Disp: 30 tablet, Rfl: 5  ???  famotidine (PEPCID) 20 MG tablet, Take 1 tablet (20 mg total) by mouth Two (2) times a day., Disp: 180 tablet, Rfl: 2  ???  loratadine (CLARITIN) 10 mg tablet, Take 1 tablet (10 mg total) by mouth daily., Disp: 30 tablet, Rfl: 2  ???  OLANZapine (ZYPREXA) 5 MG tablet, TAKE 1 TABLET BY MOUTH EVERY DAY AT NIGHT, Disp: 30 tablet, Rfl: 2  ???  pregabalin (LYRICA) 75 MG capsule, 1 tablet in the morning and 2 tablet in the evening, Disp: 90 capsule, Rfl: 1  ???  traZODone (DESYREL) 50 MG tablet, Take 2 tablets (100 mg total) by mouth nightly as needed for sleep (insomnia)., Disp: 60 tablet, Rfl: 3    Physical exam:  BP 122/74  - Pulse 76  - Temp 36.8 ??C (98.2 ??F) (Temporal)  - Resp 18  - Ht 170.2 cm (5' 7.01)  - Wt 70.5 kg (155 lb 6.4 oz)  - SpO2 95%  - BMI 24.33 kg/m??  80, Normal activity with effort; some signs or symptoms of disease (ECOG equivalent 1)    General: No acute distress noted.  Central Venous Access: Port accessed-Site is clean dry and intact.   ENT: Moist mucous membranes. Left eye with mild ptosis. Oropharhynx without lesions, erythema or exudate.   Cardiovascular: Pulse normal rate, regularity and rhythm. S1 and S2 normal, without any murmur, rub, or gallop.  Lungs: Clear to auscultation bilaterally, without wheezes/crackles/rhonchi. Good air movement.   Skin: Dry. No new lesions. No rash or abrasions.  Psychiatry: Alert and oriented to person, place, and time.   Gastrointestinal/Abdomen: Normoactive bowel sounds, abdomen soft, non-tender   Musculoskeletal/Extremities: FROM and equal strength bilaterally.     Neurologic: Cranial nerves grossly intact. Grossly normal strength and sensation throughout.    Labs:  I reviewed all labs from today in Epic. See EMR for lab results.    Lab Results   Component Value Date    WBC 5.7 10/17/2020    HGB 9.7 (L) 10/17/2020    HCT 28.2 (L) 10/17/2020    PLT 117 (L) 10/17/2020     Lab Results   Component Value Date    NA 138 09/20/2020    K 4.3 09/20/2020    CL 107 09/20/2020    CO2 26.0 09/20/2020    BUN 27 (H) 09/20/2020    CREATININE 1.25 (H) 09/20/2020    GLU 97 09/20/2020    CALCIUM 9.4 09/20/2020    MG 2.0 10/17/2020    PHOS 4.1 02/22/2020     Lab Results   Component Value Date    BILITOT 0.4 09/20/2020    BILIDIR 0.30 02/21/2020    PROT 6.5 09/20/2020    ALBUMIN 3.7 09/20/2020    ALT 20 09/20/2020    AST 26 09/20/2020    ALKPHOS 57 09/20/2020    GGT 21 01/26/2020     Lab Results   Component Value Date    PT 14.1 (H) 02/22/2020    INR 1.21 02/22/2020    APTT 33.2 02/17/2020     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 Immediate 4 people.    Assessment and Plan:    BMT:??AML, CR MRD Negative  HCT-CI (age adjusted) 7??(psych, severe pulmonary dysfunction, and age)   Conditioning:??MAC Bu/Flu/ATG  Donor:??9/10, ABO O+, CMV+  Engraftment:??Date of last granix injection: 02/19/20    Chimerism:   - 04/07/20: 94% donor in CD3+ and >95% donor in UF.  Has Day 3 BmBx was scheduled for 12/8, but now moved to 12/15 since he was later arriving today.    - 05/03/20: Bone marrow biopsy confirms ongoing CR1 with MRD-. 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     GVHD prophylaxis:??  1. Tacrolimus now being tapered as below  2. Methotrexate??15 mg/m2 IVP on day +1 then 10 mg/m2 on days +3, +6 and +11  3. ATG per [facility] standard dosing was included  ??    05/31/20: Plan to taper tacrolimus by 0.5 mg every 2 weeks:  1/19: 1 mg/0.5 mg  2/2:  0.5mg /0.5mg   2/16: 0.5 mg qPM.   4/12: Has bounced up to 0.5 mg BID and back down to 0.5 mg once daily a couple times now. Will try adding Pepcid 20 mg BID to help with acid stomach that patient reports is contributing to or causing nausea. If no relief, will discuss EGD.  09/15/20: Tac 0.5 mg every day. Continue x 2 weeks then on 09/29/20 change to 0.5 mg every other day.   10/17/20: Tac 0.5 mg every other day x 2 weeks. Adding Pepcid helped control epigastric discomfort. Today patient denies any GI symptoms.   He has tolerated tacrolimus taper very well. No evidence of GVHD.   Will DC tacrolimus today.   ??  Heme:??  Transfusion criteria:??1 unit of PRBCs for Hgb<7 and 1 unit of platelets for Plt <10K or bleeding.   -??No history of transfusion reactions.   - Plts decreased, but now slowly improving.  - Mild anemia: vit. B12 and folate checked in 03/22 WNL.   - 10/17/20: Continued mild anemia and thrombocytopenia. Stable. CMV and EBV undetectable. This may be due to Dapsone. We are stopping tacrolimus today. May be able to DC Dapsone next visit.     ID:??  Prophylaxis:  - Antiviral: Valtrex 500mg  daily   - Antifungal: None  - PJP: Begin Dapsone 100mg  PO daily (has Sulfa allergy) on 12/1.   - CMV D+/R+: Restarted Letermovir prophy on 12/1, 480mg  po daily, Continue through D+100. Tressia Miners has been stopped.     Vaccines:  -08/17/20: 6 month vaccines + #1 Shingrix given today  Has not received Covid vaccines, but did receive Evusheld 150 mg x 2 doses on 2/21 and 08/01/20  - 10/17/20: Will be due for 12 mo vaccines in 01/2021    C.Diff:   - Mild symptoms, positive on 12/1  - Oral Vancomycin (12/2-12/11) completed 10 day course  - Repeat C.diff 12/15 was negative    Vesicular lesions:  - Initial AC fossa lesion 10/25, lesions were migratory and vesicular in a linear fashion  - Swab and blood both negative for VZV or HSV. Trial of treatment dose Valtrex did not change rash.    - As of 11/16 lesions resolved and no further rash.   ??  Allergy:  - PCN allergy (hives) has tolerated Cefepime without issues    Seasonal Allergies:  - Flonase and Claritin daily prn.   ??  EBV:  - IgM Ab + 01/05/20 but viral load 01/05/20 not detected.   - Viral Load 12/8, not detected    *Full virals sent 11/9 given mild thrombocytopenia and rash this panel was negative.  ??  Renal:   AKI:   - 05/10/20: Scr 2.89 today. Held tacrolimus and started IVF. Patient was instructed to drink plenty of fluids, preferably more than 3L/day, if possible.  - 08/01/20: Cr 1  - 08/17/20: Cr 1.31, encouraged to push fluids and adjusting Lyrica as below  - 08/29/20: Pt reports not sure if he's drinking enough. However, Cr down to 1.07.  - 10/17/20: Cr stable but remains mildly elevated. If does not normalize after stopping tacrolimus may need nephrology consult.     FEN:   Hypomagnesemia:  Off supplement, level 1.7 today 4/12.   ??  Hepatic:??  - No active issues.   - VOD prophylaxis with Actigall held since 11/17 due to swelling/rash.      Pulm: DLCO 62%  - Former smoker, quit 01/12/20. Using nicotine patch currently. Has chronic dry cough but PFTs were acceptable. 07/24/19: CT chest w/ 0.6cm RUL nodules, no clear etiology.     ** Discussed with Dr. Oswald Hillock prior to transplant, ok to move forward, no plans to repeat unless new findings.       Neuro/Pain:??  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 Flexeril prn    Psychosocial:??  - Substance abuse (opioid history). Sober for 5 years.  - Followed by Frederik Schmidt in [facility], placed referral for [facility] while admitted, are not seeing inpatients, will follow- up with him post discharge.   - Insomnia: Continue Trazodone 50mg  nightly prn.     Bone health:  Dexa scan from 10/13 resulted and shows low bone density.   - Seen by pharmacy and discussed zometa and calcium supplemenation 10/22  - Zometa first dose on 03/23/20.  Will need to monitor for recurrence of swelling when we give second dose since it occurred right after 1st dose.    Endocrine:   Pt Requested to check Testosterone level on 3/31, resulted normal at 380.  ??  Summary:   - Day 90 BMBx indicated ongoing CR1 MRD-. >95% donor chimera.   - Follow disease with checking chimerism every 2-3 months. Ordered.   - Mild cytopenia: if doing well, may be able to DC Dapsone and reassess.   - 5/31 DC tacrolimus. Observe for s/s GVHD. Follow every 2 weeks for 3-4 mo.   - Continue Pepcid 20 mg BID to treat acid stomach: relieved epigastric symptoms.   - Continue Zyprexa 5 mg nightly   - Cont Lyrica morning dose to 75 mg and continue 150 mg in the evening when his PN symptoms are the worst.   - RTC in 2 weeks      Lanae Boast, MD  Associate Professor  [facility]

## 2020-10-17 NOTE — Unmapped (Signed)
Please stop tacrolimus.     Please call us right away if you start experiencing any new symptoms, in particular rash, nausea, vomiting diarrhea, abdominal pain, dry eyes, dry mouth, mouth sores, stiff joints.

## 2020-10-19 LAB — CMV DNA, QUANTITATIVE, PCR: CMV VIRAL LD: NOT DETECTED

## 2020-10-23 LAB — EBV QUANTITATIVE PCR, BLOOD: EBV VIRAL LOAD RESULT: NOT DETECTED

## 2020-10-26 NOTE — Unmapped (Signed)
PER PATIENT TACROLIMUS HAS BEEN DISCONTINUED.

## 2020-10-27 NOTE — Unmapped (Signed)
Specialty Medication(s): tacrolimus    [name] has been dis-enrolled from the [facility] specialty pharmacy services due to therapy completion - expected therapy completion date: tacrolimus script discontinued at [facility] on 10/26/20.    Additional information provided to the patient: NA    John Erickson A [facility] Specialty Pharmacist

## 2020-10-28 MED ORDER — OLANZAPINE 5 MG TABLET
ORAL_TABLET | 1 refills | 0 days | Status: CP
Start: 2020-10-28 — End: ?

## 2020-11-06 DIAGNOSIS — Z9484 Stem cells transplant status: Principal | ICD-10-CM

## 2020-11-06 NOTE — Unmapped (Signed)
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  Transplant was generally well tolerated, tolerated, complicated by mucositis requiring PCA.    Interval History:   John Erickson is here today for routine follow up and is accompanied by his fiance. He has tapered off tacrolimus. He does have ongoing nausea that has not changed since tac taper. Eating and drinking well. He has been working out daily and working on gaining wt. Weight is up from last visit. John Erickson denies fever, chills, cough, SOB, chest pain, diarrhea, rash, dry eye, dry mouth, stiff joints or any other infectious symptoms.    ROS:  A comprehensive ROS performed and is negative except for pertinent positives as listed above in interval history.     I reviewed and updated past medical, surgical, social, and family history as appropriate.  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 Immediate 4 people..     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,XY[1]    Variants of Known or Likely Clinical Significance  Gene Transcript  Predicted Protein  VAF (%)   CEBPA c.753_762del p.Ser251Argfs 41      Variants of Unknown Significance  Gene Transcript Predicted Protein VAF (%)   TET2 c.4946A>G p.Tyr1649Cys 49      - FLT-3-ITD and FLT-3-TKD studies are negative    Pertinent Phenotypic data:    Disease-specific prognostic estimate:     Induction:   7+3+HD Dauno (C1D1 2/24)    Recovery Marrow:    Final Diagnosis   Date Value Ref Range Status   08/12/2019   Final    Bone marrow, right iliac, aspiration and biopsy  -  Normocellular bone marrow (60%) with trilineage hematopoiesis and 4% blasts by manual aspirate differential  -  Routine cytogenetic analysis is pending  -  Flow cytometric MRD analysis is pending    This electronic signature is attestation that the pathologist personally reviewed the submitted material(s) and the final diagnosis reflects that evaluation.            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 (42%  blasts by manual aspirate differential)   Abnormal Karyotype: 46,Y,t(X;8)(q26;q11.2)[19]/46,XY[1]  Variants of Known or Likely Clinical Significance  Gene Transcript  Predicted Protein  VAF (%)   CEBPA c.753_762del p.Ser251Argfs 41      Variants of Unknown Significance  Gene Transcript Predicted Protein VAF (%)   TET2 c.4946A>G p.Tyr1649Cys 49      -  FLT-3-ITD and FLT-3-TKD studies are negative     07/09/2019 Initial Diagnosis    AML (acute myeloid leukemia) (CMS-HCC)     07/14/2019 - 07/20/2019 Chemotherapy    IP LEUKEMIA 7+3 HIGH DOSE DAUNOrubicin  DAUNOrubicin 90 mg/m2 IV on Days 1, 2, 3  Cytarabine 100 mg/m2 CIVI on Days 1 to 7     07/27/2019 Biopsy    Bone marrow, right iliac, aspiration and biopsy  - Hypocellular bone marrow (<5%) with treatment effect, markedly reduced trilineage hematopoiesis, and 3% blasts by manual aspirate differential (see Comment).     08/24/2019 - 09/28/2019 Chemotherapy    IP LEUKEMIA HIGH DOSE CYTARABINE CONSOLIDATION (3 G/M2)  cytarabine 3 g/m2 every 12 hours on days 1, 3 and 5 every 28 days     08/24/2019 - 12/03/2019 Chemotherapy    IP LEUKEMIA HIGH DOSE CYTARABINE CONSOLIDATION (3 G/M2) ON DAYS 1,2,3  cytarabine 3 g/m2 IV every 12 hours on days 1, 2, 3, every 28 days     01/19/2020 - 01/19/2020 Chemotherapy    BMT OP BUSULFAN TEST DOSE  Busulfan 0.8 mg/kg IV ONCE     01/26/2020 -  Chemotherapy    BMT IP MAC BUSULFAN / FLUDARABINE / rATG (MUD)   Busulfan IV Days -6 to -3  Fludarabine 40 mg/m2 IV Days -6 to -3  rATG 0.5 mg/kg IV Day -3, 1.5 mg/kg IV Day -2, 2.5 mg/kg IV Day -1         Current Outpatient Medications:   ???  calcium carbonate (OS-CAL) 1,500 mg (600 mg elem calcium) tablet, Take 1 tablet by mouth Two (2) times a day., Disp: , Rfl:   ???  dapsone 100 MG tablet, Take 1 tablet (100 mg total) by mouth daily., Disp: 30 tablet, Rfl: 5  ???  famotidine (PEPCID) 20 MG tablet, Take 1 tablet (20 mg total) by mouth Two (2) times a day., Disp: 180 tablet, Rfl: 2  ???  loratadine (CLARITIN) 10 mg tablet, Take 1 tablet (10 mg total) by mouth daily., Disp: 30 tablet, Rfl: 2  ???  OLANZapine (ZYPREXA) 5 MG tablet, TAKE 1 TABLET BY MOUTH EVERY DAY AT NIGHT, Disp: 90 tablet, Rfl: 1  ???  ondansetron (ZOFRAN-ODT) 8 MG disintegrating tablet, Take 1 tablet (8 mg total) by mouth every twelve (12) hours as needed for nausea., Disp: 30 tablet, Rfl: 1  ???  pregabalin (LYRICA) 75 MG capsule, 1 tablet in the morning and 2 tablet in the evening, Disp: 90 capsule, Rfl: 1  ???  traZODone (DESYREL) 50 MG tablet, Take 2 tablets (100 mg total) by mouth nightly as needed for sleep (insomnia)., Disp: 60 tablet, Rfl: 3  ???  valACYclovir (VALTREX) 500 MG tablet, Take 1 tablet (500 mg total) by mouth in the morning., Disp: 30 tablet, Rfl: 0    Physical exam:  BP 119/69  - Pulse 79  - Temp 36.7 ??C (98.1 ??F) (Temporal)  - Resp 16  - Ht 170.2 cm (5' 7.01)  - Wt 73.4 kg (161 lb 12.8 oz)  - SpO2 97%  - BMI 25.34 kg/m??     90,  Able to carry  on normal activity; minor signs or symptoms of disease (ECOG equivalent 0)    General: No acute distress noted.  Central Venous Access: Right chest port-Site is clean dry and intact.   ENT: Moist mucous membranes. Left eye with mild ptosis. Oropharhynx without lesions, erythema or exudate.   Cardiovascular: Pulse normal rate, regularity and rhythm. S1 and S2 normal, without any murmur, rub, or gallop.  Lungs: Clear to auscultation bilaterally, without wheezes/crackles/rhonchi. Good air movement.   Skin: W/D/I. No rash or abrasions.  Psychiatry: Alert and oriented to person, place, and time.   Gastrointestinal/Abdomen: Normoactive bowel sounds, abdomen soft, non-tender   Musculoskeletal/Extremities: FROM and equal strength bilaterally.     Neurologic: Cranial nerves grossly intact. Grossly normal strength and sensation throughout.    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, 2020  Skin: BSA based features: None (0);  non-BSA based features No sclerotic features (0); Other features:  NONE  Mouth: No symptoms (0) Lichen planus-like features present (Diagnostic) no  Eyes: No symptoms (0) Keratoconjunctivitis sicca confirmed by opthalmologist no  GI Tract: features None and Features present not due to GVHD and due to history of nausea preceeding transplant No symptoms (0)  Liver: Normal total bili and ALT/AP <3x ULN (0)  Lungs: symptom score No symptoms (0), FEV1 score No PFTs since last visit  Joints and Fascia: symptom score No symptoms (0) P-ROM shoulder 7 (normal), Elbow 7 (normal), Wrist/Fingers 7 (normal), Ankles 4 (normal)      Genital tract: Not examined  Other features due to cGVHD: None    Overall cGVHD Severity (current):  None   Max Severity of cGVHD post-transplant: None       Labs:  I reviewed all labs from today in Epic. See EMR for lab results.    Lab Results   Component Value Date    WBC 7.2 11/07/2020    HGB 10.2 (L) 11/07/2020    HCT 30.5 (L) 11/07/2020    PLT 101 (L) 11/07/2020     Lab Results   Component Value Date    NA 139 11/07/2020    K 3.6 11/07/2020    CL 108 (H) 11/07/2020    CO2 27.0 11/07/2020    BUN 17 11/07/2020    CREATININE 1.10 11/07/2020    GLU 189 (H) 11/07/2020    CALCIUM 9.0 11/07/2020    MG 1.7 11/07/2020    PHOS 4.1 02/22/2020     Lab Results   Component Value Date    BILITOT 0.4 11/07/2020    BILIDIR 0.30 02/21/2020    PROT 6.0 11/07/2020    ALBUMIN 3.5 11/07/2020    ALT 18 11/07/2020    AST 25 11/07/2020    ALKPHOS 56 11/07/2020    GGT 21 01/26/2020     Lab Results   Component Value Date    PT 14.1 (H) 02/22/2020    INR 1.21 02/22/2020    APTT 33.2 02/17/2020     Assessment and Plan:    BMT:??AML, CR MRD Negative  HCT-CI (age adjusted) 7??(psych, severe pulmonary dysfunction, and age)   Conditioning:??MAC Bu/Flu/ATG  Donor:??9/10, ABO O+, CMV+  Engraftment:??Date of last granix injection: 02/19/20    Chimerism:   - 04/07/20: 94% donor in CD3+ and >95% donor in UF.  Has Day 18 BmBx was scheduled for 12/8, but now moved to 12/15 since he was later arriving today.    - 05/03/20: Bone marrow biopsy confirms ongoing CR1 with MRD-. 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   -  11/07/20: PB chimerisms pending from today.     GVHD prophylaxis:??  1. Tacrolimus now being tapered as below  2. Methotrexate??15 mg/m2 IVP on day +1 then 10 mg/m2 on days +3, +6 and +11  3. ATG per [facility] standard dosing was included  ??  05/31/20: Plan to taper tacrolimus by 0.5 mg every 2 weeks:  1/19: 1 mg/0.5 mg  2/2:  0.5mg /0.5mg   2/16: 0.5 mg qPM.   4/12: Has bounced up to 0.5 mg BID and back down to 0.5 mg once daily a couple times now. Will try adding Pepcid 20 mg BID to help with acid stomach that patient reports is contributing to or causing nausea. If no relief, will discuss EGD.  09/15/20: Tac 0.5 mg every day. Continue x 2 weeks then on 09/29/20 change to 0.5 mg every other day.   10/17/20: Tac 0.5 mg every other day x 2 weeks. Adding Pepcid helped control epigastric discomfort. Today patient denies any GI symptoms.   He has tolerated tacrolimus taper very well. No evidence of GVHD.   Will DC tacrolimus today.   11/07/20: Continues off tacrolimus without evidence of GVHD.   ??  Heme:??  Transfusion criteria:??1 unit of PRBCs for Hgb<7 and 1 unit of platelets for Plt <10K or bleeding.   -??No history of transfusion reactions.   - Plts decreased, but now slowly improving.  - Mild anemia: vit. B12 and folate checked in 03/22 WNL.   - 10/17/20: Continued mild anemia and thrombocytopenia. Stable. CMV and EBV undetectable. This may be due to Dapsone. We are stopping tacrolimus today. May be able to DC Dapsone next visit.     ID:??  Prophylaxis:  - Antiviral: Valtrex 500mg  daily   - Antifungal: None  - PJP: Begin Dapsone 100mg  PO daily (has Sulfa allergy) on 12/1. CD4 count is pending. If sufficient, will stop dapsone.  - CMV D+/R+:  Tressia Miners has been stopped.     Vaccines:  -08/17/20: 6 month vaccines + #1 Shingrix given today  Has not received Covid vaccines, but did receive Evusheld 150 mg x 2 doses on 2/21 and 08/01/20  - 10/17/20: Will be due for 12 mo vaccines in 01/2021    C.Diff:   - Mild symptoms, positive on 04/19/20  - Oral Vancomycin (12/2-12/11) completed 10 day course  - Repeat C.diff 05/03/20 was negative    Vesicular lesions:  - Initial AC fossa lesion 03/13/20, lesions were migratory and vesicular in a linear fashion  - Swab and blood both negative for VZV or HSV. Trial of treatment dose Valtrex did not change rash.    - As of 11/16 lesions resolved and no further rash.   ??  Allergy:  - PCN allergy (hives) has tolerated Cefepime without issues    Seasonal Allergies:  - Flonase and Claritin daily prn.   ??  EBV:  - IgM Ab + 01/05/20 but viral load 01/05/20 not detected.   - Viral Load 04/26/20, not detected    *Full virals sent 03/28/20 given mild thrombocytopenia and rash this panel was negative.  ??  Renal:   AKI:   - 05/10/20: Scr 2.89 today. Held tacrolimus and started IVF. Patient was instructed to drink plenty of fluids, preferably more than 3L/day, if possible.  - 08/01/20: Cr 1  - 08/17/20: Cr 1.31, encouraged to push fluids and adjusting Lyrica as below  - 08/29/20: Pt reports not sure if he's drinking enough. However, Cr down to 1.07.  - 10/17/20: Cr stable but  remains mildly elevated. If does not normalize after stopping tacrolimus may need nephrology consult.   -11/07/20: Cr down to 1.10.    FEN:   Hypomagnesemia:  Off supplement, level 1.7 today   ??  Hepatic:??  - No active issues.   - VOD prophylaxis with Actigall held since 04/05/20 due to swelling/rash.      Pulm: DLCO 62%  - Former smoker, quit 01/12/20. Using nicotine patch currently. Has chronic dry cough but PFTs were acceptable.   07/24/19: CT chest w/ 0.6cm RUL nodules, no clear etiology.     ** Discussed with Dr. Oswald Hillock prior to transplant, ok to move forward, no plans to repeat unless new findings.       Neuro/Pain:??  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 Flexeril prn    Psychosocial:??  - Substance abuse (opioid history). Sober for 5 years.  - Followed by Frederik Schmidt in [facility], placed referral for [facility] while admitted, are not seeing inpatients, will follow- up with him post discharge.   - Insomnia: Continue Trazodone 50mg  nightly prn.     Bone health:  Dexa scan from 03/01/20 resulted and shows low bone density.   - Seen by pharmacy and discussed zometa and calcium supplemenation 10/22  - Zometa first dose on 03/23/20.  Will need to monitor for recurrence of swelling when we give second dose since it occurred right after 1st dose.  -Received 2nd dose 06/20/20.    Endocrine:   Pt Requested to check Testosterone level on 08/17/20, resulted normal at 380.   Has endocrine follow up 12/05/20.  ??  Summary:   - Day 90 BMBx indicated ongoing CR1 MRD-. >95% donor chimera.   - Follow disease with checking chimerism every 2-3 months. Pending from today.   - Mild cytopenia: if doing well, may be able to DC Dapsone and reassess. CD4 count pending from today.  - Now off tacrolimus. Observe for s/s GVHD. Follow every 2 weeks for 3-4 mo.   - Continue Pepcid 20 mg BID to treat acid stomach: relieved epigastric symptoms.   - Continue Zyprexa 5 mg nightly   - Cont Lyrica morning dose to 75 mg and continue 150 mg in the evening when his PN symptoms are the worst.   - RTC in 2 weeks    Myra Rude, ANP  Bone Marrow Transplant and Cellular Therapy Program    I personally spent 41 minutes face-to-face and non-face-to-face in the care of this patient, which includes all pre, intra, and post visit time on the date of service.

## 2020-11-07 ENCOUNTER — Encounter: Admit: 2020-11-07 | Discharge: 2020-11-08 | Payer: PRIVATE HEALTH INSURANCE

## 2020-11-07 ENCOUNTER — Encounter
Admit: 2020-11-07 | Discharge: 2020-11-08 | Payer: PRIVATE HEALTH INSURANCE | Attending: Nurse Practitioner | Primary: Nurse Practitioner

## 2020-11-07 DIAGNOSIS — Z9484 Stem cells transplant status: Principal | ICD-10-CM

## 2020-11-07 DIAGNOSIS — C9201 Acute myeloblastic leukemia, in remission: Principal | ICD-10-CM

## 2020-11-07 LAB — COMPREHENSIVE METABOLIC PANEL
ALBUMIN: 3.5 g/dL (ref 3.4–5.0)
ALKALINE PHOSPHATASE: 56 U/L (ref 46–116)
ALT (SGPT): 18 U/L (ref 10–49)
ANION GAP: 4 mmol/L — ABNORMAL LOW (ref 5–14)
AST (SGOT): 25 U/L (ref <=34)
BILIRUBIN TOTAL: 0.4 mg/dL (ref 0.3–1.2)
BLOOD UREA NITROGEN: 17 mg/dL (ref 9–23)
BUN / CREAT RATIO: 15
CALCIUM: 9 mg/dL (ref 8.7–10.4)
CHLORIDE: 108 mmol/L — ABNORMAL HIGH (ref 98–107)
CO2: 27 mmol/L (ref 20.0–31.0)
CREATININE: 1.1 mg/dL
EGFR CKD-EPI (2021) MALE: 78 mL/min/{1.73_m2} (ref >=60)
GLUCOSE RANDOM: 189 mg/dL — ABNORMAL HIGH (ref 70–179)
POTASSIUM: 3.6 mmol/L (ref 3.4–4.8)
PROTEIN TOTAL: 6 g/dL (ref 5.7–8.2)
SODIUM: 139 mmol/L (ref 135–145)

## 2020-11-07 LAB — CBC W/ AUTO DIFF
BASOPHILS ABSOLUTE COUNT: 0 10*9/L (ref 0.0–0.1)
BASOPHILS RELATIVE PERCENT: 0.2 %
EOSINOPHILS ABSOLUTE COUNT: 0.1 10*9/L (ref 0.0–0.5)
EOSINOPHILS RELATIVE PERCENT: 1.2 %
HEMATOCRIT: 30.5 % — ABNORMAL LOW (ref 39.0–48.0)
HEMOGLOBIN: 10.2 g/dL — ABNORMAL LOW (ref 12.9–16.5)
LYMPHOCYTES ABSOLUTE COUNT: 0.6 10*9/L — ABNORMAL LOW (ref 1.1–3.6)
LYMPHOCYTES RELATIVE PERCENT: 8.8 %
MEAN CORPUSCULAR HEMOGLOBIN CONC: 33.6 g/dL (ref 32.0–36.0)
MEAN CORPUSCULAR HEMOGLOBIN: 37.9 pg — ABNORMAL HIGH (ref 25.9–32.4)
MEAN CORPUSCULAR VOLUME: 112.6 fL — ABNORMAL HIGH (ref 77.6–95.7)
MEAN PLATELET VOLUME: 8.3 fL (ref 6.8–10.7)
MONOCYTES ABSOLUTE COUNT: 0.6 10*9/L (ref 0.3–0.8)
MONOCYTES RELATIVE PERCENT: 8.1 %
NEUTROPHILS ABSOLUTE COUNT: 5.9 10*9/L (ref 1.8–7.8)
NEUTROPHILS RELATIVE PERCENT: 81.7 %
PLATELET COUNT: 101 10*9/L — ABNORMAL LOW (ref 150–450)
RED BLOOD CELL COUNT: 2.71 10*12/L — ABNORMAL LOW (ref 4.26–5.60)
RED CELL DISTRIBUTION WIDTH: 14.6 % (ref 12.2–15.2)
WBC ADJUSTED: 7.2 10*9/L (ref 3.6–11.2)

## 2020-11-07 LAB — MAGNESIUM: MAGNESIUM: 1.7 mg/dL (ref 1.6–2.6)

## 2020-11-07 LAB — CMV DNA, QUANTITATIVE, PCR: CMV VIRAL LD: NOT DETECTED

## 2020-11-07 LAB — LYMPH MARKER LIMITED,FLOW
ABSOLUTE CD3 CNT: 378 {cells}/uL — ABNORMAL LOW (ref 915–3400)
ABSOLUTE CD4 CNT: 84 {cells}/uL — ABNORMAL LOW (ref 510–2320)
ABSOLUTE CD8 CNT: 288 {cells}/uL (ref 180–1520)
CD3% (T CELLS): 63 % (ref 61–86)
CD4% (T HELPER): 14 % — ABNORMAL LOW (ref 34–58)
CD4:CD8 RATIO: 0.3 — ABNORMAL LOW (ref 0.9–4.8)
CD8% T SUPPRESR: 48 % — ABNORMAL HIGH (ref 12–38)

## 2020-11-07 MED ORDER — ONDANSETRON 8 MG DISINTEGRATING TABLET
ORAL_TABLET | Freq: Two times a day (BID) | ORAL | 1 refills | 15 days | Status: CP | PRN
Start: 2020-11-07 — End: 2021-11-07

## 2020-11-07 MED ORDER — VALACYCLOVIR 500 MG TABLET
ORAL_TABLET | Freq: Every day | ORAL | 0 refills | 30 days
Start: 2020-11-07 — End: 2020-12-07

## 2020-11-07 MED ADMIN — heparin, porcine (PF) 100 unit/mL injection 500 Units: 500 [IU] | INTRAVENOUS | @ 12:00:00 | Stop: 2020-11-07

## 2020-11-07 NOTE — Unmapped (Addendum)
It was good seeing you today.     Call if you develop any signs of GVHD-nausea, vomiting, diarrhea, rash, yellowing of the skin or whites of the eyes, dry eye, dry mouth, or stiffening joints.     I am checking your CD4 count, if it is high enough, I will be able to stop your dapsone and will give you a call about it.       Lab Results   Component Value Date    WBC 7.2 11/07/2020    HGB 10.2 (L) 11/07/2020    HCT 30.5 (L) 11/07/2020    PLT 101 (L) 11/07/2020     Lab Results   Component Value Date    NA 139 11/07/2020    K 3.6 11/07/2020    CL 108 (H) 11/07/2020    CO2 27.0 11/07/2020    BUN 17 11/07/2020    CREATININE 1.10 11/07/2020    GLU 189 (H) 11/07/2020    CALCIUM 9.0 11/07/2020    MG 1.7 11/07/2020    PHOS 4.1 02/22/2020     Lab Results   Component Value Date    BILITOT 0.4 11/07/2020    BILIDIR 0.30 02/21/2020    PROT 6.0 11/07/2020    ALBUMIN 3.5 11/07/2020    ALT 18 11/07/2020    AST 25 11/07/2020    ALKPHOS 56 11/07/2020    GGT 21 01/26/2020     Lab Results   Component Value Date    INR 1.21 02/22/2020    APTT 33.2 02/17/2020       For prescription refills:   For refills, please check your medication bottles to see if you have additional refills left. If so, please call your pharmacy and follow the directions to request a refill. If you do not have any refills left, please make a request during your clinic visit or by submitting a request through MyChart or by calling [phone]. Please allow 24 hours if your request is made during the week or 48 hours if requests are made on the weekends or holidays.     --------------------------------------------------------------------------------------------------------------------  For appointments & questions Monday through Friday 8 AM-4:30 PM     Please call [phone] or Toll free [phone]    On Nights, Weekends and Holidays  Call [phone] and ask for the oncologist on call    Please visit PrivacyFever[employer]cz, a resource created just for family members and caregivers.  This website lists support services, how and where to ask for help. It has tools to assist you as you help [place] care for your loved one.    [facility]  [address]  [place], [place] [address]  www.[email or web address]

## 2020-11-09 LAB — EBV QUANTITATIVE PCR, BLOOD: EBV VIRAL LOAD RESULT: NOT DETECTED

## 2020-11-17 NOTE — Unmapped (Signed)
Hi,    Patient John Erickson called requesting a medication refill for the following:    ??? Medication: pregabalin  ??? Dosage: 75 mg  ??? Days left of medication: 0  ??? Pharmacy: [id number]       The expected turnaround time is 3-4 business days       Thank you,  Kelli Hope  [facility]  [phone]

## 2020-11-27 MED ORDER — OLANZAPINE 5 MG TABLET
ORAL_TABLET | 2 refills | 0 days | Status: CP
Start: 2020-11-27 — End: ?

## 2020-11-28 ENCOUNTER — Encounter
Admit: 2020-11-28 | Discharge: 2020-11-29 | Payer: PRIVATE HEALTH INSURANCE | Attending: Nurse Practitioner | Primary: Nurse Practitioner

## 2020-11-28 ENCOUNTER — Encounter: Admit: 2020-11-28 | Discharge: 2020-11-29 | Payer: PRIVATE HEALTH INSURANCE

## 2020-11-28 DIAGNOSIS — Z9484 Stem cells transplant status: Principal | ICD-10-CM

## 2020-11-28 DIAGNOSIS — C9201 Acute myeloblastic leukemia, in remission: Principal | ICD-10-CM

## 2020-11-28 DIAGNOSIS — D84822 Immunocompromised state associated with stem cell transplant (CMS-HCC): Principal | ICD-10-CM

## 2020-12-08 DIAGNOSIS — G5793 Unspecified mononeuropathy of bilateral lower limbs: Principal | ICD-10-CM

## 2020-12-08 MED ORDER — PREGABALIN 75 MG CAPSULE
ORAL_CAPSULE | 1 refills | 0 days | Status: CP
Start: 2020-12-08 — End: ?

## 2020-12-12 ENCOUNTER — Other Ambulatory Visit: Admit: 2020-12-12 | Discharge: 2020-12-13 | Payer: PRIVATE HEALTH INSURANCE

## 2020-12-12 ENCOUNTER — Encounter: Admit: 2020-12-12 | Discharge: 2020-12-13 | Payer: PRIVATE HEALTH INSURANCE

## 2020-12-12 DIAGNOSIS — Z9484 Stem cells transplant status: Principal | ICD-10-CM

## 2020-12-12 DIAGNOSIS — D849 Immunodeficiency, unspecified: Principal | ICD-10-CM

## 2020-12-12 DIAGNOSIS — C9201 Acute myeloblastic leukemia, in remission: Principal | ICD-10-CM

## 2020-12-12 DIAGNOSIS — Z5181 Encounter for therapeutic drug level monitoring: Principal | ICD-10-CM

## 2020-12-12 MED ORDER — VALACYCLOVIR 500 MG TABLET
ORAL_TABLET | Freq: Every day | ORAL | 0 refills | 30 days
Start: 2020-12-12 — End: 2021-01-11

## 2020-12-12 MED ORDER — DAPSONE 100 MG TABLET
ORAL_TABLET | Freq: Every day | ORAL | 5 refills | 30 days | Status: CP
Start: 2020-12-12 — End: 2021-06-10

## 2020-12-22 MED ORDER — ONDANSETRON 8 MG DISINTEGRATING TABLET
ORAL_TABLET | Freq: Two times a day (BID) | ORAL | 1 refills | 15 days | Status: CP | PRN
Start: 2020-12-22 — End: 2021-12-22

## 2021-01-01 MED ORDER — TRAZODONE 50 MG TABLET
ORAL_TABLET | Freq: Every evening | ORAL | 3 refills | 30 days | Status: CP | PRN
Start: 2021-01-01 — End: 2021-01-31

## 2021-01-04 ENCOUNTER — Other Ambulatory Visit: Admit: 2021-01-04 | Discharge: 2021-01-05 | Payer: PRIVATE HEALTH INSURANCE

## 2021-01-04 ENCOUNTER — Ambulatory Visit
Admit: 2021-01-04 | Discharge: 2021-01-05 | Payer: PRIVATE HEALTH INSURANCE | Attending: Nurse Practitioner | Primary: Nurse Practitioner

## 2021-01-04 ENCOUNTER — Ambulatory Visit: Admit: 2021-01-04 | Discharge: 2021-01-05 | Payer: PRIVATE HEALTH INSURANCE

## 2021-01-04 DIAGNOSIS — Z9484 Stem cells transplant status: Principal | ICD-10-CM

## 2021-01-04 DIAGNOSIS — C9201 Acute myeloblastic leukemia, in remission: Principal | ICD-10-CM

## 2021-01-04 DIAGNOSIS — D84822 Immunocompromised state associated with stem cell transplant (CMS-HCC): Principal | ICD-10-CM

## 2021-01-25 ENCOUNTER — Ambulatory Visit: Admit: 2021-01-25 | Discharge: 2021-01-25 | Payer: PRIVATE HEALTH INSURANCE

## 2021-01-25 ENCOUNTER — Other Ambulatory Visit: Admit: 2021-01-25 | Discharge: 2021-01-25 | Payer: PRIVATE HEALTH INSURANCE

## 2021-01-25 ENCOUNTER — Ambulatory Visit
Admit: 2021-01-25 | Discharge: 2021-01-25 | Payer: PRIVATE HEALTH INSURANCE | Attending: Primary Care | Primary: Primary Care

## 2021-01-25 DIAGNOSIS — Z9484 Stem cells transplant status: Principal | ICD-10-CM

## 2021-01-25 DIAGNOSIS — C9201 Acute myeloblastic leukemia, in remission: Principal | ICD-10-CM

## 2021-01-25 DIAGNOSIS — D84822 Immunocompromised state associated with stem cell transplant (CMS-HCC): Principal | ICD-10-CM

## 2021-01-25 DIAGNOSIS — Z23 Encounter for immunization: Principal | ICD-10-CM

## 2021-01-25 MED ORDER — VALACYCLOVIR 500 MG TABLET
ORAL_TABLET | Freq: Every day | ORAL | 6 refills | 30 days | Status: CP
Start: 2021-01-25 — End: 2021-02-24

## 2021-01-30 MED ORDER — ONDANSETRON 8 MG DISINTEGRATING TABLET
ORAL_TABLET | Freq: Two times a day (BID) | ORAL | 1 refills | 15 days | Status: CP | PRN
Start: 2021-01-30 — End: 2022-01-30

## 2021-02-13 DIAGNOSIS — Z9484 Stem cells transplant status: Principal | ICD-10-CM

## 2021-02-13 DIAGNOSIS — C9201 Acute myeloblastic leukemia, in remission: Principal | ICD-10-CM

## 2021-02-14 ENCOUNTER — Ambulatory Visit
Admit: 2021-02-14 | Discharge: 2021-02-14 | Payer: PRIVATE HEALTH INSURANCE | Attending: Primary Care | Primary: Primary Care

## 2021-02-14 ENCOUNTER — Ambulatory Visit: Admit: 2021-02-14 | Discharge: 2021-02-14 | Payer: PRIVATE HEALTH INSURANCE

## 2021-02-14 ENCOUNTER — Other Ambulatory Visit: Admit: 2021-02-14 | Discharge: 2021-02-14 | Payer: PRIVATE HEALTH INSURANCE

## 2021-02-14 DIAGNOSIS — Z9484 Stem cells transplant status: Principal | ICD-10-CM

## 2021-02-14 DIAGNOSIS — C9201 Acute myeloblastic leukemia, in remission: Principal | ICD-10-CM

## 2021-02-14 DIAGNOSIS — Z23 Encounter for immunization: Principal | ICD-10-CM

## 2021-02-14 MED ORDER — PREGABALIN 75 MG CAPSULE
ORAL_CAPSULE | 1 refills | 0.00000 days | Status: CP
Start: 2021-02-14 — End: ?

## 2021-02-28 MED ORDER — OXYCODONE 5 MG TABLET
ORAL_TABLET | ORAL | 0 refills | 2 days | Status: CP | PRN
Start: 2021-02-28 — End: ?

## 2021-02-28 MED ORDER — CYCLOBENZAPRINE 5 MG TABLET
ORAL_TABLET | Freq: Three times a day (TID) | ORAL | 0 refills | 7 days | Status: CP | PRN
Start: 2021-02-28 — End: ?

## 2021-03-05 DIAGNOSIS — C9201 Acute myeloblastic leukemia, in remission: Principal | ICD-10-CM

## 2021-03-05 MED ORDER — OXYCODONE 5 MG TABLET
ORAL_TABLET | ORAL | 0 refills | 2 days | Status: CP | PRN
Start: 2021-03-05 — End: ?

## 2021-03-05 MED ORDER — CYCLOBENZAPRINE 5 MG TABLET
ORAL_TABLET | Freq: Three times a day (TID) | ORAL | 0 refills | 7 days | Status: CP | PRN
Start: 2021-03-05 — End: ?

## 2021-03-07 ENCOUNTER — Other Ambulatory Visit: Admit: 2021-03-07 | Discharge: 2021-03-08 | Payer: PRIVATE HEALTH INSURANCE

## 2021-03-07 ENCOUNTER — Ambulatory Visit: Admit: 2021-03-07 | Discharge: 2021-03-08 | Payer: PRIVATE HEALTH INSURANCE

## 2021-03-07 DIAGNOSIS — G5793 Unspecified mononeuropathy of bilateral lower limbs: Principal | ICD-10-CM

## 2021-03-07 DIAGNOSIS — Z9484 Stem cells transplant status: Principal | ICD-10-CM

## 2021-03-07 DIAGNOSIS — C9201 Acute myeloblastic leukemia, in remission: Principal | ICD-10-CM

## 2021-03-07 DIAGNOSIS — D84822 Immunocompromised state associated with stem cell transplant (CMS-HCC): Principal | ICD-10-CM

## 2021-03-07 MED ORDER — VALACYCLOVIR 500 MG TABLET
ORAL_TABLET | Freq: Two times a day (BID) | ORAL | 11 refills | 30 days | Status: CP
Start: 2021-03-07 — End: 2022-03-02

## 2021-03-07 MED ORDER — OXYCODONE 5 MG TABLET
ORAL_TABLET | ORAL | 0 refills | 5 days | Status: CP | PRN
Start: 2021-03-07 — End: 2021-03-14

## 2021-03-29 ENCOUNTER — Ambulatory Visit: Admit: 2021-03-29 | Discharge: 2021-03-30 | Payer: PRIVATE HEALTH INSURANCE

## 2021-03-29 ENCOUNTER — Other Ambulatory Visit: Admit: 2021-03-29 | Discharge: 2021-03-30 | Payer: PRIVATE HEALTH INSURANCE

## 2021-03-29 DIAGNOSIS — C9201 Acute myeloblastic leukemia, in remission: Principal | ICD-10-CM

## 2021-03-29 DIAGNOSIS — D61818 Other pancytopenia: Principal | ICD-10-CM

## 2021-03-29 DIAGNOSIS — D84822 Immunocompromised state associated with stem cell transplant (CMS-HCC): Principal | ICD-10-CM

## 2021-03-29 DIAGNOSIS — Z9484 Stem cells transplant status: Principal | ICD-10-CM

## 2021-03-30 MED ORDER — ONDANSETRON 8 MG DISINTEGRATING TABLET
ORAL_TABLET | Freq: Two times a day (BID) | ORAL | 1 refills | 15 days | Status: CP | PRN
Start: 2021-03-30 — End: 2022-03-30

## 2021-04-05 ENCOUNTER — Other Ambulatory Visit: Admit: 2021-04-05 | Discharge: 2021-04-06 | Payer: PRIVATE HEALTH INSURANCE

## 2021-04-05 ENCOUNTER — Ambulatory Visit
Admit: 2021-04-05 | Discharge: 2021-04-06 | Payer: PRIVATE HEALTH INSURANCE | Attending: Student in an Organized Health Care Education/Training Program | Primary: Student in an Organized Health Care Education/Training Program

## 2021-04-05 DIAGNOSIS — D61818 Other pancytopenia: Principal | ICD-10-CM

## 2021-04-05 DIAGNOSIS — C9201 Acute myeloblastic leukemia, in remission: Principal | ICD-10-CM

## 2021-04-05 DIAGNOSIS — Z9484 Stem cells transplant status: Principal | ICD-10-CM

## 2021-04-18 DIAGNOSIS — G5793 Unspecified mononeuropathy of bilateral lower limbs: Principal | ICD-10-CM

## 2021-04-19 ENCOUNTER — Ambulatory Visit: Admit: 2021-04-19 | Discharge: 2021-04-19 | Payer: PRIVATE HEALTH INSURANCE

## 2021-04-19 DIAGNOSIS — Z9484 Stem cells transplant status: Principal | ICD-10-CM

## 2021-04-23 DIAGNOSIS — G5793 Unspecified mononeuropathy of bilateral lower limbs: Principal | ICD-10-CM

## 2021-04-23 MED ORDER — PREGABALIN 75 MG CAPSULE
ORAL_CAPSULE | 0 refills | 0 days | Status: CP
Start: 2021-04-23 — End: ?

## 2021-04-26 ENCOUNTER — Other Ambulatory Visit: Admit: 2021-04-26 | Discharge: 2021-04-27 | Payer: PRIVATE HEALTH INSURANCE

## 2021-04-26 ENCOUNTER — Ambulatory Visit: Admit: 2021-04-26 | Discharge: 2021-04-27 | Payer: PRIVATE HEALTH INSURANCE

## 2021-04-26 DIAGNOSIS — E875 Hyperkalemia: Principal | ICD-10-CM

## 2021-04-26 DIAGNOSIS — Z9484 Stem cells transplant status: Principal | ICD-10-CM

## 2021-04-26 DIAGNOSIS — D84822 Immunocompromised state associated with stem cell transplant (CMS-HCC): Principal | ICD-10-CM

## 2021-04-26 DIAGNOSIS — I10 Essential (primary) hypertension: Principal | ICD-10-CM

## 2021-04-26 DIAGNOSIS — C9201 Acute myeloblastic leukemia, in remission: Principal | ICD-10-CM

## 2021-05-15 MED ORDER — TRAZODONE 50 MG TABLET
ORAL_TABLET | Freq: Every evening | ORAL | 3 refills | 30 days | Status: CP | PRN
Start: 2021-05-15 — End: 2021-06-14

## 2021-05-21 DIAGNOSIS — D849 Immunodeficiency, unspecified: Principal | ICD-10-CM

## 2021-05-22 ENCOUNTER — Other Ambulatory Visit: Admit: 2021-05-22 | Discharge: 2021-05-22 | Payer: PRIVATE HEALTH INSURANCE

## 2021-05-22 ENCOUNTER — Ambulatory Visit: Admit: 2021-05-22 | Discharge: 2021-05-22 | Payer: PRIVATE HEALTH INSURANCE

## 2021-05-22 DIAGNOSIS — C9201 Acute myeloblastic leukemia, in remission: Principal | ICD-10-CM

## 2021-05-22 DIAGNOSIS — Z9484 Stem cells transplant status: Principal | ICD-10-CM

## 2021-05-22 DIAGNOSIS — D849 Immunodeficiency, unspecified: Principal | ICD-10-CM

## 2021-05-22 MED ORDER — ONDANSETRON 8 MG DISINTEGRATING TABLET
ORAL_TABLET | Freq: Two times a day (BID) | ORAL | 1 refills | 15 days | Status: CP | PRN
Start: 2021-05-22 — End: 2022-05-22

## 2021-05-23 DIAGNOSIS — G5793 Unspecified mononeuropathy of bilateral lower limbs: Principal | ICD-10-CM

## 2021-05-23 MED ORDER — PREGABALIN 75 MG CAPSULE
ORAL_CAPSULE | 0 refills | 0 days | Status: CP
Start: 2021-05-23 — End: ?

## 2021-05-23 MED ORDER — ONDANSETRON 8 MG DISINTEGRATING TABLET
ORAL_TABLET | Freq: Two times a day (BID) | ORAL | 1 refills | 15 days | Status: CP | PRN
Start: 2021-05-23 — End: 2022-05-23

## 2021-06-19 DIAGNOSIS — G5793 Unspecified mononeuropathy of bilateral lower limbs: Principal | ICD-10-CM

## 2021-06-19 MED ORDER — PREGABALIN 75 MG CAPSULE
ORAL_CAPSULE | 0 refills | 0 days
Start: 2021-06-19 — End: ?

## 2021-06-20 DIAGNOSIS — G5793 Unspecified mononeuropathy of bilateral lower limbs: Principal | ICD-10-CM

## 2021-06-20 DIAGNOSIS — Z9484 Stem cells transplant status: Principal | ICD-10-CM

## 2021-06-20 MED ORDER — PREGABALIN 75 MG CAPSULE
ORAL_CAPSULE | 0 refills | 0.00000 days | Status: CP
Start: 2021-06-20 — End: ?

## 2021-06-21 MED ORDER — PREGABALIN 75 MG CAPSULE
ORAL_CAPSULE | 0 refills | 0 days
Start: 2021-06-21 — End: ?

## 2021-07-12 ENCOUNTER — Other Ambulatory Visit: Admit: 2021-07-12 | Discharge: 2021-07-13 | Payer: PRIVATE HEALTH INSURANCE

## 2021-07-12 ENCOUNTER — Ambulatory Visit
Admit: 2021-07-12 | Discharge: 2021-07-13 | Payer: PRIVATE HEALTH INSURANCE | Attending: Hematology | Primary: Hematology

## 2021-07-12 DIAGNOSIS — C9201 Acute myeloblastic leukemia, in remission: Principal | ICD-10-CM

## 2021-07-12 DIAGNOSIS — Z9484 Stem cells transplant status: Principal | ICD-10-CM

## 2021-07-13 DIAGNOSIS — G5793 Unspecified mononeuropathy of bilateral lower limbs: Principal | ICD-10-CM

## 2021-07-13 MED ORDER — PREGABALIN 75 MG CAPSULE
ORAL_CAPSULE | 1 refills | 0 days | Status: CP
Start: 2021-07-13 — End: ?

## 2021-08-03 ENCOUNTER — Ambulatory Visit: Admit: 2021-08-03 | Discharge: 2021-08-04 | Payer: PRIVATE HEALTH INSURANCE

## 2021-08-03 DIAGNOSIS — R4589 Other symptoms and signs involving emotional state: Principal | ICD-10-CM

## 2021-08-03 DIAGNOSIS — M940 Chondrocostal junction syndrome [Tietze]: Principal | ICD-10-CM

## 2021-08-03 DIAGNOSIS — Z1211 Encounter for screening for malignant neoplasm of colon: Principal | ICD-10-CM

## 2021-08-03 DIAGNOSIS — R7989 Other specified abnormal findings of blood chemistry: Principal | ICD-10-CM

## 2021-08-03 MED ORDER — TESTOSTERONE 2% IN DERMABASE TOPICAL CREAM
Freq: Every day | TOPICAL | 3 refills | 0 days | Status: CP
Start: 2021-08-03 — End: 2021-11-01

## 2021-08-16 DIAGNOSIS — N529 Male erectile dysfunction, unspecified: Principal | ICD-10-CM

## 2021-08-16 DIAGNOSIS — R7989 Other specified abnormal findings of blood chemistry: Principal | ICD-10-CM

## 2021-08-16 MED ORDER — DULOXETINE 30 MG CAPSULE,DELAYED RELEASE
ORAL_CAPSULE | Freq: Every day | ORAL | 3 refills | 180 days | Status: CP
Start: 2021-08-16 — End: 2022-08-16

## 2021-08-22 ENCOUNTER — Ambulatory Visit: Admit: 2021-08-22 | Discharge: 2021-08-22 | Payer: PRIVATE HEALTH INSURANCE

## 2021-08-22 ENCOUNTER — Other Ambulatory Visit: Admit: 2021-08-22 | Discharge: 2021-08-22 | Payer: PRIVATE HEALTH INSURANCE

## 2021-08-22 DIAGNOSIS — D849 Immunodeficiency, unspecified: Principal | ICD-10-CM

## 2021-08-22 DIAGNOSIS — Z9484 Stem cells transplant status: Principal | ICD-10-CM

## 2021-08-23 DIAGNOSIS — E291 Testicular hypofunction: Principal | ICD-10-CM

## 2021-08-23 DIAGNOSIS — C9201 Acute myeloblastic leukemia, in remission: Principal | ICD-10-CM

## 2021-08-23 DIAGNOSIS — R7989 Other specified abnormal findings of blood chemistry: Principal | ICD-10-CM

## 2021-08-23 MED ORDER — TESTOSTERONE 2% IN DERMABASE TOPICAL CREAM
Freq: Every day | TOPICAL | 3 refills | 0 days | Status: CP
Start: 2021-08-23 — End: ?

## 2021-08-27 DIAGNOSIS — E291 Testicular hypofunction: Principal | ICD-10-CM

## 2021-08-27 MED ORDER — TESTOSTERONE (BULK) POWDER
0 refills | 0 days | Status: CP
Start: 2021-08-27 — End: ?

## 2021-09-10 ENCOUNTER — Telehealth: Admit: 2021-09-10 | Discharge: 2021-09-11 | Payer: PRIVATE HEALTH INSURANCE

## 2021-09-10 DIAGNOSIS — C9201 Acute myeloblastic leukemia, in remission: Principal | ICD-10-CM

## 2021-09-11 DIAGNOSIS — G5793 Unspecified mononeuropathy of bilateral lower limbs: Principal | ICD-10-CM

## 2021-09-11 MED ORDER — PREGABALIN 75 MG CAPSULE
ORAL_CAPSULE | 1 refills | 0 days
Start: 2021-09-11 — End: ?

## 2021-09-11 MED ORDER — TRAZODONE 50 MG TABLET
ORAL_TABLET | Freq: Every evening | ORAL | 3 refills | 0.00000 days | PRN
Start: 2021-09-11 — End: 2021-10-11

## 2021-09-12 MED ORDER — PREGABALIN 75 MG CAPSULE
ORAL_CAPSULE | 1 refills | 0 days | Status: CP
Start: 2021-09-12 — End: ?

## 2021-09-12 MED ORDER — TRAZODONE 50 MG TABLET
ORAL_TABLET | Freq: Every evening | ORAL | 3 refills | 30 days | PRN
Start: 2021-09-12 — End: 2021-10-12

## 2021-09-13 MED ORDER — TRAZODONE 50 MG TABLET
ORAL_TABLET | Freq: Every evening | ORAL | 3 refills | 30 days | Status: CP | PRN
Start: 2021-09-13 — End: 2022-01-11

## 2021-09-17 MED ORDER — PREGABALIN 75 MG CAPSULE
ORAL_CAPSULE | 1 refills | 0 days | Status: CP
Start: 2021-09-17 — End: ?

## 2021-09-17 MED ORDER — TRAZODONE 50 MG TABLET
ORAL_TABLET | Freq: Every evening | ORAL | 3 refills | 30 days | Status: CP | PRN
Start: 2021-09-17 — End: 2022-01-15

## 2021-10-05 MED ORDER — TRAZODONE 50 MG TABLET
ORAL_TABLET | Freq: Every evening | ORAL | 2 refills | 0 days | PRN
Start: 2021-10-05 — End: ?

## 2021-10-08 MED ORDER — TRAZODONE 50 MG TABLET
ORAL_TABLET | Freq: Every evening | ORAL | 2 refills | 45 days | Status: CP | PRN
Start: 2021-10-08 — End: 2022-02-05

## 2021-10-09 ENCOUNTER — Ambulatory Visit
Admit: 2021-10-09 | Discharge: 2021-10-09 | Payer: PRIVATE HEALTH INSURANCE | Attending: Adult Health | Primary: Adult Health

## 2021-10-09 ENCOUNTER — Other Ambulatory Visit: Admit: 2021-10-09 | Discharge: 2021-10-09 | Payer: PRIVATE HEALTH INSURANCE

## 2021-10-09 DIAGNOSIS — C9201 Acute myeloblastic leukemia, in remission: Principal | ICD-10-CM

## 2021-10-19 MED ORDER — ONDANSETRON 8 MG DISINTEGRATING TABLET
ORAL_TABLET | Freq: Two times a day (BID) | ORAL | 1 refills | 15 days | Status: CP | PRN
Start: 2021-10-19 — End: 2022-10-19

## 2021-11-12 DIAGNOSIS — G5793 Unspecified mononeuropathy of bilateral lower limbs: Principal | ICD-10-CM

## 2021-11-12 MED ORDER — TRAZODONE 50 MG TABLET
ORAL_TABLET | Freq: Every evening | ORAL | 2 refills | 45 days | Status: CN | PRN
Start: 2021-11-12 — End: 2022-03-12

## 2021-11-12 MED ORDER — PREGABALIN 75 MG CAPSULE
ORAL_CAPSULE | 1 refills | 0 days | Status: CP
Start: 2021-11-12 — End: ?

## 2021-11-19 MED ORDER — TRAZODONE 50 MG TABLET
ORAL_TABLET | Freq: Every evening | ORAL | 2 refills | 0 days | PRN
Start: 2021-11-19 — End: ?

## 2021-11-27 ENCOUNTER — Ambulatory Visit: Admit: 2021-11-27 | Discharge: 2021-11-28 | Payer: PRIVATE HEALTH INSURANCE

## 2021-11-27 DIAGNOSIS — R7989 Other specified abnormal findings of blood chemistry: Principal | ICD-10-CM

## 2021-11-27 DIAGNOSIS — N529 Male erectile dysfunction, unspecified: Principal | ICD-10-CM

## 2021-11-27 DIAGNOSIS — Z125 Encounter for screening for malignant neoplasm of prostate: Principal | ICD-10-CM

## 2021-11-28 MED ORDER — TRAZODONE 50 MG TABLET
ORAL_TABLET | Freq: Every evening | ORAL | 2 refills | 45 days | Status: CP | PRN
Start: 2021-11-28 — End: 2022-03-28

## 2021-12-12 MED ORDER — ONDANSETRON 8 MG DISINTEGRATING TABLET
ORAL_TABLET | Freq: Two times a day (BID) | ORAL | 3 refills | 15 days | Status: CP | PRN
Start: 2021-12-12 — End: 2022-12-12

## 2022-01-02 ENCOUNTER — Other Ambulatory Visit: Admit: 2022-01-02 | Discharge: 2022-01-02 | Payer: PRIVATE HEALTH INSURANCE

## 2022-01-02 ENCOUNTER — Ambulatory Visit
Admit: 2022-01-02 | Discharge: 2022-01-02 | Payer: PRIVATE HEALTH INSURANCE | Attending: Hematology | Primary: Hematology

## 2022-01-02 DIAGNOSIS — C9201 Acute myeloblastic leukemia, in remission: Principal | ICD-10-CM

## 2022-03-12 ENCOUNTER — Ambulatory Visit: Admit: 2022-03-12 | Discharge: 2022-03-13 | Payer: PRIVATE HEALTH INSURANCE

## 2022-03-12 DIAGNOSIS — N529 Male erectile dysfunction, unspecified: Principal | ICD-10-CM

## 2022-03-12 DIAGNOSIS — Z125 Encounter for screening for malignant neoplasm of prostate: Principal | ICD-10-CM

## 2022-03-12 DIAGNOSIS — E291 Testicular hypofunction: Principal | ICD-10-CM

## 2022-03-12 MED ORDER — TESTOSTERONE (BULK) POWDER
5 refills | 0 days | Status: CN
Start: 2022-03-12 — End: ?

## 2022-03-15 DIAGNOSIS — G5793 Unspecified mononeuropathy of bilateral lower limbs: Principal | ICD-10-CM

## 2022-03-15 MED ORDER — PREGABALIN 75 MG CAPSULE
ORAL_CAPSULE | 1 refills | 0 days | Status: CP
Start: 2022-03-15 — End: ?

## 2022-03-18 ENCOUNTER — Ambulatory Visit: Admit: 2022-03-18 | Discharge: 2022-03-19 | Payer: PRIVATE HEALTH INSURANCE

## 2022-03-18 DIAGNOSIS — E291 Testicular hypofunction: Principal | ICD-10-CM

## 2022-04-17 DIAGNOSIS — C9201 Acute myeloblastic leukemia, in remission: Principal | ICD-10-CM

## 2022-04-17 MED ORDER — ONDANSETRON 8 MG DISINTEGRATING TABLET
ORAL_TABLET | Freq: Two times a day (BID) | ORAL | 3 refills | 15 days | Status: CP | PRN
Start: 2022-04-17 — End: 2023-04-17

## 2022-05-14 DIAGNOSIS — G5793 Unspecified mononeuropathy of bilateral lower limbs: Principal | ICD-10-CM

## 2022-05-14 MED ORDER — PREGABALIN 75 MG CAPSULE
ORAL_CAPSULE | 1 refills | 0 days
Start: 2022-05-14 — End: ?

## 2022-05-15 DIAGNOSIS — G5793 Unspecified mononeuropathy of bilateral lower limbs: Principal | ICD-10-CM

## 2022-05-15 MED ORDER — PREGABALIN 75 MG CAPSULE
ORAL_CAPSULE | 1 refills | 0 days | Status: CP
Start: 2022-05-15 — End: ?

## 2022-05-28 MED ORDER — TRAZODONE 50 MG TABLET
ORAL_TABLET | Freq: Every evening | ORAL | 2 refills | 0 days | PRN
Start: 2022-05-28 — End: ?

## 2022-05-29 MED ORDER — TRAZODONE 50 MG TABLET
ORAL_TABLET | Freq: Every evening | ORAL | 2 refills | 45 days | Status: CP | PRN
Start: 2022-05-29 — End: 2022-09-26

## 2022-06-12 MED ORDER — TRAZODONE 50 MG TABLET
ORAL_TABLET | Freq: Every evening | ORAL | 2 refills | 0 days | PRN
Start: 2022-06-12 — End: ?

## 2022-06-17 MED ORDER — TRAZODONE 50 MG TABLET
ORAL_TABLET | Freq: Every evening | ORAL | 2 refills | 45 days | Status: CP | PRN
Start: 2022-06-17 — End: 2022-10-15

## 2022-07-16 ENCOUNTER — Other Ambulatory Visit: Payer: Self-pay

## 2022-07-16 DIAGNOSIS — G5793 Unspecified mononeuropathy of bilateral lower limbs: Principal | ICD-10-CM

## 2022-07-16 MED ORDER — PREGABALIN 75 MG CAPSULE
ORAL_CAPSULE | 1 refills | 0 days | Status: CP
Start: 2022-07-16 — End: ?

## 2022-07-16 NOTE — Progress Notes (Signed)
UDS completed for [employer] pre employment.

## 2022-07-24 ENCOUNTER — Other Ambulatory Visit: Admit: 2022-07-24 | Discharge: 2022-07-24 | Payer: PRIVATE HEALTH INSURANCE

## 2022-07-24 ENCOUNTER — Ambulatory Visit: Admit: 2022-07-24 | Discharge: 2022-07-24 | Payer: MEDICARE | Attending: Hematology | Primary: Hematology

## 2022-07-24 DIAGNOSIS — C9201 Acute myeloblastic leukemia, in remission: Principal | ICD-10-CM

## 2022-07-24 DIAGNOSIS — E291 Testicular hypofunction: Principal | ICD-10-CM

## 2022-07-30 NOTE — Progress Notes (Signed)
07/29/22 Richardson Landry brought a copy of immunizations. Received Hep B vaccine series (08/22/21, 02/14/21, 08/17/20) Epic has last Tdap as 01/12/2015  AMD

## 2022-08-13 ENCOUNTER — Ambulatory Visit: Admit: 2022-08-13 | Discharge: 2022-08-14 | Payer: MEDICARE

## 2022-09-05 MED ORDER — VALACYCLOVIR 500 MG TABLET
ORAL_TABLET | Freq: Two times a day (BID) | ORAL | 3 refills | 0 days
Start: 2022-09-05 — End: ?

## 2022-09-05 MED ORDER — DULOXETINE 30 MG CAPSULE,DELAYED RELEASE
ORAL_CAPSULE | Freq: Every day | ORAL | 7 refills | 0 days
Start: 2022-09-05 — End: ?

## 2022-09-11 MED ORDER — VALACYCLOVIR 500 MG TABLET
ORAL_TABLET | Freq: Two times a day (BID) | ORAL | 3 refills | 90 days
Start: 2022-09-11 — End: ?

## 2022-09-13 MED ORDER — DULOXETINE 30 MG CAPSULE,DELAYED RELEASE
ORAL_CAPSULE | Freq: Every day | ORAL | 7 refills | 90 days | Status: CP
Start: 2022-09-13 — End: ?

## 2022-09-16 DIAGNOSIS — G5793 Unspecified mononeuropathy of bilateral lower limbs: Principal | ICD-10-CM

## 2022-09-16 MED ORDER — PREGABALIN 75 MG CAPSULE
ORAL_CAPSULE | 1 refills | 0 days
Start: 2022-09-16 — End: ?

## 2022-09-17 MED ORDER — PREGABALIN 75 MG CAPSULE
ORAL_CAPSULE | 1 refills | 0 days | Status: CP
Start: 2022-09-17 — End: ?

## 2022-10-09 MED ORDER — TESTOSTERONE (BULK) POWDER
0 refills | 0 days
Start: 2022-10-09 — End: ?

## 2022-10-11 MED ORDER — TESTOSTERONE (BULK) POWDER
0 refills | 0 days
Start: 2022-10-11 — End: ?

## 2022-10-24 ENCOUNTER — Other Ambulatory Visit: Admit: 2022-10-24 | Discharge: 2022-10-25 | Payer: MEDICARE

## 2022-10-24 ENCOUNTER — Ambulatory Visit: Admit: 2022-10-24 | Discharge: 2022-10-25 | Payer: MEDICARE | Attending: Adult Health | Primary: Adult Health

## 2022-10-24 DIAGNOSIS — C9201 Acute myeloblastic leukemia, in remission: Principal | ICD-10-CM

## 2022-10-24 MED ORDER — DULOXETINE 60 MG CAPSULE,DELAYED RELEASE
ORAL_CAPSULE | Freq: Every day | ORAL | 6 refills | 30 days | Status: CP
Start: 2022-10-24 — End: ?

## 2022-11-10 MED ORDER — TRAZODONE 50 MG TABLET
ORAL_TABLET | Freq: Every evening | ORAL | 1 refills | 0 days | PRN
Start: 2022-11-10 — End: ?

## 2022-11-13 MED ORDER — TRAZODONE 50 MG TABLET
ORAL_TABLET | Freq: Every evening | ORAL | 1 refills | 90 days | Status: CP | PRN
Start: 2022-11-13 — End: 2023-03-13

## 2022-11-15 DIAGNOSIS — G5793 Unspecified mononeuropathy of bilateral lower limbs: Principal | ICD-10-CM

## 2022-11-15 MED ORDER — PREGABALIN 75 MG CAPSULE
ORAL_CAPSULE | 1 refills | 0 days
Start: 2022-11-15 — End: ?

## 2022-11-17 DIAGNOSIS — G5793 Unspecified mononeuropathy of bilateral lower limbs: Principal | ICD-10-CM

## 2022-11-17 MED ORDER — PREGABALIN 75 MG CAPSULE
ORAL_CAPSULE | 1 refills | 0 days | Status: CP
Start: 2022-11-17 — End: ?

## 2022-12-20 ENCOUNTER — Ambulatory Visit: Admit: 2022-12-20 | Discharge: 2022-12-21 | Payer: MEDICARE

## 2022-12-20 DIAGNOSIS — R634 Abnormal weight loss: Principal | ICD-10-CM

## 2022-12-20 DIAGNOSIS — F419 Anxiety disorder, unspecified: Principal | ICD-10-CM

## 2022-12-20 DIAGNOSIS — Z1211 Encounter for screening for malignant neoplasm of colon: Principal | ICD-10-CM

## 2022-12-20 DIAGNOSIS — G5793 Unspecified mononeuropathy of bilateral lower limbs: Principal | ICD-10-CM

## 2022-12-20 DIAGNOSIS — D7589 Other specified diseases of blood and blood-forming organs: Principal | ICD-10-CM

## 2022-12-20 DIAGNOSIS — Z87891 Personal history of nicotine dependence: Principal | ICD-10-CM

## 2022-12-20 DIAGNOSIS — E291 Testicular hypofunction: Principal | ICD-10-CM

## 2022-12-20 MED ORDER — DULOXETINE 30 MG CAPSULE,DELAYED RELEASE
ORAL_CAPSULE | Freq: Every day | ORAL | 11 refills | 30 days | Status: CP
Start: 2022-12-20 — End: ?

## 2023-01-09 DIAGNOSIS — G5793 Unspecified mononeuropathy of bilateral lower limbs: Principal | ICD-10-CM

## 2023-01-09 MED ORDER — PREGABALIN 75 MG CAPSULE
ORAL_CAPSULE | 1 refills | 0 days
Start: 2023-01-09 — End: ?

## 2023-01-10 MED ORDER — PREGABALIN 75 MG CAPSULE
ORAL_CAPSULE | 1 refills | 0 days
Start: 2023-01-10 — End: ?

## 2023-01-13 DIAGNOSIS — G5793 Unspecified mononeuropathy of bilateral lower limbs: Principal | ICD-10-CM

## 2023-01-13 MED ORDER — PREGABALIN 75 MG CAPSULE
ORAL_CAPSULE | Freq: Two times a day (BID) | ORAL | 7 refills | 45 days
Start: 2023-01-13 — End: ?

## 2023-01-14 MED ORDER — PREGABALIN 75 MG CAPSULE
ORAL_CAPSULE | Freq: Two times a day (BID) | ORAL | 7 refills | 45 days | Status: CP
Start: 2023-01-14 — End: ?

## 2023-01-17 MED ORDER — PREGABALIN 150 MG CAPSULE
ORAL_CAPSULE | Freq: Two times a day (BID) | ORAL | 11 refills | 15 days | Status: CP
Start: 2023-01-17 — End: ?

## 2023-02-11 MED ORDER — PREGABALIN 75 MG CAPSULE
ORAL_CAPSULE | Freq: Two times a day (BID) | ORAL | 3 refills | 30 days | Status: CP
Start: 2023-02-11 — End: ?

## 2023-03-21 MED ORDER — TESTOSTERONE (BULK) POWDER
3 refills | 0 days | Status: CP
Start: 2023-03-21 — End: ?

## 2023-04-05 MED ORDER — TRAZODONE 50 MG TABLET
ORAL_TABLET | Freq: Every evening | ORAL | 1 refills | 90 days | PRN
Start: 2023-04-05 — End: 2023-08-03

## 2023-04-07 DIAGNOSIS — C9201 Acute myeloblastic leukemia, in remission: Principal | ICD-10-CM

## 2023-04-24 DIAGNOSIS — C9201 Acute myeloblastic leukemia, in remission: Principal | ICD-10-CM

## 2023-04-30 DIAGNOSIS — C9201 Acute myeloblastic leukemia, in remission: Principal | ICD-10-CM

## 2023-05-29 DIAGNOSIS — C9201 Acute myeloblastic leukemia, in remission: Principal | ICD-10-CM

## 2023-06-09 DIAGNOSIS — G5793 Unspecified mononeuropathy of bilateral lower limbs: Principal | ICD-10-CM

## 2023-06-09 MED ORDER — PREGABALIN 75 MG CAPSULE
ORAL_CAPSULE | Freq: Two times a day (BID) | ORAL | 3 refills | 0.00 days
Start: 2023-06-09 — End: ?

## 2023-06-10 DIAGNOSIS — C9201 Acute myeloblastic leukemia, in remission: Principal | ICD-10-CM

## 2023-06-10 MED ORDER — PREGABALIN 75 MG CAPSULE
ORAL_CAPSULE | Freq: Two times a day (BID) | ORAL | 3 refills | 30.00 days
Start: 2023-06-10 — End: ?

## 2023-07-24 ENCOUNTER — Other Ambulatory Visit: Admit: 2023-07-24 | Discharge: 2023-07-25 | Payer: MEDICARE

## 2023-07-24 ENCOUNTER — Ambulatory Visit: Admit: 2023-07-24 | Discharge: 2023-07-25 | Payer: MEDICARE | Attending: Adult Health | Primary: Adult Health

## 2023-07-24 DIAGNOSIS — C9201 Acute myeloblastic leukemia, in remission: Principal | ICD-10-CM

## 2023-07-25 DIAGNOSIS — C9201 Acute myeloblastic leukemia, in remission: Principal | ICD-10-CM

## 2023-10-02 DIAGNOSIS — G5793 Unspecified mononeuropathy of bilateral lower limbs: Principal | ICD-10-CM

## 2023-10-02 MED ORDER — PREGABALIN 75 MG CAPSULE
ORAL_CAPSULE | Freq: Two times a day (BID) | ORAL | 3 refills | 0.00000 days
Start: 2023-10-02 — End: ?

## 2023-10-02 MED ORDER — DULOXETINE 30 MG CAPSULE,DELAYED RELEASE
ORAL_CAPSULE | Freq: Every day | ORAL | 5 refills | 0.00000 days
Start: 2023-10-02 — End: ?

## 2023-10-03 DIAGNOSIS — C9201 Acute myeloblastic leukemia, in remission: Principal | ICD-10-CM

## 2023-10-03 MED ORDER — DULOXETINE 30 MG CAPSULE,DELAYED RELEASE
ORAL_CAPSULE | Freq: Every day | ORAL | 5 refills | 90.00000 days | Status: CP
Start: 2023-10-03 — End: ?

## 2023-10-03 MED ORDER — PREGABALIN 75 MG CAPSULE
ORAL_CAPSULE | Freq: Two times a day (BID) | ORAL | 3 refills | 30.00000 days | Status: CP
Start: 2023-10-03 — End: ?

## 2023-10-09 ENCOUNTER — Inpatient Hospital Stay: Admit: 2023-10-09 | Discharge: 2023-10-10 | Payer: MEDICARE

## 2023-10-15 ENCOUNTER — Ambulatory Visit: Admit: 2023-10-15 | Discharge: 2023-10-16 | Payer: MEDICARE

## 2023-10-15 DIAGNOSIS — N529 Male erectile dysfunction, unspecified: Principal | ICD-10-CM

## 2023-10-15 DIAGNOSIS — E291 Testicular hypofunction: Principal | ICD-10-CM

## 2023-10-15 DIAGNOSIS — Z125 Encounter for screening for malignant neoplasm of prostate: Principal | ICD-10-CM

## 2023-10-15 MED ORDER — SILDENAFIL 100 MG TABLET
ORAL_TABLET | Freq: Every day | ORAL | 0 refills | 120.00000 days | Status: CP | PRN
Start: 2023-10-15 — End: ?

## 2023-10-16 DIAGNOSIS — C9201 Acute myeloblastic leukemia, in remission: Principal | ICD-10-CM

## 2023-10-16 MED ORDER — TESTOSTERONE (BULK) POWDER
INTRAMUSCULAR | 0 refills | 0.00000 days | Status: CP
Start: 2023-10-16 — End: ?

## 2023-10-20 DIAGNOSIS — E291 Testicular hypofunction: Principal | ICD-10-CM

## 2023-10-20 MED ORDER — TESTOSTERONE (BULK) POWDER
INTRAMUSCULAR | 0 refills | 0.00000 days | Status: CP
Start: 2023-10-20 — End: ?

## 2023-10-23 MED ORDER — TRAZODONE 50 MG TABLET
ORAL_TABLET | Freq: Every evening | ORAL | 0 refills | 90.00000 days | Status: CP | PRN
Start: 2023-10-23 — End: 2024-01-21

## 2023-12-14 DIAGNOSIS — C9201 Acute myeloblastic leukemia, in remission: Principal | ICD-10-CM

## 2023-12-16 DIAGNOSIS — C9201 Acute myeloblastic leukemia, in remission: Principal | ICD-10-CM

## 2023-12-19 ENCOUNTER — Encounter: Admit: 2023-12-19 | Discharge: 2023-12-19 | Payer: MEDICARE

## 2023-12-19 DIAGNOSIS — G5793 Unspecified mononeuropathy of bilateral lower limbs: Principal | ICD-10-CM

## 2023-12-19 DIAGNOSIS — R7989 Other specified abnormal findings of blood chemistry: Principal | ICD-10-CM

## 2023-12-19 DIAGNOSIS — Z131 Encounter for screening for diabetes mellitus: Principal | ICD-10-CM

## 2023-12-19 DIAGNOSIS — R718 Other abnormality of red blood cells: Principal | ICD-10-CM

## 2023-12-19 DIAGNOSIS — I2510 Atherosclerotic heart disease of native coronary artery without angina pectoris: Principal | ICD-10-CM

## 2023-12-19 DIAGNOSIS — R109 Unspecified abdominal pain: Principal | ICD-10-CM

## 2023-12-19 DIAGNOSIS — R110 Nausea: Principal | ICD-10-CM

## 2023-12-19 DIAGNOSIS — E039 Hypothyroidism, unspecified: Principal | ICD-10-CM

## 2023-12-19 MED ORDER — PREGABALIN 75 MG CAPSULE
ORAL_CAPSULE | Freq: Two times a day (BID) | ORAL | 3 refills | 30.00000 days | Status: CP
Start: 2023-12-19 — End: ?

## 2023-12-19 MED ORDER — ONDANSETRON 8 MG DISINTEGRATING TABLET
ORAL_TABLET | Freq: Two times a day (BID) | ORAL | 1 refills | 15.00000 days | Status: CP | PRN
Start: 2023-12-19 — End: 2024-12-18

## 2023-12-23 MED ORDER — PEG 3350-ELECTROLYTES 236 GRAM-22.74 GRAM-6.74 GRAM-5.86 GRAM SOLUTION
0 refills | 0.00000 days | Status: CP
Start: 2023-12-23 — End: ?

## 2023-12-26 MED ORDER — TRAZODONE 50 MG TABLET
ORAL_TABLET | Freq: Every evening | ORAL | 3 refills | 90.00000 days | Status: CP | PRN
Start: 2023-12-26 — End: 2024-03-25

## 2023-12-27 DIAGNOSIS — C9201 Acute myeloblastic leukemia, in remission: Principal | ICD-10-CM

## 2023-12-27 MED ORDER — PANTOPRAZOLE 40 MG TABLET,DELAYED RELEASE
ORAL_TABLET | Freq: Every day | ORAL | 3 refills | 30.00000 days | Status: CP
Start: 2023-12-27 — End: 2024-12-26

## 2024-01-01 ENCOUNTER — Inpatient Hospital Stay: Admit: 2024-01-01 | Discharge: 2024-01-01 | Payer: MEDICARE

## 2024-01-02 DIAGNOSIS — C9201 Acute myeloblastic leukemia, in remission: Principal | ICD-10-CM

## 2024-01-02 MED ORDER — TESTOSTERONE MICRONIZED (BULK) 100 % POWDER
4 refills | 0.00000 days
Start: 2024-01-02 — End: ?

## 2024-01-05 DIAGNOSIS — R110 Nausea: Principal | ICD-10-CM

## 2024-01-05 MED ORDER — METOCLOPRAMIDE 5 MG TABLET
ORAL_TABLET | Freq: Three times a day (TID) | ORAL | 1 refills | 30.00000 days | Status: CP
Start: 2024-01-05 — End: 2025-01-04

## 2024-01-05 MED ORDER — TESTOSTERONE MICRONIZED (BULK) 100 % POWDER
4 refills | 0.00000 days
Start: 2024-01-05 — End: ?

## 2024-01-07 DIAGNOSIS — E291 Testicular hypofunction: Principal | ICD-10-CM

## 2024-01-07 MED ORDER — TESTOSTERONE (BULK) POWDER
0 refills | 0.00000 days
Start: 2024-01-07 — End: ?

## 2024-01-09 MED ORDER — TESTOSTERONE (BULK) POWDER
0 refills | 0.00000 days
Start: 2024-01-09 — End: ?

## 2024-01-13 ENCOUNTER — Encounter: Admit: 2024-01-13 | Discharge: 2024-01-14 | Payer: MEDICARE

## 2024-01-13 ENCOUNTER — Ambulatory Visit: Admit: 2024-01-13 | Discharge: 2024-01-14 | Payer: MEDICARE

## 2024-01-14 DIAGNOSIS — C9201 Acute myeloblastic leukemia, in remission: Principal | ICD-10-CM

## 2024-01-16 ENCOUNTER — Ambulatory Visit: Admit: 2024-01-16 | Discharge: 2024-01-17 | Payer: MEDICARE

## 2024-01-16 DIAGNOSIS — E291 Testicular hypofunction: Principal | ICD-10-CM

## 2024-01-16 MED ORDER — TESTOSTERONE (BULK) POWDER
3 refills | 0.00000 days | Status: CP
Start: 2024-01-16 — End: ?

## 2024-01-21 DIAGNOSIS — C9201 Acute myeloblastic leukemia, in remission: Principal | ICD-10-CM

## 2024-01-21 DIAGNOSIS — G5793 Unspecified mononeuropathy of bilateral lower limbs: Principal | ICD-10-CM

## 2024-01-21 MED ORDER — PREGABALIN 75 MG CAPSULE
ORAL_CAPSULE | Freq: Two times a day (BID) | ORAL | 3 refills | 30.00000 days
Start: 2024-01-21 — End: ?

## 2024-01-22 ENCOUNTER — Ambulatory Visit: Admit: 2024-01-22 | Payer: MEDICARE | Attending: Adult Health | Primary: Adult Health

## 2024-01-22 ENCOUNTER — Ambulatory Visit: Admit: 2024-01-22 | Payer: MEDICARE

## 2024-01-22 DIAGNOSIS — C9201 Acute myeloblastic leukemia, in remission: Principal | ICD-10-CM

## 2024-01-22 MED ORDER — PREGABALIN 75 MG CAPSULE
ORAL_CAPSULE | Freq: Two times a day (BID) | ORAL | 3 refills | 30.00000 days | Status: CP
Start: 2024-01-22 — End: ?

## 2024-01-23 DIAGNOSIS — C9201 Acute myeloblastic leukemia, in remission: Principal | ICD-10-CM

## 2024-01-23 MED ORDER — PREGABALIN 75 MG CAPSULE
ORAL_CAPSULE | Freq: Two times a day (BID) | ORAL | 3 refills | 30.00000 days
Start: 2024-01-23 — End: ?

## 2024-02-03 DIAGNOSIS — C9201 Acute myeloblastic leukemia, in remission: Principal | ICD-10-CM

## 2024-02-18 DIAGNOSIS — C9201 Acute myeloblastic leukemia, in remission: Principal | ICD-10-CM

## 2024-03-09 DIAGNOSIS — C9201 Acute myeloblastic leukemia, in remission: Principal | ICD-10-CM

## 2024-03-10 DIAGNOSIS — C9201 Acute myeloblastic leukemia, in remission: Principal | ICD-10-CM

## 2024-03-11 DIAGNOSIS — C9201 Acute myeloblastic leukemia, in remission: Principal | ICD-10-CM

## 2024-04-07 ENCOUNTER — Other Ambulatory Visit: Admit: 2024-04-07 | Discharge: 2024-04-08 | Payer: MEDICARE

## 2024-04-07 ENCOUNTER — Ambulatory Visit: Admit: 2024-04-07 | Discharge: 2024-04-08 | Payer: MEDICARE | Attending: Hematology | Primary: Hematology

## 2024-04-07 DIAGNOSIS — C9201 Acute myeloblastic leukemia, in remission: Principal | ICD-10-CM

## 2024-04-07 DIAGNOSIS — E291 Testicular hypofunction: Principal | ICD-10-CM

## 2024-04-09 ENCOUNTER — Ambulatory Visit: Admit: 2024-04-09 | Discharge: 2024-04-10 | Payer: MEDICARE

## 2024-04-09 DIAGNOSIS — E291 Testicular hypofunction: Principal | ICD-10-CM

## 2024-04-09 MED ORDER — TESTOSTERONE (BULK) POWDER
3 refills | 0.00000 days | Status: CP
Start: 2024-04-09 — End: ?

## 2024-04-11 DIAGNOSIS — C9201 Acute myeloblastic leukemia, in remission: Principal | ICD-10-CM

## 2024-04-27 MED ORDER — ONDANSETRON 8 MG DISINTEGRATING TABLET
ORAL_TABLET | Freq: Two times a day (BID) | ORAL | 1 refills | 15.00000 days | PRN
Start: 2024-04-27 — End: 2025-04-27

## 2024-04-29 DIAGNOSIS — C9201 Acute myeloblastic leukemia, in remission: Principal | ICD-10-CM

## 2024-04-29 MED ORDER — ONDANSETRON 8 MG DISINTEGRATING TABLET
ORAL_TABLET | Freq: Two times a day (BID) | ORAL | 1 refills | 15.00000 days | Status: CP | PRN
Start: 2024-04-29 — End: 2025-04-29

## 2024-05-21 DIAGNOSIS — G5793 Unspecified mononeuropathy of bilateral lower limbs: Principal | ICD-10-CM

## 2024-05-21 MED ORDER — PREGABALIN 150 MG CAPSULE
ORAL_CAPSULE | Freq: Two times a day (BID) | ORAL | 2 refills | 30.00000 days | Status: CP
Start: 2024-05-21 — End: ?

## 2024-05-31 DIAGNOSIS — E291 Testicular hypofunction: Principal | ICD-10-CM

## 2024-05-31 MED ORDER — TESTOSTERONE (BULK) POWDER
3 refills | 0.00000 days | Status: CN
Start: 2024-05-31 — End: ?

## 2024-06-03 DIAGNOSIS — C9201 Acute myeloblastic leukemia, in remission: Principal | ICD-10-CM

## 2024-06-07 DIAGNOSIS — C9201 Acute myeloblastic leukemia, in remission: Principal | ICD-10-CM
